# Patient Record
Sex: Female | Born: 1945
Health system: Southern US, Community
[De-identification: ages and names within clinical notes are randomized; demographics above are authoritative.]

## PROBLEM LIST (undated history)

## (undated) DIAGNOSIS — F32A Depression, unspecified: Secondary | ICD-10-CM

## (undated) DIAGNOSIS — I341 Nonrheumatic mitral (valve) prolapse: Secondary | ICD-10-CM

## (undated) DIAGNOSIS — E785 Hyperlipidemia, unspecified: Secondary | ICD-10-CM

## (undated) DIAGNOSIS — R011 Cardiac murmur, unspecified: Secondary | ICD-10-CM

## (undated) DIAGNOSIS — M722 Plantar fascial fibromatosis: Secondary | ICD-10-CM

## (undated) DIAGNOSIS — K219 Gastro-esophageal reflux disease without esophagitis: Secondary | ICD-10-CM

## (undated) DIAGNOSIS — M1990 Unspecified osteoarthritis, unspecified site: Secondary | ICD-10-CM

## (undated) DIAGNOSIS — I10 Essential (primary) hypertension: Secondary | ICD-10-CM

## (undated) DIAGNOSIS — I700 Atherosclerosis of aorta: Secondary | ICD-10-CM

## (undated) DIAGNOSIS — F329 Major depressive disorder, single episode, unspecified: Secondary | ICD-10-CM

## (undated) DIAGNOSIS — Z9289 Personal history of other medical treatment: Secondary | ICD-10-CM

## (undated) DIAGNOSIS — K224 Dyskinesia of esophagus: Secondary | ICD-10-CM

## (undated) DIAGNOSIS — M720 Palmar fascial fibromatosis [Dupuytren]: Secondary | ICD-10-CM

## (undated) DIAGNOSIS — G5600 Carpal tunnel syndrome, unspecified upper limb: Secondary | ICD-10-CM

## (undated) DIAGNOSIS — I493 Ventricular premature depolarization: Secondary | ICD-10-CM

## (undated) DIAGNOSIS — F419 Anxiety disorder, unspecified: Secondary | ICD-10-CM

## (undated) HISTORY — PX: ESOPHAGOGASTRODUODENOSCOPY: SHX1529

## (undated) HISTORY — DX: Major depressive disorder, single episode, unspecified: F32.9

## (undated) HISTORY — DX: Plantar fascial fibromatosis: M72.2

## (undated) HISTORY — DX: Palmar fascial fibromatosis (dupuytren): M72.0

## (undated) HISTORY — DX: Ventricular premature depolarization: I49.3

## (undated) HISTORY — DX: Dyskinesia of esophagus: K22.4

## (undated) HISTORY — DX: Personal history of other medical treatment: Z92.89

## (undated) HISTORY — DX: Gastro-esophageal reflux disease without esophagitis: K21.9

## (undated) HISTORY — PX: KNEE SURGERY: SHX244

## (undated) HISTORY — DX: Anxiety disorder, unspecified: F41.9

## (undated) HISTORY — DX: Hyperlipidemia, unspecified: E78.5

## (undated) HISTORY — DX: Depression, unspecified: F32.A

## (undated) HISTORY — DX: Unspecified osteoarthritis, unspecified site: M19.90

## (undated) HISTORY — PX: OTHER SURGICAL HISTORY: SHX169

## (undated) HISTORY — PX: TONSILLECTOMY AND ADENOIDECTOMY: SUR1326

## (undated) SURGERY — EGD (ESOPHAGOGASTRODUODENOSCOPY)
Anesthesia: Moderate Sedation

---

## 1973-06-13 HISTORY — PX: TUBAL LIGATION: SHX77

## 1979-06-14 HISTORY — PX: LUMBAR SPINE SURGERY: SHX701

## 1997-02-11 DIAGNOSIS — Z9289 Personal history of other medical treatment: Secondary | ICD-10-CM

## 1997-02-11 HISTORY — DX: Personal history of other medical treatment: Z92.89

## 1998-10-12 DIAGNOSIS — Z9289 Personal history of other medical treatment: Secondary | ICD-10-CM

## 1998-10-12 HISTORY — DX: Personal history of other medical treatment: Z92.89

## 1998-11-04 ENCOUNTER — Encounter: Payer: Self-pay | Admitting: Neurosurgery

## 1998-11-04 ENCOUNTER — Ambulatory Visit (HOSPITAL_COMMUNITY): Admission: RE | Admit: 1998-11-04 | Discharge: 1998-11-04 | Payer: Self-pay | Admitting: Neurosurgery

## 1999-03-02 HISTORY — PX: OTHER SURGICAL HISTORY: SHX169

## 1999-06-28 ENCOUNTER — Other Ambulatory Visit: Admission: RE | Admit: 1999-06-28 | Discharge: 1999-06-28 | Payer: Self-pay | Admitting: Obstetrics and Gynecology

## 1999-07-15 DIAGNOSIS — M810 Age-related osteoporosis without current pathological fracture: Secondary | ICD-10-CM | POA: Insufficient documentation

## 2001-01-11 DIAGNOSIS — Z8679 Personal history of other diseases of the circulatory system: Secondary | ICD-10-CM | POA: Insufficient documentation

## 2001-01-11 HISTORY — PX: CARDIAC CATHETERIZATION: SHX172

## 2001-02-04 ENCOUNTER — Encounter: Payer: Self-pay | Admitting: Emergency Medicine

## 2001-02-04 ENCOUNTER — Inpatient Hospital Stay (HOSPITAL_COMMUNITY): Admission: EM | Admit: 2001-02-04 | Discharge: 2001-02-05 | Payer: Self-pay | Admitting: Emergency Medicine

## 2002-12-12 ENCOUNTER — Encounter (INDEPENDENT_AMBULATORY_CARE_PROVIDER_SITE_OTHER): Payer: Self-pay | Admitting: Internal Medicine

## 2002-12-23 ENCOUNTER — Other Ambulatory Visit: Admission: RE | Admit: 2002-12-23 | Discharge: 2002-12-23 | Payer: Self-pay | Admitting: Internal Medicine

## 2002-12-23 ENCOUNTER — Encounter (INDEPENDENT_AMBULATORY_CARE_PROVIDER_SITE_OTHER): Payer: Self-pay | Admitting: Internal Medicine

## 2003-01-15 ENCOUNTER — Encounter: Payer: Self-pay | Admitting: Family Medicine

## 2003-01-15 ENCOUNTER — Ambulatory Visit (HOSPITAL_COMMUNITY): Admission: RE | Admit: 2003-01-15 | Discharge: 2003-01-15 | Payer: Self-pay | Admitting: Family Medicine

## 2003-10-31 ENCOUNTER — Ambulatory Visit (HOSPITAL_COMMUNITY): Admission: RE | Admit: 2003-10-31 | Discharge: 2003-10-31 | Payer: Self-pay | Admitting: Gastroenterology

## 2004-01-20 ENCOUNTER — Ambulatory Visit (HOSPITAL_COMMUNITY): Admission: RE | Admit: 2004-01-20 | Discharge: 2004-01-20 | Payer: Self-pay | Admitting: Anesthesiology

## 2004-02-22 ENCOUNTER — Ambulatory Visit (HOSPITAL_COMMUNITY): Admission: RE | Admit: 2004-02-22 | Discharge: 2004-02-22 | Payer: Self-pay | Admitting: Anesthesiology

## 2004-05-24 ENCOUNTER — Ambulatory Visit: Payer: Self-pay | Admitting: Internal Medicine

## 2004-06-13 HISTORY — PX: SPIROMETRY: SHX456

## 2004-06-14 ENCOUNTER — Ambulatory Visit: Payer: Self-pay | Admitting: Family Medicine

## 2004-06-28 ENCOUNTER — Ambulatory Visit: Payer: Self-pay | Admitting: Family Medicine

## 2005-01-13 ENCOUNTER — Inpatient Hospital Stay (HOSPITAL_COMMUNITY): Admission: EM | Admit: 2005-01-13 | Discharge: 2005-01-15 | Payer: Self-pay | Admitting: Emergency Medicine

## 2005-01-27 ENCOUNTER — Inpatient Hospital Stay (HOSPITAL_COMMUNITY): Admission: RE | Admit: 2005-01-27 | Discharge: 2005-01-29 | Payer: Self-pay | Admitting: Orthopedic Surgery

## 2005-03-24 ENCOUNTER — Ambulatory Visit: Payer: Self-pay | Admitting: Family Medicine

## 2005-06-29 ENCOUNTER — Encounter: Admission: RE | Admit: 2005-06-29 | Discharge: 2005-06-29 | Payer: Self-pay | Admitting: Orthopedic Surgery

## 2005-10-11 ENCOUNTER — Ambulatory Visit: Payer: Self-pay | Admitting: Family Medicine

## 2006-03-21 ENCOUNTER — Ambulatory Visit: Payer: Self-pay | Admitting: Family Medicine

## 2006-08-13 ENCOUNTER — Encounter: Admission: RE | Admit: 2006-08-13 | Discharge: 2006-08-13 | Payer: Self-pay | Admitting: Anesthesiology

## 2006-10-18 ENCOUNTER — Encounter (INDEPENDENT_AMBULATORY_CARE_PROVIDER_SITE_OTHER): Payer: Self-pay | Admitting: Internal Medicine

## 2006-10-18 DIAGNOSIS — E7849 Other hyperlipidemia: Secondary | ICD-10-CM | POA: Insufficient documentation

## 2006-10-18 DIAGNOSIS — M7120 Synovial cyst of popliteal space [Baker], unspecified knee: Secondary | ICD-10-CM | POA: Insufficient documentation

## 2006-10-18 DIAGNOSIS — K219 Gastro-esophageal reflux disease without esophagitis: Secondary | ICD-10-CM

## 2006-10-18 DIAGNOSIS — I059 Rheumatic mitral valve disease, unspecified: Secondary | ICD-10-CM | POA: Insufficient documentation

## 2006-10-18 DIAGNOSIS — E785 Hyperlipidemia, unspecified: Secondary | ICD-10-CM | POA: Insufficient documentation

## 2007-02-23 ENCOUNTER — Ambulatory Visit: Payer: Self-pay | Admitting: Family Medicine

## 2007-02-23 DIAGNOSIS — L259 Unspecified contact dermatitis, unspecified cause: Secondary | ICD-10-CM

## 2007-03-22 ENCOUNTER — Encounter (INDEPENDENT_AMBULATORY_CARE_PROVIDER_SITE_OTHER): Payer: Self-pay | Admitting: Internal Medicine

## 2007-03-22 ENCOUNTER — Ambulatory Visit: Payer: Self-pay | Admitting: Family Medicine

## 2007-03-22 DIAGNOSIS — J309 Allergic rhinitis, unspecified: Secondary | ICD-10-CM | POA: Insufficient documentation

## 2007-04-09 ENCOUNTER — Ambulatory Visit: Payer: Self-pay | Admitting: Internal Medicine

## 2007-04-09 ENCOUNTER — Telehealth (INDEPENDENT_AMBULATORY_CARE_PROVIDER_SITE_OTHER): Payer: Self-pay | Admitting: *Deleted

## 2007-06-07 ENCOUNTER — Encounter (INDEPENDENT_AMBULATORY_CARE_PROVIDER_SITE_OTHER): Payer: Self-pay | Admitting: Internal Medicine

## 2007-07-20 ENCOUNTER — Encounter (INDEPENDENT_AMBULATORY_CARE_PROVIDER_SITE_OTHER): Payer: Self-pay | Admitting: Internal Medicine

## 2007-10-12 ENCOUNTER — Telehealth: Payer: Self-pay | Admitting: Family Medicine

## 2007-11-09 ENCOUNTER — Ambulatory Visit: Payer: Self-pay | Admitting: Family Medicine

## 2007-11-09 ENCOUNTER — Ambulatory Visit: Payer: Self-pay | Admitting: Cardiology

## 2007-11-09 LAB — CONVERTED CEMR LAB
Bacteria, UA: 0
Bilirubin Urine: NEGATIVE
Nitrite: NEGATIVE
RBC / HPF: 0
Specific Gravity, Urine: <1.005
WBC Urine, dipstick: NEGATIVE

## 2007-11-12 ENCOUNTER — Telehealth (INDEPENDENT_AMBULATORY_CARE_PROVIDER_SITE_OTHER): Payer: Self-pay | Admitting: Internal Medicine

## 2007-11-12 LAB — CONVERTED CEMR LAB
BUN: 2 mg/dL — ABNORMAL LOW (ref 6–23)
CO2: 30 meq/L (ref 19–32)
Chloride: 102 meq/L (ref 96–112)
Glucose, Bld: 130 mg/dL — ABNORMAL HIGH (ref 70–99)
Potassium: 3.8 meq/L (ref 3.5–5.1)

## 2007-11-22 ENCOUNTER — Encounter (INDEPENDENT_AMBULATORY_CARE_PROVIDER_SITE_OTHER): Payer: Self-pay | Admitting: Internal Medicine

## 2007-12-23 ENCOUNTER — Encounter: Payer: Self-pay | Admitting: Internal Medicine

## 2007-12-27 ENCOUNTER — Telehealth (INDEPENDENT_AMBULATORY_CARE_PROVIDER_SITE_OTHER): Payer: Self-pay | Admitting: Internal Medicine

## 2007-12-27 ENCOUNTER — Encounter (INDEPENDENT_AMBULATORY_CARE_PROVIDER_SITE_OTHER): Payer: Self-pay | Admitting: Internal Medicine

## 2008-03-14 ENCOUNTER — Telehealth (INDEPENDENT_AMBULATORY_CARE_PROVIDER_SITE_OTHER): Payer: Self-pay | Admitting: Internal Medicine

## 2008-03-20 ENCOUNTER — Encounter (INDEPENDENT_AMBULATORY_CARE_PROVIDER_SITE_OTHER): Payer: Self-pay | Admitting: Internal Medicine

## 2008-04-01 ENCOUNTER — Ambulatory Visit: Payer: Self-pay | Admitting: Family Medicine

## 2008-04-01 ENCOUNTER — Encounter (INDEPENDENT_AMBULATORY_CARE_PROVIDER_SITE_OTHER): Payer: Self-pay | Admitting: Internal Medicine

## 2008-04-04 ENCOUNTER — Telehealth (INDEPENDENT_AMBULATORY_CARE_PROVIDER_SITE_OTHER): Payer: Self-pay | Admitting: Internal Medicine

## 2008-04-04 ENCOUNTER — Telehealth: Payer: Self-pay | Admitting: Family Medicine

## 2008-04-06 ENCOUNTER — Emergency Department (HOSPITAL_COMMUNITY): Admission: EM | Admit: 2008-04-06 | Discharge: 2008-04-06 | Payer: Self-pay | Admitting: Emergency Medicine

## 2008-04-16 ENCOUNTER — Ambulatory Visit: Payer: Self-pay | Admitting: Family Medicine

## 2008-04-17 ENCOUNTER — Telehealth: Payer: Self-pay | Admitting: Family Medicine

## 2008-05-14 ENCOUNTER — Ambulatory Visit: Payer: Self-pay | Admitting: Family Medicine

## 2008-05-15 ENCOUNTER — Encounter: Payer: Self-pay | Admitting: Family Medicine

## 2008-07-16 ENCOUNTER — Ambulatory Visit: Payer: Self-pay | Admitting: Family Medicine

## 2008-09-29 ENCOUNTER — Telehealth: Payer: Self-pay | Admitting: Family Medicine

## 2008-10-28 ENCOUNTER — Telehealth: Payer: Self-pay | Admitting: Family Medicine

## 2008-11-11 LAB — HM MAMMOGRAPHY: HM Mammogram: NORMAL

## 2008-11-11 LAB — CONVERTED CEMR LAB
Pap Smear: NORMAL
Pap Smear: NORMAL
Pap Smear: NORMAL
Pap Smear: NORMAL
Pap Smear: NORMAL

## 2008-11-25 ENCOUNTER — Telehealth: Payer: Self-pay | Admitting: Family Medicine

## 2008-12-10 ENCOUNTER — Encounter: Payer: Self-pay | Admitting: Family Medicine

## 2008-12-12 ENCOUNTER — Encounter: Payer: Self-pay | Admitting: Family Medicine

## 2008-12-12 ENCOUNTER — Encounter: Admission: RE | Admit: 2008-12-12 | Discharge: 2008-12-12 | Payer: Self-pay | Admitting: Obstetrics and Gynecology

## 2008-12-24 ENCOUNTER — Telehealth: Payer: Self-pay | Admitting: Family Medicine

## 2008-12-25 ENCOUNTER — Telehealth: Payer: Self-pay | Admitting: Family Medicine

## 2009-01-22 ENCOUNTER — Telehealth: Payer: Self-pay | Admitting: Family Medicine

## 2009-02-17 ENCOUNTER — Telehealth: Payer: Self-pay | Admitting: Family Medicine

## 2009-03-23 ENCOUNTER — Telehealth: Payer: Self-pay | Admitting: Family Medicine

## 2009-04-24 ENCOUNTER — Ambulatory Visit: Payer: Self-pay | Admitting: Family Medicine

## 2009-04-24 LAB — CONVERTED CEMR LAB
ALT: 12 units/L (ref 0–35)
Alkaline Phosphatase: 103 units/L (ref 39–117)
Bilirubin, Direct: 0 mg/dL (ref 0.0–0.3)
CO2: 34 meq/L — ABNORMAL HIGH (ref 19–32)
Calcium: 9.6 mg/dL (ref 8.4–10.5)
Chloride: 98 meq/L (ref 96–112)
LDL Cholesterol: 47 mg/dL (ref 0–99)
Sodium: 141 meq/L (ref 135–145)
Total Bilirubin: 0.6 mg/dL (ref 0.3–1.2)
Total CHOL/HDL Ratio: 2
Total Protein: 7.4 g/dL (ref 6.0–8.3)

## 2009-04-28 ENCOUNTER — Ambulatory Visit: Payer: Self-pay | Admitting: Internal Medicine

## 2009-04-28 ENCOUNTER — Encounter: Payer: Self-pay | Admitting: Family Medicine

## 2009-05-01 ENCOUNTER — Encounter (INDEPENDENT_AMBULATORY_CARE_PROVIDER_SITE_OTHER): Payer: Self-pay | Admitting: *Deleted

## 2009-05-01 ENCOUNTER — Ambulatory Visit: Payer: Self-pay | Admitting: Family Medicine

## 2009-05-01 LAB — CONVERTED CEMR LAB: Fecal Occult Bld: NEGATIVE

## 2009-05-21 ENCOUNTER — Telehealth: Payer: Self-pay | Admitting: Family Medicine

## 2009-05-29 ENCOUNTER — Encounter (INDEPENDENT_AMBULATORY_CARE_PROVIDER_SITE_OTHER): Payer: Self-pay | Admitting: *Deleted

## 2009-06-17 ENCOUNTER — Ambulatory Visit: Payer: Self-pay | Admitting: Family Medicine

## 2009-06-18 ENCOUNTER — Telehealth: Payer: Self-pay | Admitting: Family Medicine

## 2009-07-17 ENCOUNTER — Telehealth: Payer: Self-pay | Admitting: Family Medicine

## 2009-07-30 ENCOUNTER — Encounter: Payer: Self-pay | Admitting: Family Medicine

## 2009-08-13 ENCOUNTER — Telehealth: Payer: Self-pay | Admitting: Family Medicine

## 2009-09-08 ENCOUNTER — Telehealth: Payer: Self-pay | Admitting: Family Medicine

## 2009-10-06 ENCOUNTER — Telehealth: Payer: Self-pay | Admitting: Family Medicine

## 2009-11-04 ENCOUNTER — Telehealth: Payer: Self-pay | Admitting: Family Medicine

## 2009-12-03 ENCOUNTER — Telehealth: Payer: Self-pay | Admitting: Family Medicine

## 2010-01-04 ENCOUNTER — Telehealth (INDEPENDENT_AMBULATORY_CARE_PROVIDER_SITE_OTHER): Payer: Self-pay | Admitting: *Deleted

## 2010-01-09 ENCOUNTER — Emergency Department (HOSPITAL_COMMUNITY): Admission: EM | Admit: 2010-01-09 | Discharge: 2010-01-09 | Payer: Self-pay | Admitting: Emergency Medicine

## 2010-01-09 ENCOUNTER — Ambulatory Visit: Payer: Self-pay | Admitting: Family Medicine

## 2010-01-10 ENCOUNTER — Encounter: Payer: Self-pay | Admitting: Family Medicine

## 2010-01-25 ENCOUNTER — Ambulatory Visit: Payer: Self-pay | Admitting: Family Medicine

## 2010-02-02 ENCOUNTER — Telehealth: Payer: Self-pay | Admitting: Family Medicine

## 2010-03-03 ENCOUNTER — Telehealth: Payer: Self-pay | Admitting: Family Medicine

## 2010-03-23 ENCOUNTER — Ambulatory Visit: Payer: Self-pay | Admitting: Family Medicine

## 2010-03-23 ENCOUNTER — Telehealth (INDEPENDENT_AMBULATORY_CARE_PROVIDER_SITE_OTHER): Payer: Self-pay | Admitting: *Deleted

## 2010-03-29 LAB — CONVERTED CEMR LAB
Albumin: 3.9 g/dL (ref 3.5–5.2)
Alkaline Phosphatase: 82 units/L (ref 39–117)
CO2: 33 meq/L — ABNORMAL HIGH (ref 19–32)
Calcium: 9.1 mg/dL (ref 8.4–10.5)
Creatinine, Ser: 0.8 mg/dL (ref 0.4–1.2)
GFR calc non Af Amer: 73.11 mL/min (ref >60)
Glucose, Bld: 93 mg/dL (ref 70–99)
HDL: 70 mg/dL (ref >39.00)
Sodium: 134 meq/L — ABNORMAL LOW (ref 135–145)
Total Protein: 6.7 g/dL (ref 6.0–8.3)
Triglycerides: 74 mg/dL (ref 0.0–149.0)
VLDL: 14.8 mg/dL (ref 0.0–40.0)

## 2010-04-02 ENCOUNTER — Telehealth: Payer: Self-pay | Admitting: Family Medicine

## 2010-04-06 ENCOUNTER — Ambulatory Visit: Payer: Self-pay | Admitting: Family Medicine

## 2010-04-06 DIAGNOSIS — I10 Essential (primary) hypertension: Secondary | ICD-10-CM | POA: Insufficient documentation

## 2010-04-06 LAB — HM PAP SMEAR

## 2010-04-06 LAB — CONVERTED CEMR LAB

## 2010-04-08 ENCOUNTER — Encounter: Payer: Self-pay | Admitting: Family Medicine

## 2010-04-21 ENCOUNTER — Ambulatory Visit: Payer: Self-pay | Admitting: Family Medicine

## 2010-04-23 ENCOUNTER — Encounter: Payer: Self-pay | Admitting: Family Medicine

## 2010-04-23 LAB — CONVERTED CEMR LAB: Fecal Occult Bld: NEGATIVE

## 2010-04-23 LAB — FECAL OCCULT BLOOD, GUAIAC: Fecal Occult Blood: NEGATIVE

## 2010-05-03 ENCOUNTER — Telehealth: Payer: Self-pay | Admitting: Family Medicine

## 2010-06-01 ENCOUNTER — Telehealth (INDEPENDENT_AMBULATORY_CARE_PROVIDER_SITE_OTHER): Payer: Self-pay | Admitting: *Deleted

## 2010-07-01 ENCOUNTER — Telehealth: Payer: Self-pay | Admitting: Family Medicine

## 2010-07-04 ENCOUNTER — Encounter: Payer: Self-pay | Admitting: Anesthesiology

## 2010-07-04 ENCOUNTER — Encounter: Payer: Self-pay | Admitting: Orthopedic Surgery

## 2010-07-04 ENCOUNTER — Encounter: Payer: Self-pay | Admitting: Gastroenterology

## 2010-07-13 ENCOUNTER — Ambulatory Visit
Admission: RE | Admit: 2010-07-13 | Discharge: 2010-07-13 | Payer: Self-pay | Source: Home / Self Care | Attending: Family Medicine | Admitting: Family Medicine

## 2010-07-13 ENCOUNTER — Telehealth: Payer: Self-pay | Admitting: Family Medicine

## 2010-07-13 DIAGNOSIS — F339 Major depressive disorder, recurrent, unspecified: Secondary | ICD-10-CM | POA: Insufficient documentation

## 2010-07-13 NOTE — Progress Notes (Signed)
----   Converted from flag ---- ---- 03/23/2010 2:17 PM, Kerby Nora MD wrote: CMEt, lipids Dx 272.0  ---- 03/23/2010 11:09 AM, Liane Comber CMA (AAMA) wrote: Pt had labs this am, what would you like ordered? We have blood, we just need to order and send. Thanks Tasha ------------------------------

## 2010-07-13 NOTE — Progress Notes (Signed)
Summary: Rx Omeprazole  Phone Note Refill Request Call back at [phone] Message from:  [facility] on June 18, 2009 3:27 PM  Refills Requested: Medication #1:  OMEPRAZOLE 20 MG  CPDR take 2 capsules twice a day   Last Refilled: 10/28/2008 Received faxed refill request, #[phone] with 3 additional refills called to pharmacy since it could not be sent electronically.   Method Requested: Telephone to Pharmacy Initial call taken by: Linde Gillis CMA Duncan Dull),  June 18, 2009 3:29 PM

## 2010-07-13 NOTE — Progress Notes (Signed)
Summary: refill request for alprazolam  Phone Note Refill Request Message from:  Fax from Pharmacy  Refills Requested: Medication #1:  ALPRAZOLAM 0.25 MG TABS 1 tab by mouth TID   Last Refilled: 12/04/2009 [facility], [phone].  Initial call taken by: Lowella Petties CMA,  January 04, 2010 11:28 AM  Follow-up for Phone Call        Called into [facility] as directed. Janee Morn CMA  January 04, 2010 1:38 PM     Prescriptions: ALPRAZOLAM 0.25 MG TABS (ALPRAZOLAM) 1 tab by mouth TID  #90 x 0   Entered by:   Janee Morn CMA   Authorized by:   Hannah Beat MD   Signed by:   Janee Morn CMA on 01/04/2010   Method used:   Telephoned to ...       [facility] (retail)       [address] [place]       [address]       [place], [place]  [address]       Ph: [phone]       Fax: [phone]   RxID:   [id number] ALPRAZOLAM 0.25 MG TABS (ALPRAZOLAM) 1 tab by mouth TID  #90 x 0   Entered and Authorized by:   Hannah Beat MD   Signed by:   Hannah Beat MD on 01/04/2010   Method used:   Telephoned to ...       [facility] (retail)       [address] [place]       [address]       [place], [place]  [address]       Ph: [phone]       Fax: [phone]   RxID:   [id number]

## 2010-07-13 NOTE — Progress Notes (Signed)
Summary: Rx Alprazolam  Phone Note Refill Request Call back at [phone] Message from:  [facility] on July 17, 2009 9:45 AM  Refills Requested: Medication #1:  ALPRAZOLAM 0.25 MG TABS 1 tab by mouth TID   Last Refilled: 06/17/2009 Received faxed refill request, please advise   Method Requested: Telephone to Pharmacy Initial call taken by: Linde Gillis CMA Duncan Dull),  July 17, 2009 9:45 AM  Follow-up for Phone Call        Rx called to pharmacy Follow-up by: Linde Gillis CMA (AAMA),  July 17, 2009 1:00 PM    Prescriptions: ALPRAZOLAM 0.25 MG TABS (ALPRAZOLAM) 1 tab by mouth TID  #90 x 0   Entered and Authorized by:   Kerby Nora MD   Signed by:   Kerby Nora MD on 07/17/2009   Method used:   Telephoned to ...       [facility] (retail)       [address] [place]       [address]       [place], [place]  [address]       Ph: [phone]       Fax: [phone]   RxID:   [id number]

## 2010-07-13 NOTE — Letter (Signed)
Summary: Medical Evaluation for [facility]  Medical Evaluation for  DSS   Imported By: Maryln Gottron 04/14/2010 11:15:08  _____________________________________________________________________  External Attachment:    Type:   Image     Comment:   External Document

## 2010-07-13 NOTE — Progress Notes (Signed)
Summary: Rx Alprazolam  Phone Note Refill Request Call back at [phone] Message from:  [facility] pharmacy on October 06, 2009 3:16 PM  Refills Requested: Medication #1:  ALPRAZOLAM 0.25 MG TABS 1 tab by mouth TID   Last Refilled: 09/08/2009 Received faxed refill request please advise.   Method Requested: Telephone to Pharmacy Initial call taken by: Linde Gillis CMA Duncan Dull),  October 06, 2009 3:17 PM  Follow-up for Phone Call        Rx called to pharmacy Follow-up by: Benny Lennert CMA Duncan Dull),  October 07, 2009 8:02 AM    Prescriptions: ALPRAZOLAM 0.25 MG TABS (ALPRAZOLAM) 1 tab by mouth TID  #90 x 0   Entered and Authorized by:   Kerby Nora MD   Signed by:   Kerby Nora MD on 10/06/2009   Method used:   Telephoned to ...       [facility] (retail)       [address] [place]       [address]       [place], [place]  [address]       Ph: [phone]       Fax: [phone]   RxID:   [id number]

## 2010-07-13 NOTE — Letter (Signed)
Summary: [facility] Triage Call Report  [facility] Triage Call Report   Imported By: Beau Fanny 01/12/2010 10:57:56  _____________________________________________________________________  External Attachment:    Type:   Image     Comment:   External Document

## 2010-07-13 NOTE — Assessment & Plan Note (Signed)
Summary: F/U DLO   Vital Signs:  Patient profile:   65 year old female Height:      63 inches Weight:      120.6 pounds BMI:     21.44 Temp:     98.9 degrees F oral Pulse rate:   96 / minute Pulse rhythm:   regular BP sitting:   120 / 82  (left arm) Cuff size:   regular  Vitals Entered By: Benny Lennert CMA Duncan Dull) (January 25, 2010 3:09 PM)  History of Present Illness: Chief complaint follow up  Pleasant patient I remember well, several weeks ago, when she was having severe headache and neck pain, fever to 101.  the patient was sent to the ER that time, and ultimately, she went to and had a CT scan of her head, and had a lumbar puncture. Those studies were normal.  At this time, all of her symptoms have resolved, and she is no longer having a headache. She has some mild neck pain only.  REVIEW OF SYSTEMS  GEN: No systemic complaints, no fevers, chills, sweats, or other acute illnesses MSK: Detailed in the HPI GI: tolerating PO intake without difficulty Neuro: No numbness, parasthesias, or tingling associated. Otherwise the pertinent positives of the ROS are noted above.    GEN: WDWN, NAD, Non-toxic, A & O x 3 HEENT: Atraumatic, Normocephalic. Neck supple. No masses, No LAD. Ears and Nose: No external deformity. CV: RRR, No M/G/R. No JVD. No thrill. No extra heart sounds. PULM: CTA B, no wheezes, crackles, rhonchi. No retractions. No resp. distress. No accessory muscle use. EXTR: No c/c/e NEURO: Normal gait.  PSYCH: Normally interactive. Conversant. Not depressed or anxious appearing.  Calm demeanor.    Allergies: 1)  ! Nsaids 2)  ! Asa 3)  ! Codeine 4)  ! * Ambien 5)  ! * Tramadol 6)  ! * Allegra 7)  ! * Latex  Past History:  Past medical, surgical, family and social histories (including risk factors) reviewed, and no changes noted (except as noted below).  Past Medical History: Reviewed history from 10/18/2006 and no changes  required. GERD Hyperlipidemia Osteopenia (07/15/1999) Osteoporosis (07/15/1999)  Past Surgical History: Reviewed history from 12/23/2007 and no changes required.  LUMBAR SURGERY--1981  BENIGN TUMOR, FALLOPIAN TUBR REMOVED--1975  T&A--as a child ABD U/S-- NML, NO GALLSTONES9/19/200  2 D ECHO-- MILD MVP, MILD MR, MILD AORTIC SCLEROSIS--08/1999-- 04/13/2006  DEXA-- Darrall Dears, SPINE AND HIP--07/1999 SPIROMETRY-- NML--06/2004  COLONOSCOPY-- NML-- EGD-- NML--11/2003 CARDIAC CATH--01/2001  MRI CERVICAL SPIN-- DR DEATON--9/98  MRI L/S SPINE,-- DR DEATON--10/1998  EGD-- [facility] --07/1995--11/2003 NEG Dipyridamole strss test--08/08/07--nl Ef 62%  Family History: Reviewed history from 11/22/2007 and no changes required. Father: died age 55- throat cancer Mother: died age 55- breast cancer Siblings: 1 sister, 2 brothers  Social History: Reviewed history from 11/22/2007 and no changes required. Marital Status: Married Children: 3 Occupation: nurse's aid   Impression & Recommendations:  Problem # 1:  NECK PAIN, ACUTE (ICD-723.1) given the fever 101, really do not think it is purely bone and joint pathology to cause this acute pain. Suspect a viral cause, and gargles, the patient is better at this point.  Encouraged her to do some McKenzie protocol neck exercises and range of motion and stretching as well as isometric strengthening.  The following medications were removed from the medication list:    Darvocet-n 100 100-650 Mg Tabs (Propoxyphene n-apap) .[name]... 1 q 8 prn Her updated medication list for this problem includes:  Robaxin-750 Tabs (Methocarbamol tabs) .[name]... 1 tid  Problem # 2:  HEADACHE (ICD-784.0)  The following medications were removed from the medication list:    Darvocet-n 100 100-650 Mg Tabs (Propoxyphene n-apap) .[name]... 1 q 8 prn Her updated medication list for this problem includes:    Toprol Xl 50 Mg Tb24 (Metoprolol succinate) .[name]... 1-1/2 once daily  Complete  Medication List: 1)  Toprol Xl 50 Mg Tb24 (Metoprolol succinate) .[name].. 1-1/2 once daily 2)  Robaxin-750 Tabs (Methocarbamol tabs) .[name].. 1 tid 3)  Alprazolam 0.25 Mg Tabs (Alprazolam) .[name].. 1 tab by mouth tid 4)  Zyrtec Allergy 10 Mg Tabs (Cetirizine hcl) .... Take 1 tablet by mouth once a day 5)  Omeprazole 20 Mg Cpdr (Omeprazole) .... Take 2 capsules twice a day 6)  Paroxetine Hcl 40 Mg Tabs (Paroxetine hcl) .... Take 1 tablet by mouth once a day 7)  Alendronate Sodium 70 Mg Tabs (Alendronate sodium) .[name].. 1 tab by mouth week  Current Allergies (reviewed today): ! NSAIDS ! ASA ! CODEINE ! * AMBIEN ! * TRAMADOL ! * ALLEGRA ! * LATEX

## 2010-07-13 NOTE — Progress Notes (Signed)
Summary: refill request for alprazolam  Phone Note Refill Request Message from:  Fax from Pharmacy  Refills Requested: Medication #1:  ALPRAZOLAM 0.25 MG TABS 1 tab by mouth TID   Last Refilled: 10/07/2009 [facility]ed [facility], [phone].  Initial call taken by: Lowella Petties CMA,  Nov 04, 2009 12:28 PM  Follow-up for Phone Call        Rx called to pharmacy Follow-up by: Benny Lennert CMA Duncan Dull),  Nov 04, 2009 2:04 PM    Prescriptions: ALPRAZOLAM 0.25 MG TABS (ALPRAZOLAM) 1 tab by mouth TID  #90 x 0   Entered and Authorized by:   Kerby Nora MD   Signed by:   Kerby Nora MD on 11/04/2009   Method used:   Telephoned to ...       [facility] (retail)       [address] [place]       [address]       [place], [place]  [address]       Ph: [phone]       Fax: [phone]   RxID:   [id number]

## 2010-07-13 NOTE — Miscellaneous (Signed)
Summary: PPD   Clinical Lists Changes  Observations: Added new observation of TB PPDRESULT: negative (04/08/2010 14:06) Added new observation of PPD RESULT: < 5mm (04/08/2010 14:06) Added new observation of TB-PPD RDDTE: 04/08/2010 (04/08/2010 14:06)      PPD Results    Date of reading: 04/08/2010    Results: < 5mm    Interpretation: negative

## 2010-07-13 NOTE — Letter (Signed)
Summary: [facility]: Immunoassay Fecal Occult Blood (iFOB) Order Form  [place] [facility]  [address] [place], [place] [address]   Phone: [phone]  Fax: [phone]      [facility]: Immunoassay Fecal Occult Blood (iFOB) Order Form   April 06, 2010 MRN: [id number]   Tiffany [name] 1946-03-06   Physicican Name:____Bedsole_____________________  Diagnosis Code:_________v76.41_________________      Kerby Nora MD

## 2010-07-13 NOTE — Progress Notes (Signed)
Summary: alprazolam   Phone Note Refill Request Message from:  Fax from Pharmacy on May 03, 2010 1:42 PM  Refills Requested: Medication #1:  ALPRAZOLAM 0.25 MG TABS 1 tab by mouth TID   Last Refilled: 04/03/2010 Refill request from [facility] pharmacy. [phone].  Initial call taken by: Melody Comas,  May 03, 2010 1:47 PM  Follow-up for Phone Call        filled.  may call in. Follow-up by: Eustaquio Boyden  MD,  May 04, 2010 4:54 PM  Additional Follow-up for Phone Call Additional follow up Details #1::        [facility]ed to [facility]. Additional Follow-up by: Lowella Petties CMA, AAMA,  May 05, 2010 10:20 AM    Prescriptions: ALPRAZOLAM 0.25 MG TABS (ALPRAZOLAM) 1 tab by mouth TID  #90 x 0   Entered and Authorized by:   Eustaquio Boyden  MD   Signed by:   Eustaquio Boyden  MD on 05/04/2010   Method used:   Telephoned to ...       [facility] (retail)       [address] [place]       [address]       [place], [place]  [address]       Ph: [phone]       Fax: [phone]   RxID:   [id number]

## 2010-07-13 NOTE — Progress Notes (Signed)
Summary: refill request for alprazolam  Phone Note Refill Request Message from:  Fax from Pharmacy  Refills Requested: Medication #1:  ALPRAZOLAM 0.25 MG TABS 1 tab by mouth TID   Last Refilled: 11/04/2009 [facility]ed [facility], [phone].  Initial call taken by: Lowella Petties CMA,  December 03, 2009 8:46 AM  Follow-up for Phone Call        Thursday today -- ok to await Dr. Daphine Deutscher return, controlled. Follow-up by: Hannah Beat MD,  December 03, 2009 8:52 AM  Additional Follow-up for Phone Call Additional follow up Details #1::        Rx called to pharmacy Additional Follow-up by: Benny Lennert CMA Duncan Dull),  December 04, 2009 8:33 AM    Prescriptions: ALPRAZOLAM 0.25 MG TABS (ALPRAZOLAM) 1 tab by mouth TID  #90 x 0   Entered by:   Kerby Nora MD   Authorized by:   Hannah Beat MD   Signed by:   Kerby Nora MD on 12/03/2009   Method used:   Telephoned to ...       [facility] (retail)       [address] [place]       [address]       [place], [place]  [address]       Ph: [phone]       Fax: [phone]   RxID:   [id number]

## 2010-07-13 NOTE — Progress Notes (Signed)
Summary: refill request for alprazolam  Phone Note Refill Request Message from:  Fax from Pharmacy  Refills Requested: Medication #1:  ALPRAZOLAM 0.25 MG TABS 1 tab by mouth TID   Last Refilled: 01-05-1946 F[facility]ed [facility] from [facility], phone [phone].  Initial call taken by: Lowella Petties CMA,  September 08, 2009 9:08 AM    Prescriptions: ALPRAZOLAM 0.25 MG TABS (ALPRAZOLAM) 1 tab by mouth TID  #90 x 0   Entered and Authorized by:   Kerby Nora MD   Signed by:   Kerby Nora MD on 09/08/2009   Method used:   Telephoned to ...       [facility] (retail)       [address] [place]       [address]       [place], [place]  [address]       Ph: [phone]       Fax: [phone]   RxID:   [id number]

## 2010-07-13 NOTE — Progress Notes (Signed)
Summary: refill request for alprazolam  Phone Note Refill Request Message from:  Fax from Pharmacy  Refills Requested: Medication #1:  ALPRAZOLAM 0.25 MG TABS 1 tab by mouth TID   Last Refilled: 01/04/2010 [facility]ed [facility], [phone].  Initial call taken by: Lowella Petties CMA,  February 02, 2010 1:27 PM    Prescriptions: ALPRAZOLAM 0.25 MG TABS (ALPRAZOLAM) 1 tab by mouth TID  #90 x 0   Entered and Authorized by:   Hannah Beat MD   Signed by:   Hannah Beat MD on 02/02/2010   Method used:   Telephoned to ...       [facility] (retail)       [address] [place]       [address]       [place], [place]  [address]       Ph: [phone]       Fax: [phone]   RxID:   [id number]   Appended Document: refill request for alprazolam pharmacy advised.Consuello Masse CMA

## 2010-07-13 NOTE — Letter (Signed)
Summary: [facility]  [facility]   Imported By: Lanelle Bal 08/05/2009 12:58:46  _____________________________________________________________________  External Attachment:    Type:   Image     Comment:   External Document

## 2010-07-13 NOTE — Letter (Signed)
Summary: [facility]  [facility]   Imported By: Lanelle Bal 03/17/2010 12:44:08  _____________________________________________________________________  External Attachment:    Type:   Image     Comment:   External Document

## 2010-07-13 NOTE — Progress Notes (Signed)
Summary: alprazolam  Phone Note Refill Request Message from:  Fax from Pharmacy on April 02, 2010 2:52 PM  Refills Requested: Medication #1:  ALPRAZOLAM 0.25 MG TABS 1 tab by mouth TID   Last Refilled: 03/04/2010 [facility]. [phone]  Initial call taken by: Melody Comas,  April 02, 2010 2:53 PM  Follow-up for Phone Call        Medication phoned to pharmacy.  Follow-up by: Delilah Shan CMA Duncan Dull),  April 02, 2010 5:46 PM    Prescriptions: ALPRAZOLAM 0.25 MG TABS (ALPRAZOLAM) 1 tab by mouth TID  #90 x 0   Entered and Authorized by:   Kerby Nora MD   Signed by:   Kerby Nora MD on 04/02/2010   Method used:   Telephoned to ...       [facility] (retail)       [address] [place]       [address]       [place], [place]  [address]       Ph: [phone]       Fax: [phone]   RxID:   [id number]

## 2010-07-13 NOTE — Progress Notes (Signed)
Summary: alprazolam   Phone Note Refill Request Message from:  Fax from Pharmacy on March 03, 2010 11:22 AM  Refills Requested: Medication #1:  ALPRAZOLAM 0.25 MG TABS 1 tab by mouth TID   Last Refilled: 1946/06/04 [facility], [phone]  Initial call taken by: Melody Comas,  March 03, 2010 11:22 AM  Follow-up for Phone Call        Rx called to pharmacy Follow-up by: Benny Lennert CMA Duncan Dull),  March 04, 2010 9:22 AM    Prescriptions: ALPRAZOLAM 0.25 MG TABS (ALPRAZOLAM) 1 tab by mouth TID  #90 x 0   Entered and Authorized by:   Kerby Nora MD   Signed by:   Kerby Nora MD on 03/03/2010   Method used:   Telephoned to ...       [facility] (retail)       [address] [place]       [address]       [place], [place]  [address]       Ph: [phone]       Fax: [phone]   RxID:   [id number]

## 2010-07-13 NOTE — Progress Notes (Signed)
Summary: Rx Alprazolam  Phone Note Refill Request Call back at [phone] Message from:  [facility] on August 13, 2009 3:17 PM  Refills Requested: Medication #1:  ALPRAZOLAM 0.25 MG TABS 1 tab by mouth TID   Last Refilled: 07/17/2009 Received faxed refill request, please advise   Method Requested: Telephone to Pharmacy Initial call taken by: Linde Gillis CMA Duncan Dull),  August 13, 2009 3:17 PM  Follow-up for Phone Call        Rx called to pharmacy Follow-up by: Benny Lennert CMA Duncan Dull),  August 14, 2009 8:36 AM    Prescriptions: ALPRAZOLAM 0.25 MG TABS (ALPRAZOLAM) 1 tab by mouth TID  #90 x 0   Entered and Authorized by:   Kerby Nora MD   Signed by:   Kerby Nora MD on 08/14/2009   Method used:   Telephoned to ...       [facility] (retail)       [address] [place]       [address]       [place], [place]  [address]       Ph: [phone]       Fax: [phone]   RxID:   [id number]

## 2010-07-13 NOTE — Assessment & Plan Note (Signed)
Summary: CPX/JRR   Vital Signs:  Patient profile:   65 year old female Height:      63 inches Weight:      126.0 pounds BMI:     22.40 Temp:     98.6 degrees F oral Pulse rate:   80 / minute Pulse rhythm:   regular BP sitting:   110 / 80  (left arm) Cuff size:   regular  Vitals Entered By: Benny Lennert CMA Duncan Dull) (April 06, 2010 9:56 AM)  History of Present Illness: Chief complaint cpx  High cholesterol: At goal on no medicaiton  Depression, well controlled on paroxetine.  Using xanax daily three times a day..stable..has been on this long term.   Chronic back pain: Well controlled on three times a day robaxin and hydrocodone.  Lumbar disc disease and stenosis.  Seeing Pain MD (Dr. Vear Clock) every 3-4 months.  GERD: occ still with indigestion after supper every other day. Not well controlled on 2 x 20 mg omeprazole any more..has been on this for years.  HTN, well controlled on toprol  Problems Prior to Update: 1)  Neck Pain, Acute  (ICD-723.1) 2)  Fever Unspecified  (ICD-780.60) 3)  Headache  (ICD-784.0) 4)  Depression  (ICD-311) 5)  Pelvic Pain, Acute  (ICD-789.09) 6)  Allergic Rhinitis  (ICD-477.9) 7)  Examination, Medical Nec, Admn Purposes  (ICD-V70.3) 8)  Dermatitis, Contact, Nos  (ICD-692.9) 9)  Mitral Regurgitation, Mild  (ICD-424.0) 10)  Mitral Valve Prolapse  (ICD-424.0) 11)  Fracture, Tibial Plateau, Left  (ICD-823.00) 12)  Stenosis, Other Spec Site, Foraminal , Numbness Arms  (ICD-724.09) 13)  Baker's Cyst, Left Knee  (ICD-727.51) 14)  Lumbar Disc Disorder, 5 Ruptured, 2 Upper & 3 Lower  (ICD-722.93) 15)  Angina, Hx of  (ICD-V12.50) 16)  Osteoporosis  (ICD-733.00) 17)  Hyperlipidemia  (ICD-272.4) 18)  Gerd  (ICD-530.81)  Current Medications (verified): 1)  Toprol Xl 50 Mg Tb24 (Metoprolol Succinate) .[name].. 1-1/2 Once Daily 2)  Robaxin-750  Tabs (Methocarbamol Tabs) .[name].. 1 Tid 3)  Alprazolam 0.25 Mg Tabs (Alprazolam) .[name].. 1 Tab By Mouth Tid 4)   Zyrtec Allergy 10 Mg  Tabs (Cetirizine Hcl) .... Take 1 Tablet By Mouth Once A Day 5)  Omeprazole 20 Mg  Cpdr (Omeprazole) .... Take 2 Capsules Twice A Day 6)  Paroxetine Hcl 40 Mg Tabs (Paroxetine Hcl) .... Take 1 Tablet By Mouth Once A Day 7)  Alendronate Sodium 70 Mg Tabs (Alendronate Sodium) .[name].. 1 Tab By Mouth Week  Allergies: 1)  ! Nsaids 2)  ! Asa 3)  ! Codeine 4)  ! * Ambien 5)  ! * Tramadol 6)  ! * Allegra 7)  ! * Latex  Past History:  Past medical, surgical, family and social histories (including risk factors) reviewed, and no changes noted (except as noted below).  Past Medical History: Reviewed history from 10/18/2006 and no changes required. GERD Hyperlipidemia Osteopenia (07/15/1999) Osteoporosis (07/15/1999)  Past Surgical History: Reviewed history from 12/23/2007 and no changes required.  LUMBAR SURGERY--1981  BENIGN TUMOR, FALLOPIAN TUBR REMOVED--1975  T&A--as a child ABD U/S-- NML, NO GALLSTONES9/19/200  2 D ECHO-- MILD MVP, MILD MR, MILD AORTIC SCLEROSIS--08/1999-- 04/13/2006  DEXA-- Darrall Dears, SPINE AND HIP--07/1999 SPIROMETRY-- NML--06/2004  COLONOSCOPY-- NML-- EGD-- NML--11/2003 CARDIAC CATH--01/2001  MRI CERVICAL SPIN-- DR DEATON--9/98  MRI L/S SPINE,-- DR DEATON--10/1998  EGD-- [facility] --07/1995--11/2003 NEG Dipyridamole strss test--08/08/07--nl Ef 62%  Family History: Reviewed history from 11/22/2007 and no changes required. Father: died age 7- throat cancer Mother:  died age 29- breast cancer Siblings: 1 sister, 2 brothers  Social History: Reviewed history from 11/22/2007 and no changes required. Marital Status: Married Children: 3 Occupation: nurse's aid  Review of Systems General:  Denies fatigue and fever. CV:  Denies chest pain or discomfort. Resp:  Denies shortness of breath. GI:  Denies abdominal pain, bloody stools, constipation, and diarrhea. GU:  Denies dysuria. Psych:  Denies anxiety, depression, and suicidal  thoughts/plans.  Physical Exam  General:  modestly ill-appearing female who appears in discomfort. Thin appearing. Eyes:  pupils equal, pupils round, and pupils reactive to light.   Ears:  External ear exam shows no significant lesions or deformities.  Otoscopic examination reveals clear canals, tympanic membranes are intact bilaterally without bulging, retraction, inflammation or discharge. Hearing is grossly normal bilaterally. Nose:  nasal dischargemucosal pallor.   Mouth:  MMM, dentures Neck:  no carotid bruit or thyromegaly no cervical or supraclavicular lymphadenopathy  Lungs:  Normal respiratory effort, chest expands symmetrically. Lungs are clear to auscultation, no crackles or wheezes. Heart:  Normal rate and regular rhythm. S1 and S2 normal without gallop, murmur, click, rub or other extra sounds. Abdomen:  Bowel sounds positive,abdomen soft and non-tender without masses, organomegaly or hernias noted. Pulses:  R and L posterior tibial pulses are full and equal bilaterally  Extremities:  no edema Skin:  Intact without suspicious lesions or rashes Psych:  Cognition and judgment appear intact. Alert and cooperative with normal attention span and concentration. No apparent delusions, illusions, hallucinations   Impression & Recommendations:  Problem # 1:  DEPRESSION (ICD-311) Stable, well controlled. Counsled on trying to decrease xanax if able.  Her updated medication list for this problem includes:    Alprazolam 0.25 Mg Tabs (Alprazolam) .[name]... 1 tab by mouth tid    Paroxetine Hcl 40 Mg Tabs (Paroxetine hcl) .[name]... Take 1 tablet by mouth once a day  Problem # 2:  ESSENTIAL HYPERTENSION, BENIGN (ICD-401.1) Well controlled. Continue current medication.  Sees Cardiologist for MR in 2 months.  Her updated medication list for this problem includes:    Toprol Xl 50 Mg Tb24 (Metoprolol succinate) .[name]... 1-1/2 once daily  Problem # 3:  HYPERLIPIDEMIA (ICD-272.4)  Well controlled.    Labs Reviewed: SGOT: 19 (03/23/2010)   SGPT: 10 (03/23/2010)   HDL:70.00 (03/23/2010), 81.70 (04/24/2009)  LDL:60 (03/23/2010), 47 (04/24/2009)  Chol:145 (03/23/2010), 145 (04/24/2009)  Trig:74.0 (03/23/2010), 83.0 (04/24/2009)  Problem # 4:  GERD (ICD-530.81) Cahnge to nexium. call if not improving. Make dietary and lifestyle changes.  Her updated medication list for this problem includes:    Nexium 40 Mg Cpdr (Esomeprazole magnesium) .[name]... Take 1 tablet by mouth once a day  Complete Medication List: 1)  Toprol Xl 50 Mg Tb24 (Metoprolol succinate) .[name].. 1-1/2 once daily 2)  Robaxin-750 Tabs (Methocarbamol tabs) .[name].. 1 tid 3)  Alprazolam 0.25 Mg Tabs (Alprazolam) .[name].. 1 tab by mouth tid 4)  Zyrtec Allergy 10 Mg Tabs (Cetirizine hcl) .... Take 1 tablet by mouth once a day 5)  Nexium 40 Mg Cpdr (Esomeprazole magnesium) .... Take 1 tablet by mouth once a day 6)  Paroxetine Hcl 40 Mg Tabs (Paroxetine hcl) .... Take 1 tablet by mouth once a day 7)  Alendronate Sodium 70 Mg Tabs (Alendronate sodium) .[name].. 1 tab by mouth week  Other Orders: Influenza Vaccine MCR (47425) TB Skin Test (95638) Admin 1st Vaccine (75643)  Patient Instructions: 1)  Start nexium in place of omeprazole. 2)   Decrease tomatos, citris, caffeine, peppermint in  diet. 3)  Call about mammogram when ready.  4)  Return stool cards for colon cancer screening. 5)  Make sure to make appt with Gynecologist for pelvic exam.  6)  Ask insurance if shingles vaccine is covered. 7)  Please schedule a follow-up appointment in 1 year.  Prescriptions: NEXIUM 40 MG CPDR (ESOMEPRAZOLE MAGNESIUM) Take 1 tablet by mouth once a day  #30 x 5   Entered and Authorized by:   Kerby Nora MD   Signed by:   Kerby Nora MD on 04/06/2010   Method used:   Faxed to ...       [facility] (retail)       [address] [place]       [address]       [place], [place]  [address]       Ph: [phone]       Fax: [phone]   RxID:    [id number] PANTOPRAZOLE SODIUM 40 MG TBEC (PANTOPRAZOLE SODIUM) Take 1 tablet by mouth once a day  #30 x 3   Entered and Authorized by:   Kerby Nora MD   Signed by:   Kerby Nora MD on 04/06/2010   Method used:   Faxed to ...       [facility] (retail)       [address] [place]       [address]       [place], [place]  [address]       Ph: [phone]       Fax: [phone]   RxID:   [id number]    Orders Added: 1)  Influenza Vaccine MCR [00025] 2)  TB Skin Test [86580] 3)  Admin 1st Vaccine [90471] 4)  Est. Patient Level IV [88416]   Immunizations Administered:  Influenza Vaccine # 1:    Vaccine Type: Fluvax MCR    Site: left deltoid    Mfr: GlaxoSmithKline    Dose: 0.5 ml    Route: IM    Given by: Benny Lennert CMA (AAMA)    Exp. Date: 12/11/2010    Lot #: [id number]    VIS given: 01/05/10 version given April 06, 2010.  PPD Skin Test:    Vaccine Type: PPD    Site: left forearm    Mfr: Sanofi Pasteur    Dose: 0.1 ml    Route: ID    Given by: Benny Lennert CMA (AAMA)    Exp. Date: 04/15/2011    Lot #: [id number]  Flu Vaccine Consent Questions:    Do you have a history of severe allergic reactions to this vaccine? no    Any prior history of allergic reactions to egg and/or gelatin? no    Do you have a sensitivity to the preservative Thimersol? no    Do you have a past history of Guillan-Barre Syndrome? no    Do you currently have an acute febrile illness? no    Have you ever had a severe reaction to latex? no    Vaccine information given and explained to patient? yes    Are you currently pregnant? no   Immunizations Administered:  Influenza Vaccine # 1:    Vaccine Type: Fluvax MCR    Site: left deltoid    Mfr: GlaxoSmithKline    Dose: 0.5 ml    Route: IM    Given by: Benny Lennert CMA (AAMA)    Exp. Date: 12/11/2010    Lot #: [id number]    VIS given: 01/05/10 version given April 06, 2010.  PPD Skin Test:    Vaccine Type: PPD    Site:  left forearm    Mfr: Sanofi Pasteur    Dose: 0.1 ml    Route: ID    Given by: Benny Lennert CMA (AAMA)    Exp. Date: 04/15/2011    Lot #: [id number]  Current Allergies (reviewed today): ! NSAIDS ! ASA ! CODEINE ! * AMBIEN ! * TRAMADOL ! * ALLEGRA ! * LATEX   Last PAP:  normal (11/11/2008 10:34:25 AM) PAP Result Date:  04/06/2010 PAP Result:  Sees GYN

## 2010-07-13 NOTE — Assessment & Plan Note (Signed)
Summary: DISCUSS DEXA SCAN RESULTS/CLE   Vital Signs:  Patient profile:   65 year old female Height:      63 inches Weight:      122.8 pounds BMI:     21.83 Temp:     97.7 degrees F oral Pulse rate:   88 / minute Pulse rhythm:   regular BP sitting:   120 / 68  (left arm) Cuff size:   regular  Vitals Entered By: Benny Lennert CMA Duncan Dull) (June 17, 2009 11:53 AM)  History of Present Illness: Chief complaint discuss bone density results    Osteoporosis, severe new diagnosis. Not taking calcium and vit D...walks a lot.    Also here to address acute URI symptoms. Told in past she may have COPD from working in [employer]. Non smoker, second hand smoke in past.   Acute Visit History:      The patient complains of cough and nasal discharge.  These symptoms began 4 days ago.  She denies earache, fever, sinus problems, and sore throat.  Other comments include: No recetn antibiotics. Using OTC mucinex. .        The patient notes wheezing and shortness of breath.  The cough interferes with her sleep.  The character of the cough is described as productive, yellow.        Problems Prior to Update: 1)  Depression  (ICD-311) 2)  Pelvic Pain, Acute  (ICD-789.09) 3)  Allergic Rhinitis  (ICD-477.9) 4)  Examination, Medical Nec, Admn Purposes  (ICD-V70.3) 5)  Dermatitis, Contact, Nos  (ICD-692.9) 6)  Mitral Regurgitation, Mild  (ICD-424.0) 7)  Mitral Valve Prolapse  (ICD-424.0) 8)  Fracture, Tibial Plateau, Left  (ICD-823.00) 9)  Stenosis, Other Spec Site, Foraminal , Numbness Arms  (ICD-724.09) 10)  Baker's Cyst, Left Knee  (ICD-727.51) 11)  Lumbar Disc Disorder, 5 Ruptured, 2 Upper & 3 Lower  (ICD-722.93) 12)  Angina, Hx of  (ICD-V12.50) 13)  Osteoporosis  (ICD-733.00) 14)  Hyperlipidemia  (ICD-272.4) 15)  Gerd  (ICD-530.81)  Current Medications (verified): 1)  Toprol Xl 50 Mg Tb24 (Metoprolol Succinate) .[name].. 1-1/2 Once Daily 2)  Robaxin-750  Tabs (Methocarbamol Tabs) .[name].. 1 Tid 3)   Darvocet-N 100 100-650 Mg Tabs (Propoxyphene N-Apap) .[name].. 1 Q 8 Prn 4)  Alprazolam 0.25 Mg Tabs (Alprazolam) .[name].. 1 Tab By Mouth Tid 5)  Zyrtec Allergy 10 Mg  Tabs (Cetirizine Hcl) .... Take 1 Tablet By Mouth Once A Day 6)  Omeprazole 20 Mg  Cpdr (Omeprazole) .... Take 2 Capsules Twice A Day 7)  Paroxetine Hcl 40 Mg Tabs (Paroxetine Hcl) .... Take 1 Tablet By Mouth Once A Day 8)  Alendronate Sodium 70 Mg Tabs (Alendronate Sodium) .[name].. 1 Tab By Mouth Week 9)  Hydrocodone-Homatropine 5-1.5 Mg/98ml Syrp (Hydrocodone-Homatropine) .[name].. 1 Tap By Mouth At Bedtime As Needed Cough 10)  Azithromycin 250 Mg Tabs (Azithromycin) .... 2 Tab By Mouth X 1 Day, 1 Tab By Mouth Daily  Allergies: 1)  ! Nsaids 2)  ! Asa 3)  ! Codeine 4)  ! * Ambien 5)  ! * Tramadol 6)  ! * Allegra 7)  ! * Latex  Past History:  Past medical, surgical, family and social histories (including risk factors) reviewed, and no changes noted (except as noted below).  Past Medical History: Reviewed history from 10/18/2006 and no changes required. GERD Hyperlipidemia Osteopenia (07/15/1999) Osteoporosis (07/15/1999)  Past Surgical History: Reviewed history from 12/23/2007 and no changes required.  LUMBAR SURGERY--1981  BENIGN TUMOR, FALLOPIAN TUBR  REMOVED--1975  T&A--as a child ABD U/S-- NML, NO GALLSTONES9/19/200  2 D ECHO-- MILD MVP, MILD MR, MILD AORTIC SCLEROSIS--08/1999-- 04/13/2006  DEXA-- Darrall Dears, SPINE AND HIP--07/1999 SPIROMETRY-- NML--06/2004  COLONOSCOPY-- NML-- EGD-- NML--11/2003 CARDIAC CATH--01/2001  MRI CERVICAL SPIN-- DR DEATON--9/98  MRI L/S SPINE,-- DR DEATON--10/1998  EGD-- [facility] --07/1995--11/2003 NEG Dipyridamole strss test--08/08/07--nl Ef 62%  Family History: Reviewed history from 11/22/2007 and no changes required. Father: died age 6- throat cancer Mother: died age 36- breast cancer Siblings: 1 sister, 2 brothers  Social History: Reviewed history from 11/22/2007 and no changes  required. Marital Status: Married Children: 3 Occupation: nurse's aid  Review of Systems General:  Denies fatigue and fever. CV:  Denies chest pain or discomfort. Resp:  Denies coughing up blood. GI:  Denies abdominal pain. GU:  Denies dysuria.  Physical Exam  General:  thin appearing female in AND Head:  no maxillary sinus ttp Eyes:  vision grossly intact, pupils equal, pupils round, and pupils reactive to light.   Ears:  clear fluid B TMS Nose:  nasal discharge, No mucosal pallor.   Mouth:  MMM, pharynx pink and moist.   Neck:  no carotid bruit or thyromegaly no cervical or supraclavicular lymphadenopathy  Lungs:  Normal respiratory effort, chest expands symmetrically. Lungs are clear to auscultation, no crackles or wheezes. Heart:  Normal rate and regular rhythm. S1 and S2 normal without gallop, murmur, click, rub or other extra sounds. Msk:  No deformity or scoliosis noted of thoracic or lumbar spine.     Impression & Recommendations:  Problem # 1:  BRONCHITIS- ACUTE (ICD-466.0) ? COPD history, no acute exac or current wheezing, but sputum change.  Her updated medication list for this problem includes:    Hydrocodone-homatropine 5-1.5 Mg/63ml Syrp (Hydrocodone-homatropine) .[name]... 1 tap by mouth at bedtime as needed cough    Azithromycin 250 Mg Tabs (Azithromycin) .[name]... 2 tab by mouth x 1 day, 1 tab by mouth daily  Take antibiotics and other medications as directed. Encouraged to push clear liquids, get enough rest, and take acetaminophen as needed. Use mucolytic as needed, cough suppressant at night. To be seen in 5-7 days if no improvement, sooner if worse.  Problem # 2:  OSTEOPOROSIS (ICD-733.00) Encouraged two times a day Ca and vit D, weight bearing exercise. Reviewed risks and benefits of bisphosphonates in detail...benifits out weight risks in this pt.  Pt agreeable to initiation of weekly alendronate. Recheck DXA in 2 years.  Her updated medication list for this problem  includes:    Alendronate Sodium 70 Mg Tabs (Alendronate sodium) .[name]... 1 tab by mouth week  Complete Medication List: 1)  Toprol Xl 50 Mg Tb24 (Metoprolol succinate) .[name].. 1-1/2 once daily 2)  Robaxin-750 Tabs (Methocarbamol tabs) .[name].. 1 tid 3)  Darvocet-n 100 100-650 Mg Tabs (Propoxyphene n-apap) .[name].. 1 q 8 prn 4)  Alprazolam 0.25 Mg Tabs (Alprazolam) .[name].. 1 tab by mouth tid 5)  Zyrtec Allergy 10 Mg Tabs (Cetirizine hcl) .... Take 1 tablet by mouth once a day 6)  Omeprazole 20 Mg Cpdr (Omeprazole) .... Take 2 capsules twice a day 7)  Paroxetine Hcl 40 Mg Tabs (Paroxetine hcl) .... Take 1 tablet by mouth once a day 8)  Alendronate Sodium 70 Mg Tabs (Alendronate sodium) .[name].. 1 tab by mouth week 9)  Hydrocodone-homatropine 5-1.5 Mg/63ml Syrp (Hydrocodone-homatropine) .[name].. 1 tap by mouth at bedtime as needed cough 10)  Azithromycin 250 Mg Tabs (Azithromycin) .... 2 tab by mouth x 1 day, 1 tab by  mouth daily  Patient Instructions: 1)  Take calcium +vitamin D twice 600 mg/400 International Units. 2)  Mucinex DM during day, cough suppressant at night. 3)  Take your antibiotic as prescribed until ALL of it is gone, but stop if you develop a rash or swelling and contact our office as soon as possible.  Prescriptions: ALPRAZOLAM 0.25 MG TABS (ALPRAZOLAM) 1 tab by mouth TID  #90 x 0   Entered and Authorized by:   Kerby Nora MD   Signed by:   Kerby Nora MD on 06/17/2009   Method used:   Print then Give to Patient   RxID:   [id number] AZITHROMYCIN 250 MG TABS (AZITHROMYCIN) 2 tab by mouth x 1 day, 1 tab by mouth daily  #6 x 0   Entered and Authorized by:   Kerby Nora MD   Signed by:   Kerby Nora MD on 06/17/2009   Method used:   Faxed to ...       [facility] (retail)       [address] [place]       [address]       [place], [place]  [address]       Ph: [phone]       Fax: [phone]   RxID:   [id number] HYDROCODONE-HOMATROPINE 5-1.5 MG/5ML SYRP (HYDROCODONE-HOMATROPINE) 1 tap  by mouth at bedtime as needed cough  #8 oz x 0   Entered and Authorized by:   Kerby Nora MD   Signed by:   Kerby Nora MD on 06/17/2009   Method used:   Print then Give to Patient   RxID:   [id number] ALENDRONATE SODIUM 70 MG TABS (ALENDRONATE SODIUM) 1 tab by mouth week  #4 x 11   Entered and Authorized by:   Kerby Nora MD   Signed by:   Kerby Nora MD on 06/17/2009   Method used:   Faxed to ...       [facility] (retail)       [address] [place]       [address]       [place], [place]  [address]       Ph: [phone]       Fax: [phone]   RxID:   [id number]   Current Allergies (reviewed today): ! NSAIDS ! ASA ! CODEINE ! * AMBIEN ! * TRAMADOL ! * ALLEGRA ! * LATEX

## 2010-07-13 NOTE — Letter (Signed)
Summary: Results Follow up Letter  [place] [facility]  [address] [place], [place] [address]   Phone: [phone]  Fax: [phone]    04/23/2010 MRN: [id number]  Tiffany [name] [address] [place], [place]  [address]  Dear Ms. Carrico,  The following are the results of your recent test(s):  Test         Result    Pap Smear:        Normal _____  Not Normal _____ Comments: ______________________________________________________ Cholesterol: LDL(Bad cholesterol):         Your goal is less than:         HDL (Good cholesterol):       Your goal is more than: Comments:  ______________________________________________________ Mammogram:        Normal _____  Not Normal _____ Comments:  ___________________________________________________________________ Hemoccult:        Normal __X___  Not normal _______ Comments:  Repeat in one year.  _____________________________________________________________________ Other Tests:    We routinely do not discuss normal results over the telephone.  If you desire a copy of the results, or you have any questions about this information we can discuss them at your next office visit.   Sincerely,      Kerby Nora, MD

## 2010-07-13 NOTE — Assessment & Plan Note (Signed)
Summary: MIGRAINE HA/CLE   Vital Signs:  Patient profile:   65 year old female Weight:      117 pounds Temp:     100.6 degrees F oral Pulse rate:   96 / minute Pulse rhythm:   regular BP sitting:   116 / 70  (right arm) Cuff size:   regular  Vitals Entered By: Lowella Petties CMA (January 09, 2010 9:02 AM) CC: Headache since yesterday   History of Present Illness: 65 year old female:  Headache since yesterday, fever to 100.6 the patient had an acute onset headache, worst headache of her life, posterior and Global that began yesterday afternoon. She woke up in the morning with a fever to 101. She is here to the office today. She complains of severe neck pain, but was not here previously prior to several days before. She's not had any trauma. She does not have a history of chronic spinal disorder or status post surgical intervention of the cervical spine   the patient continues to be febrile. She is tachycardic to 100.  Woke up yesterday and had a really bad headache and had a low grade fever.  Feels like her whole body issore and even in theback of  her head and neck. At this point, the patient can minimally move her neck  Had some severe headaches. Muscle aches and mostly UE -- but centered at the neck. yesterday.  Fever started around nine last night. Has been drinkin g a lot of fluid and aching all over.  no urinary of gi symptoms.  this woke her up from the middle of the night.    Allergies: 1)  ! Nsaids 2)  ! Asa 3)  ! Codeine 4)  ! * Ambien 5)  ! * Tramadol 6)  ! * Allegra 7)  ! * Latex  Past History:  Past medical, surgical, family and social histories (including risk factors) reviewed, and no changes noted (except as noted below).  Past Medical History: Reviewed history from 10/18/2006 and no changes required. GERD Hyperlipidemia Osteopenia (07/15/1999) Osteoporosis (07/15/1999)  Past Surgical History: Reviewed history from 12/23/2007 and no changes  required.  LUMBAR SURGERY--1981  BENIGN TUMOR, FALLOPIAN TUBR REMOVED--1975  T&A--as a child ABD U/S-- NML, NO GALLSTONES9/19/200  2 D ECHO-- MILD MVP, MILD MR, MILD AORTIC SCLEROSIS--08/1999-- 04/13/2006  DEXA-- D[facility]all Dears, SPINE AND HIP--07/1999 SPIROMETRY-- NML--06/2004  COLONOSCOPY-- NML-- EGD-- NML--11/2003 CARDIAC CATH--01/2001  MRI CERVICAL SPIN-- DR DEATON--9/98  MRI L/S SPINE,-- DR DEATON--10/1998  EGD-- [facility] --07/1995--11/2003 NEG Dipyridamole strss test--08/08/07--nl Ef 62%  Family History: Reviewed history from 11/22/2007 and no changes required. Father: died age 5- throat cancer Mother: died age 49- breast cancer Siblings: 1 sister, 2 brothers  Social History: Reviewed history from 11/22/2007 and no changes required. Marital Status: Married Children: 3 Occupation: nurse's aid  Review of Systems      See HPI       Otherwise, the pertinent positives and negatives are listed above and in the HPI, otherwise a full review of systems has been reviewed and is negative unless noted positive.  General:  Complains of chills, fatigue, fever, and sleep disorder. Eyes:  Complains of light sensitivity; denies blurring. ENT:  Denies ear discharge, sinus pressure, and sore throat. CV:  Denies chest pain or discomfort and shortness of breath with exertion. Resp:  Denies cough. GI:  Denies diarrhea, nausea, and vomiting. GU:  Denies dysuria. MS:  See HPI; Complains of joint pain and stiffness. Derm:  Denies rash.  Neuro:  Complains of headaches; denies inability to speak, poor balance, seizures, sensation of room spinning, and tingling.  Physical Exam  General:  modestly ill-appearing female who appears in discomfort. Thin appearing. Head:  Normocephalic and atraumatic without obvious abnormalities. No apparent alopecia or balding. Eyes:  pupils equal, pupils round, and pupils reactive to light.   Ears:  External ear exam shows no significant lesions or deformities.  Otoscopic  examination reveals clear canals, tympanic membranes are intact bilaterally without bulging, retraction, inflammation or discharge. Hearing is grossly normal bilaterally. Nose:  External nasal examination shows no deformity or inflammation. Nasal mucosa are pink and moist without lesions or exudates. Mouth:  Oral mucosa and oropharynx without lesions or exudates.  Teeth in good repair. Neck:  No deformities, masses, or tenderness noted. Chest Wall:  No deformities, masses, or tenderness noted. Lungs:  Normal respiratory effort, chest expands symmetrically. Lungs are clear to auscultation, no crackles or wheezes. Heart:  Normal rate and regular rhythm. S1 and S2 normal without gallop, murmur, click, rub or other extra sounds. Abdomen:  Bowel sounds positive,abdomen soft and non-tender without masses, organomegaly or hernias noted. Msk:   Patient cries out when I can't palpate her cervical spine, and is minimally able to move this less than 5 in any direction, cries out with pain in my attempt to do this. Extremities:  No clubbing, cyanosis, edema, or deformity noted with normal full range of motion of all joints.   Neurologic:  alert & oriented X3 and gait normal.   Skin:  Intact without suspicious lesions or rashes Cervical Nodes:  No lymphadenopathy noted Psych:   The patient becomes upset, when I ask her to go the emergency room. She is oriented, alert, and normally interactive   Impression & Recommendations:  Problem # 1:  FEVER UNSPECIFIED (ICD-780.60) Assessment New >45 minutes spent in total face to face time with the patient with >50% of time spent in counselling and coordination of care: mostly spent in coordination of care, and attempting to get the patient emergent evaluation. Upon evaluating the patient, with a fever of 101, fever of unknown source, and absolute inability to move her neck, with a very acute onset, I am concerned for potential acute illness, viral versus bacterial  meningitis, encephalitis, cannot rule out cervical spine osteomyelitis, abscess, or other acute process. I explained to the patient that her fever and neck pathology were highly concerning, and I recommended that she have her do an evaluation in the emergency room. I called EMS. The patient refused our transport, and LEFT our office against medical advice.  She ultimately signout with EMS against medical device.  the patient has significant concern, to go discuss and let her family know that she needed further evaluation, and specifically her child with mental retardation.  I followed the case, discussed the case with the emergency room physician, and ultimately noted that she did return to the [facility] long emergency room.  RMSF, Ehrlichiosis, other acute illness should be in differential.  Problem # 2:  NECK PAIN, ACUTE (ICD-723.1) Assessment: New  Her updated medication list for this problem includes:    Robaxin-750 Tabs (Methocarbamol tabs) .[name]... 1 tid    Darvocet-n 100 100-650 Mg Tabs (Propoxyphene n-apap) .[name]... 1 q 8 prn  Problem # 3:  HEADACHE (ICD-784.0) Assessment: New  Her updated medication list for this problem includes:    Toprol Xl 50 Mg Tb24 (Metoprolol succinate) .[name]... 1-1/2 once daily    Darvocet-n 100 100-650 Mg Tabs (  Propoxyphene n-apap) .[name]... 1 q 8 prn  Complete Medication List: 1)  Toprol Xl 50 Mg Tb24 (Metoprolol succinate) .[name].. 1-1/2 once daily 2)  Robaxin-750 Tabs (Methocarbamol tabs) .[name].. 1 tid 3)  Darvocet-n 100 100-650 Mg Tabs (Propoxyphene n-apap) .[name].. 1 q 8 prn 4)  Alprazolam 0.25 Mg Tabs (Alprazolam) .[name].. 1 tab by mouth tid 5)  Zyrtec Allergy 10 Mg Tabs (Cetirizine hcl) .... Take 1 tablet by mouth once a day 6)  Omeprazole 20 Mg Cpdr (Omeprazole) .... Take 2 capsules twice a day 7)  Paroxetine Hcl 40 Mg Tabs (Paroxetine hcl) .... Take 1 tablet by mouth once a day 8)  Alendronate Sodium 70 Mg Tabs (Alendronate sodium) .[name].. 1 tab by mouth week  Patient  Instructions: 1)  Go to [facility] ER. 2)  FEVER, SEVERE NECK PAIN, CLINICAL CONCERN FOR POTENTIAL MENINGIITIS -- I AM CALL ER STAFF AND HOSPITALISTS AND ER NOW 3)  NO SOURCE FOR FEVER, SEVERE NECK AND HEAD PAIN  Prior Medications (reviewed today): TOPROL XL 50 MG TB24 (METOPROLOL SUCCINATE) 1-1/2 once daily ROBAXIN-750  TABS (METHOCARBAMOL TABS) 1 TID DARVOCET-N 100 100-650 MG TABS (PROPOXYPHENE N-APAP) 1 Q 8 PRN ALPRAZOLAM 0.25 MG TABS (ALPRAZOLAM) 1 tab by mouth TID ZYRTEC ALLERGY 10 MG  TABS (CETIRIZINE HCL) Take 1 tablet by mouth once a day OMEPRAZOLE 20 MG  CPDR (OMEPRAZOLE) take 2 capsules twice a day PAROXETINE HCL 40 MG TABS (PAROXETINE HCL) Take 1 tablet by mouth once a day ALENDRONATE SODIUM 70 MG TABS (ALENDRONATE SODIUM) 1 tab by mouth week Current Allergies: ! NSAIDS ! ASA ! CODEINE ! * AMBIEN ! * TRAMADOL ! * ALLEGRA ! * LATEX  Appended Document: MIGRAINE HA/CLE Please call pt to fins out how she is doing and look in ER records to determine result of eval and if follow up needed.. Tell me  or print for my review. THx.  Appended Document: MIGRAINE HA/CLE there isnt any h&P ub computer and no discharge summary.Consuello Masse CMA

## 2010-07-14 ENCOUNTER — Telehealth: Payer: Self-pay | Admitting: Family Medicine

## 2010-07-15 NOTE — Progress Notes (Signed)
Summary: refill request for xanax  Phone Note Refill Request Message from:  Patient  Refills Requested: Medication #1:  ALPRAZOLAM 0.25 MG TABS 1 tab by mouth TID Pt's son passed on "sunday and pt needs refill.  Uses [facility].  Initial call taken by: Laurie Branson CMA, AAMA,  June 01, 2010 9:39 AM  Follow-up for Phone Call        rx called to pharmacy.Heather M Woodard CMA   Follow-up by: Heather Woodard CMA (AAMA),  June 01, 2010 10:17 AM    Prescriptions: ALPRAZOLAM 0.25 MG TABS (ALPRAZOLAM) 1 tab by mouth TID  #90 x 0   Entered and Authorized by:   Spencer Copland MD   Signed by:   Spencer Copland MD on 06/01/2010   Method used:   Telephoned to ...       [facility] (retail)       [address]       [address]       [place], [place]  [address]       Ph: [phone]       Fax: [phone]   RxID:   [id number]

## 2010-07-15 NOTE — Progress Notes (Signed)
Summary: alprazolam   Phone Note Refill Request Message from:  Fax from Pharmacy on July 01, 2010 1:11 PM  Refills Requested: Medication #1:  ALPRAZOLAM 0.25 MG TABS 1 tab by mouth TID   Last Refilled: 06/01/2010 REfill request from [facility]. [phone]  Initial call taken by: Melody Comas,  July 01, 2010 1:11 PM  Follow-up for Phone Call        Rx called to pharmacy Follow-up by: Benny Lennert CMA Duncan Dull),  July 02, 2010 10:40 AM    Prescriptions: ALPRAZOLAM 0.25 MG TABS (ALPRAZOLAM) 1 tab by mouth TID  #90 x 0   Entered and Authorized by:   Kerby Nora MD   Signed by:   Kerby Nora MD on 07/02/2010   Method used:   Telephoned to ...       [facility] (retail)       [address] [place]       [address]       [place], [place]  [address]       Ph: [phone]       Fax: [phone]   RxID:   [id number]

## 2010-07-21 NOTE — Progress Notes (Signed)
Summary: wants higer dose of xanax  Phone Note Call from Patient Call back at Home Phone [phone]   Caller: Patient Call For: Kerby Nora MD Summary of Call: Pt is asking if she can have a higher dose of xanax, she is taking one 3 times a day and just wants to cry all the time.  [facility]ses [facility].  Please let pt know. Initial call taken by: Lowella Petties CMA, AAMA,  July 13, 2010 9:41 AM  Follow-up for Phone Call        Needs appt for med change... work her in soon with me or one of my partners.  Follow-up by: Kerby Nora MD,  July 13, 2010 9:46 AM  Additional Follow-up for Phone Call Additional follow up Details #1::        Patient to see dr Para March today at 3 Additional Follow-up by: Benny Lennert CMA Duncan Dull),  July 13, 2010 9:56 AM

## 2010-07-21 NOTE — Assessment & Plan Note (Signed)
Summary: DEPRESSION/HMW   Vital Signs:  Patient profile:   65 year old female Height:      63 inches Weight:      127.25 pounds BMI:     22.62 Temp:     98.3 degrees F oral Pulse rate:   80 / minute Pulse rhythm:   regular BP sitting:   144 / 88  (left arm) Cuff size:   regular  Vitals Entered By: Delilah Shan CMA  Dull) (July 13, 2010 3:09 PM) CC: Depression   History of Present Illness: Son died 06-18-2023 (prev child had died in distant past) and mood has been much worse since then.  See prev phone note.  Tearful and irritable but no SI/HI.  Xanax helps some.  Continues on paxil.  She is sad about the recent events.  She has cared for he son for decades and now this is not part of her day.  Husband is supportive.    Allergies: 1)  ! Nsaids 2)  ! Asa 3)  ! Codeine 4)  ! * Ambien 5)  ! * Tramadol 6)  ! * Allegra 7)  ! * Latex  Past History:  Social History: Last updated: 07/13/2010 Marital Status: Married Children: 3 Occupation: nurse's aid Both children dead, second child died 06-18-23  Past Medical History: GERD Hyperlipidemia Osteopenia (07/15/1999) Osteoporosis (07/15/1999) Anxiety/depression   Social History: Reviewed history from 11/22/2007 and no changes required. Marital Status: Married Children: 3 Occupation: nurse's aid Both children dead, second child died 06/18/23  Review of Systems       See HPI.  Otherwise negative.    Physical Exam  General:  tearful but able to carry on conversation tremor noted intermittently during conversation.  she can stop the tremor remainder deferred.    Impression & Recommendations:  Problem # 1:  ADJUSTMENT DISORDER WITH ANXIETY (ICD-309.24) Advised to increase the xanax to qid and cont the paxil.  I talked with her about counseling.  She'll call back with update in 2 days, sooner if needed. Contracts for safety.  No SI/HI.  She has church and husband for support.  She is okay for outpatient follow up.  She agrees  with plan.   Complete Medication List: 1)  Toprol Xl 50 Mg Tb24 (Metoprolol succinate) .[name].. 1-1/2 once daily 2)  Robaxin-750 Tabs (Methocarbamol tabs) .[name].. 1 tid 3)  Alprazolam 0.25 Mg Tabs (Alprazolam) .[name].. 1 tab by mouth 4 times a day 4)  Zyrtec Allergy 10 Mg Tabs (Cetirizine hcl) .... Take 1 tablet by mouth once a day 5)  Nexium 40 Mg Cpdr (Esomeprazole magnesium) .... Take 1 tablet by mouth once a day 6)  Paroxetine Hcl 40 Mg Tabs (Paroxetine hcl) .... Take 1 tablet by mouth once a day 7)  Alendronate Sodium 70 Mg Tabs (Alendronate sodium) .[name].. 1 tab by mouth week  Patient Instructions: 1)  Take the xanax 4 times a day and see if that helps.  Keep taking the paxil.  Call me Thursday with an update.     Orders Added: 1)  Est. Patient Level IV [[address]]    Current Allergies (reviewed today): ! NSAIDS ! ASA ! CODEINE ! * AMBIEN ! * TRAMADOL ! * ALLEGRA ! * LATEX

## 2010-07-21 NOTE — Progress Notes (Signed)
  Phone Note Outgoing Call   Summary of Call: I called the patient.  She is doing better today taking xanax qid.  No adverse effect from meds.  I talked with her about her son's illness.  She wanted to know about autopsy results and I think this just hasn't had time to come back yet.  I talked to her about the aspiration PNA- he failed a swallow study in the hospital.  The OSA dx on the death certificate may have been related to the CPAP use in [facility].  She said she felt better knowing those details.  She'll call me with an update on her anxiety next week, sooner if needed.  Initial call taken by: Crawford Givens MD,  July 14, 2010 5:51 PM

## 2010-07-26 ENCOUNTER — Telehealth: Payer: Self-pay | Admitting: Family Medicine

## 2010-07-30 ENCOUNTER — Encounter: Payer: Self-pay | Admitting: Family Medicine

## 2010-08-04 NOTE — Progress Notes (Signed)
Summary: called to report  Phone Note Call from Patient   Caller: Patient Summary of Call: Pt called to report that she is doing a little better on the xanax.  She still has her moments but she is trying.  Trying to eat well, not shaking.  She is asking for a new script with updated directions to be called to [facility].  Initial call taken by: Lowella Petties CMA, AAMA,  July 26, 2010 3:20 PM  Follow-up for Phone Call        please thank patient for the update.  Please call in the new rx and have patient scheduled to see me in 3-4 weeks.  thanks.  Follow-up by: Crawford Givens MD,  July 26, 2010 9:47 PM  Additional Follow-up for Phone Call Additional follow up Details #1::        Patient Advised. Medication phoned to pharmacy. Appointment scheduled  08/24/2010 Additional Follow-up by: Delilah Shan CMA (AAMA),  July 27, 2010 8:32 AM    Prescriptions: ALPRAZOLAM 0.25 MG TABS (ALPRAZOLAM) 1 tab by mouth 4 times a day  #120 x 1   Entered and Authorized by:   Crawford Givens MD   Signed by:   Crawford Givens MD on 07/26/2010   Method used:   Telephoned to ...       [facility] (retail)       [address] [place]       [address]       [place], [place]  [address]       Ph: [phone]       Fax: [phone]   RxID:   [id number]

## 2010-08-24 ENCOUNTER — Encounter: Payer: Self-pay | Admitting: Family Medicine

## 2010-08-24 ENCOUNTER — Ambulatory Visit (INDEPENDENT_AMBULATORY_CARE_PROVIDER_SITE_OTHER): Payer: Medicare Other | Admitting: Family Medicine

## 2010-08-24 DIAGNOSIS — F4322 Adjustment disorder with anxiety: Secondary | ICD-10-CM

## 2010-08-24 NOTE — Letter (Signed)
Summary: [facility]   Imported By: Maryln Gottron 08/19/2010 15:13:39  _____________________________________________________________________  External Attachment:    Type:   Image     Comment:   External Document

## 2010-08-27 ENCOUNTER — Telehealth: Payer: Self-pay | Admitting: Family Medicine

## 2010-08-28 LAB — CSF CELL COUNT WITH DIFFERENTIAL
RBC Count, CSF: 286 /mm3 — ABNORMAL HIGH
Tube #: 1
WBC, CSF: 2 /mm3 (ref 0–5)
WBC, CSF: 2 /mm3 (ref 0–5)

## 2010-08-28 LAB — URINALYSIS, ROUTINE W REFLEX MICROSCOPIC
Bilirubin Urine: NEGATIVE
Glucose, UA: NEGATIVE mg/dL
Nitrite: NEGATIVE
Specific Gravity, Urine: 1.005 (ref 1.005–1.030)
pH: 6 (ref 5.0–8.0)

## 2010-08-28 LAB — GLUCOSE, CSF: Glucose, CSF: 66 mg/dL (ref 43–76)

## 2010-08-28 LAB — DIFFERENTIAL
Basophils Relative: 0 % (ref 0–1)
Eosinophils Absolute: 0 10*3/uL (ref 0.0–0.7)
Eosinophils Relative: 0 % (ref 0–5)
Monocytes Absolute: 0.7 10*3/uL (ref 0.1–1.0)
Monocytes Relative: 8 % (ref 3–12)

## 2010-08-28 LAB — URINE CULTURE: Colony Count: 4000

## 2010-08-28 LAB — BASIC METABOLIC PANEL
BUN: 2 mg/dL — ABNORMAL LOW (ref 6–23)
CO2: 30 mEq/L (ref 19–32)
Calcium: 9.4 mg/dL (ref 8.4–10.5)
Glucose, Bld: 108 mg/dL — ABNORMAL HIGH (ref 70–99)
Potassium: 3.6 mEq/L (ref 3.5–5.1)
Sodium: 139 mEq/L (ref 135–145)

## 2010-08-28 LAB — URINE MICROSCOPIC-ADD ON

## 2010-08-28 LAB — GRAM STAIN

## 2010-08-28 LAB — CBC
HCT: 39.3 % (ref 36.0–46.0)
Hemoglobin: 13.6 g/dL (ref 12.0–15.0)
MCH: 32.9 pg (ref 26.0–34.0)
MCHC: 34.5 g/dL (ref 30.0–36.0)
RDW: 13.2 % (ref 11.5–15.5)

## 2010-08-28 LAB — CSF CULTURE W GRAM STAIN

## 2010-08-28 LAB — PROTEIN, CSF: Total  Protein, CSF: 100 mg/dL — ABNORMAL HIGH (ref 15–45)

## 2010-08-31 NOTE — Progress Notes (Signed)
Summary: ? regarding Omeprazole  Phone Note From Pharmacy Call back at [phone]   Caller: [facility] Call For: Dr. Para March  Summary of Call: Received fax from pharmacy stating that insurance will only cover Omeprazole 20mg  two times a day, # 60.  They want to know if they can change to Omeprazole 40mg  two times a day.  Please advise. Initial call taken by: Linde Gillis CMA Talon Witting Dull),  August 27, 2010 10:37 AM  Follow-up for Phone Call        please change this to omeprazole 40mg  by mouth two times a day, #60, 5rf and update the med list.  thanks. Crawford Givens MD  August 27, 2010 1:08 PM     New/Updated Medications: OMEPRAZOLE 40 MG CPDR (OMEPRAZOLE) take one tablet by mouth two times a day Prescriptions: OMEPRAZOLE 40 MG CPDR (OMEPRAZOLE) take one tablet by mouth two times a day  #60 x 5   Entered by:   Linde Gillis CMA (AAMA)   Authorized by:   Crawford Givens MD   Signed by:   Linde Gillis CMA (AAMA) on 08/27/2010   Method used:   Faxed to ...       [facility] (retail)       [address] [place]       [address]       [place], [place]  [address]       Ph: [phone]       Fax: [phone]   RxID:   [id number]   Current Allergies (reviewed today): ! NSAIDS ! ASA ! CODEINE ! * AMBIEN ! * TRAMADOL ! * ALLEGRA ! * LATEX

## 2010-08-31 NOTE — Assessment & Plan Note (Signed)
Summary: Follow up per [facility]   Vital Signs:  Patient profile:   65 year old female Height:      63 inches Weight:      121.25 pounds BMI:     21.56 Temp:     98.6 degrees F oral Pulse rate:   88 / minute Pulse rhythm:   regular BP sitting:   104 / 80  (left arm) Cuff size:   regular  Vitals Entered By: Delilah Shan CMA Chanetta Moosman Dull) (August 24, 2010 11:34 AM) CC: Follow up per [facility]   History of Present Illness: Has follow up with cards for chest pain.    Walking more and getting out of the house.  Anxiety is some better.  Asking about taking a 5th dose of xanax.  She has reviewed the autopsy report.  "I'm getting better, but it's a change."  She is making plans to spend holidays, esp during the first year, away from home.  Husband supportive.   Allergies: 1)  ! Nsaids 2)  ! Asa 3)  ! Codeine 4)  ! * Ambien 5)  ! * Tramadol 6)  ! * Allegra 7)  ! * Latex  Social History: Reviewed history from 07/13/2010 and no changes required. 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  Otherwise negative.    Physical Exam  General:  not tearful, able to carry on conversation tremor not noted during conversation regular rate and rhythm clear to auscultation bilaterally speech fluent and judgement intact  she is able to smile today   Impression & Recommendations:  Problem # 1:  ADJUSTMENT DISORDER WITH ANXIETY (ICD-309.24) Improved.  I told her that I can't imagine a mother loving or caring for a son more than she did.  She appreciated this.  I think she is improving, but this has been a huge strain on her and will continue to be. She'll try taking a 5th dose of xanax in the night as needed and will call back with an update.  she is okay for outpatient follow up.    Complete Medication List: 1)  Toprol Xl 50 Mg Tb24 (Metoprolol succinate) .[name].. 1-1/2 once daily 2)  Robaxin-750 Tabs (Methocarbamol tabs)  .[name].. 1 tid 3)  Alprazolam 0.25 Mg Tabs (Alprazolam) .[name].. 1 tab by mouth 4-5 times a day 4)  Zyrtec Allergy 10 Mg Tabs (Cetirizine hcl) .... Take 1 tablet by mouth once a day 5)  Paroxetine Hcl 40 Mg Tabs (Paroxetine hcl) .... Take 1 tablet by mouth once a day 6)  Alendronate Sodium 70 Mg Tabs (Alendronate sodium) .[name].. 1 tab by mouth week 7)  Omeprazole 20 Mg Cpdr (Omeprazole) .... Take 2 capsules by mouth two times a day  Patient Instructions: 1)  Try taking the xanax in the middle of the night if you need it.  Call me in early April and let me know how you are doing.  We can handle your refill at that point.  Take care.    Prescriptions: OMEPRAZOLE 20 MG CPDR (OMEPRAZOLE) Take 2 capsules by mouth two times a day  #360 x 3   Entered by:   Delilah Shan CMA (AAMA)   Authorized by:   Crawford Givens MD   Signed by:   Delilah Shan CMA (AAMA) on 08/24/2010   Method used:   Faxed to ...       [facility] (retail)  [address]       [address]       [place], [place]  [address]       Ph: [phone]       Fax: [phone]   RxID:   [id number] ZYRTEC ALLERGY 10 MG  TABS (CETIRIZINE HCL) Take 1 tablet by mouth once a day  #90 x 3   Entered by:   Delilah Shan CMA (AAMA)   Authorized by:   Crawford Givens MD   Signed by:   Delilah Shan CMA (AAMA) on 08/24/2010   Method used:   Faxed to ...       [facility] (retail)       [address] [place]       [address]       [place], [place]  [address]       Ph: [phone]       Fax: [phone]   RxID:   [id number]    Orders Added: 1)  Est. Patient Level III [[address]]    Current Allergies (reviewed today): ! NSAIDS ! ASA ! CODEINE ! * AMBIEN ! * TRAMADOL ! * ALLEGRA ! * LATEX

## 2010-09-03 ENCOUNTER — Emergency Department (HOSPITAL_COMMUNITY): Payer: Medicare Other

## 2010-09-03 ENCOUNTER — Inpatient Hospital Stay (HOSPITAL_COMMUNITY)
Admission: EM | Admit: 2010-09-03 | Discharge: 2010-09-07 | DRG: 917 | Disposition: A | Discharge status: Other Acute Inpatient Hospital | Payer: Medicare Other | Attending: Pulmonary Disease | Admitting: Pulmonary Disease

## 2010-09-03 DIAGNOSIS — J9600 Acute respiratory failure, unspecified whether with hypoxia or hypercapnia: Secondary | ICD-10-CM

## 2010-09-03 DIAGNOSIS — E871 Hypo-osmolality and hyponatremia: Secondary | ICD-10-CM | POA: Diagnosis present

## 2010-09-03 DIAGNOSIS — F329 Major depressive disorder, single episode, unspecified: Secondary | ICD-10-CM | POA: Diagnosis present

## 2010-09-03 DIAGNOSIS — T394X2A Poisoning by antirheumatics, not elsewhere classified, intentional self-harm, initial encounter: Secondary | ICD-10-CM | POA: Diagnosis present

## 2010-09-03 DIAGNOSIS — E876 Hypokalemia: Secondary | ICD-10-CM | POA: Diagnosis present

## 2010-09-03 DIAGNOSIS — F1910 Other psychoactive substance abuse, uncomplicated: Secondary | ICD-10-CM

## 2010-09-03 DIAGNOSIS — B965 Pseudomonas (aeruginosa) (mallei) (pseudomallei) as the cause of diseases classified elsewhere: Secondary | ICD-10-CM | POA: Diagnosis present

## 2010-09-03 DIAGNOSIS — F3289 Other specified depressive episodes: Secondary | ICD-10-CM | POA: Diagnosis present

## 2010-09-03 DIAGNOSIS — N390 Urinary tract infection, site not specified: Secondary | ICD-10-CM | POA: Diagnosis present

## 2010-09-03 DIAGNOSIS — E46 Unspecified protein-calorie malnutrition: Secondary | ICD-10-CM | POA: Diagnosis present

## 2010-09-03 DIAGNOSIS — T391X1A Poisoning by 4-Aminophenol derivatives, accidental (unintentional), initial encounter: Secondary | ICD-10-CM

## 2010-09-03 DIAGNOSIS — I498 Other specified cardiac arrhythmias: Secondary | ICD-10-CM | POA: Diagnosis present

## 2010-09-03 DIAGNOSIS — M549 Dorsalgia, unspecified: Secondary | ICD-10-CM | POA: Diagnosis present

## 2010-09-03 LAB — BASIC METABOLIC PANEL
Calcium: 8.1 mg/dL — ABNORMAL LOW (ref 8.4–10.5)
Creatinine, Ser: 0.77 mg/dL (ref 0.4–1.2)
GFR calc Af Amer: >60 mL/min (ref >60)
GFR calc non Af Amer: >60 mL/min (ref >60)

## 2010-09-03 LAB — HEPATIC FUNCTION PANEL
ALT: 8 U/L (ref 0–35)
AST: 17 U/L (ref 0–37)
Albumin: 3.3 g/dL — ABNORMAL LOW (ref 3.5–5.2)
Alkaline Phosphatase: 81 U/L (ref 39–117)
Total Protein: 5.7 g/dL — ABNORMAL LOW (ref 6.0–8.3)

## 2010-09-03 LAB — COMPREHENSIVE METABOLIC PANEL
ALT: 9 U/L (ref 0–35)
Calcium: 7.3 mg/dL — ABNORMAL LOW (ref 8.4–10.5)
GFR calc Af Amer: >60 mL/min (ref >60)
Glucose, Bld: 96 mg/dL (ref 70–99)
Sodium: 130 mEq/L — ABNORMAL LOW (ref 135–145)
Total Protein: 4.8 g/dL — ABNORMAL LOW (ref 6.0–8.3)

## 2010-09-03 LAB — ETHANOL: Alcohol, Ethyl (B): <5 mg/dL (ref 0–10)

## 2010-09-03 LAB — POCT I-STAT 3, ART BLOOD GAS (G3+)
O2 Saturation: 99 %
pCO2 arterial: 40.2 mmHg (ref 35.0–45.0)
pH, Arterial: 7.45 — ABNORMAL HIGH (ref 7.350–7.400)
pO2, Arterial: 113 mmHg — ABNORMAL HIGH (ref 80.0–100.0)

## 2010-09-03 LAB — CBC
Hemoglobin: 11.8 g/dL — ABNORMAL LOW (ref 12.0–15.0)
MCHC: 33.7 g/dL (ref 30.0–36.0)
RDW: 12.8 % (ref 11.5–15.5)
WBC: 5.9 10*3/uL (ref 4.0–10.5)

## 2010-09-03 LAB — DIFFERENTIAL
Basophils Absolute: 0 10*3/uL (ref 0.0–0.1)
Basophils Relative: 1 % (ref 0–1)
Monocytes Absolute: 0.6 10*3/uL (ref 0.1–1.0)
Neutro Abs: 3 10*3/uL (ref 1.7–7.7)

## 2010-09-03 LAB — URINALYSIS, ROUTINE W REFLEX MICROSCOPIC
Glucose, UA: NEGATIVE mg/dL
Protein, ur: NEGATIVE mg/dL
Specific Gravity, Urine: 1.005 (ref 1.005–1.030)
Urobilinogen, UA: 0.2 mg/dL (ref 0.0–1.0)

## 2010-09-03 LAB — MAGNESIUM: Magnesium: 1.6 mg/dL (ref 1.5–2.5)

## 2010-09-03 LAB — RAPID URINE DRUG SCREEN, HOSP PERFORMED
Barbiturates: NOT DETECTED
Opiates: NOT DETECTED

## 2010-09-03 LAB — APTT: aPTT: 21 seconds — ABNORMAL LOW (ref 24–37)

## 2010-09-03 LAB — PROTIME-INR: INR: 1 (ref 0.00–1.49)

## 2010-09-04 ENCOUNTER — Inpatient Hospital Stay (HOSPITAL_COMMUNITY): Payer: Medicare Other

## 2010-09-04 DIAGNOSIS — F329 Major depressive disorder, single episode, unspecified: Secondary | ICD-10-CM

## 2010-09-04 LAB — CBC
Hemoglobin: 11.4 g/dL — ABNORMAL LOW (ref 12.0–15.0)
Platelets: 233 10*3/uL (ref 150–400)
RBC: 3.73 MIL/uL — ABNORMAL LOW (ref 3.87–5.11)
WBC: 5.3 10*3/uL (ref 4.0–10.5)

## 2010-09-04 LAB — COMPREHENSIVE METABOLIC PANEL
BUN: 5 mg/dL — ABNORMAL LOW (ref 6–23)
Calcium: 8.1 mg/dL — ABNORMAL LOW (ref 8.4–10.5)
Creatinine, Ser: 0.69 mg/dL (ref 0.4–1.2)
GFR calc non Af Amer: >60 mL/min (ref >60)
Glucose, Bld: 82 mg/dL (ref 70–99)
Sodium: 135 mEq/L (ref 135–145)
Total Protein: 5 g/dL — ABNORMAL LOW (ref 6.0–8.3)

## 2010-09-04 LAB — GLUCOSE, CAPILLARY
Glucose-Capillary: 27 mg/dL — CL (ref 70–99)
Glucose-Capillary: 50 mg/dL — ABNORMAL LOW (ref 70–99)
Glucose-Capillary: 96 mg/dL (ref 70–99)
Glucose-Capillary: 19 mg/dL — CL (ref 70–99)
Glucose-Capillary: 98 mg/dL (ref 70–99)
Glucose-Capillary: 13 mg/dL — CL (ref 70–99)
Glucose-Capillary: 131 mg/dL — ABNORMAL HIGH (ref 70–99)
Glucose-Capillary: 71 mg/dL (ref 70–99)
Glucose-Capillary: 112 mg/dL — ABNORMAL HIGH (ref 70–99)
Glucose-Capillary: 22 mg/dL — CL (ref 70–99)
Glucose-Capillary: 32 mg/dL — CL (ref 70–99)
Glucose-Capillary: 41 mg/dL — CL (ref 70–99)
Glucose-Capillary: 58 mg/dL — ABNORMAL LOW (ref 70–99)

## 2010-09-04 LAB — HEPATIC FUNCTION PANEL
AST: 22 U/L (ref 0–37)
Albumin: 3 g/dL — ABNORMAL LOW (ref 3.5–5.2)
Bilirubin, Direct: <0.1 mg/dL (ref 0.0–0.3)
Total Bilirubin: 0.4 mg/dL (ref 0.3–1.2)

## 2010-09-04 LAB — ACETAMINOPHEN LEVEL: Acetaminophen (Tylenol), Serum: <10 ug/mL — ABNORMAL LOW (ref 10–30)

## 2010-09-04 LAB — POCT I-STAT 3, ART BLOOD GAS (G3+)
Patient temperature: 94.8
TCO2: 26 mmol/L (ref 0–100)
pH, Arterial: 7.448 — ABNORMAL HIGH (ref 7.350–7.400)

## 2010-09-04 LAB — PROTIME-INR
INR: 0.93 (ref 0.00–1.49)
INR: 0.99 (ref 0.00–1.49)
Prothrombin Time: 12.7 seconds (ref 11.6–15.2)
Prothrombin Time: 13.3 seconds (ref 11.6–15.2)

## 2010-09-05 ENCOUNTER — Inpatient Hospital Stay (HOSPITAL_COMMUNITY): Payer: Medicare Other

## 2010-09-05 DIAGNOSIS — R4182 Altered mental status, unspecified: Secondary | ICD-10-CM

## 2010-09-05 DIAGNOSIS — T394X2A Poisoning by antirheumatics, not elsewhere classified, intentional self-harm, initial encounter: Secondary | ICD-10-CM

## 2010-09-05 DIAGNOSIS — T398X2A Poisoning by other nonopioid analgesics and antipyretics, not elsewhere classified, intentional self-harm, initial encounter: Secondary | ICD-10-CM

## 2010-09-05 DIAGNOSIS — F329 Major depressive disorder, single episode, unspecified: Secondary | ICD-10-CM

## 2010-09-05 LAB — PROTIME-INR: INR: 1.07 (ref 0.00–1.49)

## 2010-09-05 LAB — CBC
HCT: 32.7 % — ABNORMAL LOW (ref 36.0–46.0)
MCH: 30.5 pg (ref 26.0–34.0)
MCHC: 33.6 g/dL (ref 30.0–36.0)
RDW: 13.1 % (ref 11.5–15.5)

## 2010-09-05 LAB — PHOSPHORUS: Phosphorus: 4.1 mg/dL (ref 2.3–4.6)

## 2010-09-05 LAB — COMPREHENSIVE METABOLIC PANEL
ALT: 12 U/L (ref 0–35)
Alkaline Phosphatase: 75 U/L (ref 39–117)
BUN: <1 mg/dL — ABNORMAL LOW (ref 6–23)
CO2: 26 mEq/L (ref 19–32)
Chloride: 109 mEq/L (ref 96–112)
GFR calc non Af Amer: >60 mL/min (ref >60)
Glucose, Bld: 95 mg/dL (ref 70–99)
Potassium: 3.4 mEq/L — ABNORMAL LOW (ref 3.5–5.1)
Total Bilirubin: 0.4 mg/dL (ref 0.3–1.2)

## 2010-09-05 LAB — GLUCOSE, CAPILLARY: Glucose-Capillary: 71 mg/dL (ref 70–99)

## 2010-09-05 LAB — MAGNESIUM: Magnesium: 1.8 mg/dL (ref 1.5–2.5)

## 2010-09-05 NOTE — Consult Note (Signed)
NAMEADRAINE, BIFFLE            ACCOUNT NO.:  [id number]  MEDICAL RECORD NO.:  [id number]           PATIENT TYPE:  I  LOCATION:  2114                         FACILITY:  [facility]  PHYSICIAN:  Anselm Jungling, MD  DATE OF BIRTH:  [birth year]  DATE OF CONSULTATION:  09/04/2010 DATE OF DISCHARGE:                                CONSULTATION   IDENTIFYING DATA AND REASON FOR REFERRAL:  This is my first contact with Mrs. Sortino, a 65 year old married Caucasian female, currently under the care of Dr. Delford Field at [facility].  She is being treated for overdose in the intensive care unit.  Psychiatric consultation is requested to assess mental status and make recommendations.  HISTORY OF PRESENTING PROBLEMS:  Historical information is limited at this time.  There is not any previous medical record in E-chart.  The patient is unable to give any history today, and the patient's husband has indicated a reluctance to provide much historical information.  I was able to speak with Dr. Delford Field, and review the physical chart.  The patient's son died in June 06, 2010.  Since then, according to her husband, she has been extremely depressed with "constant crying."  On September 03, 2010, she was found by her husband unconscious, apparently after an overdose of various medications.  She had written a suicide note in which she specified that she wanted no CPR or intubation or other measures to revive her.  Of course, she has been treated, and required intubation initially during her stay here.  She has been extubated, but has been highly agitated, and earlier today was placed on a Precedex protocol.  For that reason, the patient was unable to wake up and speak with me when I came for consultation this afternoon.  No other history is available, although the patient may have a psychiatric history given the fact that her home medications included the antidepressant Paxil, Xanax, and  Klonopin.  Other home medications included alendronate, Prilosec, Zyrtec, oxycodone/Tylenol, and Robaxin.  It is not clear who her primary care physician is.  Urine drug screen was positive for benzodiazepines, consistent with her home medications.  MENTAL STATUS AND OBSERVATIONS:  The patient is a well-nourished, normally-developed adult female in her ICU bed, she appears to be sleeping comfortably.  I spoke her name several times, but was not able to arouse her.  The sitter that is with her in her room informed me that she had been quite agitated earlier in the day, but had just finally settled and gone to sleep after prospects being on at the bedside.  IMPRESSIONS:  Axis I:  Depressive disorder, NOS, bereavement. Axis II:  Deferred. Axis III:  History of mood disorder, NOS, see medical notes, status post overdose. Axis IV:  Stressors severe. Axis V:  GAF 20.  RECOMMENDATIONS:  I spoke with Dr. Delford Field.  I agree with current management.  I will continue to follow the patient during her stay and hopefully be able to speak with her when I return tomorrow.  At that time, aside from assessing mental status, it may be possible to gather more historical information regarding whatever history of  depression or whatever mood disorder she has had.  Her husband is apparently indicated reluctance to give history based on his desire to "respect her wishes."  This suggests the possibility that there may be some ethical considerations and challenges that will rise once the patient is able to converse about her situation.  It appears likely that she will be a candidate for psychiatric treatment in a structured secure setting following her medical clearance.  Thank you for referring this most interesting patient to me.  I will continue to follow.     Anselm Jungling, MD     SPB/MEDQ  D:  09/04/2010  T:  09/04/2010  Job:  [id number]  Electronically Signed by Geralyn Flash MD on  09/05/2010 11:39:14 AM

## 2010-09-06 ENCOUNTER — Telehealth: Payer: Self-pay | Admitting: *Deleted

## 2010-09-06 DIAGNOSIS — T394X2A Poisoning by antirheumatics, not elsewhere classified, intentional self-harm, initial encounter: Secondary | ICD-10-CM

## 2010-09-06 DIAGNOSIS — T391X1A Poisoning by 4-Aminophenol derivatives, accidental (unintentional), initial encounter: Secondary | ICD-10-CM

## 2010-09-06 DIAGNOSIS — T398X2A Poisoning by other nonopioid analgesics and antipyretics, not elsewhere classified, intentional self-harm, initial encounter: Secondary | ICD-10-CM

## 2010-09-06 DIAGNOSIS — F329 Major depressive disorder, single episode, unspecified: Secondary | ICD-10-CM

## 2010-09-06 LAB — GLUCOSE, CAPILLARY
Glucose-Capillary: 14 mg/dL — CL (ref 70–99)
Glucose-Capillary: 116 mg/dL — ABNORMAL HIGH (ref 70–99)

## 2010-09-06 LAB — URINE CULTURE
Colony Count: >=100000
Culture  Setup Time: 201203231635

## 2010-09-06 LAB — COMPREHENSIVE METABOLIC PANEL
ALT: 15 U/L (ref 0–35)
AST: 21 U/L (ref 0–37)
Alkaline Phosphatase: 84 U/L (ref 39–117)
CO2: 28 mEq/L (ref 19–32)
GFR calc Af Amer: >60 mL/min (ref >60)
GFR calc non Af Amer: >60 mL/min (ref >60)
Glucose, Bld: 99 mg/dL (ref 70–99)
Potassium: 3.1 mEq/L — ABNORMAL LOW (ref 3.5–5.1)
Sodium: 141 mEq/L (ref 135–145)

## 2010-09-06 NOTE — Telephone Encounter (Signed)
I called home number and LMOVM.  Please try to call the husband/patient tomorrow and get more information.

## 2010-09-06 NOTE — Telephone Encounter (Signed)
Pt's husband called to report that pt is in the hospital, she tried to overdose. He says that you are to call a doctor there ASAP, that pt is being transferred elsewhere.  He left no other details, I called his home number but didn't get an answer.

## 2010-09-06 NOTE — Telephone Encounter (Signed)
Please see what records you can get from the hospital.  Thanks.

## 2010-09-06 NOTE — Telephone Encounter (Signed)
I have checked e-chart and there are not any notes available on this patient. Not completely sure what hospital patient went to.

## 2010-09-07 ENCOUNTER — Inpatient Hospital Stay (HOSPITAL_COMMUNITY)
Admission: RE | Admit: 2010-09-07 | Discharge: 2010-09-10 | DRG: 885 | Disposition: A | Discharge status: Home / Self Care | Payer: 59 | Source: Ambulatory Visit | Attending: Psychiatry | Admitting: Psychiatry

## 2010-09-07 DIAGNOSIS — M810 Age-related osteoporosis without current pathological fracture: Secondary | ICD-10-CM

## 2010-09-07 DIAGNOSIS — K219 Gastro-esophageal reflux disease without esophagitis: Secondary | ICD-10-CM

## 2010-09-07 DIAGNOSIS — T394X2A Poisoning by antirheumatics, not elsewhere classified, intentional self-harm, initial encounter: Secondary | ICD-10-CM

## 2010-09-07 DIAGNOSIS — J301 Allergic rhinitis due to pollen: Secondary | ICD-10-CM

## 2010-09-07 DIAGNOSIS — F339 Major depressive disorder, recurrent, unspecified: Principal | ICD-10-CM

## 2010-09-07 DIAGNOSIS — F1910 Other psychoactive substance abuse, uncomplicated: Secondary | ICD-10-CM

## 2010-09-07 DIAGNOSIS — M549 Dorsalgia, unspecified: Secondary | ICD-10-CM

## 2010-09-07 DIAGNOSIS — T40601A Poisoning by unspecified narcotics, accidental (unintentional), initial encounter: Secondary | ICD-10-CM

## 2010-09-07 DIAGNOSIS — J9600 Acute respiratory failure, unspecified whether with hypoxia or hypercapnia: Secondary | ICD-10-CM

## 2010-09-08 DIAGNOSIS — F339 Major depressive disorder, recurrent, unspecified: Secondary | ICD-10-CM

## 2010-09-08 DIAGNOSIS — F329 Major depressive disorder, single episode, unspecified: Secondary | ICD-10-CM

## 2010-09-08 NOTE — Telephone Encounter (Signed)
Noted, I'll review the records.

## 2010-09-08 NOTE — Telephone Encounter (Signed)
Husband says she is "over behind [place]".  I asked if it were [place] and he said "no", I then asked if it were [facility] and he said "yes". He said she was in the hospital for a few days, then they sent her to this place for her go get some help.  I will check  for any records that I can get but we may not have access to [facility].

## 2010-09-09 ENCOUNTER — Encounter: Payer: Self-pay | Admitting: Family Medicine

## 2010-09-09 ENCOUNTER — Telehealth: Payer: Self-pay | Admitting: Family Medicine

## 2010-09-09 DIAGNOSIS — R45851 Suicidal ideations: Secondary | ICD-10-CM | POA: Insufficient documentation
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
## 2010-09-09 NOTE — H&P (Signed)
Tiffany, [name] NO.:  [id number]  MEDICAL RECORD NO.:  [id number]           PATIENT TYPE:  I  LOCATION:  0303                          [facility]  [facility]  PHYSICIAN:  Anselm Jungling, MD  DATE OF BIRTH:  Aug 17, 1945  DATE OF ADMISSION:  09/07/2010 DATE OF DISCHARGE:                      PSYCHIATRIC ADMISSION ASSESSMENT   IDENTIFYING DATA:  This is a 65 year old female who was voluntarily admitted on 09/07/2010.  HISTORY OF PRESENT ILLNESS:  The patient is a transfer from the Medical Unit after the patient overdosed on Percocet taking approximately 20 pills.  She was feeling very empty inside, did not care if she lived or died.  She states that she "wanted to block everything out," and if it did not work out, that would have been fine.  She reports recent loss of one of her children, a 46 year old child that had spina bifida, in December 2011, and she was still coping with his death.  She is glad that she survived, and she is very interested in getting counseling for her and her husband.  They are very supportive of each other.  PAST PSYCHIATRIC HISTORY:  First admission to the [facility].  She has been prescribed antidepressants from her family doctor.  SOCIAL HISTORY:  The patient is married, married for 47 years.  She had 3 children total.  She lost 2 sons.  She has a surviving daughter age 76.  FAMILY HISTORY:  None.  ALCOHOL/DRUG HISTORY:  She denies any alcohol or substance use.  Primary care provider is Dr. Crawford Givens.  MEDICAL PROBLEMS:  A history of osteoporosis and ruptured disk.  MEDICATIONS:  Transfer medications include: 1. Fosamax 70 mg 1 tablet weekly. 2. Xanax 0.25 mg q.i.d. 3. Klonopin 0.5 mg q.8 h. p.r.n. anxiety. 4. Soma 750 mg q.8 h. for muscle spasms. 5. Omeprazole 40 mg b.i.d. 6. Paxil 40 mg daily.  DRUG ALLERGIES:  LATEX, ASPIRIN, CODEINE AND MORPHINE SULFATE.  PHYSICAL EXAMINATION:  GENERAL:  This is a  well-nourished, well- developed female.  She denies any physical symptoms. MENTAL STATUS:  She is fully oriented and appears cognitively intact as she provides a good history as to her circumstances and symptoms that surrounded this admission.  She has good insight.  Again, her thought processes are coherent and goal directed.  DIAGNOSES:  AXIS I:  Bereavement.  Rule out major depressive episode. AXIS II:  Deferred. AXIS III:  History of osteoporosis, ruptured disk and status post Tylenol overdose. AXIS IV:  Psychosocial problems related to a son's death. AXIS V:  Current GAF is 35-40.  PLAN:  Our plan is to continue the patient's transfer medications.  We will contact her husband for support and safety concerns.  The patient is interested in going to counseling at hospice.  The case manager will obtain her follow-up.  Her tentative length of stay at this time is 2-4 days.     Landry Corporal, N.P.   ______________________________ Anselm Jungling, MD    JO/MEDQ  D:  09/08/2010  T:  09/08/2010  Job:  [id number]  Electronically Signed by Limmie PatriciaP. on 09/09/2010 09:27:25 AM Electronically Signed  by Geralyn Flash MD on 09/09/2010 03:30:17 PM
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
## 2010-09-28 ENCOUNTER — Ambulatory Visit: Payer: Medicare Other | Admitting: Psychology

## 2010-10-22 ENCOUNTER — Other Ambulatory Visit: Payer: Self-pay | Admitting: *Deleted

## 2010-10-22 MED ORDER — ALPRAZOLAM 0.25 MG PO TABS: 0.2500 mg | ORAL_TABLET | Freq: Four times a day (QID) | ORAL | Status: DC

## 2010-10-22 NOTE — Telephone Encounter (Signed)
Please call in and have pt schedule a follow up appointment with either me or Dr. Ermalene Searing.  Thanks.

## 2010-10-22 NOTE — Telephone Encounter (Signed)
Duplicate

## 2010-10-22 NOTE — Telephone Encounter (Signed)
Medication phoned to pharmacy, called previously with incorrect quantity, corrected to #50.  Patient advised to schedule appointment.

## 2010-10-22 NOTE — Telephone Encounter (Signed)
Medication phoned to pharmacy.  

## 2010-10-29 NOTE — Op Note (Signed)
NAMEABY, GESSEL            ACCOUNT NO.:  [id number]   MEDICAL RECORD NO.:  [id number]          PATIENT TYPE:  INP   LOCATION:  2550                         FACILITY:  [facility]   PHYSICIAN:  Doralee Albino. Carola Frost, M.D. DATE OF BIRTH:  December 18, 1945   DATE OF PROCEDURE:  01/27/2005  DATE OF DISCHARGE:  01/27/2005                                 OPERATIVE REPORT   PREOPERATIVE DIAGNOSES:  1.  Left bicondylar tibial plateau fracture.  2.  Retained spanning external fixator with resolving proximal femur pin      site infection.   POSTOPERATIVE DIAGNOSES:  1.  Left bicondylar tibial plateau fracture.  2.  Retained spanning external fixator with resolving proximal femur pin      site infection.   PROCEDURES:  1.  Open reduction and internal fixation left bicondylar tibial plateau      fracture.  2.  Removal of external fixator under general anesthesia.  3.  I&D of skin, subcutaneous tissue, muscle, and bone with curettage of the      soft tissues and pin sites.   SURGEON:  On Casimiro Needle and Virl Son   ANESTHESIA:  General.   COMPLICATIONS:  None.   TOURNIQUET:  None.   ESTIMATED BLOOD LOSS:  100 cc.   DRAINS:  One medium Hemovac in the anterior compartment.   DISPOSITION:  To PACU.   CONDITION:  Stable.   SUMMARY OF INDICATIONS FOR PROCEDURE:  Ms. Canova is a 65 year old white  female who sustained a crush injury tibial plateau fracture initially  treated with fasciotomies and a spanning external fixation of her fracture.  She underwent delayed closure of her fasciotomies and now presents for  definitive fixation of her fracture.  She was started on antibiotics three  days prior to surgery for a pin site infection involving the proximal femur  and was seen yesterday the day prior to surgery at which time the pin sites  appeared to be resolving the infectious process. There was no gross  purulence, but there was still surrounding erythema and drainage of the  proximal  femoral pin at that time.  She underwent a full discussion of the  risks and benefits of surgery including the possibility of infection (new or  persistent), nerve injury, vessel injury, delayed union, nonunion,  malalignment, arthritis, ligamentous instability and possible need for  further surgery in the event of one of the above.  She also specifically  understood the risk of thromboembolic complications.  She had neither taken  nor informed us that she would not take the Lovenox which had been  prescribed for her preoperatively as prophylaxis.  She stated that several  family members had sustained strokes related to blood thinning medications.  She understood the risks of not taking the medication and made informed  decision not to do so.  I did discuss with her and her family the  possibility of mechanical prophylaxis, and that if we were informed of her  decision we could use other means to assist her.  Again after full  discussion of all the above as well as perioperative complications such as  heart attack and stroke, the patient wished to proceed with surgery.   DESCRIPTION OF PROCEDURE:  Ms. Icenhour was administered 67 Ancef and also  vancomycin preoperatively and then taken to the operating room where her leg  was prepped.  After induction of general anesthesia, the external fixator  was removed including the pins.  Large curettes were then used aggressively  to remove all the soft tissues lining the pin tract at the skin,  subcutaneous, and deep muscle layers, which was fibrinous and then smaller  curettes were used to curette the bone tracts of the fixator pins.  Each pin  site was then thoroughly irrigated with bulb syringe irrigation.  Sterile  dressing was then applied to the thigh wound and these pin sites were  prepped entirely out of the field.  A 10/15 drape was used to seal off this  area and then another full Betadine scrub and paint performed of the lower  extremity.   The tibial pin sites were then covered with a Ray-Tec sponge and  Ioban and kept isolated from the field.   A standard 8 cm lateral parapatellar incision was made, carrying down  through the soft tissues to the retinacular layer which was divided again  lateral to the patellar tendon.  We carried the incision down through the  fascia of the anterior compartment adjacent to the tibia.  This revealed the  displaced distal extent of the depressed lateral plateau fracture. We also  then identified by retracting the tendon the anterior split which extended  back into the joint.  The lamina spreader was placed into this fracture  line.  The Glorious Peach was used to look into the joint over the articular surface  submeniscally after incising the coronary ligament and tagging it for future  repair.  The lateral meniscus was examined as well as the cartilage of the  femur, and both were intact.  There were no significant chondral defects on  the femoral cartilage, and the lateral meniscus remained without significant  degenerative change or acute tear.  It remained attached peripherally.  Consequently, the articular surface was then closed down after removing the  adherent clot with curettage and lavage.  The 0.062 K-wires were used to  achieve provisional fixation and the proper plate selected.  A King tong  clamp was then placed using a fluoroscopic assistance to compress the plate  like a washer against the plateau.  Longitudinal traction was held and  maintained to check alignment.  A lateral view was then obtained after  showing appropriate position of the plate on the tibia. An additional pin  through the plate was secured into the articular segment to hold this  rotation in the lateral plane.  The King tong clamp was then further  tightened and a standard 3/5  cortical screw placed across the subchondral bone over drilling the near cortex.  This further increased compression at  the articular  surface. Additional lock screws were then placed.  A single  standard cortical screw was placed into the proximal shaft segment to  maximally oppose the plate to the bone as well.  Because of her relative  osteopenia and the bicondylar nature of her fracture we then proceeded to  place the remaining lock screws.  Final AP and lateral images showed  appropriate anatomic reduction, condylar width, alignment and appropriate  placement of hardware.  The wounds were then irrigated and the deep layer  closed after placement of a medium Hemovac drain.  Following  closure of the  deep wound, the knee was then examined and noted to be stable to varus and  valgus stress in full extension.  There also was no significant anterior and  posterior instability.  The subcu was closed with 2-0 Vicryl, the skin with  staples and sterile gently compressive dressing was applied and a knee  immobilizer. She will be transitioned into a hinged brace on postoperative  day #1, and we will allow her begin range of motion at that time.   PROGNOSIS:  Ms. Mitchner prognosis is good.  She remains at increased risk  for infection given the prior external fixation but her pin site looked  stable and she has been on antibiotics for over 72 hours in addition to the  perioperative antibiotics.  We will continue her on vancomycin until  discharge at which time we will convert her to [facility] so that she has a  full 10-day course of antibiotics for treatment of that area.  She will be  nonweightbearing for the next two months.  We may initiate some limited  weightbearing at the 6-week mark with full weightbearing  resumed by 3 months.  She is certainly at increased risk for thromboembolic  complications given her decision not to receive pharmaco prophylaxis.  We  will get her both foot pumps or SCDs and compression hose, as well as  encourage early motion to obtain maximal mechanical benefit.      Doralee Albino. Carola Frost, M.D.   Electronically Signed     MHH/MEDQ  D:  01/27/2005  T:  01/27/2005  Job:  [id number]

## 2010-10-29 NOTE — Discharge Summary (Signed)
[place]  Patient:    Tiffany [name], Tiffany [name] Visit Number: [id number] MRN: [id number]          Service Type: MED Location: [facility] 2899 [id number] Attending Physician:  Pamella Pert Dictated by:   Tara Jernejcic, P.A. Adm. Date:  02/04/2001 Disc. Date: 02/05/2001   CC:         Tiffany [name], M.D., [facility]  Richard A. Alanda Amass, M.D.   Discharge Summary  DATE OF BIRTH:  Apr 26, 1946  [facility] Record No. [id number]  ADMISSION DIAGNOSES: 1. Chest pain, rule out myocardial infarction. 2. Mitral valve prolapse. 3. Hiatal hernia. 4. Chronic back problems. 5. Hypertension. 6. Anaphylactoid reaction to aspirin and latex.  DISCHARGE DIAGNOSES: 1. Chest pain, resolved, myocardial infarction ruled out with negative    enzymes. 2. Status post cardiac catheterization revealing entirely normal coronary    arteries and normal left ventricular function.  Noncardiac etiology of    chest pain.  Suspect gastrointestinal. 3. Mitral valve prolapse. 4. Hiatal hernia. 5. Chronic back problems. 6. Hypertension. 7. Anaphylactoid reaction to aspirin and latex.  HISTORY OF PRESENT ILLNESS:  Tiffany [name] is a 65 year old married white female patient employed at [employer].  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  She described radiation of her discomfort to the left shoulder and arm with shortness of breath and diaphoresis, but unknown nausea or vomiting. Patient received three nitroglycerin and morphine x 3 mg.  A 12-lead EKG done at [facility] suggested some ischemic changes but the actual EKG is unavailable at the time of this dictation.  EMS was called and the patient was transferred to [facility] Emergency Room.  She was given a sublingual nitroglycerin and became pain-free after that.  Blood pressure systolic was 118, pulse 80, respirations  20.  EKG showed normal sinus rhythm upon transfer to [facility].  The patient has a history of mitral valve prolapse and hypertension.  In the emergency room EKG revealed normal sinus rhythm with no acute ST or T-wave abnormalities.  The patient was admitted for chest pain, rule out MI.  Will be treated with heparin and Plavix, but no aspirin because of allergy.  Will also use nitropaste.  Will plan cardiac catheterization.  PROCEDURE:  Cardiac catheterization February 05, 2001 by Dr. Jeanella Cara.  COMPLICATIONS:  None.  CONSULTATIONS:  None.  HOSPITAL COURSE:  Tiffany [name] was admitted to [facility] on February 04, 2001 with concern for unstable angina.  She was placed on heparin, nitropaste, and continued on her home medicines.  She was also given Plavix. She has a potential allergy to dye as well as latex and aspirin and she was pre medicated in anticipation for cardiac catheterization with prednisone.  The patient remained stable during her first hospital night stay.  Cardiac enzymes were negative x 3.  Hemoglobin slightly low at 10.9, WBC 5.8, platelets 292,000.  Potassium 3.8, BUN 4, creatinine 0.8.  Chest x-ray showed borderline cardiomegaly, but no acute abnormality.  On February 05, 2001 the patient was taken to the cardiac catheterization laboratory by Dr. Jeanella Cara.  This revealed entirely normal coronary arteries and normal LV function with mitral valve prolapse.  The patient tolerated procedure well.  There were no problems with sheath pull post procedure.  Dr. Jacinto Halim felt that the patient clearly had noncardiac chest pain and may likely be of GI etiology  given history of reflux on chronic Nexium therapy and a slightly low hemoglobin on admission.  Results of Hemoccult stools are not available.  Anemia will need to be worked up as an outpatient by the patients primary care Tiffany [name], Tiffany [name].  The patient will be discharged to home on February 05, 2001 post  catheterization as long as her groin is stable.  Dr. Jacinto Halim used Perclose at the end of the procedure.  She will be given instructions about precautions for Preclose usage and will be asked to not swim or soak in a tub for a week.  She is to keep the bandage on her groin for two days.  She may return to work on February 12, 2001.  FOLLOW-UP:  A followup appointment has been scheduled with Dr. Jacinto Halim for September 11 at 10:50.  She will need to see Tiffany [name] in followup to evaluate atypical noncardiac chest pain and hemoglobin of 10.9.  DISCHARGE MEDICATIONS: 1. Inderal LA 80 mg q.d. 2. Cardizem CD 120 q.d. 3. Nexium 40 mg q.d.  ACTIVITY:  No strenuous activity, lifting more than 5 pounds, or driving for the next two days, then activity as tolerated.  May return to work without restriction February 12, 2001.  DIET:  Low fat, low cholesterol, low salt. Dictated by:   Marya Fossa, P.A. Attending Physician:  Pamella Pert DD:  02/05/01 TD:  02/06/01 Job: [id number]

## 2010-10-29 NOTE — Op Note (Signed)
[name], [name]            ACCOUNT NO.:  [id number]   MEDICAL RECORD NO.:  [id number]          PATIENT TYPE:  INP   LOCATION:  5038                         FACILITY:  [facility]   PHYSICIAN:  Doralee Albino. Carola Frost, M.D. DATE OF BIRTH:  11-12-1945   DATE OF PROCEDURE:  01/14/2005  DATE OF DISCHARGE:                                 OPERATIVE REPORT   PREOPERATIVE DIAGNOSIS:  Open left lateral leg fasciotomy.   POSTOPERATIVE DIAGNOSIS:  Open left lateral leg fasciotomy.   PROCEDURE:  Delayed primary closure of left leg wound, 10 cm.   SURGEON:  Doralee Albino. Carola Frost, M.D.   ATTENDING:  Cecil Cranker, P.A.   ANESTHESIA:  General.   COMPLICATIONS:  None.   BLOOD LOSS:  Scant.   DISPOSITION:  PACU.   CONDITION:  Stable.   BRIEF SUMMARY OF INDICATION FOR PROCEDURE:  Tiffany [name] is a 65-year-  old white female who sustained a left tibial plateau fracture during a crush  injury with associated evidence of compartment syndrome involving the  anterior and lateral compartments.  She underwent spanning external fixation  and four-compartment fasciotomy approximately 24 hours ago.  At that time  the muscle was viable and following release, pressure rapidly improved.  The  posterior medial at this closed acutely and on examination this morning, her  lateral wound noted to be quite soft and capable of closure.  After  discussion of the risks and benefits of surgery including possibility of  need to reopen the closure site or failure to close it, the patient wished  to proceed.   BRIEF DESCRIPTION OF PROCEDURE:  She was administered preoperative  antibiotics, taken to THE operating room, where her left lower extremity was  prepped and draped in the usual sterile fashion.  The wound was cleaned and  the muscle remained pink, contractile and healthy.  One liter of normal  saline lavage was used with low-pressure bulb irrigation and then a layered  closure performed with a 2-0  Vicryl and staples for the skin.  The skin was  not closed under any significant tension.  A gently compressive sterile  dressing was then applied, pin sites redressed, and the equinus contracture  prevention boot applied as well.  The patient was taken to the PACU in  stable condition.   PROGNOSIS:  [name] soft tissue swelling is resolving well and she is  getting progressively more comfortable in her fixture as well.  We  anticipate discharge tomorrow after overnight observation.  We will replete  her potassium, which is on the low borderline area, and also transition her  to oral narcotics in anticipation of discharge.  She will remain nonweightbearing on the left lower extremity and will return  to clinic in approximately eight to 10 days for further evaluation with  planned fixation of her tibia fracture within one to three days of that  follow-up appointment.  She will be on Lovenox for DVT prophylaxis pending  surgery.       MHH/MEDQ  D:  01/14/2005  T:  01/15/2005  Job:  [id number]

## 2010-10-29 NOTE — Cardiovascular Report (Signed)
[place]. [facility]  Patient:    Tiffany [name], Tiffany [name] Visit Number: [id number] MRN: [id number]          Service Type: MED Location: [facility] 2899 16 Attending Physician:  Pamella Pert Proc. Date: 02/05/01 Admit Date:  02/04/2001 Discharge Date: 02/05/2001   CC:         [facility]  Eric Form, M.D.   Cardiac Catheterization  NAME OF PROCEDURES: 1. Selective left ventriculography. 2. Selective left and right coronary arteriography. 3. Right femoral arteriography and closure of the right femoral artery    access site use Perclose.  CARDIOLOGIST:  Delrae Rend, M.D.  INDICATIONS:  Ms. Godown is a 65 year old female who was admitted to the hospital through the emergency room after she was complaining of chest pain, which was suggestive of unstable angina,  She had dynamic EKG changes including T wave inversion noted in V1 through V6 during chest pain, which reverted back to flattening of T waves when the chest pain resolved.  Given her significant symptoms and her dynamic EKG changes she was brought to the cardiac catheterization lab to evaluate her coronary anatomy.  HEMODYNAMIC DATA:  The left ventricular pressures were 106/12 with an end-diastolic pressure of 13 mmHg.  The central aortic pressure was 101/62 with a mean of 78 mmHg.  There was no pressure gradient across the aortic valve.  ANGIOGRAPHIC DATA: Left Ventricle:  The left ventricular systolic function was well preserved. Ejection fraction was estimated at 60%.  There was mitral valve prolapse seen with mild mitral regurgitation.  CORONARY ARTERIOGRAPHY: Right Coronary Artery:  The right coronary artery is a super dominant artery. It gives origin to a very large PLV and Pettit branches.  It gives an aberrant SA nodal branch.  It is diffusely impaired.  Left Main Coronary Artery:  The left main coronary artery is a large vessel. It gives origin to a very small circumflex  artery and a very small intermediate coronary artery.  It also gives origin to a large left anterior descending artery.  Circumflex Artery:  The circumflex artery is a nondominant vessel and is very small.  This is diffusely impaired with _______ in the AV groove.  Intermediate Coronary:  The intermediate coronary artery is a very small vessel.  It supplies a small amount of the myocardium.  It is diffusely impaired.  Left Anterior Descending:  The left anterior descending artery is a large vessel.  It is diffusely impaired at the apex.  It gives origin to a large diagonal-1 vessel, which is also diffusely impaired.  RIGHT FEMORAL ARTERIOGRAPHY:  Revealed good arterial axis site.  IMPRESSIONS: 1. Normal coronary arteries. 2. Normal left ventricular systolic function. 3. Mitral valve prolapse with mild mitral valve regurgitation.  RECOMMENDATIONS:  Based on the cardiac catheterization patient is to be evaluated for noncardiac cause of chest pain.  She does have symptoms suggestive of reflux and these EKG changes may represent esophageal spasm.  TECHNIQUE OF THE PROCEDURE:   Under the usual sterile precautions using 6 French right femoral artery access a 6 [place] multipurpose B2 catheter was advanced into the ascending aorta over a 0.035 mm J wire.  The catheter was gently advanced into the left ventricle and left ventricular pressures were monitored.  Hand contrast injection of the left ventricle was performed in both LAO and RAO projections.  Then the catheter was flushed with saline, pulled back into the ascending aorta and pressure gradient across the aortic valve was monitored.  Then  the right coronary artery and then selectively the left main coronary artery were engaged, and arteriography was performed. After obtaining adequate angiographic views the catheter was pulled out the body in the usual fashion.  Right femoral arteriography was performed through the access sheath.   The access site was then closed using 6 [place] Perclose. Adequate hemostasis was obtained.  Patient was transferred to the floor in stable condition. Attending Physician:  Pamella Pert DD:  02/20/01 TD:  02/20/01 Job: [id number]
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
## 2010-10-29 NOTE — Consult Note (Signed)
[place]  Patient:    Tiffany [name], Tiffany [name] Visit Number: [id number] MRN: [id number]          Service Type: MED Location: [phone] Attending Physician:  Pamella Pert Dictated by:   Delrae Rend, M.D. Proc. Date: 02/04/01 Adm. Date:  02/04/2001   CC:         [facility]  Marlise Eves, M.D.   Consultation Report  PRIMARY ATTENDING PHYSICIAN:  Dr. Marlise Eves.  ATTENDING CARDIOLOGIST:  Dr. Alanda Amass.  CHIEF COMPLAINT:  Chest pain.  IMPRESSION: 1. Chest pain, very suggestive of unstable angina.  The patient had a severe    episode of chest pain around 4 to 4:30 in the morning.  This was associated    with lightheadedness.  The episode was relieved with sublingual    nitroglycerin. 2. Dynamic EKG changes.  EKG during the time of chest pain revealed    T-inversions noted from V1 to V6 which has reverted back to flattening of    T-waves on presentation to the emergency room, where she was chest    pain-free. 3. Hypertension, presently well controlled. 4. Latex and aspirin allergy.  RECOMMENDATIONS: 1. Given her significant symptoms of chest pain and dynamic EKG changes, will    proceed with cardiac catheterization on an elective basis or on an urgent    basis if she develops recurrent chest pain. 2. Will start the patient on IV heparin, continue the beta blockade. 3. Will check a lipid panel and also obtain CPK. 4. Will start the patient on Plavix given her aspirin allergy.  Further recommendations to follow following cardiac catheterization.  The patient has been explained the risks and benefits of cardiac catheterization. The patient works as a Psychologist, sport and exercise in [employer], and she, in fact, vehemently states that she needs cardiac catheterization to exactly know what is going on.  She is willing to ______ the risks and benefits of the procedure.  HISTORY OF PRESENT ILLNESS:  Tiffany [name] is a 65 year old  white female with past medical history of mitral valve prolapse, hiatal hernia, and hypertension, who had been doing well until this morning while she was at the workplace.  Suddenly developed severe chest pressure and chest heaviness.  She states that it was 9-10/10 in intensity.  She told her colleagues that she was having a heart attack, and she sat down.  At that time, she was given sublingual nitroglycerin x 4 and morphine sulfate x 2 intravenously.  This did relieve her chest pain.  She was transferred to the [facility] Emergency Room. She had no nausea, vomiting associated with chest pain.  However, she felt dizzy at that time.  No palpitations.  PAST MEDICAL HISTORY: 1. Mitral valve prolapse. 2. Hypertension.  PAST SURGICAL HISTORY:  Back surgery in 1981.  SOCIAL HISTORY:  She is married, has three children.  She does not smoke.  She does not use any alcohol.  She does not use any illicit drugs.  FAMILY HISTORY:  Father died at the age of 74 with cancer of the throat. Mother died at the age of 31 with breast cancer.  She has one older sister and two younger brothers who have had no premature coronary artery disease.  REVIEW OF SYSTEMS:  She denies any bowel or bladder disturbances at this time. She states that this pain was totally different from her reflux.  She has never had this kind of pain before.  She denies any  shortness of breath. There is no history suggestive of paroxysmal nocturnal dyspnea or orthopnea. There is no history of neurological weaknesses.  Other systems were negative.  HOME MEDICATIONS: 1. Inderal LA, dose not known. 2. Cardizem 120 1 p.o. q.d.  ALLERGIES:  LATEX and ASPIRIN.  She has anaphylactic reaction to both aspirin and latex.  PHYSICAL EXAMINATION:  GENERAL:  Moderately built and nourished.  She is in no acute despair.  VITAL SIGNS:  Temperature 97.2, pulse 82, respirations 14, blood pressure 124/68.  CARDIAC:  S1, S2 normal.  There is  a mid systolic click heard.  There is no murmur appreciated, even on dynamic auscultation.  CHEST:  Bilateral equal entry.  No crackles.  ABDOMEN:  Benign.  Bowel sounds heard in all four quadrants.  EXTREMITIES:  No evidence of edema.  ______ to be 3+ and equal.  There is no carotid bruit, no abdominal bruit, no femoral bruit.  There is no evidence of abdominal aortic aneurysm by physical exam.  LABORATORY DATA:  EKG done on February 04, 2001, at 4:38 a.m. reveals sinus rhythm, positive for old inferior wall myocardial infarction.  There is ST segment depression about 1 mm and T-inversion noted in V1 to V5.  This T-inversion and the ST segments have returned to baseline, and there are nonspecific EKG changes noted on EKG performed on February 04, 2001, at 0543 hours.  Her CPK and troponin x 1 have been negative for myocardial injury, and repeated in three hours negative for myocardial injury.  Electrolytes are within normal limits except for mild hypokalemia. Dictated by:   Delrae Rend, M.D. Attending Physician:  Pamella Pert DD:  02/04/01 TD:  02/05/01 Job: [id number]
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
## 2010-10-29 NOTE — Op Note (Signed)
Tiffany [name]            ACCOUNT NO.:  [id number]   MEDICAL RECORD NO.:  [id number]          PATIENT TYPE:  INP   LOCATION:  1823                         FACILITY:  [facility]   PHYSICIAN:  Doralee Albino. Carola Frost, M.D. DATE OF BIRTH:  [birth year]   DATE OF PROCEDURE:  01/13/2005  DATE OF DISCHARGE:                                 OPERATIVE REPORT   PREOPERATIVE DIAGNOSES:  1.  Left, tibial plateau fracture.  2.  Left leg compartment syndrome.   POSTOPERATIVE DIAGNOSES:  1.  Left, tibial plateau fracture.  2.  Left leg compartment syndrome.   PROCEDURES:  1.  Spanning external fixation of left bicondylar plateau fracture.  2.  Four compartment fasciotomy, left leg.   SURGEON:  Doralee Albino. Carola Frost, M.D.   ASSISTANT:  Cecil Cranker, PA.   ANESTHESIA:  General.   COMPLICATIONS:  None.   DRAINS:  One Hemovac.   ESTIMATED BLOOD LOSS:  Less than 20 mL.   DISPOSITION:  To PACU.   CONDITION:  Stable.   BRIEF SUMMARY OF INDICATION FOR PROCEDURE:  Tiffany [name] is a 65 year old  white female who sustained a crush injury to her left leg and tibia several  hours ago. She has had increasingly severe pain and the sensation that her  leg was going to explode particularly laterally and anteriorly over the last  hour and a half. She denied any numbness or tingling.  She did not have  significant pain with passive toe flexion or extension, but did have  significant pain with ankle motion, particularly plantar flexion. After a  full discussion of the risks and benefits of surgery including the  possibility of nerve injury, vessel injury, infection, need for further  surgery and thromboembolic complications, the patient wished to proceed. Of  note, she also has an anaphylactic reaction history to latex as well as some  other medications.   BRIEF SUMMARY OF PROCEDURE:  Tiffany [name] was administered preoperative  Ancef.  Her left lower extremity was prepped and draped in usual sterile  fashion. No tourniquet was used. Two pins were placed in the femur using a  common clamp and then similarly this was performed in the tibia after making  sure that fluoroscopically we had adequate room for plate fixation of her  tibia without the need overlap with any of the pin sites. A reduction  maneuver was performed and two carbon bars placed.  AP and lateral x-rays  showed fracture out to length and with appropriate alignment of the knee.  The compartments were then palpated and they did seem softer than on  preoperative evaluation. Consequently, I did discuss proceeding with the  Stryker device to decide regarding posterior compartment release, but given  that this device contained latex and her clinical picture preoperatively, we  chose to proceed with four compartment fasciotomy.  The anterior and lateral  muscle compartments were clearly under tension and did protrude immediately  upon complete fascial release using a 10 cm incision centered between the  head and base of the fibula. The intermuscular septum was palpated and again  both anterior and lateral compartments released.  The muscle was viable,  pink and healthy after release. Attention was then turned to the medial side  where a more distally placed incision was made, again 10 cm.  The lateral  fascia was released proximally and distally and then retracted out of the  way while the deep fascia was then incised.  The posterior compartment was  more soft but was also more hemorrhagic.  Again, the muscle was viable and  there was no protuberant muscle on this side as there had been on the  anterolateral side.  Given the fact that there is absolutely no low pressure  and the soft tissue could easily be opposed and she had no injury, we  proceeded with subcutaneous and epidermal skin closure of the medial side.  Wound vac was then placed on the lateral wound with plans to return to the  OR in 24 to 48 hours for repeat  irrigation and probable closure of the  wound. Next, the patient is then taken to PACU in stable condition.   PROGNOSIS:  Tiffany [name] sustained a crush injury and bicondylar plateau  fracture, resulting in depression and split of the lateral plateau as well  as some medial plateau involvement.  She will undergo CAT scan and have a  course of Lovenox before definitive treatment which is anticipated in about  10-14 days after soft tissues have adequately recovered from the injury. She  remains at risk for infection and thromboembolic complications.  Given the  improvement in her soft tissue tension, we anticipate return to the the  operating room for possible closure in the next forty-eight hours.       MHH/MEDQ  D:  01/13/2005  T:  01/14/2005  Job:  [id number]

## 2010-11-02 ENCOUNTER — Other Ambulatory Visit: Payer: Self-pay | Admitting: *Deleted

## 2010-11-02 ENCOUNTER — Ambulatory Visit: Payer: Self-pay | Admitting: Family Medicine

## 2010-11-02 MED ORDER — ALPRAZOLAM 0.25 MG PO TABS: 0.2500 mg | ORAL_TABLET | Freq: Four times a day (QID) | ORAL | Status: DC

## 2010-11-02 NOTE — Telephone Encounter (Signed)
Refills denied.. She should be seeing psychiatrist. Was in behavioral health in 08/2010. They need to fill these meds. If pt not seeing [facility] let me know.

## 2010-11-02 NOTE — Telephone Encounter (Signed)
Patient is not seeing psych. She is going to counselling.

## 2010-11-02 NOTE — Telephone Encounter (Signed)
  Called pt to discuss mood and xanax use. Husband controls medicine. She feels that  mood overall is doing gradually better. No SI. Going to hospice for counsling. Getting out a doing more.  Using xanax four times a day, not able to wean down yet.  She wants to keep follow up with Dr. Para March as opposed to me in June as she has some questions about her deceased sons health care. I do not feel her appt needs to be moved sooner as mood is improving. Following that any follow ups scheduled will be with me to get back on track and she will have CPX in 03/2011 with me.  Will refill xanax to last until appt in June.

## 2010-11-02 NOTE — Telephone Encounter (Signed)
Noted  

## 2010-11-03 NOTE — Telephone Encounter (Signed)
rx called to pharmacy 

## 2010-11-11 ENCOUNTER — Ambulatory Visit: Payer: Self-pay | Admitting: Family Medicine

## 2010-11-11 ENCOUNTER — Emergency Department (HOSPITAL_COMMUNITY)
Admission: EM | Admit: 2010-11-11 | Discharge: 2010-11-11 | Disposition: A | Discharge status: Home / Self Care | Payer: Medicare Other | Attending: Emergency Medicine | Admitting: Emergency Medicine

## 2010-11-11 DIAGNOSIS — M542 Cervicalgia: Secondary | ICD-10-CM | POA: Insufficient documentation

## 2010-11-11 DIAGNOSIS — G8929 Other chronic pain: Secondary | ICD-10-CM | POA: Insufficient documentation

## 2010-11-11 DIAGNOSIS — M549 Dorsalgia, unspecified: Secondary | ICD-10-CM | POA: Insufficient documentation

## 2010-11-11 DIAGNOSIS — I10 Essential (primary) hypertension: Secondary | ICD-10-CM | POA: Insufficient documentation

## 2010-11-13 ENCOUNTER — Encounter: Payer: Self-pay | Admitting: Family Medicine

## 2010-11-22 ENCOUNTER — Encounter: Payer: Self-pay | Admitting: Family Medicine

## 2010-11-22 ENCOUNTER — Ambulatory Visit (INDEPENDENT_AMBULATORY_CARE_PROVIDER_SITE_OTHER): Payer: Medicare Other | Admitting: Family Medicine

## 2010-11-22 DIAGNOSIS — F4322 Adjustment disorder with anxiety: Secondary | ICD-10-CM

## 2010-11-22 MED ORDER — ALPRAZOLAM 0.25 MG PO TABS: 0.2500 mg | ORAL_TABLET | Freq: Four times a day (QID) | ORAL | Status: DC

## 2010-11-22 NOTE — Patient Instructions (Signed)
I want you to call me at the end of the month with an update, sooner if needed.  Don't change your meds except for starting back on the paxil, 1/2 tab a day.  Let me know if you have other concerns in the meantime.  Keep up your work with counseling.  I want to see you back in about 8 weeks. Take care.

## 2010-11-22 NOTE — Assessment & Plan Note (Addendum)
>  25 min spent with face to face with patient >50% counseling.  She contracts for safety and still appears to be making progress.  She'll start back on the SSRI, call back with update, continue BZD and counseling.  No SI/HI.  She is making plans for father's day and upcoming holidays.  Okay for outpatient fu.
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
## 2010-12-24 ENCOUNTER — Telehealth: Payer: Self-pay | Admitting: *Deleted

## 2010-12-24 NOTE — Telephone Encounter (Signed)
Left message for pt to call back  °

## 2010-12-24 NOTE — Telephone Encounter (Signed)
Please thank pt for the update.  I'll see her as planned, sooner if needed.  Thanks.

## 2010-12-24 NOTE — Telephone Encounter (Signed)
Pt called to report that she is doing better with 4 xanax a day, she still has bad times, as expected.

## 2010-12-27 NOTE — Telephone Encounter (Signed)
Advised patient

## 2011-01-17 ENCOUNTER — Encounter: Payer: Self-pay | Admitting: Family Medicine

## 2011-01-17 ENCOUNTER — Ambulatory Visit (INDEPENDENT_AMBULATORY_CARE_PROVIDER_SITE_OTHER): Payer: Medicare Other | Admitting: Family Medicine

## 2011-01-17 DIAGNOSIS — F329 Major depressive disorder, single episode, unspecified: Secondary | ICD-10-CM

## 2011-01-17 MED ORDER — ALPRAZOLAM 0.25 MG PO TABS: 0.2500 mg | ORAL_TABLET | Freq: Four times a day (QID) | ORAL | Status: DC

## 2011-01-17 NOTE — Patient Instructions (Signed)
Don't change your meds.  I want you to come back in 2 months for a 30 min visit.  Take care.  Call me with concerns in the meantime.

## 2011-01-17 NOTE — Progress Notes (Signed)
Depressed Mood:  Sleep decreased/ Insomnia/Early awakening: yes- insomnia Interest decreased in activities: some days Guilt or worthlessness:occ, but improved.  Energy decreased: some Concentration difficulties:no Appetite disturbance:  "Okay" appetite.   Psychomotor retardation/agitation:no Suicidal thoughts: no.  "I'm not going that route."  She's walking, spending time with friends, back at church.  She's quilting. She's completed the hospice counseling.  Some days are better than others.  'Holidays are rough."  She is making plans to be out of town on holidays.    She increased the paxil and it may have helped a little.  Taking xanax qid with relief. No ADE.    PMH and SH reviewed  Meds, vitals, and allergies reviewed.   ROS: See HPI.  Otherwise negative.    NAD Speech fluent Judgement intact Affect wnl NCAT RRR CTAB No tremor

## 2011-01-18 NOTE — Assessment & Plan Note (Addendum)
Overall improved w/o SI/HI and continued on meds.  She is s/p counseling and is able to laugh some today.  She is improved, but still has work to be done.  She is aware of this and plans to try to work through this (admittedly tough) situation.  Okay for outpatient f/u.  She has support at home with husband. >25 min spent with face to face with patient, >50% counseling.

## 2011-02-11 HISTORY — PX: TRANSTHORACIC ECHOCARDIOGRAM: SHX275

## 2011-02-21 ENCOUNTER — Encounter: Payer: Self-pay | Admitting: Family Medicine

## 2011-03-24 ENCOUNTER — Ambulatory Visit (INDEPENDENT_AMBULATORY_CARE_PROVIDER_SITE_OTHER): Payer: Medicare Other | Admitting: Family Medicine

## 2011-03-24 ENCOUNTER — Encounter: Payer: Self-pay | Admitting: Family Medicine

## 2011-03-24 VITALS — BP 110/70 | HR 80 | Temp 98.5°F | Wt 124.1 lb

## 2011-03-24 DIAGNOSIS — Z1231 Encounter for screening mammogram for malignant neoplasm of breast: Secondary | ICD-10-CM

## 2011-03-24 DIAGNOSIS — F4322 Adjustment disorder with anxiety: Secondary | ICD-10-CM

## 2011-03-24 DIAGNOSIS — M810 Age-related osteoporosis without current pathological fracture: Secondary | ICD-10-CM

## 2011-03-24 DIAGNOSIS — K449 Diaphragmatic hernia without obstruction or gangrene: Secondary | ICD-10-CM

## 2011-03-24 DIAGNOSIS — Z1239 Encounter for other screening for malignant neoplasm of breast: Secondary | ICD-10-CM

## 2011-03-24 DIAGNOSIS — M1990 Unspecified osteoarthritis, unspecified site: Secondary | ICD-10-CM

## 2011-03-24 MED ORDER — ALPRAZOLAM 0.25 MG PO TABS: 0.2500 mg | ORAL_TABLET | Freq: Four times a day (QID) | ORAL | Status: DC
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
## 2011-03-24 NOTE — Patient Instructions (Addendum)
Don't change your meds and call me with an update next week.  Take care and let me know if you have concerns. See Shirlee Limerick about your referral before your leave today.

## 2011-03-27 ENCOUNTER — Encounter: Payer: Self-pay | Admitting: Family Medicine

## 2011-03-27 DIAGNOSIS — Z1239 Encounter for other screening for malignant neoplasm of breast: Secondary | ICD-10-CM | POA: Insufficient documentation

## 2011-03-27 DIAGNOSIS — M1990 Unspecified osteoarthritis, unspecified site: Secondary | ICD-10-CM | POA: Insufficient documentation

## 2011-03-27 NOTE — Assessment & Plan Note (Signed)
Refer for dxa.  

## 2011-03-27 NOTE — Assessment & Plan Note (Signed)
Parking sticker done.

## 2011-03-27 NOTE — Assessment & Plan Note (Signed)
Refer for mammogram.  

## 2011-03-27 NOTE — Assessment & Plan Note (Signed)
Continue meds.  She appears stable.  She has had sig changes at home, but appears to be coping well. She'll call back with update.  >25 min spent with face to face with patient, >50% counseling and/or coordinating care

## 2011-04-13 ENCOUNTER — Ambulatory Visit
Admission: RE | Admit: 2011-04-13 | Discharge: 2011-04-13 | Disposition: A | Discharge status: Home / Self Care | Payer: Medicare Other | Source: Ambulatory Visit | Attending: Family Medicine | Admitting: Family Medicine

## 2011-04-13 DIAGNOSIS — Z1231 Encounter for screening mammogram for malignant neoplasm of breast: Secondary | ICD-10-CM

## 2011-05-02 ENCOUNTER — Inpatient Hospital Stay: Admission: RE | Admit: 2011-05-02 | Payer: Medicare Other | Source: Ambulatory Visit

## 2011-06-23 ENCOUNTER — Other Ambulatory Visit: Payer: Self-pay | Admitting: *Deleted

## 2011-06-23 ENCOUNTER — Ambulatory Visit (INDEPENDENT_AMBULATORY_CARE_PROVIDER_SITE_OTHER): Payer: Medicare Other | Admitting: Family Medicine

## 2011-06-23 ENCOUNTER — Encounter: Payer: Self-pay | Admitting: Family Medicine

## 2011-06-23 DIAGNOSIS — M810 Age-related osteoporosis without current pathological fracture: Secondary | ICD-10-CM

## 2011-06-23 DIAGNOSIS — F329 Major depressive disorder, single episode, unspecified: Secondary | ICD-10-CM

## 2011-06-23 MED ORDER — ALPRAZOLAM 0.25 MG PO TABS: 0.2500 mg | ORAL_TABLET | Freq: Four times a day (QID) | ORAL | Status: DC

## 2011-06-23 NOTE — Progress Notes (Signed)
Still caring for niece's child (61 months old now, sleeping through the night), this is a temporary foster care situation.   Her niece is doing better, but pt may be looking after the child for up to another year. SW/CM is coming out to the house.  She likes caring for child.  Mood had been okay except when her back flares up.  "I'm nothing like last year.  I don't want to go back there."  Christmas was hard, because her son [name] had died, but she was able to get through.  No SI/HI.  Meds have helped.  No ADE.  She brought pictures of the infant.   Meds, vitals, and allergies reviewed.   ROS: See HPI.  Otherwise, noncontributory.  nad ncat Mmm rrr ctab Affect wnl, speech wnl, no tremor

## 2011-06-23 NOTE — Patient Instructions (Addendum)
Call about the done density and see about getting is scheduling.   Good luck with Nevaeh, she's a cute baby.   Don't change your meds.  Come back and see me in 3 months.  30 min visit.

## 2011-06-24 ENCOUNTER — Ambulatory Visit: Payer: Medicare Other | Admitting: Family Medicine

## 2011-06-26 ENCOUNTER — Encounter: Payer: Self-pay | Admitting: Family Medicine

## 2011-06-26 NOTE — Assessment & Plan Note (Addendum)
Overall improved, she brightens up talking about the child. No ADE on meds.  Will continue as is. She agrees for f/u.  No change in meds. >25 min spent with face to face with patient, >50% counseling and/or coordinating care.

## 2011-06-26 NOTE — Assessment & Plan Note (Signed)
dxa pending 

## 2011-07-06 ENCOUNTER — Other Ambulatory Visit: Payer: Self-pay | Admitting: *Deleted

## 2011-07-06 ENCOUNTER — Ambulatory Visit (INDEPENDENT_AMBULATORY_CARE_PROVIDER_SITE_OTHER)
Admission: RE | Admit: 2011-07-06 | Discharge: 2011-07-06 | Disposition: A | Discharge status: Home / Self Care | Payer: Medicare Other | Source: Ambulatory Visit

## 2011-07-06 DIAGNOSIS — M810 Age-related osteoporosis without current pathological fracture: Secondary | ICD-10-CM

## 2011-07-06 MED ORDER — PAROXETINE HCL 40 MG PO TABS: 20.0000 mg | ORAL_TABLET | Freq: Every day | ORAL | Status: DC

## 2011-07-06 NOTE — Telephone Encounter (Signed)
OK to refill

## 2011-07-14 ENCOUNTER — Encounter: Payer: Self-pay | Admitting: Family Medicine

## 2011-08-03 ENCOUNTER — Ambulatory Visit (INDEPENDENT_AMBULATORY_CARE_PROVIDER_SITE_OTHER): Payer: Medicare Other | Admitting: Family Medicine

## 2011-08-03 ENCOUNTER — Encounter: Payer: Self-pay | Admitting: Family Medicine

## 2011-08-03 DIAGNOSIS — M810 Age-related osteoporosis without current pathological fracture: Secondary | ICD-10-CM

## 2011-08-03 NOTE — Assessment & Plan Note (Signed)
Tolerating meds. Score hasn't worsened.  Would continue as is, plan for 5 years of fosamax.  D/w pt about possible ADEs.  Will continue as is for now.  Check vit D today.  She agrees. >15 min spent with face to face with patient, >50% counseling and/or coordinating care

## 2011-08-03 NOTE — Patient Instructions (Signed)
You can get your results through our phone system.  Follow the instructions on the blue card. Keep taking the fosamax.  Take care.

## 2011-08-03 NOTE — Progress Notes (Signed)
Osteoporosis.  F/u.  DXA with change, -3.6 to -3.  On fosamax for 2 years, tolerated well.  Plan discussed. No ADE.  D/w pt about possible atypical long bone fx.  She already has dentures and no plan for dental work. Last 2 dexa's d/w pt.   Meds, vitals, and allergies reviewed.   ROS: See HPI.  Otherwise, noncontributory.  nad Affect wnl

## 2011-09-12 ENCOUNTER — Other Ambulatory Visit: Payer: Self-pay | Admitting: *Deleted

## 2011-09-12 MED ORDER — ALPRAZOLAM 0.25 MG PO TABS: 0.2500 mg | ORAL_TABLET | Freq: Four times a day (QID) | ORAL | Status: DC

## 2011-09-12 NOTE — Telephone Encounter (Signed)
Faxed refill request   

## 2011-09-13 NOTE — Telephone Encounter (Signed)
rx called to [facility]

## 2011-09-22 ENCOUNTER — Ambulatory Visit: Payer: Medicare Other | Admitting: Family Medicine

## 2011-10-13 ENCOUNTER — Other Ambulatory Visit: Payer: Self-pay | Admitting: *Deleted

## 2011-10-13 MED ORDER — ALPRAZOLAM 0.25 MG PO TABS: 0.2500 mg | ORAL_TABLET | Freq: Four times a day (QID) | ORAL | Status: DC

## 2011-10-13 NOTE — Telephone Encounter (Signed)
Last refill 09/13/2011. 

## 2011-10-14 NOTE — Telephone Encounter (Signed)
rx called to pharmacy 

## 2011-11-10 ENCOUNTER — Other Ambulatory Visit: Payer: Self-pay | Admitting: *Deleted

## 2011-11-10 NOTE — Telephone Encounter (Signed)
Faxed refill request from [facility] pharmacy, last filled 120 on 10/14/11.

## 2011-11-11 MED ORDER — ALPRAZOLAM 0.25 MG PO TABS: 0.2500 mg | ORAL_TABLET | Freq: Four times a day (QID) | ORAL | Status: DC

## 2011-11-11 NOTE — Telephone Encounter (Signed)
Medicine called to [facility].

## 2011-12-13 ENCOUNTER — Other Ambulatory Visit: Payer: Self-pay | Admitting: *Deleted

## 2011-12-13 MED ORDER — ALPRAZOLAM 0.25 MG PO TABS: 0.2500 mg | ORAL_TABLET | Freq: Four times a day (QID) | ORAL | Status: DC

## 2011-12-13 NOTE — Telephone Encounter (Signed)
OK to refill

## 2011-12-14 NOTE — Telephone Encounter (Signed)
rx called to pharmacy 

## 2011-12-28 ENCOUNTER — Other Ambulatory Visit: Payer: Self-pay | Admitting: *Deleted

## 2011-12-28 MED ORDER — ALENDRONATE SODIUM 70 MG PO TABS: 70.0000 mg | ORAL_TABLET | ORAL | Status: DC

## 2012-01-09 ENCOUNTER — Other Ambulatory Visit: Payer: Self-pay | Admitting: *Deleted

## 2012-01-10 NOTE — Telephone Encounter (Signed)
Okay to refill but due for yearly medicare wellnes.. Have her make appt to be seen with fasting labs prior.

## 2012-01-11 MED ORDER — ALPRAZOLAM 0.25 MG PO TABS: 0.2500 mg | ORAL_TABLET | Freq: Four times a day (QID) | ORAL | Status: DC

## 2012-01-11 NOTE — Telephone Encounter (Signed)
rx called to pharmacy and patient transferred up front to schedule appt

## 2012-01-12 ENCOUNTER — Ambulatory Visit (INDEPENDENT_AMBULATORY_CARE_PROVIDER_SITE_OTHER): Payer: Medicare Other | Admitting: Family Medicine

## 2012-01-12 ENCOUNTER — Encounter: Payer: Self-pay | Admitting: Family Medicine

## 2012-01-12 ENCOUNTER — Telehealth: Payer: Self-pay

## 2012-01-12 VITALS — BP 100/64 | HR 64 | Temp 98.8°F | Ht 63.0 in | Wt 128.2 lb

## 2012-01-12 DIAGNOSIS — Z1212 Encounter for screening for malignant neoplasm of rectum: Secondary | ICD-10-CM

## 2012-01-12 DIAGNOSIS — F329 Major depressive disorder, single episode, unspecified: Secondary | ICD-10-CM

## 2012-01-12 DIAGNOSIS — Z0000 Encounter for general adult medical examination without abnormal findings: Secondary | ICD-10-CM

## 2012-01-12 DIAGNOSIS — F4322 Adjustment disorder with anxiety: Secondary | ICD-10-CM

## 2012-01-12 DIAGNOSIS — E785 Hyperlipidemia, unspecified: Secondary | ICD-10-CM

## 2012-01-12 DIAGNOSIS — G8929 Other chronic pain: Secondary | ICD-10-CM

## 2012-01-12 DIAGNOSIS — I10 Essential (primary) hypertension: Secondary | ICD-10-CM

## 2012-01-12 LAB — COMPREHENSIVE METABOLIC PANEL
Albumin: 4.4 g/dL (ref 3.5–5.2)
BUN: 6 mg/dL (ref 6–23)
Calcium: 9.6 mg/dL (ref 8.4–10.5)
Chloride: 101 mEq/L (ref 96–112)
Creatinine, Ser: 0.9 mg/dL (ref 0.4–1.2)
GFR: 69.79 mL/min (ref >60.00)
Glucose, Bld: 95 mg/dL (ref 70–99)
Potassium: 4.7 mEq/L (ref 3.5–5.1)

## 2012-01-12 LAB — LIPID PANEL
Cholesterol: 236 mg/dL — ABNORMAL HIGH (ref 0–200)
Triglycerides: 112 mg/dL (ref 0.0–149.0)

## 2012-01-12 MED ORDER — VENLAFAXINE HCL ER 37.5 MG PO CP24: ORAL_CAPSULE | ORAL | Status: DC

## 2012-01-12 MED ORDER — DULOXETINE HCL 30 MG PO CPEP: 30.0000 mg | ORAL_CAPSULE | Freq: Every day | ORAL | Status: DC

## 2012-01-12 MED ORDER — VENLAFAXINE HCL ER 75 MG PO CP24: 75.0000 mg | ORAL_CAPSULE | Freq: Every day | ORAL | Status: DC

## 2012-01-12 NOTE — Telephone Encounter (Signed)
Pt said Cymbalta copay is $45.00' pt cannot afford; request less expensive med sent to [facility] pharmacy. Pt said she was not taking Paroxetine regularly; do you think if she took med as prescribed it would help because it is less expensive?

## 2012-01-12 NOTE — Telephone Encounter (Signed)
Pt cannot take paroxetine regularly given SE. Will hold off on other SSRIs given this iisue  Cymblata too expensive.. Will try venlafaxine 37.5 mg daily, increase to 75mg  after 1 week. If this is too expensive we can try amitryptiline at bedtime.

## 2012-01-12 NOTE — Patient Instructions (Addendum)
Stop paxil. Start cymbalta 30 mg daily. If no Side effects but no benefit yet.. Can increase to 60 mg daily after one week. Stop clonazepam. Change alprazolam once out to 0.5 mg three times daily.  Follow up in 1 month mood. Stop at lab on way out. Call insurance to see what osteoporosis treatments ie. IV boniva, forteo etc that they cover

## 2012-01-12 NOTE — Telephone Encounter (Signed)
Patient notified. She will price venlafaxine and if too expensive, she will call back for amitriptyline.
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
## 2012-01-16 ENCOUNTER — Encounter: Payer: Self-pay | Admitting: *Deleted

## 2012-01-22 DIAGNOSIS — M549 Dorsalgia, unspecified: Secondary | ICD-10-CM | POA: Insufficient documentation

## 2012-01-22 NOTE — Assessment & Plan Note (Signed)
See note on depression. Using a lot of pain meds with only moderate to poor control of pain... Will try to get better control of pain with cymbalta to decrease use of pain meds

## 2012-01-22 NOTE — Assessment & Plan Note (Signed)
Due for re-eval. 

## 2012-01-22 NOTE — Assessment & Plan Note (Signed)
Well controlled. Continue current medication.  

## 2012-01-22 NOTE — Assessment & Plan Note (Signed)
Poor control on multiple similar agents to control leg pain symptoms as well. Using too much anxiolytic and pain medication. Stop paxil. Start cymbalta 30 mg daily. If no Side effects but no benefit yet.. Can increase to 60 mg daily after one week. Stop clonazepam. Change alprazolam once out to 0.5 mg three times daily.  Follow up in 1 month mood.

## 2012-02-01 ENCOUNTER — Telehealth: Payer: Self-pay | Admitting: Family Medicine

## 2012-02-01 NOTE — Telephone Encounter (Signed)
Caller: Zeinab/Patient; Patient Name: Tiffany [name]; PCP: [name].; Best Callback Phone Number: [phone].  Call regarding Xanax refill.  Patient was advised to increase to 0.5mg  three times a day on 8-1 at office visit, noted in MD's chart.  Per Patient Pharmacy has sent several request since 8-19. Advised Patient to follow up with office when reopens on 8-22 for Xanax refill.

## 2012-02-02 ENCOUNTER — Other Ambulatory Visit: Payer: Self-pay

## 2012-02-02 NOTE — Telephone Encounter (Signed)
Pt request new prescription for Alprazolam 0.5 mg taking one tab three times a day. Pt has finished alprazolam 0.25 mg; pt out of med and request new rx sent [place] [facility] pharmacy.

## 2012-02-03 MED ORDER — ALPRAZOLAM 0.5 MG PO TABS: 0.5000 mg | ORAL_TABLET | Freq: Three times a day (TID) | ORAL | Status: DC

## 2012-02-03 NOTE — Telephone Encounter (Signed)
Pt left v/m request call back [phone].

## 2012-02-03 NOTE — Telephone Encounter (Signed)
  LAst OV :DEPRESSION - Kerby Nora, MD 01/22/2012 11:19 PM Signed  Poor control on multiple similar agents to control leg pain symptoms as well. Using too much anxiolytic and pain medication.  Stop paxil. Start cymbalta 30 mg daily. If no Side effects but no benefit yet.. Can increase to 60 mg daily after one week.  Stop clonazepam.  Change alprazolam once out to 0.5 mg three times daily.  Follow up in 1 month mood.   Make sure pt has 1 month follow up mood in early to mid September (1 month after starting venlafaxine) Could not afford cymbalta. Should have started venlafaxine. MAke sure she has. Will refill alprazolam as discussed at higher dose.

## 2012-02-03 NOTE — Telephone Encounter (Signed)
Alprazolam called to pharmacy, left message asking pt to call back.

## 2012-02-03 NOTE — Telephone Encounter (Signed)
Pt left v/m requesting status of alprazolam rx. Call [phone].

## 2012-02-06 NOTE — Telephone Encounter (Signed)
Spoke with patient and advised results   

## 2012-02-14 ENCOUNTER — Encounter: Payer: Self-pay | Admitting: Family Medicine

## 2012-02-14 ENCOUNTER — Ambulatory Visit (INDEPENDENT_AMBULATORY_CARE_PROVIDER_SITE_OTHER): Payer: Medicare Other | Admitting: Family Medicine

## 2012-02-14 VITALS — BP 128/72 | HR 84 | Temp 98.7°F | Wt 132.0 lb

## 2012-02-14 DIAGNOSIS — R5383 Other fatigue: Secondary | ICD-10-CM

## 2012-02-14 DIAGNOSIS — Z79899 Other long term (current) drug therapy: Secondary | ICD-10-CM

## 2012-02-14 DIAGNOSIS — F341 Dysthymic disorder: Secondary | ICD-10-CM

## 2012-02-14 DIAGNOSIS — F418 Other specified anxiety disorders: Secondary | ICD-10-CM

## 2012-02-14 DIAGNOSIS — G8929 Other chronic pain: Secondary | ICD-10-CM

## 2012-02-14 MED ORDER — CLONAZEPAM 0.5 MG PO TABS: ORAL_TABLET | ORAL | Status: DC

## 2012-02-14 NOTE — Assessment & Plan Note (Signed)
Per Dr. Vear Clock. She cannot cahnge to alprazolam alone as clonazepam controlling leg symtpoms.  Spoke in detail with Dr. Vear Clock about the redundency of her medication and he agrees that she needs to chose either clonazepam or alprazolam.  In discusion with pt and Dr. Vear Clock.Marland Kitchengiven her primary concern is to not sleep at night... We will have her wean of alprazolam and instead increase clonazepam to 1 mg at bedtime.

## 2012-02-14 NOTE — Patient Instructions (Addendum)
Go to lab on your way out for b12. Clonazepam 0.05 mg during the day and 1 mg (2 x 0.5 mg tablets) at bedtime. Use alprazolam 0.25 mg every as needed over next few week, with the goal of tapering off completely over 2-3 weeks. Try taking paxil every day for the next month. Keep appt with Dr. Vear Clock in 3 months. Follow up appt in 1 month mood.

## 2012-02-14 NOTE — Assessment & Plan Note (Signed)
Inadequately control as evidences by her use of alprazolam 4 times daily. Only taking paxil every other day . Cannot afford cymbalta and venlafaxine caused SE. She will try to given paxil a full change for SE to resolve with taking 1 tab po daily x 1 month.  She states that one of her goals is to simplify the medications and come off some of her meds. We will try again by instead weaning alprazolam instead of clonazepam.  Total visit time 30 minutes, > 50% spent counseling and cordinating patients care.
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
## 2012-03-06 ENCOUNTER — Ambulatory Visit (INDEPENDENT_AMBULATORY_CARE_PROVIDER_SITE_OTHER): Payer: Medicare Other | Admitting: Family Medicine

## 2012-03-06 ENCOUNTER — Encounter: Payer: Self-pay | Admitting: Family Medicine

## 2012-03-06 VITALS — BP 130/84 | HR 70 | Temp 98.7°F | Ht 64.0 in | Wt 134.8 lb

## 2012-03-06 DIAGNOSIS — R079 Chest pain, unspecified: Secondary | ICD-10-CM

## 2012-03-06 DIAGNOSIS — G8929 Other chronic pain: Secondary | ICD-10-CM

## 2012-03-06 DIAGNOSIS — F341 Dysthymic disorder: Secondary | ICD-10-CM

## 2012-03-06 DIAGNOSIS — F418 Other specified anxiety disorders: Secondary | ICD-10-CM

## 2012-03-06 MED ORDER — ALPRAZOLAM 0.25 MG PO TABS: 0.2500 mg | ORAL_TABLET | Freq: Four times a day (QID) | ORAL | Status: DC

## 2012-03-06 NOTE — Progress Notes (Signed)
Subjective:    Patient ID: Tiffany [name], female    DOB: 02/04/1946, 66 y.o.   MRN: [id number]  HPI 66 year old female presents for 1 month follow up mood. She was on multiple sedating and addictive meds at last OV which spurred a discussion on medication regimen and overall safety.   In effort to get better leg pain control and anxiety control and to simplify regimen. I prescribed cymbalta and changed alprazolam to 0.5 mg three times daily.  Stop clonazepam and paxil,   She was unable to fill cymbalta given price.  She filled venlafaxine 37.5mg  then increased to 75 mg.. She noted swelling in face, feet, hands, mouth.  She could not tell difference on higher dose alprazolam for her mood... Not taking the clonazepam made her leg hurt more.   On 8/24: We spoke with Dr. Vear Clock pain MD.. Changed her to clonazepam higher dose, paxil higher dose . She reports that she was unable totolerate paxil and has stopped it.    She followed up with Dr. Alanda Amass Cardiologist for recent chest tightness, and peripheral swelling that she associates with the cahnging of her neds that we have done it last months. Has been off paxil in several weeks.  She has a scheduled stress test to look at her heart. Saw Dr, Alanda Amass last week.  TODAY she delivers the ultimatum... "put me back on my alprazolam or I will find another doctor" She does state that she can do without the clonazepam for her legs.She states she cannot survive without the alprazolam.  She states she had not filled the clonazepam higher dose pills, but then says alter she is not sure if she did.  She denies SI.      Review of Systems  Constitutional: Positive for fatigue. Negative for fever.  HENT: Negative for ear pain.   Respiratory: Positive for chest tightness. Negative for shortness of breath.   Cardiovascular: Positive for leg swelling. Negative for chest pain and palpitations.       Hand swelling  Gastrointestinal: Negative  for abdominal pain.  Musculoskeletal: Positive for joint swelling and arthralgias.       Objective:   Physical Exam  Constitutional: Vital signs are normal. She appears well-developed and well-nourished. She is cooperative.  Non-toxic appearance. She does not appear ill. No distress.  HENT:  Head: Normocephalic.  Right Ear: Hearing, tympanic membrane, external ear and ear canal normal. Tympanic membrane is not erythematous, not retracted and not bulging.  Left Ear: Hearing, tympanic membrane, external ear and ear canal normal. Tympanic membrane is not erythematous, not retracted and not bulging.  Nose: No mucosal edema or rhinorrhea. Right sinus exhibits no maxillary sinus tenderness and no frontal sinus tenderness. Left sinus exhibits no maxillary sinus tenderness and no frontal sinus tenderness.  Mouth/Throat: Uvula is midline, oropharynx is clear and moist and mucous membranes are normal.  Eyes: Conjunctivae normal, EOM and lids are normal. Pupils are equal, round, and reactive to light. No foreign bodies found.  Neck: Trachea normal and normal range of motion. Neck supple. Carotid bruit is not present. No mass and no thyromegaly present.  Cardiovascular: Normal rate, regular rhythm, S1 normal, S2 normal, normal heart sounds, intact distal pulses and normal pulses.  Exam reveals no gallop and no friction rub.   No murmur heard. Pulmonary/Chest: Effort normal and breath sounds normal. Not tachypneic. No respiratory distress. She has no decreased breath sounds. She has no wheezes. She has no rhonchi. She has no rales.  Abdominal: Soft. Normal appearance and bowel sounds are normal. There is no tenderness.  Musculoskeletal:       OA deformity in B hands DIP joints, mild swelling in B hands  Neurological: She is alert.  Skin: Skin is warm, dry and intact. No rash noted.  Psychiatric: Her speech is normal and behavior is normal. Judgment and thought content normal. Her mood appears not anxious.  Cognition and memory are normal. She does not exhibit a depressed mood.          Assessment & Plan:

## 2012-03-06 NOTE — Patient Instructions (Addendum)
Can try lexapro for mood  Restart alprazolam.  Stay off klonopin. Take leftover klonopin to pharmacy to destroy before they will given alprazolam prescription.  We will discuss with Dr. Vear Clock... That we will try alprazolam instead of klonopin.

## 2012-03-12 DIAGNOSIS — R079 Chest pain, unspecified: Secondary | ICD-10-CM | POA: Insufficient documentation

## 2012-03-12 NOTE — Assessment & Plan Note (Addendum)
Again discussed issue in detail with pt: she has been on clonazpam and alprazolam BOTH for a while without [place] noting it. (Clonazepam prescribed by Dr. Vear Clock) She did not realize this was an issue and I am having difficulty explaining to  her  that they are the similar meds and a higher dose of one should be like using the other.  Both meds should not be used to avoid confusion and overdose. She states today that she will stop the clonazepam in favor of the alprazolam. I have agreed to this if she will turn in the clonazepam prescription or med itself to the pharmacist. She seems to beunable to stop the alprazolam because of a fear that she cannot do without it. She refuses to continue any SSRI or other antidepressant due to associated SE. Dr. Alanda Amass did given her lexapro which she may consider when feeling better. I have repeatedly offered a referral to a psychiatrist but she continues to refuse.

## 2012-03-12 NOTE — Assessment & Plan Note (Addendum)
Per Dr. Vear Clock. I will forward this note to Dr. Vear Clock as the pt has asked to change our plan of using clonazepam instead of alprazolam. She states she has been off of both in past few weeks.  Per pt request, we will have her use alprazolam ONLY for anxiety. If Dr. Vear Clock and pt decide to reinstate the clonazepam as well.[name] She will need to go to a psychiatrist to manage her anxiety. The patient has been notified of this.

## 2012-03-12 NOTE — Assessment & Plan Note (Signed)
Eval pending with stress test by Dr. Alanda Amass.

## 2012-03-20 ENCOUNTER — Ambulatory Visit: Payer: Medicare Other | Admitting: Family Medicine

## 2012-03-26 HISTORY — PX: OTHER SURGICAL HISTORY: SHX169

## 2012-03-29 ENCOUNTER — Encounter: Payer: Self-pay | Admitting: Family Medicine

## 2012-04-10 ENCOUNTER — Telehealth: Payer: Self-pay | Admitting: Family Medicine

## 2012-04-10 ENCOUNTER — Ambulatory Visit (INDEPENDENT_AMBULATORY_CARE_PROVIDER_SITE_OTHER): Payer: Medicare Other | Admitting: Family Medicine

## 2012-04-10 ENCOUNTER — Encounter: Payer: Self-pay | Admitting: Family Medicine

## 2012-04-10 VITALS — BP 118/78 | HR 88 | Temp 97.8°F | Wt 132.2 lb

## 2012-04-10 DIAGNOSIS — R300 Dysuria: Secondary | ICD-10-CM

## 2012-04-10 DIAGNOSIS — N390 Urinary tract infection, site not specified: Secondary | ICD-10-CM

## 2012-04-10 LAB — POCT URINALYSIS DIPSTICK
Bilirubin, UA: NEGATIVE
Glucose, UA: NEGATIVE
Ketones, UA: NEGATIVE
Spec Grav, UA: 1.02

## 2012-04-10 MED ORDER — PROMETHAZINE HCL 25 MG PO TABS: 25.0000 mg | ORAL_TABLET | Freq: Three times a day (TID) | ORAL | Status: DC | PRN

## 2012-04-10 MED ORDER — CIPROFLOXACIN HCL 500 MG PO TABS: 500.0000 mg | ORAL_TABLET | Freq: Two times a day (BID) | ORAL | Status: DC

## 2012-04-10 NOTE — Telephone Encounter (Signed)
[facility] Triage Call Report Triage Record Num: [id number] Operator: Bennie Hind Patient Name: Tiffany [name] Call Date & Time: 04/09/2012 6:21:42PM Patient Phone: [phone] PCP: Kerby Nora Patient Gender: Female PCP Fax : [phone] Patient DOB: July 31, 1945 [facility] Name: [facility] Reason for Call: Caller: [name]/Patient; PCP: Kerby Nora (Family Practice); CB#: [phone]; Call regarding Urinary Pain/Bleeding; 04-08-12 onset of urinary pain, and low back pain. 04-09-12 she states temp is 100, she said her low back pain goes around to front and feels like everything is going to fall out so much pressure. All emergent sxs per Urinary Symptoms ruled out except for urinary tract symptoms and any flank or back pain. Advised her to go to [facility] to be see tonight. She said it hurts to sit. I advised her whereever she goes ED, UCC or office she would have to sit to wait I stressed the seriousness of what could happen if she doesn't get seen that this could progress to point she could be in the hospital getting IV meds. Protocol(s) Used: Urinary Symptoms - Female Recommended Outcome per Protocol: See Provider within 4 hours Reason for Outcome: Urinary tract symptoms AND any flank or low back pain Care Advice: ~ 04/09/2012 6:32:23PM Page 1 of 1 [email or web address]

## 2012-04-10 NOTE — Patient Instructions (Signed)
Start antibiotics. Push fluids. Phenergan for nausea.  Call if not keeping down liquids and antibiotics.  Call if not improving some in next 48 hours.

## 2012-04-10 NOTE — Addendum Note (Signed)
Addended by: Alvina Chou on: 04/10/2012 11:46 AM   Modules accepted: Orders

## 2012-04-10 NOTE — Assessment & Plan Note (Signed)
Given low grade fever and back pain (although low down) will treat as pyelonephritis. Send urine for culture.  Phenergan for nausea. 7 days of cipro. She si to call if unable to keep down antibiotics for hospital admission.

## 2012-04-10 NOTE — Progress Notes (Signed)
  Subjective:    Patient ID: Tiffany [name], female    DOB: 1946-01-06, 66 y.o.   MRN: [id number]  Urinary Tract Infection  This is a new problem. The current episode started in the past 7 days. The problem occurs every urination. The problem has been gradually worsening. The quality of the pain is described as burning and aching. The pain is moderate. The maximum temperature recorded prior to her arrival was 100 - 100.9 F. The fever has been present for less than 1 day. Associated symptoms include flank pain, frequency, nausea, urgency and vomiting. Pertinent negatives include no hematuria. Associated symptoms comments: Low back pain. More on left than right. Low abdominal pressure. No fluids today.. She has tried nothing (Home kit.) for the symptoms. The treatment provided no relief. There is no history of catheterization, kidney stones, recurrent UTIs, urinary stasis or a urological procedure.      Review of Systems  Gastrointestinal: Positive for nausea and vomiting.  Genitourinary: Positive for urgency, frequency and flank pain. Negative for hematuria.       Objective:   Physical Exam  Constitutional: Vital signs are normal. She appears well-developed and well-nourished. She is cooperative.  Non-toxic appearance. She does not appear ill. No distress.  HENT:  Head: Normocephalic.  Right Ear: Hearing, tympanic membrane, external ear and ear canal normal. Tympanic membrane is not erythematous, not retracted and not bulging.  Left Ear: Hearing, tympanic membrane, external ear and ear canal normal. Tympanic membrane is not erythematous, not retracted and not bulging.  Nose: No mucosal edema or rhinorrhea. Right sinus exhibits no maxillary sinus tenderness and no frontal sinus tenderness. Left sinus exhibits no maxillary sinus tenderness and no frontal sinus tenderness.  Mouth/Throat: Uvula is midline, oropharynx is clear and moist and mucous membranes are normal.  Eyes: Conjunctivae  normal, EOM and lids are normal. Pupils are equal, round, and reactive to light. No foreign bodies found.  Neck: Trachea normal and normal range of motion. Neck supple. Carotid bruit is not present. No mass and no thyromegaly present.  Cardiovascular: Normal rate, regular rhythm, S1 normal, S2 normal, normal heart sounds, intact distal pulses and normal pulses.  Exam reveals no gallop and no friction rub.   No murmur heard. Pulmonary/Chest: Effort normal and breath sounds normal. Not tachypneic. No respiratory distress. She has no decreased breath sounds. She has no wheezes. She has no rhonchi. She has no rales.  Abdominal: Soft. Normal appearance and bowel sounds are normal. There is tenderness in the suprapubic area. There is no CVA tenderness.       Left lower back pain  Neurological: She is alert.  Skin: Skin is warm, dry and intact. No rash noted.  Psychiatric: Her speech is normal and behavior is normal. Judgment and thought content normal. Her mood appears not anxious. Cognition and memory are normal. She does not exhibit a depressed mood.          Assessment & Plan:

## 2012-04-12 LAB — URINE CULTURE: Colony Count: 1000

## 2012-04-13 ENCOUNTER — Ambulatory Visit (INDEPENDENT_AMBULATORY_CARE_PROVIDER_SITE_OTHER)
Admission: RE | Admit: 2012-04-13 | Discharge: 2012-04-13 | Disposition: A | Discharge status: Home / Self Care | Payer: Medicare Other | Source: Ambulatory Visit | Attending: Internal Medicine | Admitting: Internal Medicine

## 2012-04-13 ENCOUNTER — Telehealth: Payer: Self-pay | Admitting: Family Medicine

## 2012-04-13 DIAGNOSIS — R319 Hematuria, unspecified: Secondary | ICD-10-CM

## 2012-04-13 DIAGNOSIS — R109 Unspecified abdominal pain: Secondary | ICD-10-CM

## 2012-04-13 DIAGNOSIS — M549 Dorsalgia, unspecified: Secondary | ICD-10-CM

## 2012-04-13 NOTE — Telephone Encounter (Signed)
Per Shirlee Limerick pt on way to [employer]. Call report requested after 5 to Dr. Daphine Deutscher cell.

## 2012-04-13 NOTE — Telephone Encounter (Signed)
She did have blood in urine even though no infeciton.. ? Kidney stones causing pain. i would recommend CT of ab and pelvis non contrast. iw ill send in a referral for her to have this ASAP.

## 2012-04-13 NOTE — Telephone Encounter (Signed)
Message copied by [name] on Fri Apr 13, 2012  3:08 PM ------      Message from: Frazer, Joyce Copa      Created: Fri Apr 13, 2012  2:12 PM       Pt still having symptoms, states nausea, pain across pubic area, hurts to urinate about 10 on pain scale. Pt states that urine is darker and cloudy.

## 2012-04-16 ENCOUNTER — Telehealth: Payer: Self-pay

## 2012-04-16 NOTE — Telephone Encounter (Signed)
Pt left v/m calling to give update then requested call back. Left v/m pt to call back.

## 2012-04-18 NOTE — Telephone Encounter (Signed)
Pt saw urologist on 04/16/12 and is presently taking antibiotic and advised cystis, pyloria and gross hematuria. Pt has f/u appt scheduled with urologist.

## 2012-04-19 NOTE — Telephone Encounter (Signed)
Noted. Reviewed urologist note that was faxed over.

## 2012-05-24 NOTE — Telephone Encounter (Signed)
Patient seen in office

## 2012-06-19 ENCOUNTER — Telehealth: Payer: Self-pay | Admitting: Family Medicine

## 2012-06-19 ENCOUNTER — Ambulatory Visit (INDEPENDENT_AMBULATORY_CARE_PROVIDER_SITE_OTHER): Payer: Medicare Other | Admitting: Family Medicine

## 2012-06-19 ENCOUNTER — Encounter: Payer: Self-pay | Admitting: Family Medicine

## 2012-06-19 VITALS — BP 160/92 | HR 85 | Temp 100.6°F | Ht 64.0 in | Wt 138.0 lb

## 2012-06-19 DIAGNOSIS — J111 Influenza due to unidentified influenza virus with other respiratory manifestations: Secondary | ICD-10-CM

## 2012-06-19 MED ORDER — GUAIFENESIN-CODEINE 100-10 MG/5ML PO SYRP: 5.0000 mL | ORAL_SOLUTION | Freq: Three times a day (TID) | ORAL | Status: DC | PRN

## 2012-06-19 MED ORDER — OSELTAMIVIR PHOSPHATE 75 MG PO CAPS: 75.0000 mg | ORAL_CAPSULE | Freq: Two times a day (BID) | ORAL | Status: DC

## 2012-06-19 MED ORDER — OSELTAMIVIR PHOSPHATE 75 MG PO CAPS: 75.0000 mg | ORAL_CAPSULE | Freq: Every day | ORAL | Status: DC

## 2012-06-19 NOTE — Telephone Encounter (Signed)
Patient has appt with u today

## 2012-06-19 NOTE — Telephone Encounter (Signed)
[facility] Triage Call Report Triage Record Num: [id number] Operator: [employer] Patient Name: Tiffany [name] Call Date & Time: 06/18/2012 8:00:59PM Patient Phone: [phone] PCP: Kerby Nora Patient Gender: Female PCP Fax : [phone] Patient DOB: [birth year] [facility] Reason for Call: Caller: Adilenne/Patient; PCP: Kerby Nora (Family Practice); CB#: [phone]; Call regarding hoarseness, productive cough with onset 06/18/12; taking OTC mucous relief, Chloroseptic, Robitussin; Afebrile; headache and nasal congestion; decreased appetite, vomiting; Pt states that she has some blood streaked sputum; Disposition of See Provider within 4 hours due to "Blood streaked sputum" per Cough Protocol; Pt advised to go to ED and she says she doesn't know what she will do. Protocol(s) Used: Cough - Adult Recommended Outcome per Protocol: See Provider within 4 hours Reason for Outcome: Blood streaked sputum Care Advice: ~ Another adult should drive. ~ Use a cool mist humidifier to moisten air. Be sure to clean according to manufacturer's instructions. Call EMS 911 if sudden worsening of breathing problem, dusky or blue/gray color of lips, fingernail beds or skin; chest pain, weakness, or confusion occurs. ~ ~ IMMEDIATE ACTION ~ List, or take, all current prescription(s), nonprescription or alternative medication(s) to provider for evaluation. ~ Go to the ED immediately if notice new swelling, tender cord-like vein in lower leg(s). Most adults need to drink 6-10 eight-ounce glasses (1.2-2.0 liters) of fluids per day unless previously told to limit fluid intake for other medical reasons. Limit fluids that contain caffeine, sugar or alcohol. Urine will be a very light yellow color when you drink enough fluids. ~ 06/18/2012 8:25:06PM Page 1 of 1 [email or web address]_[email or web address]_[email or web address]

## 2012-06-19 NOTE — Patient Instructions (Addendum)
Push fluids, Rest. Tylenol for fever. Robitussin or Mucinex for cough during the day. Cough suppressant at night. Start tamiflu. Advised to not return to work until 24 hour after fever resolved on no antipyretics.

## 2012-06-19 NOTE — Telephone Encounter (Signed)
Call pt to see how she is and if she needs to set up an appointment.

## 2012-06-19 NOTE — Assessment & Plan Note (Signed)
Discussed symptomatic care.  Hydration, rest. Call if SOB, cough worsening or prolongued fever. Reviewed flu timeline. She has no health problems that put her at risk for complications but is greater than 69 years old. Tamiflu sent in. Pt agreed. Discussed family prophylaxis, sent in tamiflu prophylaxis for husband. She was advised to not return to work until 24 hour after fever resolved on no antipyretics.

## 2012-06-19 NOTE — Progress Notes (Signed)
  Subjective:    Patient ID: Tiffany [name], female    DOB: 07/18/1945, 67 y.o.   MRN: [id number]  Cough This is a new problem. The current episode started in the past 7 days (started sudden onset 2 days ago). The cough is productive of sputum and productive of blood-tinged sputum. Associated symptoms include chills, ear congestion, ear pain, a fever, headaches, myalgias, nasal congestion, a sore throat and sweats. Pertinent negatives include no rash, shortness of breath or wheezing. Associated symptoms comments: hoarseness. Exacerbated by: no exposure known to flu. She has tried OTC cough suppressant (mucinex, robitussin, tylenol) for the symptoms. The treatment provided mild relief. Her past medical history is significant for environmental allergies. did not get flu shot  Fever  Associated symptoms include coughing, ear pain, headaches and a sore throat. Pertinent negatives include no rash or wheezing.  Headache  Associated symptoms include coughing, ear pain, a fever and a sore throat. (Did not get flu shot)      Review of Systems  Constitutional: Positive for fever and chills.  HENT: Positive for ear pain and sore throat.   Respiratory: Positive for cough. Negative for shortness of breath and wheezing.   Musculoskeletal: Positive for myalgias.  Skin: Negative for rash.  Neurological: Positive for headaches.  Hematological: Positive for environmental allergies.       Objective:   Physical Exam  Constitutional: Vital signs are normal. She appears well-developed and well-nourished. She is cooperative.  Non-toxic appearance. She appears ill. No distress.  HENT:  Head: Normocephalic.  Right Ear: Hearing, tympanic membrane, external ear and ear canal normal. Tympanic membrane is not erythematous, not retracted and not bulging.  Left Ear: Hearing, tympanic membrane, external ear and ear canal normal. Tympanic membrane is not erythematous, not retracted and not bulging.  Nose: Mucosal  edema and rhinorrhea present. Right sinus exhibits no maxillary sinus tenderness and no frontal sinus tenderness. Left sinus exhibits no maxillary sinus tenderness and no frontal sinus tenderness.  Mouth/Throat: Uvula is midline and mucous membranes are normal. Posterior oropharyngeal erythema present. No oropharyngeal exudate, posterior oropharyngeal edema or tonsillar abscesses.  Eyes: Conjunctivae normal, EOM and lids are normal. Pupils are equal, round, and reactive to light. No foreign bodies found.  Neck: Trachea normal and normal range of motion. Neck supple. Carotid bruit is not present. No mass and no thyromegaly present.  Cardiovascular: Normal rate, regular rhythm, S1 normal, S2 normal, normal heart sounds, intact distal pulses and normal pulses.  Exam reveals no gallop and no friction rub.   No murmur heard. Pulmonary/Chest: Effort normal and breath sounds normal. Not tachypneic. No respiratory distress. She has no decreased breath sounds. She has no wheezes. She has no rhonchi. She has no rales.       Constant hacking cough  Neurological: She is alert.  Skin: Skin is warm, dry and intact. No rash noted.  Psychiatric: Her speech is normal and behavior is normal. Judgment normal. Her mood appears not anxious. Cognition and memory are normal. She does not exhibit a depressed mood.          Assessment & Plan:

## 2012-06-22 ENCOUNTER — Other Ambulatory Visit: Payer: Self-pay

## 2012-06-22 MED ORDER — PAROXETINE HCL 40 MG PO TABS: 20.0000 mg | ORAL_TABLET | Freq: Every day | ORAL | Status: DC

## 2012-06-22 MED ORDER — CETIRIZINE HCL 10 MG PO TABS: 10.0000 mg | ORAL_TABLET | Freq: Every day | ORAL | Status: DC

## 2012-06-22 NOTE — Telephone Encounter (Signed)
Pharmacy also requesting paroxetine hcl 40 mg take 1/2 tab po qd. Not on medication list.

## 2012-06-22 NOTE — Telephone Encounter (Signed)
[facility] faxed refill cetirizine. Last fill date not given. Pt seen 06/19/12 with influenza.Please advise.

## 2012-06-25 ENCOUNTER — Encounter: Payer: Self-pay | Admitting: Family Medicine

## 2012-06-25 ENCOUNTER — Ambulatory Visit (INDEPENDENT_AMBULATORY_CARE_PROVIDER_SITE_OTHER): Payer: Medicare Other | Admitting: Family Medicine

## 2012-06-25 VITALS — BP 110/60 | HR 96 | Temp 98.3°F | Ht 64.0 in | Wt 136.8 lb

## 2012-06-25 DIAGNOSIS — R059 Cough, unspecified: Secondary | ICD-10-CM

## 2012-06-25 DIAGNOSIS — J111 Influenza due to unidentified influenza virus with other respiratory manifestations: Secondary | ICD-10-CM

## 2012-06-25 DIAGNOSIS — R05 Cough: Secondary | ICD-10-CM

## 2012-06-25 MED ORDER — HYDROCOD POLST-CHLORPHEN POLST 10-8 MG/5ML PO LQCR: ORAL | Status: DC

## 2012-06-25 MED ORDER — BENZONATATE 200 MG PO CAPS: 200.0000 mg | ORAL_CAPSULE | Freq: Three times a day (TID) | ORAL | Status: DC | PRN

## 2012-06-25 NOTE — Progress Notes (Signed)
Nature conservation officer at [facility] [address] [place] [address] Phone: [phone] Fax: [phone]  Date:  06/25/2012   Name:  Tiffany [name]   DOB:  Mar 24, 1946   MRN:  [id number] Gender: female Age: 67 y.o.  PCP:  Kerby Nora, MD  Evaluating MD: Hannah Beat, MD   Chief Complaint: Cough   History of Present Illness:  Tiffany [name] is a 67 y.o. pleasant patient who presents with the following:  Pt. Dx with Influenza 06/19/2012 by Dr. Leonard Schwartz, and now she is still running intermittent high fever, feels terrible, and has a horrible cough that is keeping her awake and bothering her all day.  Mucinex Dm and robitussin AC have not helped at all. Still running some fever, was up to 102 Started sweating some about six oclock Got up and took a bath.   Feels horrible. Drinking, but not eating.  Coughing bad.   Patient Active Problem List  Diagnosis  . HYPERLIPIDEMIA  . ESSENTIAL HYPERTENSION, BENIGN  . Mitral Valve Disorders  . ALLERGIC RHINITIS  . GERD  . DERMATITIS, CONTACT, NOS  . BAKER'S CYST, LEFT KNEE  . OSTEOPOROSIS  . ANGINA, HX OF  . Depression with anxiety  . Suicide threat or attempt  . Hiatal hernia  . Breast cancer screening  . OA (osteoarthritis)  . Chronic pain  . Influenza    Past Medical History  Diagnosis Date  . GERD (gastroesophageal reflux disease)   . Hyperlipemia   . Osteoporosis 07/15/1999  . Anxiety and depression   . History of MRI of cervical spine 09/98    Dr. Elesa Hacker  . History of MRI of lumbar spine 10/1998    Dr. Elesa Hacker  . Arthritis     knee  . Dupuytren's contracture of hand     Past Surgical History  Procedure Date  . Lumbar spine surgery 1981  . Benign tumor 1975    fallopian tubr removed  . Tonsillectomy and adenoidectomy as a child  . Abd ultrasound 03/02/1999    NML, no gallstones  . 2 d echo 08/1999~04/13/2006    Mild MVP, MILD MR, Mild aortic sclerosis  . Spirometry 06/2004    NML  . Cardiac  catheterization 01/2001  . Esophagogastroduodenoscopy     Sliding H. H. ~ 07/1995-11/2003 Neg  . Dipyridamole stress test 08/08/07    nl EF 62%  . Knee surgery     History  Substance Use Topics  . Smoking status: Never Smoker   . Smokeless tobacco: Never Used  . Alcohol Use: No    Family History  Problem Relation Age of Onset  . Cancer Mother     breast  . Cancer Father     throat    Allergies  Allergen Reactions  . Aspirin     REACTION: SWELLING  . Codeine     REACTION: SWELLING  . Fexofenadine     REACTION: VOMITING  . Latex     REACTION: HIVES  . Nsaids     REACTION: SWELLING  . Tramadol     REACTION: ITCH  . Zolpidem Tartrate     REACTION: unknown    Medication list has been reviewed and updated.  Outpatient Prescriptions Prior to Visit  Medication Sig Dispense Refill  . alendronate (FOSAMAX) 70 MG tablet Take 1 tablet (70 mg total) by mouth every 7 (seven) days. Take with a full glass of water on an empty stomach.  4 tablet  11  . cetirizine (  ZYRTEC ALLERGY) 10 MG tablet Take 1 tablet (10 mg total) by mouth daily.  90 tablet  3  . escitalopram (LEXAPRO) 10 MG tablet Take 10 mg by mouth daily.      [name] guaiFENesin-codeine (ROBITUSSIN AC) 100-10 MG/5ML syrup Take 5 mLs by mouth 3 (three) times daily as needed for cough.  120 mL  0  . methocarbamol (ROBAXIN-750) 750 MG tablet Take 750 mg by mouth 3 (three) times daily.        . metoprolol (LOPRESSOR) 50 MG tablet Take 50 mg by mouth 2 (two) times daily.      . pantoprazole (PROTONIX) 40 MG tablet Take 40 mg by mouth daily.      [name] PARoxetine (PAXIL) 40 MG tablet Take 0.5 tablets (20 mg total) by mouth daily.  30 tablet  5  . oseltamivir (TAMIFLU) 75 MG capsule Take 1 capsule (75 mg total) by mouth 2 (two) times daily.  10 capsule  0  . pantoprazole (PROTONIX) 40 MG tablet Take 40 mg by mouth 2 (two) times daily.       Last reviewed on 06/25/2012 11:33 AM by Consuello Masse, CMA  Review of  Systems:  ROS: GEN: Acute illness details above GI: Tolerating PO intake GU: maintaining adequate hydration and urination Pulm: No SOB Interactive and getting along well at home.  Otherwise, ROS is as per the HPI.   Physical Examination: BP 110/60  Pulse 96  Temp 98.3 F (36.8 C) (Oral)  Ht 5\' 4"  (1.626 m)  Wt 136 lb 12 oz (62.029 kg)  BMI 23.47 kg/m2  Ideal Body Weight: Weight in (lb) to have BMI = 25: 145.3    Gen: WDWN, NAD; A & O x3, cooperative. Pleasant.Globally Non-toxic HEENT: Normocephalic and atraumatic. Throat clear, w/o exudate, R TM clear, L TM - good landmarks, No fluid present. rhinnorhea. No frontal or maxillary sinus T. MMM NECK: Anterior cervical  LAD is present CV: RRR, No M/G/R, cap refill <2 sec PULM: Breathing comfortably in no respiratory distress. no wheezing, crackles, but rare rhonchi ABD: S,NT,ND,+BS. No HSM. No rebound. EXT: No c/c/e PSYCH: Friendly, good eye contact MSK: Nml gait   Assessment and Plan:  1. Influenza   2. Cough    Completed tamiflu - stable. Terrible cough and will try to help with symptoms.  Orders Today:  No orders of the defined types were placed in this encounter.    Updated Medication List: (Includes new medications, updates to list, dose adjustments) Meds ordered this encounter  Medications  . chlorpheniramine-HYDROcodone (TUSSIONEX) 10-8 MG/5ML LQCR    Sig: 1 tsp po qhs prn cough    Dispense:  140 mL    Refill:  0  . benzonatate (TESSALON) 200 MG capsule    Sig: Take 1 capsule (200 mg total) by mouth 3 (three) times daily as needed for cough.    Dispense:  50 capsule    Refill:  0    Medications Discontinued: There are no discontinued medications.   Hannah Beat, MD

## 2012-08-10 ENCOUNTER — Encounter (HOSPITAL_COMMUNITY)
Admission: AD | Disposition: A | Discharge status: Home / Self Care | Payer: Self-pay | Source: Ambulatory Visit | Attending: Internal Medicine

## 2012-08-10 ENCOUNTER — Encounter (HOSPITAL_COMMUNITY): Payer: Self-pay | Admitting: *Deleted

## 2012-08-10 ENCOUNTER — Telehealth: Payer: Self-pay

## 2012-08-10 ENCOUNTER — Telehealth: Payer: Self-pay | Admitting: Gastroenterology

## 2012-08-10 ENCOUNTER — Ambulatory Visit (HOSPITAL_COMMUNITY)
Admission: AD | Admit: 2012-08-10 | Discharge: 2012-08-10 | Disposition: A | Discharge status: Home / Self Care | Payer: Medicare Other | Source: Ambulatory Visit | Attending: Internal Medicine | Admitting: Internal Medicine

## 2012-08-10 DIAGNOSIS — R1319 Other dysphagia: Secondary | ICD-10-CM

## 2012-08-10 DIAGNOSIS — K209 Esophagitis, unspecified without bleeding: Secondary | ICD-10-CM | POA: Insufficient documentation

## 2012-08-10 DIAGNOSIS — I252 Old myocardial infarction: Secondary | ICD-10-CM | POA: Insufficient documentation

## 2012-08-10 DIAGNOSIS — M810 Age-related osteoporosis without current pathological fracture: Secondary | ICD-10-CM | POA: Insufficient documentation

## 2012-08-10 DIAGNOSIS — K224 Dyskinesia of esophagus: Secondary | ICD-10-CM | POA: Insufficient documentation

## 2012-08-10 DIAGNOSIS — K219 Gastro-esophageal reflux disease without esophagitis: Secondary | ICD-10-CM | POA: Insufficient documentation

## 2012-08-10 DIAGNOSIS — K2940 Chronic atrophic gastritis without bleeding: Secondary | ICD-10-CM | POA: Insufficient documentation

## 2012-08-10 DIAGNOSIS — R1310 Dysphagia, unspecified: Secondary | ICD-10-CM | POA: Insufficient documentation

## 2012-08-10 DIAGNOSIS — E785 Hyperlipidemia, unspecified: Secondary | ICD-10-CM | POA: Insufficient documentation

## 2012-08-10 DIAGNOSIS — K259 Gastric ulcer, unspecified as acute or chronic, without hemorrhage or perforation: Secondary | ICD-10-CM | POA: Insufficient documentation

## 2012-08-10 HISTORY — DX: Cardiac murmur, unspecified: R01.1

## 2012-08-10 HISTORY — PX: ESOPHAGOGASTRODUODENOSCOPY: SHX5428

## 2012-08-10 HISTORY — DX: Essential (primary) hypertension: I10

## 2012-08-10 HISTORY — PX: SAVORY DILATION: SHX5439

## 2012-08-10 SURGERY — EGD (ESOPHAGOGASTRODUODENOSCOPY)
Anesthesia: Moderate Sedation

## 2012-08-10 MED ORDER — BUTAMBEN-TETRACAINE-BENZOCAINE 2-2-14 % EX AERO
INHALATION_SPRAY | CUTANEOUS | Status: DC | PRN
  Administered 2012-08-10: 2 via TOPICAL

## 2012-08-10 MED ORDER — FENTANYL CITRATE 0.05 MG/ML IJ SOLN
INTRAMUSCULAR | Status: DC | PRN
  Administered 2012-08-10 (×3): 25 ug via INTRAVENOUS

## 2012-08-10 MED ORDER — MIDAZOLAM HCL 10 MG/2ML IJ SOLN
INTRAMUSCULAR | Status: DC | PRN
  Administered 2012-08-10: 1 mg via INTRAVENOUS
  Administered 2012-08-10 (×2): 2 mg via INTRAVENOUS

## 2012-08-10 MED ORDER — DIPHENHYDRAMINE HCL 50 MG/ML IJ SOLN
INTRAMUSCULAR | Status: AC
  Filled 2012-08-10: qty 1

## 2012-08-10 MED ORDER — FENTANYL CITRATE 0.05 MG/ML IJ SOLN
INTRAMUSCULAR | Status: AC
  Filled 2012-08-10: qty 2

## 2012-08-10 MED ORDER — MIDAZOLAM HCL 10 MG/2ML IJ SOLN
INTRAMUSCULAR | Status: AC
  Filled 2012-08-10: qty 2

## 2012-08-10 MED ORDER — SODIUM CHLORIDE 0.9 % IV SOLN: INTRAVENOUS | Status: DC

## 2012-08-10 NOTE — H&P (Signed)
  Tiffany [name] 1945-07-26 MRN [id number]        History of Present Illness:  This is a 67 yo WF with acute onset of dysphagia to solids and liquids after eating a pork chop, occurred  48 hours ago. She gives hx of dysphagia which was evaluated with EGD and Barium esophagram in 2005 but no abnormality found. She had a small hiatal hernia. She is on Fosamax. She has been on antibiotics for URI, completed them in last 3 weeks. She has been on Protonix for GERD.   Past Medical History  Diagnosis Date  . GERD (gastroesophageal reflux disease)   . Hyperlipemia   . Osteoporosis 07/15/1999  . Anxiety and depression   . History of MRI of cervical spine 09/98    Dr. Elesa Hacker  . History of MRI of lumbar spine 10/1998    Dr. Elesa Hacker  . Arthritis     knee  . Dupuytren's contracture of hand   . Hypertension   . Heart murmur   . Myocardial infarction    Past Surgical History  Procedure Laterality Date  . Lumbar spine surgery  1981  . Benign tumor  1975    fallopian tubr removed  . Tonsillectomy and adenoidectomy  as a child  . Abd ultrasound  03/02/1999    NML, no gallstones  . 2 d echo  08/1999~04/13/2006    Mild MVP, MILD MR, Mild aortic sclerosis  . Spirometry  06/2004    NML  . Cardiac catheterization  01/2001  . Esophagogastroduodenoscopy      Sliding H. H. ~ 07/1995-11/2003 Neg  . Dipyridamole stress test  08/08/07    nl EF 62%  . Knee surgery      reports that she has never smoked. She has never used smokeless tobacco. She reports that she does not drink alcohol or use illicit drugs. family history includes Cancer in her father and mother. Allergies  Allergen Reactions  . Aspirin     REACTION: SWELLING  . Codeine     REACTION: SWELLING  . Fexofenadine     REACTION: VOMITING  . Latex     REACTION: HIVES  . Nsaids     REACTION: SWELLING  . Tramadol     REACTION: ITCH  . Zolpidem Tartrate     REACTION: unknown        Review of Systems:  The remainder  of the 10 point ROS is negative except as outlined in H&P   Physical Exam: General appearance  Well developed, in no distress. Eyes- non icteric. HEENT nontraumatic, normocephalic., no thrush, normal voice Mouth no lesions, tongue papillated, no cheilosis. Neck supple without adenopathy, thyroid not enlarged, no carotid bruits, no JVD. Lungs Clear to auscultation bilaterally. Cor normal S1, normal S2, regular rhythm, no murmur,  quiet precordium. Abdomen: soft, non tender, active bowl sounds Rectal:not done Extremities no pedal edema. Skin no lesions. Neurological alert and oriented x 3. Psychological normal mood and affect.  Assessment and Plan:  Acute dysphagia 48 hours ago, pt claims not being able to swallow anything but she has no problem swallowing her saliva, r/o Candida esophagitis ( recent antibiotics), T/O FOOD IMPACTIOM, R/O SPASM, plan upper endoscopy and possible dilation. Hold Fosamax which may be contributing to esophageal disorder   08/10/2012 [name]

## 2012-08-10 NOTE — Telephone Encounter (Signed)
Pt left v/m having trouble swallowing; has severe reflux and request referral to GI to open esophagus.(pt used to see Dr Victorino Dike for stomach problems)

## 2012-08-10 NOTE — Interval H&P Note (Signed)
History and Physical Interval Note:  08/10/2012 5:13 PM  Tiffany [name]  has presented today for surgery, with the diagnosis of food impaction  The various methods of treatment have been discussed with the patient and family. After consideration of risks, benefits and other options for treatment, the patient has consented to  Procedure(s): ESOPHAGOGASTRODUODENOSCOPY (EGD) (N/A) as a surgical intervention .  The patient's history has been reviewed, patient examined, no change in status, stable for surgery.  I have reviewed the patient's chart and labs.  Questions were answered to the patient's satisfaction.     [name]

## 2012-08-10 NOTE — Telephone Encounter (Signed)
Sounds like she has food impaction in esophagus.  Please page Dr. Juanda Chance about it, will likely need EGD today.  Not sure if Dr. Juanda Chance prefers ER evaluation or straight to endo.

## 2012-08-10 NOTE — Telephone Encounter (Signed)
Informed pt I have paged Dr Juanda Chance, but she is doing a procedure. She can wait until I talk with her or go to the ER; pt will wait to hear from Dr Juanda Chance.

## 2012-08-10 NOTE — Telephone Encounter (Signed)
Dr Christella Hartigan [facility]. Old pt of Dr Victorino Dike. Pt reports she hasn't been able to swallow anything for 2 days, she just throws it back up, even water. She feels as though something is in her throat and reports the last thing she ate was pork chops. I asked if Dr Doreatha Martin ever had to stretch her throat she she stated many times. Please advise; send to Dr Juanda Chance? Thanks.

## 2012-08-10 NOTE — Telephone Encounter (Signed)
Informed pt that Dr Juanda Chance will see her at [facility]; please go to the Admitting ofc and she will be taken directly to the Endo lab. Pt is upset and doesn't want to be admitted. Explained to pt usually food impaction pts are sent back home unless the throat/esophagus is severely swollen; pt states she just got over the flu.

## 2012-08-10 NOTE — Op Note (Addendum)
[facility] [address] [place], [address]   ENDOSCOPY PROCEDURE REPORT  PATIENT: Tiffany [name], Tiffany [name]  MR#: [id number] BIRTHDATE: [birth year] , 66  yrs. old GENDER: Female ENDOSCOPIST: Hart Carwin, MD REFERRED BY:  [name], M.D. PROCEDURE DATE:  08/10/2012 PROCEDURE:  EGD w/ biopsy and Savary dilation of esophagus ASA CLASS:     Class II INDICATIONS:  Dysphagia, acute episode following meat  ingestion, 48 hours ago, now unable to swallow liquids and solids but no problem handeling saliva, last EGD 2005- not available,  Ba esophagram 2005 was normal. MEDICATIONS: These medications were titrated to patient response per physician's verbal order, Fentanyl 75 mcg IV, and Versed 5 mg IV TOPICAL ANESTHETIC: Cetacaine Spray  DESCRIPTION OF PROCEDURE: After the risks benefits and alternatives of the procedure were thoroughly explained, informed consent was obtained.  The EG-[id number] ([id number]) endoscope was introduced through the mouth and advanced to the second portion of the duodenum. Without limitations.  The instrument was slowly withdrawn as the mucosa was fully examined.      ESOPHGUS: esophageal mucosa was normal in proximal  and mid esophagus, there was no evidence of Candida esophagitis, ,at the g-e junction mucosa was  hyperemic and acutely inflamed circumferentially, possibly from food impaction which passed,biopsies were taken, there was  mild spasm at LES level, Savary dilators 16 and 17 mm passed over a guide wire without difficulty, there was small amount of blood on the dilator. STOMACH: gastri folds were notmal, retroflexion showed normal cardia and fundus, gastri antrum had mild erythema ans thickened folds- biopsies were taken[         The scope was then withdrawn from the patient and the procedure completed.  COMPLICATIONS: There were no complications. ENDOSCOPIC IMPRESSION:  Grade 1 esophagitis, possibly due to a recent food impaction  which passed, spasm,  distal esophagus  BUT NO STRICTURE S/P DILATION WITH sAVARY DILATORS 16 AND 17 MM MILD GASTRITIS,S/P BIOPSIES   RECOMMENDATIONS:  1.  Await biopsy results 2.  Anti-reflux regimen to be follow 3.  Continue PPI 4. chew carefully, avoid foods difficult to chew 5. call Dr Juanda Chance for a follow up appointment in4-6 weeks, [phone]  no recall             REPEAT EXAM:  eSigned:  Hart Carwin, MD 08/10/2012 6:08 PM   CC:  PATIENT NAME:  [name], [name] MR#: [id number]

## 2012-08-13 ENCOUNTER — Encounter (HOSPITAL_COMMUNITY): Payer: Self-pay | Admitting: Internal Medicine

## 2012-08-13 ENCOUNTER — Encounter: Payer: Self-pay | Admitting: *Deleted

## 2012-08-14 ENCOUNTER — Encounter: Payer: Self-pay | Admitting: Internal Medicine

## 2012-09-14 ENCOUNTER — Ambulatory Visit (INDEPENDENT_AMBULATORY_CARE_PROVIDER_SITE_OTHER): Payer: Medicare Other | Admitting: Internal Medicine

## 2012-09-14 ENCOUNTER — Encounter: Payer: Self-pay | Admitting: Internal Medicine

## 2012-09-14 VITALS — BP 120/72 | HR 84 | Ht 64.0 in | Wt 131.6 lb

## 2012-09-14 DIAGNOSIS — K219 Gastro-esophageal reflux disease without esophagitis: Secondary | ICD-10-CM

## 2012-09-14 DIAGNOSIS — R1319 Other dysphagia: Secondary | ICD-10-CM

## 2012-09-14 NOTE — Patient Instructions (Addendum)
Dr Ermalene Searing

## 2012-09-14 NOTE — Progress Notes (Signed)
Tiffany [name] 1946-02-05 MRN [id number]  History of Present Illness:  This is a 67 year old white female who underwent an urgent upper endoscopy for food impaction 2 months ago. The incident occurred after she ate a pork chop. She has a history of gastroesophageal reflux on an upper endoscopy in 2005. During her recent upper endoscopy, she was found to have a spasm at the lower esophageal sphincter level and was dilated with 16 and 17 mm Savary dilators. She has been on Protonix 40 mg twice a day with complete resolution of all her symptoms. She denies odynophagia, dysphagia or chest pain. She has been eating a regular diet.   Past Medical History  Diagnosis Date  . GERD (gastroesophageal reflux disease)   . Hyperlipemia   . Osteoporosis 07/15/1999  . Anxiety and depression   . History of MRI of cervical spine 09/98    Dr. Elesa [name]  . History of MRI of lumbar spine 10/1998    Dr. Elesa [name]  . Arthritis     knee  . Dupuytren's contracture of hand   . Hypertension   . Heart murmur   . Myocardial infarction   . Esophageal spasm   . Plantar fasciitis, bilateral    Past Surgical History  Procedure Laterality Date  . Lumbar spine surgery  1981  . Tubal ligation  1975    benign tumor  . Tonsillectomy and adenoidectomy  as a child  . Abd ultrasound  03/02/1999    NML, no gallstones  . 2 d echo  08/1999~04/13/2006    Mild MVP, MILD MR, Mild aortic sclerosis  . Spirometry  06/2004    NML  . Cardiac catheterization  01/2001  . Esophagogastroduodenoscopy      Sliding [facility]. [facility]. ~ 07/1995-11/2003 Neg  . Dipyridamole stress test  08/08/07    nl EF 62%  . Knee surgery    . Esophagogastroduodenoscopy N/A 08/10/2012    Procedure: ESOPHAGOGASTRODUODENOSCOPY (EGD);  Surgeon: Tiffany Carwin, MD;  Location: [facility];  Service: Endoscopy;  Laterality: N/A;  . Savory dilation N/A 08/10/2012    Procedure: SAVORY DILATION;  Surgeon: Tiffany Carwin, MD;  Location: [facility];  Service: Endoscopy;   Laterality: N/A;    reports that she has never smoked. She has never used smokeless tobacco. She reports that she does not drink alcohol or use illicit drugs. family history includes Breast cancer in her mother and Throat cancer in her father. Allergies  Allergen Reactions  . Aspirin     REACTION: SWELLING  . Codeine     REACTION: SWELLING  . Fexofenadine     REACTION: VOMITING  . Latex     REACTION: HIVES  . Nsaids     REACTION: SWELLING  . Tramadol     REACTION: ITCH  . Zolpidem Tartrate     REACTION: unknown        Review of Systems:  The remainder of the 10 point ROS is negative except as outlined in H&P    Assessment and Plan:  Problem #73 67 year old white female who is status post recent food impaction and esophageal dilation. She is currently asymptomatic. She will remain on Protonix 40 mg daily and follow anti-reflex measures. We will see her on an as necessary basis.  Problem #2 Colorectal screening. She is up-to-date on her colonoscopy.   09/14/2012 Tiffany [name]

## 2012-12-10 ENCOUNTER — Telehealth: Payer: Self-pay | Admitting: Family Medicine

## 2012-12-10 ENCOUNTER — Encounter (HOSPITAL_COMMUNITY): Payer: Self-pay | Admitting: Emergency Medicine

## 2012-12-10 ENCOUNTER — Emergency Department (HOSPITAL_COMMUNITY)
Admission: EM | Admit: 2012-12-10 | Discharge: 2012-12-10 | Discharge status: Against Medical Advice | Payer: Medicare Other | Attending: Emergency Medicine | Admitting: Emergency Medicine

## 2012-12-10 ENCOUNTER — Ambulatory Visit: Payer: Medicare Other | Admitting: Family Medicine

## 2012-12-10 DIAGNOSIS — I10 Essential (primary) hypertension: Secondary | ICD-10-CM | POA: Insufficient documentation

## 2012-12-10 DIAGNOSIS — R109 Unspecified abdominal pain: Secondary | ICD-10-CM | POA: Insufficient documentation

## 2012-12-10 LAB — URINALYSIS, ROUTINE W REFLEX MICROSCOPIC
Nitrite: NEGATIVE
Specific Gravity, Urine: 1.01 (ref 1.005–1.030)
pH: 5.5 (ref 5.0–8.0)

## 2012-12-10 LAB — CBC WITH DIFFERENTIAL/PLATELET
Basophils Relative: 1 % (ref 0–1)
Hemoglobin: 14.3 g/dL (ref 12.0–15.0)
Lymphs Abs: 2.4 10*3/uL (ref 0.7–4.0)
MCHC: 35 g/dL (ref 30.0–36.0)
Monocytes Relative: 9 % (ref 3–12)
Neutro Abs: 5.1 10*3/uL (ref 1.7–7.7)
Neutrophils Relative %: 61 % (ref 43–77)
RBC: 4.47 MIL/uL (ref 3.87–5.11)
WBC: 8.4 10*3/uL (ref 4.0–10.5)

## 2012-12-10 LAB — URINE MICROSCOPIC-ADD ON

## 2012-12-10 LAB — POCT I-STAT, CHEM 8
Chloride: 91 mEq/L — ABNORMAL LOW (ref 96–112)
Glucose, Bld: 109 mg/dL — ABNORMAL HIGH (ref 70–99)
HCT: 46 % (ref 36.0–46.0)
Potassium: 3.2 mEq/L — ABNORMAL LOW (ref 3.5–5.1)

## 2012-12-10 NOTE — Telephone Encounter (Signed)
Pt decided to go to ER, appt with Dr. Milinda Antis canceled

## 2012-12-10 NOTE — Telephone Encounter (Signed)
Patient Information:  Caller Name: Tiffany [name]  Phone: [phone]  Patient: Tiffany [name]  Gender: Female  DOB: 09-15-1945  Age: 67 Years  PCP: Kerby Nora (Family Practice)  Office Follow Up:  Does the office need to follow up with this patient?: No  Instructions For The Office: N/A  RN Note:  PT was seen by Urologist on 6-30, Foley cath preformed 50cc removed, Keflex started.  Advised Pt to f/u w/ PCP. Abdominal pain is generalized lower, Urologist felt pain may be related to appendix. Pt has appt at 1630 on 6-30.  Discussed ED due to scans need to r/o appendice.  Pt would rather come to office appt.  Symptoms  Reason For Call & Symptoms: ER CALL. Abdominal Pain, Urinary retention, seen by Urologist at 11:30 on 6-20  Reviewed Health History In EMR: N/A  Reviewed Medications In EMR: N/A  Reviewed Allergies In EMR: N/A  Reviewed Surgeries / Procedures: N/A  Date of Onset of Symptoms: 12/10/2012  Treatments Tried: Foley cath performed at urologist appt, 50cc removed  Treatments Tried Worked: No  Guideline(s) Used:  Abdominal Pain - Female  Disposition Per Guideline:   Go to ED Now  Reason For Disposition Reached:   Severe abdominal pain (e.g., excruciating)  Advice Given:  N/A  Patient Will Follow Care Advice:  YES

## 2012-12-10 NOTE — Telephone Encounter (Signed)
I will see her then but if pain becomes severe in the meantime please instruct her to go to ER where they can do scans if needed , thanks

## 2012-12-10 NOTE — ED Notes (Addendum)
Pain since Thurs thought she was constipated- took Miralax and has diarr; currently having pressure on bladde; saw urologist today; gave her antibiotic. PCP told her to come here. Urinating every hour without much urine. Urologist said she does not think she has an infection; was not retaining urine. Reports she is nauseated but not uncommon because she has reflux.

## 2012-12-12 ENCOUNTER — Telehealth: Payer: Self-pay

## 2012-12-12 NOTE — Telephone Encounter (Signed)
Notify pt that potassiuma nd sodium were low.. Other then abnormal urine labs were normal.  She needs to have repeat BMET to assure correction of these. Dx hypokalemia and hyponatremia... Please order. If pt well does not need to be seen.

## 2012-12-12 NOTE — Telephone Encounter (Signed)
Pt went to [facility] ED 12/10/12 stayed for 5 hours , pt was told would be 5 more hours until pt was seen;had blood test drawn and vitals taken. Pt left Ed before saw the ED physician due to pts abd pain being so severe. Pt saw Dr Erling Conte on 12/10/12; urine culture was done, antibiotic started and advised to call PCP. Pt said the pain is better since taking Keflex; no fever,urinary symptoms are better; pt wants to know if needs to see Dr Ermalene Searing since doing better. Pt wants to know if Dr Ermalene Searing can get lab results done at [facility] ER (pt was told from [facility] ER would have to get lab results from PCP).

## 2012-12-13 NOTE — Telephone Encounter (Signed)
Patient advised is and is coming for labs on monday

## 2012-12-13 NOTE — Telephone Encounter (Signed)
Left message for patient to return my call.

## 2012-12-17 ENCOUNTER — Other Ambulatory Visit (INDEPENDENT_AMBULATORY_CARE_PROVIDER_SITE_OTHER): Payer: Medicare Other

## 2012-12-17 ENCOUNTER — Telehealth: Payer: Self-pay

## 2012-12-17 DIAGNOSIS — E876 Hypokalemia: Secondary | ICD-10-CM

## 2012-12-17 LAB — BASIC METABOLIC PANEL
Calcium: 8.7 mg/dL (ref 8.4–10.5)
Chloride: 96 mEq/L (ref 96–112)
Creatinine, Ser: 0.9 mg/dL (ref 0.4–1.2)
GFR: 68.67 mL/min (ref >60.00)

## 2012-12-17 NOTE — Telephone Encounter (Signed)
Patient advised I was unsure of the call and sorry for any inconvience

## 2012-12-17 NOTE — Telephone Encounter (Signed)
Pt left v/m returning call from this AM; pt request cb [phone].

## 2012-12-19 ENCOUNTER — Encounter: Payer: Self-pay | Admitting: Family Medicine

## 2012-12-19 ENCOUNTER — Ambulatory Visit (INDEPENDENT_AMBULATORY_CARE_PROVIDER_SITE_OTHER): Payer: Medicare Other | Admitting: Family Medicine

## 2012-12-19 DIAGNOSIS — N390 Urinary tract infection, site not specified: Secondary | ICD-10-CM

## 2012-12-19 DIAGNOSIS — R109 Unspecified abdominal pain: Secondary | ICD-10-CM

## 2012-12-19 DIAGNOSIS — R1030 Lower abdominal pain, unspecified: Secondary | ICD-10-CM | POA: Insufficient documentation

## 2012-12-19 LAB — POCT URINALYSIS DIPSTICK
Ketones, UA: NEGATIVE
Nitrite, UA: NEGATIVE
Protein, UA: NEGATIVE
Urobilinogen, UA: 0.2

## 2012-12-19 NOTE — Patient Instructions (Addendum)
I want to get a CT scan of your abdomen/ pelvis  Stop up front on the way out - likely we will need to call you back to schedule that appt  Eat bland diet  Drink lots of water  Will make a plan when results return If symptoms become severe in the meantime go to the ER We will culture your urine and call you back with a result

## 2012-12-19 NOTE — Progress Notes (Signed)
Subjective:    Patient ID: Tiffany [name], female    DOB: 02-27-1946, 67 y.o.   MRN: [id number]  HPI Here for f/u from ER visit and urology visit on 6/30  Pt was tx by urologist Dr Donney Rankins for uti (cath specimen) with keflex  Had bad enough abd pain to go [facility] without being seen due to length of wait for that  Her labs showed slt low na and K Repeat was better   Chemistry      Component Value Date/Time   NA 135 12/17/2012 0937   K 3.4* 12/17/2012 0937   CL 96 12/17/2012 0937   CO2 28 12/17/2012 0937   BUN 5* 12/17/2012 0937   CREATININE 0.9 12/17/2012 0937      Component Value Date/Time   CALCIUM 8.7 12/17/2012 0937   ALKPHOS 86 01/12/2012 1138   AST 21 01/12/2012 1138   ALT 12 01/12/2012 1138   BILITOT 0.4 01/12/2012 1138      Now still having pain over her lower abdomen - both sides and tender to the touch That has improved but has not gone away Hx of gerd -that is not bothering her  No hx of colon problems   Urine- still burns to urinate (but improved)  No vaginal symptoms  Has not had a hyst Last pelvic exam was about 5 years ago   No fever or other symptoms   Sees urol for bladder infections No other urolgic problems  She does take uribrel for urinary pain -not really helping No blood in urine  occ flank pain -but that is chronic from back pain   Lab Results  Component Value Date   WBC 8.4 12/10/2012   HGB 15.6* 12/10/2012   HCT 46.0 12/10/2012   MCV 91.3 12/10/2012   PLT 270 12/10/2012     Review of Systems Review of Systems  Constitutional: Negative for fever, appetite change, fatigue and unexpected weight change.  Eyes: Negative for pain and visual disturbance.  Respiratory: Negative for cough and shortness of breath.   Cardiovascular: Negative for cp or palpitations    Gastrointestinal: Negative for nausea, diarrhea and constipation. pos for lower abd pain  Genitourinary: Negative for urgency and frequency. pos for dysuria/ neg for gyn symptoms or vag  d/c Skin: Negative for pallor or rash  or itch Neurological: Negative for weakness, light-headedness, numbness and headaches.  Hematological: Negative for adenopathy. Does not bruise/bleed easily.  Psychiatric/Behavioral: Negative for dysphoric mood. The patient is not nervous/anxious.         Objective:   Physical Exam  Constitutional: She appears well-developed and well-nourished. No distress.  HENT:  Head: Normocephalic and atraumatic.  Mouth/Throat: Oropharynx is clear and moist.  Eyes: Conjunctivae and EOM are normal. Pupils are equal, round, and reactive to light. No scleral icterus.  Neck: Normal range of motion. Neck supple.  Cardiovascular: Normal rate and regular rhythm.   Pulmonary/Chest: Effort normal and breath sounds normal. No respiratory distress. She has no wheezes. She has no rales.  Abdominal: Soft. Bowel sounds are normal. She exhibits no distension, no abdominal bruit, no ascites, no pulsatile midline mass and no mass. There is no hepatosplenomegaly. There is tenderness in the suprapubic area, left upper quadrant and left lower quadrant. There is no rigidity, no rebound, no guarding and no CVA tenderness.  Musculoskeletal: She exhibits no edema.  Lymphadenopathy:    She has no cervical adenopathy.  Neurological: She is alert. She has normal reflexes. No  cranial nerve deficit. She exhibits normal muscle tone. Coordination normal.  Skin: Skin is warm and dry. No rash noted. No erythema. No pallor.  Psychiatric: She has a normal mood and affect.          Assessment & Plan:

## 2012-12-20 ENCOUNTER — Ambulatory Visit (INDEPENDENT_AMBULATORY_CARE_PROVIDER_SITE_OTHER)
Admission: RE | Admit: 2012-12-20 | Discharge: 2012-12-20 | Disposition: A | Discharge status: Home / Self Care | Payer: Medicare Other | Source: Ambulatory Visit | Attending: Family Medicine | Admitting: Family Medicine

## 2012-12-20 DIAGNOSIS — R109 Unspecified abdominal pain: Secondary | ICD-10-CM

## 2012-12-20 LAB — POCT UA - MICROSCOPIC ONLY: Yeast, UA: 0

## 2012-12-20 MED ORDER — IOHEXOL 300 MG/ML  SOLN
100.0000 mL | Freq: Once | INTRAMUSCULAR | Status: AC | PRN
  Administered 2012-12-20: 100 mL via INTRAVENOUS

## 2012-12-20 NOTE — Assessment & Plan Note (Signed)
Ongoing with prev nl labs  Will order CT abd/pelvis with contrast inst to seek care if symptoms worsen

## 2012-12-20 NOTE — Assessment & Plan Note (Signed)
By Eustace Pen this appears to have resolved after course of keflex- but due to ongoing dysuria will cx urine If no imp recommend pelvic exam with PCP

## 2012-12-21 LAB — URINE CULTURE: Colony Count: 6000

## 2012-12-28 ENCOUNTER — Encounter: Payer: Self-pay | Admitting: Family Medicine

## 2012-12-28 ENCOUNTER — Ambulatory Visit (INDEPENDENT_AMBULATORY_CARE_PROVIDER_SITE_OTHER): Payer: Medicare Other | Admitting: Family Medicine

## 2012-12-28 VITALS — BP 118/80 | HR 72 | Temp 98.3°F | Ht 64.0 in | Wt 133.0 lb

## 2012-12-28 DIAGNOSIS — N390 Urinary tract infection, site not specified: Secondary | ICD-10-CM

## 2012-12-28 DIAGNOSIS — G8929 Other chronic pain: Secondary | ICD-10-CM

## 2012-12-28 DIAGNOSIS — K5900 Constipation, unspecified: Secondary | ICD-10-CM

## 2012-12-28 NOTE — Progress Notes (Signed)
  Subjective:    Patient ID: Tiffany [name], female    DOB: [birth year], 67 y.o.   MRN: [id number]  HPI  Here for f/u from ER visit and urology visit on 6/30   Pt was tx by urologist Dr Donney Rankins for uti (cath specimen) with keflex  Had bad enough abd pain to go to [facility]  Left without being seen due to length of wait for that  Her labs showed slt low na and K  Repeat was better   Saw Dr. Milinda Antis for dysuria and abdominal pain.  Abd pelvic CT was negative except heavy stool burden. U culture: neg 7/11 Used mirilax, stool softner, suppository.  Today she reports  She feels better. Abdominal pain is improved. No further dysuria.  She is only having BM once a week, feel full. She feels she exercises, drinks a lot of water and eats high fiber diet. Metamucil has not helped.   Chemistry       Review of Systems  Constitutional: Negative for fever and fatigue.  HENT: Negative for ear pain.   Eyes: Negative for pain.  Respiratory: Negative for chest tightness and shortness of breath.   Cardiovascular: Negative for chest pain, palpitations and leg swelling.  Gastrointestinal: Negative for abdominal pain.  Genitourinary: Negative for dysuria.       Objective:   Physical Exam  Constitutional: Vital signs are normal. She appears well-developed and well-nourished. She is cooperative.  Non-toxic appearance. She does not appear ill. No distress.  HENT:  Head: Normocephalic.  Right Ear: Hearing, tympanic membrane, external ear and ear canal normal. Tympanic membrane is not erythematous, not retracted and not bulging.  Left Ear: Hearing, tympanic membrane, external ear and ear canal normal. Tympanic membrane is not erythematous, not retracted and not bulging.  Nose: No mucosal edema or rhinorrhea. Right sinus exhibits no maxillary sinus tenderness and no frontal sinus tenderness. Left sinus exhibits no maxillary sinus tenderness and no frontal sinus tenderness.  Mouth/Throat: Uvula  is midline, oropharynx is clear and moist and mucous membranes are normal.  Eyes: Conjunctivae, EOM and lids are normal. Pupils are equal, round, and reactive to light. No foreign bodies found.  Neck: Trachea normal and normal range of motion. Neck supple. Carotid bruit is not present. No mass and no thyromegaly present.  Cardiovascular: Normal rate, regular rhythm, S1 normal, S2 normal, normal heart sounds, intact distal pulses and normal pulses.  Exam reveals no gallop and no friction rub.   No murmur heard. Pulmonary/Chest: Effort normal and breath sounds normal. Not tachypneic. No respiratory distress. She has no decreased breath sounds. She has no wheezes. She has no rhonchi. She has no rales.  Abdominal: Soft. Normal appearance and bowel sounds are normal. There is no tenderness.  Neurological: She is alert.  Skin: Skin is warm, dry and intact. No rash noted.  Psychiatric: Her speech is normal and behavior is normal. Judgment and thought content normal. Her mood appears not anxious. Cognition and memory are normal. She does not exhibit a depressed mood.          Assessment & Plan:

## 2012-12-28 NOTE — Assessment & Plan Note (Signed)
Limit narcotic as they can contribute to constipation.

## 2012-12-28 NOTE — Patient Instructions (Addendum)
Limit oxycodone as can cause constipation.  Increase water and fiber in diet. Probiotic (Align or pother lactobaccili) for stool regularity. Try milk of magnesia as needed. Walk to get stool moving.

## 2012-12-28 NOTE — Assessment & Plan Note (Signed)
Improved pain. Continue to work on diet and increasing fiber and water. Increase exercsie. Trial of align.  Milk of magnesia if backed up.

## 2012-12-28 NOTE — Assessment & Plan Note (Signed)
Resolved

## 2013-01-03 ENCOUNTER — Ambulatory Visit: Payer: Medicare Other | Admitting: Family Medicine

## 2013-01-07 ENCOUNTER — Other Ambulatory Visit: Payer: Self-pay | Admitting: *Deleted

## 2013-01-07 MED ORDER — ALENDRONATE SODIUM 70 MG PO TABS: 70.0000 mg | ORAL_TABLET | ORAL | Status: DC

## 2013-03-19 ENCOUNTER — Ambulatory Visit (INDEPENDENT_AMBULATORY_CARE_PROVIDER_SITE_OTHER): Payer: Medicare Other

## 2013-03-19 DIAGNOSIS — Z23 Encounter for immunization: Secondary | ICD-10-CM

## 2013-04-09 ENCOUNTER — Ambulatory Visit (INDEPENDENT_AMBULATORY_CARE_PROVIDER_SITE_OTHER): Payer: Medicare Other | Admitting: Cardiology

## 2013-04-09 ENCOUNTER — Encounter: Payer: Self-pay | Admitting: Cardiology

## 2013-04-09 VITALS — BP 122/80 | HR 90 | Ht 65.0 in | Wt 133.3 lb

## 2013-04-09 DIAGNOSIS — I10 Essential (primary) hypertension: Secondary | ICD-10-CM

## 2013-04-09 DIAGNOSIS — R002 Palpitations: Secondary | ICD-10-CM

## 2013-04-09 DIAGNOSIS — I059 Rheumatic mitral valve disease, unspecified: Secondary | ICD-10-CM

## 2013-04-09 DIAGNOSIS — R079 Chest pain, unspecified: Secondary | ICD-10-CM

## 2013-04-09 DIAGNOSIS — E785 Hyperlipidemia, unspecified: Secondary | ICD-10-CM

## 2013-04-09 MED ORDER — METOPROLOL TARTRATE 50 MG PO TABS: 50.0000 mg | ORAL_TABLET | Freq: Two times a day (BID) | ORAL | Status: DC

## 2013-04-09 MED ORDER — PANTOPRAZOLE SODIUM 40 MG PO TBEC: 40.0000 mg | DELAYED_RELEASE_TABLET | Freq: Every day | ORAL | Status: DC

## 2013-04-09 NOTE — Progress Notes (Signed)
PATIENT: Tiffany [name] MRN: [id number]  DOB: 1946-02-21   DOV:04/11/2013 PCP: Kerby Nora, MD  Clinic Note: Chief Complaint  Patient presents with  . 13 month visit    chest pain  all the ime , kind of SOB, no edema-; worked in her garden yesterday    HPI: Tiffany [name] is a 67 y.o. female with a PMH below who presents today for one-year followup. She is a 4 patient of Dr. Susa Griffins, whom he was following for her palpitations and a personal history of mitral valve prolapse. She has this diagnosis however her last several echocardiograms did not suggest the presence of mitral prolapse and only had mild MR..  Interval History: She presents today for annual followup is doing relatively well. She every now and then has palpitations and skipped beats but that'll last for a longer we worrisome to her. She takes Lopressor twice a day in the usual acute structural. She only gets short of breath she really pushes herself with exertion, but otherwise is relatively asymptomatic with no chest pressure at rest or exertion. No dyspnea with normal exertion. She does intermittently have some episodes of pressure described by Dr. Alanda Amass, but has not had any recently. She is otherwise very active and ambulatory, does not do any regular exercise.  The remainder of Cardiovascular ROS: positive for - chest pain, irregular heartbeat, palpitations and She notes diffuse aches and pains in her chest all the time, mostly musculoskeletal in nature. negative for - edema, loss of consciousness, murmur, orthopnea, paroxysmal nocturnal dyspnea, rapid heart rate or shortness of breath: Additional cardiac review of systems Lightheadedness - no, dizziness - no, syncope/near-syncope - no; TIA/amaurosis fugax - no Melena - no, hematochezia no; hematuria - no; nosebleeds - no; claudication - no  Past Medical History  Diagnosis Date  . GERD (gastroesophageal reflux disease)   . Hyperlipemia   .  Osteoporosis 07/15/1999  . Anxiety and depression   . History of MRI of cervical spine 09/98    Dr. Elesa Hacker  . History of MRI of lumbar spine 10/1998    Dr. Elesa Hacker  . Arthritis     knee  . Dupuytren's contracture of hand   . Hypertension   . Heart murmur   . Esophageal spasm   . Plantar fasciitis, bilateral     Prior Cardiac Evaluation and Past Surgical History: Past Surgical History  Procedure Laterality Date  . Lumbar spine surgery  1981  . Tubal ligation  1975    benign tumor  . Tonsillectomy and adenoidectomy  as a child  . Abd ultrasound  03/02/1999    NML, no gallstones  . 2 d echo  08/1999~04/13/2006    Mild MVP, MILD MR, Mild aortic sclerosis  . Spirometry  06/2004    NML  . Cardiac catheterization  01/2001    Nonobstructive CAD  . Esophagogastroduodenoscopy      Sliding H. H. ~ 07/1995-11/2003 Neg  . Knee surgery    . Esophagogastroduodenoscopy N/A 08/10/2012    Procedure: ESOPHAGOGASTRODUODENOSCOPY (EGD);  Surgeon: Hart Carwin, MD;  Location: [facility];  Service: Endoscopy;  Laterality: N/A;  . Savory dilation N/A 08/10/2012    Procedure: SAVORY DILATION;  Surgeon: Hart Carwin, MD;  Location: [facility];  Service: Endoscopy;  Laterality: N/A;  . Transthoracic echocardiogram  02/11/2011    Normal LV Size & function; ef ~60-65%; grade 1 diastolic dysfunction. MILD MAC no mitral stenosis and trace mitral regurgitation, no comment of prolapse  .  Nm lexiscan myoview ltd  03/26/2012    LOW RISK; no ischemia or infarction. Gut attenuation.    Allergies  Allergen Reactions  . Latex Anaphylaxis    REACTION: HIVES  . Aspirin     REACTION: SWELLING  . Codeine     REACTION: SWELLING  . Fexofenadine     REACTION: VOMITING  . Nsaids     REACTION: SWELLING  . Tramadol     REACTION: ITCH  . Zolpidem Tartrate     REACTION: unknown    Current Outpatient Prescriptions  Medication Sig Dispense Refill  . alendronate (FOSAMAX) 70 MG tablet Take 1 tablet (70 mg  total) by mouth every 7 (seven) days.  4 tablet  5  . ALPRAZolam (XANAX) 0.5 MG tablet Take 0.5 mg by mouth 3 (three) times daily as needed for sleep or anxiety.      . cetirizine (ZYRTEC ALLERGY) 10 MG tablet Take 1 tablet (10 mg total) by mouth daily.  90 tablet  3  . EPINEPHrine (EPIPEN IJ) Inject as directed as needed.      . methocarbamol (ROBAXIN-750) 750 MG tablet Take 750 mg by mouth 3 (three) times daily.        . metoprolol (LOPRESSOR) 50 MG tablet Take 1 tablet (50 mg total) by mouth 2 (two) times daily.  60 tablet  11  . oxyCODONE-acetaminophen (PERCOCET/ROXICET) 5-325 MG per tablet Take 1 tablet by mouth every 8 (eight) hours as needed for pain.      . pantoprazole (PROTONIX) 40 MG tablet Take 1 tablet (40 mg total) by mouth daily.  30 tablet  11  . SIMETHICONE PO Take 1 tablet by mouth 2 (two) times daily as needed. For heartburn       No current facility-administered medications for this visit.    History   Social History Narrative   67 year old, white woman, married mother of 3 with only one child living. She has 3 grandchildren. Her son that had spina bifida died at age 69 11/08/2009).   Never smoked. Does not drink alcohol.          ROS: A comprehensive Review of Systems - Negative except Exercise Limited due to 2 pain in her left leg where she had a fracture of her tibia and fibula.. She is additionally has some GERD-type reflux symptoms. Node GI bleeding issues. She does have anxiety and depression concerns, but not any worse than usual.  PHYSICAL EXAM BP 122/80  Pulse 90  Ht 5\' 5"  (1.651 m)  Wt 133 lb 4.8 oz (60.464 kg)  BMI 22.18 kg/m2 General appearance: alert, cooperative, appears stated age, no distress and Pleasant mood and affect. Has a flat, but not depressed affect. Well-nourished and well-groomed. Neck: no adenopathy, no carotid bruit, no JVD and supple, symmetrical, trachea midline Lungs: clear to auscultation bilaterally, normal percussion bilaterally and  Nonlabored, good air movement. Heart: RRR with normal S1 and as 2. The S1 may be mildly physiologically split. I do not hear a click, and there is a slight trace systolic murmur at left lower sternal border. No diastolic murmurs or rubs or gallops.. Nondisplaced PMI Abdomen: soft, non-tender; bowel sounds normal; no masses,  no organomegaly Extremities: extremities normal, atraumatic, no cyanosis or edema Pulses: 2+ and symmetric Neurologic: Grossly normal HEENT: [place]/AT, EOMI, MMM, anicteric sclera  YNW:[id number] today: Yes Rate: 90 , Rhythm: Normal sinus rhythm, low voltage but otherwise normal ECG;    Recent Labs: None available.  ASSESSMENT / PLAN: Rapid palpitations C.  the relatively well-controlled. She is on a pretty good dose of Toprol. She takes an additional half a dose if she needs to when necessary basis. These symptoms really don't bother her that much. Dr. Alanda Amass had thought about the possibility of doing 50 twice a day with Toprol to help necessary with worsening symptoms.  Mitral valve disorders She carries the diagnosis of mitral valve prolapse, however the last 2 echocardiograms that she's had done in 2000 9012 did not suggest prolapse it suggested some thickening of the mitral valve leaflets with maybe Mynx abdomen but not any evidence of prolapse. There is only trace to mild regurgitation. There is no need for her to be prophylaxed with antibiotics based on these findings.  Essential hypertension, benign Well-controlled her meds. Followed by PCP  HYPERLIPIDEMIA Monitored by PCP. Not on any medication regimen.  Chest pain Evaluated with a stress test in October of last year that was negative for ischemia or infarction.  as Dr. Alanda Amass felt think this is probably either musculoskeletal/costochondritis type pain versus GERD. She is on pantoprazole.     Orders Placed This Encounter  Procedures  . EKG 12-Lead   Meds ordered this encounter  Medications  .  metoprolol (LOPRESSOR) 50 MG tablet    Sig: Take 1 tablet (50 mg total) by mouth 2 (two) times daily.    Dispense:  60 tablet    Refill:  11    Followup: In one year  DAVID W. Herbie Baltimore, M.D., M.S. [facility] [address] [place], [place]  [address]  [phone] Pager # [phone]

## 2013-04-09 NOTE — Patient Instructions (Signed)
Your physician wants you to follow-up in12 MONTH  DR Herbie Baltimore     You will receive a reminder letter in the mail two months in advance. If you don't receive a letter, please call our office to schedule the follow-up appointment.

## 2013-04-11 ENCOUNTER — Encounter: Payer: Self-pay | Admitting: Cardiology

## 2013-04-11 DIAGNOSIS — R002 Palpitations: Secondary | ICD-10-CM | POA: Insufficient documentation

## 2013-04-11 NOTE — Assessment & Plan Note (Signed)
She carries the diagnosis of mitral valve prolapse, however the last 2 echocardiograms that she's had done in 2000 9012 did not suggest prolapse it suggested some thickening of the mitral valve leaflets with maybe Mynx abdomen but not any evidence of prolapse. There is only trace to mild regurgitation. There is no need for her to be prophylaxed with antibiotics based on these findings.

## 2013-04-11 NOTE — Assessment & Plan Note (Signed)
Monitored by PCP. Not on any medication regimen.

## 2013-04-11 NOTE — Assessment & Plan Note (Signed)
Evaluated with a stress test in October of last year that was negative for ischemia or infarction.  as Dr. Alanda Amass felt think this is probably either musculoskeletal/costochondritis type pain versus GERD. She is on pantoprazole.

## 2013-04-11 NOTE — Assessment & Plan Note (Signed)
Well-controlled her meds. Followed by PCP

## 2013-04-11 NOTE — Assessment & Plan Note (Signed)
C. the relatively well-controlled. She is on a pretty good dose of Toprol. She takes an additional half a dose if she needs to when necessary basis. These symptoms really don't bother her that much. Dr. Alanda Amass had thought about the possibility of doing 50 twice a day with Toprol to help necessary with worsening symptoms.

## 2013-05-14 ENCOUNTER — Other Ambulatory Visit: Payer: Self-pay | Admitting: *Deleted

## 2013-08-13 ENCOUNTER — Ambulatory Visit (INDEPENDENT_AMBULATORY_CARE_PROVIDER_SITE_OTHER): Payer: Medicare Other | Admitting: Internal Medicine

## 2013-08-13 ENCOUNTER — Encounter: Payer: Self-pay | Admitting: Internal Medicine

## 2013-08-13 VITALS — BP 122/78 | HR 82 | Temp 98.4°F | Wt 134.0 lb

## 2013-08-13 DIAGNOSIS — I7300 Raynaud's syndrome without gangrene: Secondary | ICD-10-CM

## 2013-08-13 MED ORDER — AMLODIPINE BESYLATE 5 MG PO TABS: 5.0000 mg | ORAL_TABLET | Freq: Every day | ORAL | Status: DC

## 2013-08-13 NOTE — Progress Notes (Signed)
Subjective:    Patient ID: Tiffany [name], female    DOB: 1945-10-29, 68 y.o.   MRN: [id number]  HPI  Pt presents to the clinic today with c/o right hand pain. She reports this started 5 days ago. She reports that her arm felt heavy and she had no feeling in her hand for a few minutes. She can move her arm fine but still c/o of swelling of the right hand. She also feels like "briars" are sticking in her hand. She does have a history of raynaud's.  Review of Systems      Past Medical History  Diagnosis Date  . GERD (gastroesophageal reflux disease)   . Hyperlipemia   . Osteoporosis 07/15/1999  . Anxiety and depression   . History of MRI of cervical spine 09/98    Dr. Rolin Barry  . History of MRI of lumbar spine 10/1998    Dr. Rolin Barry  . Arthritis     knee  . Dupuytren's contracture of hand   . Hypertension   . Heart murmur   . Esophageal spasm   . Plantar fasciitis, bilateral     Current Outpatient Prescriptions  Medication Sig Dispense Refill  . alendronate (FOSAMAX) 70 MG tablet Take 1 tablet (70 mg total) by mouth every 7 (seven) days.  4 tablet  5  . ALPRAZolam (XANAX) 0.5 MG tablet Take 0.5 mg by mouth 3 (three) times daily as needed for sleep or anxiety.      . cetirizine (ZYRTEC ALLERGY) 10 MG tablet Take 1 tablet (10 mg total) by mouth daily.  90 tablet  3  . EPINEPHrine (EPIPEN IJ) Inject as directed as needed.      . methocarbamol (ROBAXIN-750) 750 MG tablet Take 750 mg by mouth 3 (three) times daily.        . metoprolol (LOPRESSOR) 50 MG tablet Take 1 tablet (50 mg total) by mouth 2 (two) times daily.  60 tablet  11  . oxyCODONE-acetaminophen (PERCOCET/ROXICET) 5-325 MG per tablet Take 1 tablet by mouth every 8 (eight) hours as needed for pain.      . pantoprazole (PROTONIX) 40 MG tablet Take 1 tablet (40 mg total) by mouth daily.  30 tablet  11  . SIMETHICONE PO Take 1 tablet by mouth 2 (two) times daily as needed. For heartburn       No current  facility-administered medications for this visit.    Allergies  Allergen Reactions  . Latex Anaphylaxis    REACTION: HIVES  . Aspirin     REACTION: SWELLING  . Codeine     REACTION: SWELLING  . Fexofenadine     REACTION: VOMITING  . Nsaids     REACTION: SWELLING  . Tramadol     REACTION: ITCH  . Zolpidem Tartrate     REACTION: unknown    Family History  Problem Relation Age of Onset  . Breast cancer Mother   . Throat cancer Father     History   Social History  . Marital Status: Married    Spouse Name: N/A    Number of Children: 2  . Years of Education: N/A   Occupational History  . Nurse's aid    Social History Main Topics  . Smoking status: Never Smoker   . Smokeless tobacco: Never Used  . Alcohol Use: No  . Drug Use: No  . Sexual Activity: Not on file   Other Topics Concern  . Not on file   Social History Narrative  68 year old, white woman, married mother of 3 with only one child living. She has 3 grandchildren. Her son that had spina bifida died at age 52 2009/08/25).   Never smoked. Does not drink alcohol.           Constitutional: Denies fever, malaise, fatigue, headache or abrupt weight changes.  Musculoskeletal: Pt reports right hand swelling and pain. Denies difficulty with gait, muscle pain.  Skin: Denies redness, rashes, lesions or ulcercations.    No other specific complaints in a complete review of systems (except as listed in HPI above).  Objective:   Physical Exam   BP 122/78  Pulse 82  Temp(Src) 98.4 F (36.9 C) (Oral)  Wt 134 lb (60.782 kg) Wt Readings from Last 3 Encounters:  08/13/13 134 lb (60.782 kg)  04/09/13 133 lb 4.8 oz (60.464 kg)  12/28/12 133 lb (60.328 kg)    General: Appears her stated age, chronically ill appearing in NAD. Skin: Right fingertips cool to touch, pale. Left fingertips warm and dry. Cardiovascular: Normal rate and rhythm. S1,S2 noted.  No murmur, rubs or gallops noted. No JVD or BLE edema. No  carotid bruits noted. Radial pulses 1 + bilaterally. Cap refill < 3 sec on left, 4-5 on right. Pulmonary/Chest: Normal effort and positive vesicular breath sounds. No respiratory distress. No wheezes, rales or ronchi noted.  Musculoskeletal: Decreased flexion and extension of the fingers on the right hand. 1 + swelling of the right hand noted.    BMET    Component Value Date/Time   NA 135 12/17/2012 0937   K 3.4* 12/17/2012 0937   CL 96 12/17/2012 0937   CO2 28 12/17/2012 0937   GLUCOSE 104* 12/17/2012 0937   BUN 5* 12/17/2012 0937   CREATININE 0.9 12/17/2012 0937   CALCIUM 8.7 12/17/2012 0937   GFRNONAA >60 09/06/2010 0535   GFRAA  Value: >60        The eGFR has been calculated using the MDRD equation. This calculation has not been validated in all clinical situations. eGFR's persistently <60 mL/min signify possible Chronic Kidney Disease. 09/06/2010 0535    Lipid Panel     Component Value Date/Time   CHOL 236* 01/12/2012 1138   TRIG 112.0 01/12/2012 1138   HDL 72.10 01/12/2012 1138   CHOLHDL 3 01/12/2012 1138   VLDL 22.4 01/12/2012 1138   LDLCALC 60 03/23/2010 1425    CBC    Component Value Date/Time   WBC 8.4 12/10/2012 1448   RBC 4.47 12/10/2012 1448   HGB 15.6* 12/10/2012 1509   HCT 46.0 12/10/2012 1509   PLT 270 12/10/2012 1448   MCV 91.3 12/10/2012 1448   MCH 32.0 12/10/2012 1448   MCHC 35.0 12/10/2012 1448   RDW 12.5 12/10/2012 1448   LYMPHSABS 2.4 12/10/2012 1448   MONOABS 0.7 12/10/2012 1448   EOSABS 0.1 12/10/2012 1448   BASOSABS 0.0 12/10/2012 1448    Hgb A1C No results found for this basename: HGBA1C        Assessment & Plan:   [name] syndrome:  Will start amlodipine 5 mg daily Monitor your blood pressure at home If you develop worsening pain or pallor in the hand, go to the ER immediately  RTC as needed or if symptoms persist or worsen

## 2013-08-13 NOTE — Patient Instructions (Signed)
[name] Syndrome [name] Syndrome is a disorder of the blood vessels in your hands and feet. It occurs when small arteries of the arms/hands or legs/feet become sensitive to cold or emotional upset. This causes the arteries to constrict, or narrow, and reduces blood flow to the area. The color in the fingers or toes changes from white to bluish to red and this is not usually painful. There may be numbness and tingling. Sores on the skin (ulcers) can form. Symptoms are usually relieved by warming. HOME CARE INSTRUCTIONS   Avoid exposure to cold. Keep your whole body warm and dry. Dress in layers. Wear mittens or gloves when handling ice or frozen food and when outdoors. Use holders for glasses or cans containing cold drinks. If possible, stay indoors during cold weather.  Limit your use of caffeine. Switch to decaffeinated coffee, tea, and soda pop. Avoid chocolate.  Avoid smoking or being around cigarette smoke. Smoke will make symptoms worse.  Wear loose fitting socks and comfortable, roomy shoes.  Avoid vibrating tools and machinery.  If possible, avoid stressful and emotional situations. Exercise, meditation and yoga may help you cope with stress. Biofeedback may be useful.  Ask your caregiver about medicine (calcium channel blockers) that may control [name] phenomena. SEEK MEDICAL CARE IF:   Your discomfort becomes worse, despite conservative treatment.  You develop sores on your fingers and toes that do not heal. Document Released: 05/27/2000 Document Revised: 08/22/2011 Document Reviewed: 06/03/2008 [employer] Patient Information 2014 [employer].  

## 2013-08-13 NOTE — Progress Notes (Signed)
Pre visit review using our clinic review tool, if applicable. No additional management support is needed unless otherwise documented below in the visit note. 

## 2013-08-20 ENCOUNTER — Telehealth: Payer: Self-pay

## 2013-08-20 DIAGNOSIS — I7300 Raynaud's syndrome without gangrene: Secondary | ICD-10-CM

## 2013-08-20 MED ORDER — AMLODIPINE BESYLATE 2.5 MG PO TABS: 5.0000 mg | ORAL_TABLET | Freq: Every day | ORAL | Status: DC

## 2013-08-20 MED ORDER — AMLODIPINE BESYLATE 2.5 MG PO TABS: 2.5000 mg | ORAL_TABLET | Freq: Every day | ORAL | Status: DC

## 2013-08-20 NOTE — Telephone Encounter (Signed)
Yes , decrease amlodipine to 2.5 mg daily. rx sent in. Continue to follow BP at home.  Medication is for raynaud's syndrome. No other meds to treat.

## 2013-08-20 NOTE — Addendum Note (Signed)
Addended by: Damita LackLORING, DONNA S on: 08/20/2013 04:11 PM   Modules accepted: Orders

## 2013-08-20 NOTE — Telephone Encounter (Signed)
Pt left v/m; pt was seen on 08/13/13 and was started on amlodipine 5 mg taking one daily; pt monitoring BP at home and BP running low; BP has been 80/50 and 100/48. Pt said she has very low energy level on amlodipine. Pt wants to know if can lower amlodipine or change medication. [facility]

## 2013-08-20 NOTE — Telephone Encounter (Signed)
Patient notified as instructed by telephone. 

## 2013-10-01 ENCOUNTER — Other Ambulatory Visit: Payer: Self-pay | Admitting: Family Medicine

## 2014-02-20 ENCOUNTER — Ambulatory Visit (INDEPENDENT_AMBULATORY_CARE_PROVIDER_SITE_OTHER): Payer: Medicare Other | Admitting: Internal Medicine

## 2014-02-20 ENCOUNTER — Encounter: Payer: Self-pay | Admitting: Internal Medicine

## 2014-02-20 VITALS — BP 130/74 | HR 93 | Temp 97.7°F | Wt 127.0 lb

## 2014-02-20 DIAGNOSIS — R1115 Cyclical vomiting syndrome unrelated to migraine: Secondary | ICD-10-CM

## 2014-02-20 DIAGNOSIS — R1110 Vomiting, unspecified: Secondary | ICD-10-CM

## 2014-02-20 DIAGNOSIS — R197 Diarrhea, unspecified: Secondary | ICD-10-CM

## 2014-02-20 LAB — COMPREHENSIVE METABOLIC PANEL
ALBUMIN: 4.4 g/dL (ref 3.5–5.2)
ALT: 14 U/L (ref 0–35)
AST: 20 U/L (ref 0–37)
Alkaline Phosphatase: 88 U/L (ref 39–117)
BUN: 7 mg/dL (ref 6–23)
CALCIUM: 9.6 mg/dL (ref 8.4–10.5)
CHLORIDE: 102 meq/L (ref 96–112)
CO2: 29 mEq/L (ref 19–32)
Creatinine, Ser: 0.9 mg/dL (ref 0.4–1.2)
GFR: 65.81 mL/min (ref >60.00)
Glucose, Bld: 94 mg/dL (ref 70–99)
POTASSIUM: 4.2 meq/L (ref 3.5–5.1)
Sodium: 139 mEq/L (ref 135–145)
Total Bilirubin: 0.5 mg/dL (ref 0.2–1.2)
Total Protein: 7.7 g/dL (ref 6.0–8.3)

## 2014-02-20 LAB — CBC
HCT: 40.8 % (ref 36.0–46.0)
Hemoglobin: 13.4 g/dL (ref 12.0–15.0)
MCHC: 32.9 g/dL (ref 30.0–36.0)
MCV: 94.4 fl (ref 78.0–100.0)
Platelets: 321 10*3/uL (ref 150.0–400.0)
RBC: 4.32 Mil/uL (ref 3.87–5.11)
RDW: 13.4 % (ref 11.5–15.5)
WBC: 7.8 10*3/uL (ref 4.0–10.5)

## 2014-02-20 MED ORDER — ONDANSETRON HCL 4 MG PO TABS: 4.0000 mg | ORAL_TABLET | Freq: Three times a day (TID) | ORAL | Status: DC | PRN

## 2014-02-20 NOTE — Progress Notes (Signed)
Subjective:    Patient ID: Tiffany [name], female    DOB: [birth year], 68 y.o.   MRN: [id number]  HPI Patient presents with diarrhea, nausea and vomiting for the past 4 days.  She reports 4 episodes of watery diarrhea a day, nocturnal diarrhea and denies blood.  She reports foul smell to diarhea and gas.  She reports nausea and vomiting with anything she tries to eat or drink, including ice chips.  She denies fever or chills. She has been eating lots of vegetables from her garden.  She did eat the breakfast buffet at [facility] Saturday morning (2 days prior to onset)  She has had one diarrhea episode today and vomited today after trying to eat lunch.  She has not taken anything for releif.  No recent antibiotic use, no recent travel, no sick contacts.   Review of Systems  Past Medical History  Diagnosis Date  . GERD (gastroesophageal reflux disease)   . Hyperlipemia   . Osteoporosis 07/15/1999  . Anxiety and depression   . History of MRI of cervical spine 09/98    Dr. Rolin Barry  . History of MRI of lumbar spine 10/1998    Dr. Rolin Barry  . Arthritis     knee  . Dupuytren's contracture of hand   . Hypertension   . Heart murmur   . Esophageal spasm   . Plantar fasciitis, bilateral     Current Outpatient Prescriptions  Medication Sig Dispense Refill  . alendronate (FOSAMAX) 70 MG tablet Take 1 tablet (70 mg total) by mouth every 7 (seven) days.  4 tablet  5  . ALPRAZolam (XANAX) 0.5 MG tablet Take 0.5 mg by mouth 3 (three) times daily as needed for sleep or anxiety.      [name] amLODipine (NORVASC) 2.5 MG tablet Take 1 tablet (2.5 mg total) by mouth daily.  30 tablet  0  . cetirizine (ZYRTEC) 10 MG tablet TAKE ONE (1) TABLET BY MOUTH EVERY DAY  90 tablet  0  . EPINEPHrine (EPIPEN IJ) Inject as directed as needed.      . methocarbamol (ROBAXIN-750) 750 MG tablet Take 750 mg by mouth 3 (three) times daily.        . metoprolol (LOPRESSOR) 50 MG tablet Take 1 tablet (50 mg total) by mouth  2 (two) times daily.  60 tablet  11  . oxyCODONE-acetaminophen (PERCOCET/ROXICET) 5-325 MG per tablet Take 1 tablet by mouth every 8 (eight) hours as needed for pain.      . pantoprazole (PROTONIX) 40 MG tablet Take 1 tablet (40 mg total) by mouth daily.  30 tablet  11   No current facility-administered medications for this visit.    Allergies  Allergen Reactions  . Latex Anaphylaxis    REACTION: HIVES  . Aspirin     REACTION: SWELLING  . Codeine     REACTION: SWELLING  . Fexofenadine     REACTION: VOMITING  . Nsaids     REACTION: SWELLING  . Tramadol     REACTION: ITCH  . Zolpidem Tartrate     REACTION: unknown    Family History  Problem Relation Age of Onset  . Breast cancer Mother   . Throat cancer Father     History   Social History  . Marital Status: Married    Spouse Name: N/A    Number of Children: 2  . Years of Education: N/A   Occupational History  . Nurse's aid    Social History Main Topics  .  Smoking status: Never Smoker   . Smokeless tobacco: Never Used  . Alcohol Use: No  . Drug Use: No  . Sexual Activity: Not on file   Other Topics Concern  . Not on file   Social History Narrative   68 year old, white woman, married mother of 3 with only one child living. She has 3 grandchildren. Her son that had spina bifida died at age 4 August 23, 2009).   Never smoked. Does not drink alcohol.           Constitutional: Denies, fatigue, headache or abrupt weight changes.  HEENT: Denies eye pain, eye redness, ear pain, ringing in the ears, wax buildup, nasal congestion, bloody nose, or sore throat. Respiratory: Denies difficulty breathing, shortness of breath, cough or sputum production.   Cardiovascular: Denies chest pain, chest tightness, palpitations or swelling in the hands or feet.  GU: Denies urgency, frequency, pain with urination, burning sensation, blood in urine, odor or discharge.  No other specific complaints in a complete review of systems (except  as listed in HPI above).     Objective:   Physical Exam  BP 130/74  Pulse 93  Temp(Src) 97.7 F (36.5 C) (Oral)  Wt 127 lb (57.607 kg)  SpO2 97% Wt Readings from Last 3 Encounters:  02/20/14 127 lb (57.607 kg)  08/13/13 134 lb (60.782 kg)  04/09/13 133 lb 4.8 oz (60.464 kg)    General: Appears their stated age, well developed, well nourished, appear ill but NAD HEENT:Throat/Mouth:, mucosa pink and moist, no exudate, lesions or ulcerations noted.  Neck:  Neck supple, trachea midline. No lymphadenopathy noted.  Cardiovascular: Normal rate and rhythm. S1,S2 noted.  No murmur, rubs or gallops noted. Pulmonary/Chest: Normal effort and positive vesicular breath sounds. No respiratory distress. No wheezes, rales or ronchi noted.  Abdomen: Generalized tenderness, hyperactive bowel sounds.  BMET    Component Value Date/Time   NA 135 12/17/2012 0937   K 3.4* 12/17/2012 0937   CL 96 12/17/2012 0937   CO2 28 12/17/2012 0937   GLUCOSE 104* 12/17/2012 0937   BUN 5* 12/17/2012 0937   CREATININE 0.9 12/17/2012 0937   CALCIUM 8.7 12/17/2012 0937   GFRNONAA >60 09/06/2010 0535   GFRAA  Value: >60        The eGFR has been calculated using the MDRD equation. This calculation has not been validated in all clinical situations. eGFR's persistently <60 mL/min signify possible Chronic Kidney Disease. 09/06/2010 0535    Lipid Panel     Component Value Date/Time   CHOL 236* 01/12/2012 1138   TRIG 112.0 01/12/2012 1138   HDL 72.10 01/12/2012 1138   CHOLHDL 3 01/12/2012 1138   VLDL 22.4 01/12/2012 1138   LDLCALC 60 03/23/2010 1425    CBC    Component Value Date/Time   WBC 8.4 12/10/2012 1448   RBC 4.47 12/10/2012 1448   HGB 15.6* 12/10/2012 1509   HCT 46.0 12/10/2012 1509   PLT 270 12/10/2012 1448   MCV 91.3 12/10/2012 1448   MCH 32.0 12/10/2012 1448   MCHC 35.0 12/10/2012 1448   RDW 12.5 12/10/2012 1448   LYMPHSABS 2.4 12/10/2012 1448   MONOABS 0.7 12/10/2012 1448   EOSABS 0.1 12/10/2012 1448   BASOSABS 0.0 12/10/2012  1448    Hgb A1C No results found for this basename: HGBA1C         Assessment & Plan:  1. Intractable vomiting with nausea, vomiting of unspecified type  - ondansetron (ZOFRAN) 4 MG tablet; Take 1 tablet (4  mg total) by mouth every 8 (eight) hours as needed for nausea or vomiting.  Dispense: 30 tablet; Refill: 0 - CBC - Comprehensive metabolic panel - C. difficile GDH and Toxin A/B - Stool culture  2. Diarrhea  - CBC - Comprehensive metabolic panel - C. difficile GDH and Toxin A/B - Stool culture

## 2014-02-20 NOTE — Progress Notes (Signed)
Subjective:    Patient ID: Tiffany [name], female    DOB: 1946-04-02, 68 y.o.   MRN: [id number]  HPI  Pt presents to the clinic today with c/o diarrhea. She reports this started 3 days ago. She describes her stool as watery and it does have a bad odor to it. She has had some associated nausea and vomiting. The nausea and vomiting occurs any times she eats or drinks anything. She denies blood in her stool. She denies fever, chills or body aches. She is passing gas. She has not tried anything OTC. She has not had sick contacts with similar symptoms. She has not started any new medications. She has not been on antibiotics recently.  Review of Systems      Past Medical History  Diagnosis Date  . GERD (gastroesophageal reflux disease)   . Hyperlipemia   . Osteoporosis 07/15/1999  . Anxiety and depression   . History of MRI of cervical spine 09/98    Dr. Rolin Barry  . History of MRI of lumbar spine 10/1998    Dr. Rolin Barry  . Arthritis     knee  . Dupuytren's contracture of hand   . Hypertension   . Heart murmur   . Esophageal spasm   . Plantar fasciitis, bilateral     Current Outpatient Prescriptions  Medication Sig Dispense Refill  . alendronate (FOSAMAX) 70 MG tablet Take 1 tablet (70 mg total) by mouth every 7 (seven) days.  4 tablet  5  . ALPRAZolam (XANAX) 0.5 MG tablet Take 0.5 mg by mouth 3 (three) times daily as needed for sleep or anxiety.      [name] amLODipine (NORVASC) 2.5 MG tablet Take 1 tablet (2.5 mg total) by mouth daily.  30 tablet  0  . cetirizine (ZYRTEC) 10 MG tablet TAKE ONE (1) TABLET BY MOUTH EVERY DAY  90 tablet  0  . EPINEPHrine (EPIPEN IJ) Inject as directed as needed.      . methocarbamol (ROBAXIN-750) 750 MG tablet Take 750 mg by mouth 3 (three) times daily.        . metoprolol (LOPRESSOR) 50 MG tablet Take 1 tablet (50 mg total) by mouth 2 (two) times daily.  60 tablet  11  . oxyCODONE-acetaminophen (PERCOCET/ROXICET) 5-325 MG per tablet Take 1 tablet by  mouth every 8 (eight) hours as needed for pain.      . pantoprazole (PROTONIX) 40 MG tablet Take 1 tablet (40 mg total) by mouth daily.  30 tablet  11   No current facility-administered medications for this visit.    Allergies  Allergen Reactions  . Latex Anaphylaxis    REACTION: HIVES  . Aspirin     REACTION: SWELLING  . Codeine     REACTION: SWELLING  . Fexofenadine     REACTION: VOMITING  . Nsaids     REACTION: SWELLING  . Tramadol     REACTION: ITCH  . Zolpidem Tartrate     REACTION: unknown    Family History  Problem Relation Age of Onset  . Breast cancer Mother   . Throat cancer Father     History   Social History  . Marital Status: Married    Spouse Name: N/A    Number of Children: 2  . Years of Education: N/A   Occupational History  . Nurse's aid    Social History Main Topics  . Smoking status: Never Smoker   . Smokeless tobacco: Never Used  . Alcohol Use: No  .  Drug Use: No  . Sexual Activity: Not on file   Other Topics Concern  . Not on file   Social History Narrative   68 year old, white woman, married mother of 3 with only one child living. She has 3 grandchildren. Her son that had spina bifida died at age 14 08/26/2009).   Never smoked. Does not drink alcohol.           Constitutional: Denies fever, malaise, fatigue, headache or abrupt weight changes.  HEENT: Denies eye pain, eye redness, ear pain, ringing in the ears, wax buildup, runny nose, nasal congestion, bloody nose, or sore throat. Respiratory: Denies difficulty breathing, shortness of breath, cough or sputum production.   Cardiovascular: Denies chest pain, chest tightness, palpitations or swelling in the hands or feet.  Gastrointestinal: Pt reports nausea, vomiting and diarrhea. Denies abdominal pain, bloating, constipation, or blood in the stool.    No other specific complaints in a complete review of systems (except as listed in HPI above).  Objective:   Physical Exam   BP  130/74  Pulse 93  Temp(Src) 97.7 F (36.5 C) (Oral)  Wt 127 lb (57.607 kg)  SpO2 97% Wt Readings from Last 3 Encounters:  02/20/14 127 lb (57.607 kg)  08/13/13 134 lb (60.782 kg)  04/09/13 133 lb 4.8 oz (60.464 kg)    General: Appears her stated age, well developed, well nourished in NAD. Cardiovascular: Normal rate and rhythm. S1,S2 noted.  No murmur, rubs or gallops noted.  Pulmonary/Chest: Normal effort and positive vesicular breath sounds. No respiratory distress. No wheezes, rales or ronchi noted.  Abdomen: Soft and generally tender. Hyperactive bowel sounds, no bruits noted. No distention or masses noted. Liver, spleen and kidneys non palpable.  BMET    Component Value Date/Time   NA 135 12/17/2012 0937   K 3.4* 12/17/2012 0937   CL 96 12/17/2012 0937   CO2 28 12/17/2012 0937   GLUCOSE 104* 12/17/2012 0937   BUN 5* 12/17/2012 0937   CREATININE 0.9 12/17/2012 0937   CALCIUM 8.7 12/17/2012 0937   GFRNONAA >60 09/06/2010 0535   GFRAA  Value: >60        The eGFR has been calculated using the MDRD equation. This calculation has not been validated in all clinical situations. eGFR's persistently <60 mL/min signify possible Chronic Kidney Disease. 09/06/2010 0535    Lipid Panel     Component Value Date/Time   CHOL 236* 01/12/2012 1138   TRIG 112.0 01/12/2012 1138   HDL 72.10 01/12/2012 1138   CHOLHDL 3 01/12/2012 1138   VLDL 22.4 01/12/2012 1138   LDLCALC 60 03/23/2010 1425    CBC    Component Value Date/Time   WBC 8.4 12/10/2012 1448   RBC 4.47 12/10/2012 1448   HGB 15.6* 12/10/2012 1509   HCT 46.0 12/10/2012 1509   PLT 270 12/10/2012 1448   MCV 91.3 12/10/2012 1448   MCH 32.0 12/10/2012 1448   MCHC 35.0 12/10/2012 1448   RDW 12.5 12/10/2012 1448   LYMPHSABS 2.4 12/10/2012 1448   MONOABS 0.7 12/10/2012 1448   EOSABS 0.1 12/10/2012 1448   BASOSABS 0.0 12/10/2012 1448    Hgb A1C No results found for this basename: HGBA1C        Assessment & Plan:   Nausea, vomiting and diarrhea:  Push  fluids Zofran for nausea Diet information given for diarrhea Will get CBC, CMET and stool culture, C diff Hold of on antidiarrheals at this time  If worse, go to the nearest ER  Will follow up after labs

## 2014-02-20 NOTE — Progress Notes (Signed)
Pre visit review using our clinic review tool, if applicable. No additional management support is needed unless otherwise documented below in the visit note. 

## 2014-02-20 NOTE — Patient Instructions (Addendum)

## 2014-02-25 NOTE — Addendum Note (Signed)
Addended by: Alvina Chou on: 02/25/2014 02:27 PM   Modules accepted: Orders

## 2014-02-26 ENCOUNTER — Ambulatory Visit: Payer: Medicare Other

## 2014-02-26 ENCOUNTER — Ambulatory Visit (INDEPENDENT_AMBULATORY_CARE_PROVIDER_SITE_OTHER): Payer: Medicare Other

## 2014-02-26 DIAGNOSIS — Z23 Encounter for immunization: Secondary | ICD-10-CM

## 2014-02-26 NOTE — Addendum Note (Signed)
Addended by: Alvina Chou on: 02/26/2014 11:02 AM   Modules accepted: Orders

## 2014-02-27 LAB — C. DIFFICILE GDH AND TOXIN A/B
C. DIFF TOXIN A/B: NOT DETECTED
C. difficile GDH: NOT DETECTED

## 2014-03-02 LAB — STOOL CULTURE

## 2014-04-09 ENCOUNTER — Other Ambulatory Visit: Payer: Self-pay | Admitting: Cardiology

## 2014-04-10 NOTE — Telephone Encounter (Signed)
Rx was sent to pharmacy electronically. 

## 2014-05-01 ENCOUNTER — Encounter: Payer: Self-pay | Admitting: Family Medicine

## 2014-05-01 ENCOUNTER — Ambulatory Visit (INDEPENDENT_AMBULATORY_CARE_PROVIDER_SITE_OTHER): Payer: Medicare Other | Admitting: Family Medicine

## 2014-05-01 VITALS — BP 138/80 | HR 86 | Temp 98.0°F | Ht 65.0 in | Wt 131.0 lb

## 2014-05-01 DIAGNOSIS — Z23 Encounter for immunization: Secondary | ICD-10-CM

## 2014-05-01 DIAGNOSIS — Z0000 Encounter for general adult medical examination without abnormal findings: Secondary | ICD-10-CM

## 2014-05-01 DIAGNOSIS — Z1211 Encounter for screening for malignant neoplasm of colon: Secondary | ICD-10-CM

## 2014-05-01 DIAGNOSIS — E785 Hyperlipidemia, unspecified: Secondary | ICD-10-CM

## 2014-05-01 DIAGNOSIS — M810 Age-related osteoporosis without current pathological fracture: Secondary | ICD-10-CM

## 2014-05-01 DIAGNOSIS — Z7189 Other specified counseling: Secondary | ICD-10-CM

## 2014-05-01 DIAGNOSIS — I10 Essential (primary) hypertension: Secondary | ICD-10-CM

## 2014-05-01 LAB — LIPID PANEL
Cholesterol: 227 mg/dL — ABNORMAL HIGH (ref 0–200)
HDL: 60.3 mg/dL (ref >39.00)
LDL Cholesterol: 142 mg/dL — ABNORMAL HIGH (ref 0–99)
NONHDL: 166.7
TRIGLYCERIDES: 124 mg/dL (ref 0.0–149.0)
Total CHOL/HDL Ratio: 4
VLDL: 24.8 mg/dL (ref 0.0–40.0)

## 2014-05-01 LAB — COMPREHENSIVE METABOLIC PANEL
ALBUMIN: 4.2 g/dL (ref 3.5–5.2)
ALT: 11 U/L (ref 0–35)
AST: 20 U/L (ref 0–37)
Alkaline Phosphatase: 93 U/L (ref 39–117)
BUN: 6 mg/dL (ref 6–23)
CALCIUM: 9.4 mg/dL (ref 8.4–10.5)
CO2: 28 meq/L (ref 19–32)
CREATININE: 0.8 mg/dL (ref 0.4–1.2)
Chloride: 102 mEq/L (ref 96–112)
GFR: 71.22 mL/min (ref >60.00)
Glucose, Bld: 96 mg/dL (ref 70–99)
Potassium: 3.6 mEq/L (ref 3.5–5.1)
Sodium: 140 mEq/L (ref 135–145)
Total Bilirubin: 0.6 mg/dL (ref 0.2–1.2)
Total Protein: 7.2 g/dL (ref 6.0–8.3)

## 2014-05-01 LAB — VITAMIN D 25 HYDROXY (VIT D DEFICIENCY, FRACTURES): VITD: 34.47 ng/mL (ref 30.00–100.00)

## 2014-05-01 MED ORDER — PANTOPRAZOLE SODIUM 40 MG PO TBEC: 40.0000 mg | DELAYED_RELEASE_TABLET | Freq: Every day | ORAL | Status: DC

## 2014-05-01 MED ORDER — NORTRIPTYLINE HCL 25 MG PO CAPS: 25.0000 mg | ORAL_CAPSULE | Freq: Every day | ORAL | Status: DC

## 2014-05-01 NOTE — Addendum Note (Signed)
Addended by: Sueanne MargaritaSMITH, Lysander Calixte L on: 05/01/2014 11:02 AM   Modules accepted: Orders
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
## 2014-05-01 NOTE — Progress Notes (Signed)
Pre visit review using our clinic review tool, if applicable. No additional management support is needed unless otherwise documented below in the visit note. 

## 2014-05-01 NOTE — Assessment & Plan Note (Signed)
Well controlled. Continue current medication.  

## 2014-05-01 NOTE — Assessment & Plan Note (Signed)
Due for re-eval. 

## 2014-05-01 NOTE — Patient Instructions (Addendum)
Stop at lab on way out. Pick up stool test.  Add amitriptyline  fr sleep and chronic pain.  Stop norvasc.

## 2014-05-02 ENCOUNTER — Telehealth: Payer: Self-pay | Admitting: Family Medicine

## 2014-05-02 NOTE — Telephone Encounter (Signed)
emmi emailed °

## 2014-05-12 ENCOUNTER — Telehealth: Payer: Self-pay | Admitting: Family Medicine

## 2014-05-12 NOTE — Telephone Encounter (Signed)
Mrs. Tiffany [name] given labs results by telephone.

## 2014-05-12 NOTE — Telephone Encounter (Signed)
Pt stated she does not have a computer and cannot get any message through my chart.  She would like a call with her lab results

## 2014-05-13 ENCOUNTER — Other Ambulatory Visit (INDEPENDENT_AMBULATORY_CARE_PROVIDER_SITE_OTHER): Payer: Medicare Other

## 2014-05-13 ENCOUNTER — Encounter: Payer: Self-pay | Admitting: *Deleted

## 2014-05-13 DIAGNOSIS — Z1211 Encounter for screening for malignant neoplasm of colon: Secondary | ICD-10-CM

## 2014-05-13 LAB — FECAL OCCULT BLOOD, IMMUNOCHEMICAL: Fecal Occult Bld: NEGATIVE

## 2014-05-16 ENCOUNTER — Other Ambulatory Visit: Payer: Self-pay | Admitting: Anesthesiology

## 2014-05-16 DIAGNOSIS — M25562 Pain in left knee: Secondary | ICD-10-CM

## 2014-05-20 ENCOUNTER — Encounter: Payer: Self-pay | Admitting: Family Medicine

## 2014-05-20 ENCOUNTER — Ambulatory Visit (INDEPENDENT_AMBULATORY_CARE_PROVIDER_SITE_OTHER): Payer: Medicare Other | Admitting: Family Medicine

## 2014-05-20 VITALS — BP 110/60 | HR 76 | Temp 98.4°F | Ht 65.0 in | Wt 130.5 lb

## 2014-05-20 DIAGNOSIS — F331 Major depressive disorder, recurrent, moderate: Secondary | ICD-10-CM

## 2014-05-20 DIAGNOSIS — G8929 Other chronic pain: Secondary | ICD-10-CM

## 2014-05-20 MED ORDER — NORTRIPTYLINE HCL 50 MG PO CAPS: 50.0000 mg | ORAL_CAPSULE | Freq: Every day | ORAL | Status: DC

## 2014-05-20 NOTE — Assessment & Plan Note (Signed)
Improved significantly with nortriptyline.  She has decreased her pain med. She has noted improvement in knee pain and swelling. No longer thinks she needs MRI of knee.. Will discuss with ORTHO.

## 2014-05-20 NOTE — Assessment & Plan Note (Signed)
Improved sleep and mood with nortriptiline using 50-75 mfg daily.

## 2014-05-20 NOTE — Progress Notes (Signed)
Pre visit review using our clinic review tool, if applicable. No additional management support is needed unless otherwise documented below in the visit note. 
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
## 2014-05-20 NOTE — Patient Instructions (Signed)
Increase nortriptyline to 50 mg at bedtime. Can use occ extra 25 mg dose as needed. Follow up as needed or in 1 year for wellness visit.

## 2014-05-28 ENCOUNTER — Other Ambulatory Visit: Payer: Medicare Other

## 2014-05-31 ENCOUNTER — Ambulatory Visit
Admission: RE | Admit: 2014-05-31 | Discharge: 2014-05-31 | Disposition: A | Discharge status: Home / Self Care | Payer: Medicare Other | Source: Ambulatory Visit | Attending: Anesthesiology | Admitting: Anesthesiology

## 2014-05-31 DIAGNOSIS — M25562 Pain in left knee: Secondary | ICD-10-CM

## 2014-06-26 ENCOUNTER — Encounter: Payer: Self-pay | Admitting: Cardiology

## 2014-06-26 ENCOUNTER — Ambulatory Visit (INDEPENDENT_AMBULATORY_CARE_PROVIDER_SITE_OTHER): Payer: Medicare Other | Admitting: Cardiology

## 2014-06-26 VITALS — BP 114/72 | HR 88 | Ht 65.0 in | Wt 131.9 lb

## 2014-06-26 DIAGNOSIS — R002 Palpitations: Secondary | ICD-10-CM | POA: Diagnosis not present

## 2014-06-26 DIAGNOSIS — E785 Hyperlipidemia, unspecified: Secondary | ICD-10-CM | POA: Diagnosis not present

## 2014-06-26 DIAGNOSIS — I10 Essential (primary) hypertension: Secondary | ICD-10-CM | POA: Diagnosis not present

## 2014-06-26 DIAGNOSIS — M25441 Effusion, right hand: Secondary | ICD-10-CM

## 2014-06-26 DIAGNOSIS — I059 Rheumatic mitral valve disease, unspecified: Secondary | ICD-10-CM | POA: Diagnosis not present

## 2014-06-26 NOTE — Patient Instructions (Signed)
Your physician wants you to follow-up in: 1 year with Dr.Harding.  You will receive a reminder letter in the mail two months in advance. If you don't receive a letter, please call our office to schedule the follow-up appointment.   Make sure to keep your hands warm when you are out in the cold and sleeping with a glove may help as well.

## 2014-06-28 ENCOUNTER — Encounter: Payer: Self-pay | Admitting: Cardiology

## 2014-06-28 DIAGNOSIS — M25441 Effusion, right hand: Secondary | ICD-10-CM | POA: Insufficient documentation

## 2014-06-28 NOTE — Assessment & Plan Note (Signed)
None on statin.  May want to consider more aggressive therapy. She does not seem to be doing well with lifestyle changes to treat. Target LDL should be at least less than 1:30 and total cholesterol less than 200. Lab Results  Component Value Date   CHOL 227* 05/01/2014   HDL 60.30 05/01/2014   LDLCALC 142* 05/01/2014   LDLDIRECT 138.7 01/12/2012   TRIG 124.0 05/01/2014   CHOLHDL 4 05/01/2014

## 2014-06-28 NOTE — Progress Notes (Signed)
PATIENT: Tiffany [name] MRN: [id number]  DOB: [birth year]   DOV:06/28/2014 PCP: Kerby Nora, MD  Clinic Note: Chief Complaint  Patient presents with  . Follow-up    last OV 03/2013 still having palpitations on occasion, dyspnea at random, swelling in hands    HPI: Tiffany [name] is a 69 y.o. female with a PMH below who presents today for what is supposed to be a one-year followup for palpitations. She is a former patient of Dr. Susa Griffins, whom he was following for her palpitations and a reported history of mitral valve prolapse from the cardiac catheterization in 2002.[name] She has this diagnosis however her last several echocardiograms did not suggest the presence of mitral prolapse and only had mild MR..  Interval History: She presents today for annual followup is doing relatively well. She still has palpitations and skipped beats, blood relatively rare. There is no sense of rapid heart rate and just several premature beats. Not associated with dizziness or syncope/near-syncope symptoms. Her palpitations have been what relatively well controlled on beta blocker.  She only gets short of breath she really pushes herself with exertion, but otherwise is relatively asymptomatic with no chest pressure at rest or exertion. No dyspnea with normal exertion. What she described as having random episodes of feeling dyspneic they can happen when doing something about doing something. It only lasts for a few seconds. She is relatively active and ambulatory, butdoes not do any regular exercise. She describes swelling in her hand mostly in the joints. Worse in the right hand and worse with old. She is small we'll calluses on the tips of her fingers. She feels as though her hands contracted. This is somewhat consistent with a Dupuytren's contracture, but also sounds somewhat like [name].  The remainder of Cardiovascular ROS: no chest pain or dyspnea on exertion positive for - irregular heartbeat,  palpitations and Hand but not lower extremity swelling negative for - edema, loss of consciousness, murmur, orthopnea, paroxysmal nocturnal dyspnea, rapid heart rate, shortness of breath or Less noticeable aches and pains in her chest.  No TIA/amaurosis fugax symptoms, syncope/near syncope.:  Past Medical History  Diagnosis Date  . GERD (gastroesophageal reflux disease)   . Hyperlipemia   . Osteoporosis 07/15/1999  . Anxiety and depression   . History of MRI of cervical spine 09/98    Dr. Elesa Hacker  . History of MRI of lumbar spine 10/1998    Dr. Elesa Hacker  . Arthritis     knee  . Dupuytren's contracture of hand   . Hypertension   . Heart murmur   . Esophageal spasm   . Plantar fasciitis, bilateral     Prior Cardiac Evaluation and Past Surgical History: Procedure Laterality Date  . Transthoracic echocardiogram  08/1999~04/13/2006    Mild MVP, MILD MR, Mild aortic sclerosis  . Cardiac catheterization  01/2001    Nonobstructive CAD; suggesting mitral prolapse  . Savory dilation N/A 08/10/2012    Procedure: SAVORY DILATION;  Surgeon: Hart Carwin, MD;  Location: [facility];  Service: Endoscopy;  Laterality: N/A;  . Transthoracic echocardiogram  02/11/2011    Normal LV Size & function; ef ~60-65%; grade 1 diastolic dysfunction. MILD MAC no mitral stenosis and trace mitral regurgitation, no comment of prolapse  . Nm lexiscan myoview ltd  03/26/2012    LOW RISK; no ischemia or infarction. Gut attenuation.    Allergies  Allergen Reactions  . Latex Anaphylaxis    REACTION: HIVES  . Aspirin  REACTION: SWELLING  . Codeine     REACTION: SWELLING  . Fexofenadine     REACTION: VOMITING  . Nsaids     REACTION: SWELLING  . Tramadol     REACTION: ITCH  . Zolpidem Tartrate     REACTION: unknown    Current Outpatient Prescriptions  Medication Sig Dispense Refill  . ALPRAZolam (XANAX) 0.5 MG tablet Take 0.5 mg by mouth 3 (three) times daily as needed for sleep or anxiety.    .  cetirizine (ZYRTEC) 10 MG tablet TAKE ONE (1) TABLET BY MOUTH EVERY DAY 90 tablet 0  . EPINEPHrine (EPIPEN IJ) Inject as directed as needed.    . methocarbamol (ROBAXIN-750) 750 MG tablet Take 750 mg by mouth 3 (three) times daily.      . metoprolol (LOPRESSOR) 50 MG tablet TAKE ONE (1) TABLET BY MOUTH TWO (2) TIMES DAILY 60 tablet 2  . nortriptyline (PAMELOR) 50 MG capsule Take 1 capsule (50 mg total) by mouth at bedtime. 90 capsule 1  . oxyCODONE (OXY IR/ROXICODONE) 5 MG immediate release tablet Take 5 mg by mouth every 6 (six) hours as needed.     . pantoprazole (PROTONIX) 40 MG tablet Take 1 tablet (40 mg total) by mouth daily. 90 tablet 3   No current facility-administered medications for this visit.    History   Social History Narrative   69 year old, white woman, married mother of 3 with only one child living. She has 3 grandchildren. Her son that had spina bifida died at age 70 05/26/10).   Never smoked. Does not drink alcohol.          ROS: A comprehensive Review of Systems - was performed Review of Systems  HENT: Negative for nosebleeds.   Eyes: Negative for blurred vision and double vision.  Respiratory: Positive for cough. Negative for wheezing.   Cardiovascular: Negative for claudication and leg swelling.  Gastrointestinal: Negative for blood in stool and melena.  Genitourinary: Negative for hematuria.  Musculoskeletal: Positive for back pain and joint pain.       Hand swelling involving the MCP and PIP joints. Also, says noted on the tips of fingers.  Neurological: Positive for dizziness (Occasional positional dizziness and vertigo). Negative for loss of consciousness, weakness and headaches.  Endo/Heme/Allergies: Does not bruise/bleed easily.  All other systems reviewed and are negative.  PHYSICAL EXAM BP 114/72 mmHg  Pulse 88  Ht 5\' 5"  (1.651 m)  Wt 131 lb 14.4 oz (59.829 kg)  BMI 21.95 kg/m2 General appearance: alert, cooperative, appears stated age, no distress  and Pleasant mood and affect. Has a flat, but not depressed affect. Well-nourished and well-groomed. HEENT: [place]/AT, EOMI, MMM, anicteric sclera Neck: no adenopathy, no carotid bruit, no JVD and supple, symmetrical, trachea midline Lungs: clear to auscultation bilaterally, normal percussion bilaterally and Nonlabored, good air movement. Heart: RRR, normal S1 and as 2. The S1 may be mildly physiologically split. I do not hear a click, and there is a slight trace at [facility]. No diastolic murmurs or rubs or gallops.. Nondisplaced PMI Abdomen: soft, non-tender; bowel sounds normal; no masses,  no organomegaly Extremities: extremities normal, atraumatic, no cyanosis or edema; right hand greater than left swelling in the MCP joints somewhat tender. There are calluses on the tips of her fingers right greater than left. The fingers have a somewhat sausagelike appearance there is evidence of Dupuytren's contracture Pulses: 2+ and symmetric Neurologic: Grossly normal  [id number] today: Yes Rate: 88, Rhythm: Normal sinus rhythm, low  voltage in limb leads, otherwise normal ECG;  No significant change  Recent Labs: None available.  ASSESSMENT / PLAN: No problem-specific assessment & plan notes found for this encounter.    No orders of the defined types were placed in this encounter.     Followup: In one year  Emmaleigh Longo, Piedad Climes, M.D., M.S. Interventional Cardiologist   Pager # [phone]

## 2014-06-28 NOTE — Assessment & Plan Note (Signed)
Well-controlled on current regimen. ?

## 2014-06-28 NOTE — Assessment & Plan Note (Signed)
Overall control. She still has intermittent palpitations but less frequent. On  Beta blocker with stable dose. She is on metoprolol twice a day.

## 2014-06-28 NOTE — Assessment & Plan Note (Addendum)
Mitral valve prolapse wasn't seen on her more recent echoes. There was definite thickening of the mitral leaflets but probably not true prolapse. Perhaps some bowing. Mild regurgitation only noted. No need for prophylactic treatment with antibiotics. Probably not related to her palpitations.

## 2014-06-28 NOTE — Assessment & Plan Note (Signed)
Are not 100% degrees, but that affected her hands get tight and swollen/painful when it is whole and that is counseled that if her fingers would suggest the possibility of [name] syndrome. Operative ports to keep her hands warm to the point of wearing gloves especially when she sleeps or swelling outside. It is extremely cold consider using hand warmers.

## 2014-07-29 DIAGNOSIS — M47816 Spondylosis without myelopathy or radiculopathy, lumbar region: Secondary | ICD-10-CM | POA: Diagnosis not present

## 2014-07-29 DIAGNOSIS — G894 Chronic pain syndrome: Secondary | ICD-10-CM | POA: Diagnosis not present

## 2014-07-29 DIAGNOSIS — M47812 Spondylosis without myelopathy or radiculopathy, cervical region: Secondary | ICD-10-CM | POA: Diagnosis not present

## 2014-07-29 DIAGNOSIS — M1712 Unilateral primary osteoarthritis, left knee: Secondary | ICD-10-CM | POA: Diagnosis not present

## 2014-08-12 ENCOUNTER — Other Ambulatory Visit: Payer: Self-pay | Admitting: Cardiology

## 2014-08-13 NOTE — Telephone Encounter (Signed)
Rx(s) sent to pharmacy electronically.  

## 2014-08-27 ENCOUNTER — Other Ambulatory Visit (INDEPENDENT_AMBULATORY_CARE_PROVIDER_SITE_OTHER): Payer: Medicare Other

## 2014-08-27 ENCOUNTER — Encounter: Payer: Self-pay | Admitting: Internal Medicine

## 2014-08-27 ENCOUNTER — Telehealth: Payer: Self-pay | Admitting: Family Medicine

## 2014-08-27 ENCOUNTER — Ambulatory Visit (INDEPENDENT_AMBULATORY_CARE_PROVIDER_SITE_OTHER): Payer: Medicare Other | Admitting: Internal Medicine

## 2014-08-27 VITALS — BP 140/90 | HR 86 | Temp 98.5°F | Resp 16 | Ht 65.0 in | Wt 132.5 lb

## 2014-08-27 DIAGNOSIS — R109 Unspecified abdominal pain: Secondary | ICD-10-CM

## 2014-08-27 DIAGNOSIS — R300 Dysuria: Secondary | ICD-10-CM

## 2014-08-27 DIAGNOSIS — R8281 Pyuria: Secondary | ICD-10-CM

## 2014-08-27 DIAGNOSIS — N390 Urinary tract infection, site not specified: Secondary | ICD-10-CM

## 2014-08-27 LAB — URINALYSIS, ROUTINE W REFLEX MICROSCOPIC
Bilirubin Urine: NEGATIVE
Hgb urine dipstick: NEGATIVE
Ketones, ur: NEGATIVE
Nitrite: NEGATIVE
TOTAL PROTEIN, URINE-UPE24: NEGATIVE
UROBILINOGEN UA: 0.2 (ref 0.0–1.0)
Urine Glucose: NEGATIVE
pH: 6.5 (ref 5.0–8.0)

## 2014-08-27 MED ORDER — NITROFURANTOIN MONOHYD MACRO 100 MG PO CAPS: 100.0000 mg | ORAL_CAPSULE | Freq: Two times a day (BID) | ORAL | Status: DC

## 2014-08-27 MED ORDER — PHENAZOPYRIDINE HCL 200 MG PO TABS: 200.0000 mg | ORAL_TABLET | Freq: Three times a day (TID) | ORAL | Status: DC | PRN

## 2014-08-27 NOTE — Progress Notes (Signed)
   Subjective:    Patient ID: Tiffany [name], female    DOB: Feb 24, 1946, 69 y.o.   MRN: [id number]  HPI Her symptoms began 08/26/14 as suprapubic pressure discomfort and low back pain. She also described associated hesitancy, dysuria, pyuria, and some flank discomfort. Heat was of benefit.  She states that she did have a urinary tract infection in 2013. She has no history of genitourinary anomalies or procedures.  She denies fever, chills, or sweats. She has no vaginal bleeding or discharge.   Review of Systems There is no associated urgency, frequency, or hematuria.    Objective:   Physical Exam Pertinent positive findings include: She has complete dentures.  Breath sounds are decreased in all lung fields.  An S4 gallop cadence is present.  Some suprapubic tenderness. Dorsalis pedis pulses are decreased.  She has crepitus in the knees, greater on the left than the right.   General appearance :adequately nourished; in no distress. Eyes: No conjunctival inflammation or scleral icterus is present. Oral exam:  Lips and gums are healthy appearing.There is no oropharyngeal erythema or exudate noted.  Heart:  Normal rate and regular rhythm. S1 and S2 normal without murmur, click, rub or other extra sounds   Lungs:Chest clear to auscultation; no wheezes, rhonchi,rales ,or rubs present.No increased work of breathing.  Abdomen: bowel sounds normal, soft without masses, organomegaly or hernias noted.  No guarding or rebound. No flank tenderness to percussion. Vascular : all pulses equal ; no bruits present. Skin:Warm & dry.  Intact without suspicious lesions or rashes ; no tenting or jaundice  Lymphatic: No lymphadenopathy is noted about the head, neck, axilla Neuro: Strength, tone & DTRs normal.       Assessment & Plan:  #1 dysuria, hesitancy, pyuria, low back pain. Probable urinary tract infection  Plan: See orders and recommendations

## 2014-08-27 NOTE — Patient Instructions (Signed)
Drink as much nondairy fluids as possible. Avoid spicy foods or alcohol as  these may aggravate the bladder. Do not take decongestants. Avoid narcotics if possible. 

## 2014-08-27 NOTE — Telephone Encounter (Signed)
Patient Name: Tiffany [name]  DOB: 1946/03/03    Initial Comment caller states she has UTI sx   Nurse Assessment  Nurse: Sherilyn CooterHenry, RN, Thurmond ButtsWade Date/Time Lamount Cohen(Eastern Time): 08/27/2014 12:05:28 PM  Confirm and document reason for call. If symptomatic, describe symptoms. ---Caller states that she has urinary pain beginning yesterday. She rates the pain as 6-8 on 0-10 scale. Her urine has been cloudy. Denies fever. She denies blood in her urine.  Has the patient traveled out of the country within the last 30 days? ---No  Does the patient require triage? ---Yes  Related visit to physician within the last 2 weeks? ---No  Does the PT have any chronic conditions? (i.e. diabetes, asthma, etc.) ---Yes  List chronic conditions. ---Mitral Valve Prolapse     Guidelines    Guideline Title Affirmed Question Affirmed Notes  Urination Pain - Female [1] SEVERE pain with urination (e.g., excruciating) AND [2] not improved after 2 hours of pain medicine and Sitz bath Sitz Bath did not help with the pain.   Final Disposition User   See Physician within 4 Hours (or PCP triage) Sherilyn CooterHenry, RN, Wade    Comments  Caller states that they usually let her come in and do a urine sample and she gets medication. I called the backline and spoke with Robin. Information given. Melanie came on the line. She needs to be seen. [facility] for Dr. Marga MelnickWilliam Hopper at 4:45pm.

## 2014-08-27 NOTE — Progress Notes (Signed)
Pre visit review using our clinic review tool, if applicable. No additional management support is needed unless otherwise documented below in the visit note. 

## 2014-08-28 ENCOUNTER — Telehealth: Payer: Self-pay | Admitting: Internal Medicine

## 2014-08-28 DIAGNOSIS — R8281 Pyuria: Secondary | ICD-10-CM

## 2014-08-28 DIAGNOSIS — R300 Dysuria: Secondary | ICD-10-CM

## 2014-08-28 LAB — URINE CULTURE

## 2014-08-28 MED ORDER — NITROFURANTOIN MONOHYD MACRO 100 MG PO CAPS: 100.0000 mg | ORAL_CAPSULE | Freq: Two times a day (BID) | ORAL | Status: DC

## 2014-08-28 NOTE — Telephone Encounter (Signed)
Pt called in and said that [facility] did not rec her nitrofurantoin, macrocrystal-monohydrate, (MACROBID) 100 MG capsule [[id number]][[id number]] .  Can it be resent

## 2014-08-28 NOTE — Telephone Encounter (Signed)
MD had phone in [facility] to [facility]...Raechel Chute/lmb

## 2014-09-01 ENCOUNTER — Telehealth: Payer: Self-pay

## 2014-09-01 NOTE — Telephone Encounter (Signed)
Pt left v/m; pt had seen Dr Alwyn RenHopper and pt request cb with urine culture results.Please advise.

## 2014-09-01 NOTE — Telephone Encounter (Signed)
Notify pt uriune culture is clear.. No UTI. Avoid bladder irritant and push water.  if she his not improving and has been having recurrent issue... She can consider a referral to a urologist. Dewitt RotaYuo can also read pt Dr. Dory PeruHoppers recs ( same as mine)

## 2014-09-03 NOTE — Telephone Encounter (Signed)
Mrs. Freyre notified as instructed by telephone. 

## 2014-09-03 NOTE — Telephone Encounter (Signed)
Pt returned your call. Please call back at [phone]

## 2014-09-03 NOTE — Telephone Encounter (Signed)
Left message for Ms. Rippeon to return my call. 

## 2014-10-21 DIAGNOSIS — M1712 Unilateral primary osteoarthritis, left knee: Secondary | ICD-10-CM | POA: Diagnosis not present

## 2014-10-21 DIAGNOSIS — G894 Chronic pain syndrome: Secondary | ICD-10-CM | POA: Diagnosis not present

## 2014-10-21 DIAGNOSIS — M47816 Spondylosis without myelopathy or radiculopathy, lumbar region: Secondary | ICD-10-CM | POA: Diagnosis not present

## 2014-10-21 DIAGNOSIS — F419 Anxiety disorder, unspecified: Secondary | ICD-10-CM | POA: Diagnosis not present

## 2014-10-29 ENCOUNTER — Other Ambulatory Visit: Payer: Self-pay | Admitting: Family Medicine

## 2014-10-29 NOTE — Telephone Encounter (Addendum)
Last office visit 08/27/2014 with Dr. Alwyn RenHopper.  Only Pamelor 50 mg is on medication list.  Last refilled 05/30/2014 for #90 with 1 refill.  Ok to refill?

## 2014-11-13 ENCOUNTER — Telehealth: Payer: Self-pay | Admitting: Family Medicine

## 2014-11-13 DIAGNOSIS — M79643 Pain in unspecified hand: Secondary | ICD-10-CM

## 2014-11-13 NOTE — Telephone Encounter (Signed)
Referral sent 

## 2014-11-13 NOTE — Telephone Encounter (Signed)
Pt is requesting a referral to hand specialist.  She is having pain and hand is swollen. Pt says that pain mgmt doesn't help her, and she doesn't know what to do next.  Pt is not on any pain medication for it.  Please call back at the home number. Thanks.

## 2014-11-13 NOTE — Telephone Encounter (Signed)
Mrs. Duffy RhodyStanley notified referral has been ordered by Dr. Ermalene SearingBedsole and Shirlee LimerickMarion or Revonda Standardllison will be calling her with that appointment once they get it scheduled for her.

## 2014-11-14 ENCOUNTER — Encounter: Payer: Self-pay | Admitting: Family Medicine

## 2014-11-18 DIAGNOSIS — G5602 Carpal tunnel syndrome, left upper limb: Secondary | ICD-10-CM | POA: Diagnosis not present

## 2014-11-18 DIAGNOSIS — G5601 Carpal tunnel syndrome, right upper limb: Secondary | ICD-10-CM | POA: Diagnosis not present

## 2014-11-18 DIAGNOSIS — M1811 Unilateral primary osteoarthritis of first carpometacarpal joint, right hand: Secondary | ICD-10-CM | POA: Diagnosis not present

## 2014-12-18 DIAGNOSIS — G5601 Carpal tunnel syndrome, right upper limb: Secondary | ICD-10-CM | POA: Diagnosis not present

## 2014-12-18 DIAGNOSIS — G5602 Carpal tunnel syndrome, left upper limb: Secondary | ICD-10-CM | POA: Diagnosis not present

## 2015-01-20 DIAGNOSIS — F419 Anxiety disorder, unspecified: Secondary | ICD-10-CM | POA: Diagnosis not present

## 2015-01-20 DIAGNOSIS — M1712 Unilateral primary osteoarthritis, left knee: Secondary | ICD-10-CM | POA: Diagnosis not present

## 2015-01-20 DIAGNOSIS — G894 Chronic pain syndrome: Secondary | ICD-10-CM | POA: Diagnosis not present

## 2015-01-20 DIAGNOSIS — M25531 Pain in right wrist: Secondary | ICD-10-CM | POA: Diagnosis not present

## 2015-04-22 ENCOUNTER — Other Ambulatory Visit: Payer: Self-pay | Admitting: Anesthesiology

## 2015-04-22 ENCOUNTER — Ambulatory Visit
Admission: RE | Admit: 2015-04-22 | Discharge: 2015-04-22 | Disposition: A | Discharge status: Home / Self Care | Payer: Medicare Other | Source: Ambulatory Visit | Attending: Anesthesiology | Admitting: Anesthesiology

## 2015-04-22 DIAGNOSIS — M25521 Pain in right elbow: Secondary | ICD-10-CM

## 2015-04-22 DIAGNOSIS — F419 Anxiety disorder, unspecified: Secondary | ICD-10-CM | POA: Diagnosis not present

## 2015-04-22 DIAGNOSIS — G894 Chronic pain syndrome: Secondary | ICD-10-CM | POA: Diagnosis not present

## 2015-04-22 DIAGNOSIS — M79601 Pain in right arm: Secondary | ICD-10-CM | POA: Diagnosis not present

## 2015-04-22 DIAGNOSIS — M1712 Unilateral primary osteoarthritis, left knee: Secondary | ICD-10-CM | POA: Diagnosis not present

## 2015-04-22 DIAGNOSIS — M25511 Pain in right shoulder: Secondary | ICD-10-CM | POA: Diagnosis not present

## 2015-05-05 ENCOUNTER — Telehealth: Payer: Self-pay | Admitting: Family Medicine

## 2015-05-05 ENCOUNTER — Other Ambulatory Visit (INDEPENDENT_AMBULATORY_CARE_PROVIDER_SITE_OTHER): Payer: Medicare Other

## 2015-05-05 DIAGNOSIS — M810 Age-related osteoporosis without current pathological fracture: Secondary | ICD-10-CM | POA: Diagnosis not present

## 2015-05-05 DIAGNOSIS — E785 Hyperlipidemia, unspecified: Secondary | ICD-10-CM

## 2015-05-05 LAB — LIPID PANEL
CHOL/HDL RATIO: 3
Cholesterol: 198 mg/dL (ref 0–200)
HDL: 72 mg/dL (ref >39.00)
LDL Cholesterol: 105 mg/dL — ABNORMAL HIGH (ref 0–99)
NONHDL: 125.82
Triglycerides: 106 mg/dL (ref 0.0–149.0)
VLDL: 21.2 mg/dL (ref 0.0–40.0)

## 2015-05-05 LAB — COMPREHENSIVE METABOLIC PANEL
ALK PHOS: 78 U/L (ref 39–117)
ALT: 9 U/L (ref 0–35)
AST: 16 U/L (ref 0–37)
Albumin: 4.3 g/dL (ref 3.5–5.2)
BILIRUBIN TOTAL: 0.4 mg/dL (ref 0.2–1.2)
BUN: 7 mg/dL (ref 6–23)
CO2: 30 meq/L (ref 19–32)
CREATININE: 0.84 mg/dL (ref 0.40–1.20)
Calcium: 10 mg/dL (ref 8.4–10.5)
Chloride: 101 mEq/L (ref 96–112)
GFR: 71.02 mL/min (ref >60.00)
GLUCOSE: 91 mg/dL (ref 70–99)
Potassium: 3.7 mEq/L (ref 3.5–5.1)
SODIUM: 139 meq/L (ref 135–145)
TOTAL PROTEIN: 7 g/dL (ref 6.0–8.3)

## 2015-05-05 LAB — VITAMIN D 25 HYDROXY (VIT D DEFICIENCY, FRACTURES): VITD: 34.97 ng/mL (ref 30.00–100.00)

## 2015-05-05 NOTE — Telephone Encounter (Signed)
-----   Message from Alvina Chouerri J Walsh sent at 04/28/2015  4:11 PM EST ----- Regarding: Lab orders for Tuesdday, 11.22.16 Patient is scheduled for CPX labs, please order future labs, Thanks , Camelia Engerri

## 2015-05-12 ENCOUNTER — Ambulatory Visit (INDEPENDENT_AMBULATORY_CARE_PROVIDER_SITE_OTHER): Payer: Medicare Other | Admitting: Family Medicine

## 2015-05-12 ENCOUNTER — Other Ambulatory Visit: Payer: Self-pay | Admitting: Family Medicine

## 2015-05-12 ENCOUNTER — Encounter: Payer: Self-pay | Admitting: Radiology

## 2015-05-12 ENCOUNTER — Telehealth: Payer: Self-pay

## 2015-05-12 ENCOUNTER — Encounter: Payer: Self-pay | Admitting: Family Medicine

## 2015-05-12 VITALS — BP 138/78 | HR 60 | Temp 98.7°F | Ht 63.5 in | Wt 130.8 lb

## 2015-05-12 DIAGNOSIS — Z23 Encounter for immunization: Secondary | ICD-10-CM

## 2015-05-12 DIAGNOSIS — Z7189 Other specified counseling: Secondary | ICD-10-CM

## 2015-05-12 DIAGNOSIS — Z79899 Other long term (current) drug therapy: Secondary | ICD-10-CM | POA: Diagnosis not present

## 2015-05-12 DIAGNOSIS — Z0000 Encounter for general adult medical examination without abnormal findings: Secondary | ICD-10-CM | POA: Diagnosis not present

## 2015-05-12 DIAGNOSIS — Z1159 Encounter for screening for other viral diseases: Secondary | ICD-10-CM | POA: Diagnosis not present

## 2015-05-12 DIAGNOSIS — E785 Hyperlipidemia, unspecified: Secondary | ICD-10-CM

## 2015-05-12 DIAGNOSIS — F331 Major depressive disorder, recurrent, moderate: Secondary | ICD-10-CM

## 2015-05-12 DIAGNOSIS — K219 Gastro-esophageal reflux disease without esophagitis: Secondary | ICD-10-CM

## 2015-05-12 DIAGNOSIS — G8929 Other chronic pain: Secondary | ICD-10-CM

## 2015-05-12 DIAGNOSIS — I10 Essential (primary) hypertension: Secondary | ICD-10-CM

## 2015-05-12 MED ORDER — NORTRIPTYLINE HCL 25 MG PO CAPS: ORAL_CAPSULE | ORAL | Status: DC

## 2015-05-12 MED ORDER — NORTRIPTYLINE HCL 50 MG PO CAPS: 50.0000 mg | ORAL_CAPSULE | Freq: Every day | ORAL | Status: DC

## 2015-05-12 NOTE — Assessment & Plan Note (Signed)
Stable control on nexium.

## 2015-05-12 NOTE — Addendum Note (Signed)
Addended by: Damita LackLORING, DONNA S on: 05/12/2015 03:01 PM   Modules accepted: Orders

## 2015-05-12 NOTE — Assessment & Plan Note (Signed)
Improved control with increase in exercise.

## 2015-05-12 NOTE — Progress Notes (Signed)
Pre visit review using our clinic review tool, if applicable. No additional management support is needed unless otherwise documented below in the visit note. 
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
## 2015-05-12 NOTE — Telephone Encounter (Signed)
New rx sent in with clarification.

## 2015-05-12 NOTE — Assessment & Plan Note (Signed)
Reviewed 2016 no living will or HCPOA. Info given.

## 2015-05-12 NOTE — Patient Instructions (Addendum)
Stop by lab on way out to pick up stool test, for UDS and controlled substance contract and  for hep C test.

## 2015-05-12 NOTE — Assessment & Plan Note (Addendum)
Well controlled on nortriptyline 75 mg daily. Refilled.

## 2015-05-12 NOTE — Assessment & Plan Note (Signed)
Well controlled. Continue current medication.  

## 2015-05-12 NOTE — Telephone Encounter (Signed)
Tiffany [name] with [facility] request cb for clarification of which to fill nortriptylline 25 mg or 50 mg; per office note pt taking 75 mg. Sp[facility] with [name] at [facility] and advised per office pt taking 75 mg daily. Tiffany [name] said the 25 mg instructions are take 1 -2 caps at hs. Will need new rx stating just 1 cap at hs to combine with 50 mg.

## 2015-05-13 ENCOUNTER — Encounter: Payer: Self-pay | Admitting: *Deleted

## 2015-05-13 LAB — HEPATITIS C ANTIBODY: HCV Ab: NEGATIVE

## 2015-05-14 ENCOUNTER — Other Ambulatory Visit: Payer: Self-pay | Admitting: Family Medicine

## 2015-05-14 NOTE — Telephone Encounter (Signed)
Marley with [facility] wanted clarification of nortriptylline. Advised per 05/12/15 phone note. Marley voiced understanding and nothing further needed.

## 2015-05-19 ENCOUNTER — Other Ambulatory Visit: Payer: Self-pay | Admitting: Family Medicine

## 2015-05-21 ENCOUNTER — Telehealth: Payer: Self-pay | Admitting: Family Medicine

## 2015-05-21 DIAGNOSIS — S63502A Unspecified sprain of left wrist, initial encounter: Secondary | ICD-10-CM | POA: Diagnosis not present

## 2015-05-21 NOTE — Telephone Encounter (Signed)
Pt fell yesterday and broke arm. She was [facility] by Dr. Farris HasKramer this morning. She is in a lot of pain and stated she had signed something before that only Dr. Ermalene SearingBedsole could prescribe her pain medications. She is wanting to know if you could send something in for her or she mentioned being more than happy to see you for an appointment.   CB# [phone]

## 2015-05-21 NOTE — Telephone Encounter (Signed)
[name] notified as instructed by telephone.  She states Dr. [name] had given her a Rx for pain medicine but due to her pain contract with Dr. [name] she gave him the prescription back. She will call their office back to let them know she has spoken with our office and it is okay for Dr. [name] to prescribe her pain medicine for a short time.

## 2015-05-21 NOTE — Telephone Encounter (Signed)
Tell her thank you for notifying us.. It is okay in this situation for ortho to prescribe a pain med for a short time given she was seen there.

## 2015-05-22 ENCOUNTER — Encounter: Payer: Medicare Other | Admitting: Family Medicine

## 2015-05-25 NOTE — Telephone Encounter (Signed)
Patient came to the office stating that she wants to cancel her drug contract with Dr. Ermalene SearingBedsole. Patient stated that she called Dr. Blenda BridegroomKramer's office back 05/21/15 and advised them what Dr. Ermalene SearingBedsole had said about pain medication. Patient stated that Dr. Blenda BridegroomKramer's office told her that they could not take her word over the telephone. Patient stated that she ended up calling her pain management doctor and he gave her a script for oxycodone 5 mg since she was not having any luck getting pain medication for her broken bone. Patient stated that she wants to cancel her pain medication contract with Dr. Ermalene SearingBedsole and she will be getting all of her pain medication from her pain management doctor in the future.

## 2015-05-26 NOTE — Telephone Encounter (Signed)
Agreed -

## 2015-05-29 ENCOUNTER — Other Ambulatory Visit: Payer: Medicare Other

## 2015-05-29 DIAGNOSIS — S63502D Unspecified sprain of left wrist, subsequent encounter: Secondary | ICD-10-CM | POA: Diagnosis not present

## 2015-06-02 ENCOUNTER — Other Ambulatory Visit: Payer: Self-pay | Admitting: Family Medicine

## 2015-06-02 ENCOUNTER — Encounter: Payer: Self-pay | Admitting: Family Medicine

## 2015-06-02 ENCOUNTER — Other Ambulatory Visit (INDEPENDENT_AMBULATORY_CARE_PROVIDER_SITE_OTHER): Payer: Medicare Other

## 2015-06-02 DIAGNOSIS — Z1211 Encounter for screening for malignant neoplasm of colon: Secondary | ICD-10-CM

## 2015-06-03 LAB — FECAL OCCULT BLOOD, IMMUNOCHEMICAL: Fecal Occult Bld: NEGATIVE

## 2015-06-04 ENCOUNTER — Encounter: Payer: Self-pay | Admitting: *Deleted

## 2015-06-19 DIAGNOSIS — S63502D Unspecified sprain of left wrist, subsequent encounter: Secondary | ICD-10-CM | POA: Diagnosis not present

## 2015-06-30 ENCOUNTER — Ambulatory Visit (INDEPENDENT_AMBULATORY_CARE_PROVIDER_SITE_OTHER): Payer: Medicare Other | Admitting: Cardiology

## 2015-06-30 ENCOUNTER — Encounter: Payer: Self-pay | Admitting: Cardiology

## 2015-06-30 VITALS — BP 106/70 | HR 95 | Ht 63.5 in | Wt 128.4 lb

## 2015-06-30 DIAGNOSIS — E785 Hyperlipidemia, unspecified: Secondary | ICD-10-CM

## 2015-06-30 DIAGNOSIS — I493 Ventricular premature depolarization: Secondary | ICD-10-CM

## 2015-06-30 DIAGNOSIS — I059 Rheumatic mitral valve disease, unspecified: Secondary | ICD-10-CM | POA: Diagnosis not present

## 2015-06-30 DIAGNOSIS — R002 Palpitations: Secondary | ICD-10-CM

## 2015-06-30 DIAGNOSIS — I10 Essential (primary) hypertension: Secondary | ICD-10-CM

## 2015-06-30 MED ORDER — METOPROLOL TARTRATE 50 MG PO TABS: ORAL_TABLET | ORAL | Status: DC

## 2015-06-30 NOTE — Patient Instructions (Addendum)
NO CHANGE IN CURRENT MEDICATIONS  Your physician wants you to follow-up in 12 MONTHS WITH DR HARDING.  You will receive a reminder letter in the mail two months in advance. If you don't receive a letter, please call our office to schedule the follow-up appointment.  If you need a refill on your cardiac medications before your next appointment, please call your pharmacy.   

## 2015-06-30 NOTE — Progress Notes (Signed)
PCP: Kerby Nora, MD  Clinic Note: Chief Complaint  Patient presents with  . Annual Exam    patient reports she can still feel her heart skipping. denies chest pain or SOB  . Palpitations    HPI: Tiffany [name] is a 70 y.o. female with a PMH below who presents today for annual f/u of palpitations.   She is a former patient of Dr. Susa Griffins, whom he was following for her palpitations and a reported history of mitral valve prolapse from the cardiac catheterization in 2002.[name] She has this diagnosis however her last several echocardiograms did not suggest the presence of mitral prolapse and only had mild MR.[name]  Tiffany [name] was last seen on 06/26/2014. She was doing well without any significant palpitations. She does have irregular heartbeats, but nothing rapid and lasting.  Recent Hospitalizations: None  Studies Reviewed: None  Interval History: Tiffany [name] presents today feeling quite well. She is a little bit upset because she broke her arm dancing about 6 weeks ago so she's been limited as far as activity goes. She still notices that her heart will skip a few beats here and there. But she doesn't really worry much about it. Nothing last more than a few seconds. She said this been going on for 50 years and isn't changed much now. No lightheadedness dizziness or syncope/near syncope.  Otherwise, she is as active as she was to be. No major issues.   Cardiac review of symptoms is relatively stable as noted below:  No chest pain or shortness of breath with rest or exertion.  No PND, orthopnea or edema.  No TIA/amaurosis fugax symptoms. No claudication.  ROS: A comprehensive was performed. Review of Systems  Constitutional: Negative for malaise/fatigue.  HENT: Negative for hearing loss and nosebleeds.   Eyes: Negative for blurred vision.  Respiratory: Negative for cough, shortness of breath and wheezing.   Cardiovascular: Negative for claudication.  Gastrointestinal:  Negative for constipation, blood in stool and melena.  Genitourinary: Negative for hematuria and flank pain.  Musculoskeletal: Positive for joint pain and falls. Negative for myalgias.       Broke her arm following while dancing. She lost her footing  Neurological: Negative for dizziness (she does have occasional vertigo), loss of consciousness, weakness and headaches.  Endo/Heme/Allergies: Does not bruise/bleed easily.  Psychiatric/Behavioral: Positive for memory loss. Negative for depression. The patient is not nervous/anxious and does not have insomnia.   All other systems reviewed and are negative.   Past Medical History  Diagnosis Date  . GERD (gastroesophageal reflux disease)   . Hyperlipemia   . Osteoporosis 07/15/1999  . Anxiety and depression   . History of MRI of cervical spine 09/98    Dr. Elesa Hacker  . History of MRI of lumbar spine 10/1998    Dr. Elesa Hacker  . Arthritis     knee  . Dupuytren's contracture of hand   . Hypertension   . Heart murmur   . Esophageal spasm   . Plantar fasciitis, bilateral     Past Surgical History  Procedure Laterality Date  . Lumbar spine surgery  1981  . Tubal ligation  1975    benign tumor  . Tonsillectomy and adenoidectomy  as a child  . Abd ultrasound  03/02/1999    NML, no gallstones  . 2 d echo  08/1999~04/13/2006    Mild MVP, MILD MR, Mild aortic sclerosis  . Spirometry  06/2004    NML  . Cardiac catheterization  01/2001  Nonobstructive CAD  . Esophagogastroduodenoscopy      Sliding [facility]. [facility]. ~ 07/1995-11/2003 Neg  . Knee surgery    . Esophagogastroduodenoscopy N/A 08/10/2012    Procedure: ESOPHAGOGASTRODUODENOSCOPY (EGD);  Surgeon: Hart Carwin, MD;  Location: [facility];  Service: Endoscopy;  Laterality: N/A;  . Savory dilation N/A 08/10/2012    Procedure: SAVORY DILATION;  Surgeon: Hart Carwin, MD;  Location: [facility];  Service: Endoscopy;  Laterality: N/A;  . Transthoracic echocardiogram  02/11/2011    Normal LV Size &  function; ef ~60-65%; grade 1 diastolic dysfunction. MILD MAC no mitral stenosis and trace mitral regurgitation, no comment of prolapse  . Nm lexiscan myoview ltd  03/26/2012    LOW RISK; no ischemia or infarction. Gut attenuation.    Prior to Admission medications   Medication Sig Start Date End Date Taking? Authorizing Provider  ALPRAZolam Prudy Feeler) 0.5 MG tablet Take 0.5 mg by mouth 3 (three) times daily as needed for sleep or anxiety.   Yes Historical Provider, MD  EPINEPHrine (EPIPEN IJ) Inject as directed as needed.   Yes Historical Provider, MD  methocarbamol (ROBAXIN-750) 750 MG tablet Take 750 mg by mouth 3 (three) times daily.     Yes Historical Provider, MD  metoprolol (LOPRESSOR) 50 MG tablet TAKE ONE (1) TABLET BY MOUTH TWO (2) TIMES DAILY 08/13/14  Yes Marykay Lex, MD  nortriptyline (PAMELOR) 25 MG capsule TAKE ONE (1) CAPSULE BY MOUTH EACH NIGHT AT BEDTIME to combine with 50 mg capsule 05/12/15  Yes Amy E Bedsole, MD  nortriptyline (PAMELOR) 50 MG capsule Take 1 capsule (50 mg total) by mouth at bedtime. 05/12/15  Yes Amy E Bedsole, MD  pantoprazole (PROTONIX) 40 MG tablet TAKE ONE (1) TABLET BY MOUTH EVERY DAY 05/19/15  Yes Amy Michelle Nasuti, MD   Allergies  Allergen Reactions  . Latex Anaphylaxis    REACTION: HIVES  . Aspirin     REACTION: SWELLING  . Codeine     REACTION: SWELLING  . Fexofenadine     REACTION: VOMITING  . Nsaids     REACTION: SWELLING  . Tramadol     REACTION: ITCH  . Zolpidem Tartrate     REACTION: unknown    Social History   Social History  . Marital Status: Married    Spouse Name: N/A  . Number of Children: 2  . Years of Education: N/A   Occupational History  . Nurse's aid    Social History Main Topics  . Smoking status: Never Smoker   . Smokeless tobacco: Never Used  . Alcohol Use: No  . Drug Use: No  . Sexual Activity: Not Asked   Other Topics Concern  . None   Social History Narrative   70 year old, white woman, married mother  of 3 with only one child living. She has 3 grandchildren. Her son that had spina bifida died at age 58 10-20-09).   Never smoked. Does not drink alcohol.         Family History  Problem Relation Age of Onset  . Breast cancer Mother   . Throat cancer Father     Wt Readings from Last 3 Encounters:  06/30/15 128 lb 6.4 oz (58.242 kg)  05/12/15 130 lb 12 oz (59.308 kg)  08/27/14 132 lb 8 oz (60.102 kg)    PHYSICAL EXAM BP 106/70 mmHg  Pulse 95  Ht 5' 3.5" (1.613 m)  Wt 128 lb 6.4 oz (58.242 kg)  BMI 22.39 kg/m2 General appearance: alert, cooperative,  appears stated age, no distress and Pleasant mood and affect. Has a flat, but not depressed affect. Well-nourished and well-groomed. HEENT: [place]/AT, EOMI, MMM, anicteric sclera Neck: no adenopathy, no carotid bruit, no JVD and supple, symmetrical, trachea midline Lungs: CTA B, normal percussion bilaterally and Nonlabored, good air movement. Heart: RRR - with intermittent ectopy, normal S1 and as 2. The S1 may be mildly physiologically split. I do not hear a click, and there is a slight trace at [facility]. No diastolic murmurs or rubs or gallops.. Nondisplaced PMI Abdomen: soft, non-tender; bowel sounds normal; no masses, no organomegaly Extremities: extremities normal, atraumatic, no cyanosis or edema;  Pulses: 2+ and symmetric Neurologic: Grossly normal    Adult ECG Report  Rate: 95 ;  Rhythm: normal sinus rhythm and premature ventricular contractions (PVC) - frequent PVCs, one triplet; incomplete RBBB with nonspecific ST and T-wave abnormality. QTC read as 500 ms, does not appear inaccurate.  Narrative Interpretation: With the exception of PVCs and the one triplet, the remainder the EKG is stable   Other studies Reviewed: Additional studies/ records that were reviewed today include:  Recent Labs:  Lipids are followed by PCP.     ASSESSMENT / PLAN: Problem List Items Addressed This Visit    Mitral valve disorder (Chronic)    Her  most recent echocardiograms did not mention mitral valve prolapse. I don't hear a click, and there is no MR. This is likely not related to palpitations.      Relevant Medications   metoprolol (LOPRESSOR) 50 MG tablet   Other Relevant Orders   EKG 12-Lead (Completed)   Hyperlipidemia - Primary (Chronic)    She has increased her exercise level, and lost some weight. Not currently on treatment. Levels are being monitored by PCP and reportedly have improved.      Relevant Medications   metoprolol (LOPRESSOR) 50 MG tablet   Other Relevant Orders   EKG 12-Lead (Completed)   Heart palpitations (Chronic)   Frequent unifocal PVCs (Chronic)    Looking back, this is the first EKG have seen the long time and actually has something that could explain her palpitations. She does have quite a bit of PVCs noted on this one EKG and did during exam. She is on twice a day Lopressor, and is really not overly symptomatic. She has had an ischemic evaluation and echocardiographic evaluations were relatively normal. Therefore likely benign. If the symptoms were to worsen, we would need to re-think an ischemic evaluation.      Relevant Medications   metoprolol (LOPRESSOR) 50 MG tablet   Essential hypertension, benign (Chronic)    Well-controlled on current regimen. Tolerating beta blocker well.      Relevant Medications   metoprolol (LOPRESSOR) 50 MG tablet      Current medicines are reviewed at length with the patient today. (+/- concerns) n/a The following changes have been made:  NO CHANGE IN CURRENT MEDICATIONS    Your physician wants you to follow-up in 12 MONTHS WITH DR Cobi Aldape. Studies Ordered:   Orders Placed This Encounter  Procedures  . EKG 12-Lead      Marykay Lex, M.D., M.S. Interventional Cardiologist   Pager # [phone]

## 2015-07-01 DIAGNOSIS — S63502D Unspecified sprain of left wrist, subsequent encounter: Secondary | ICD-10-CM | POA: Diagnosis not present

## 2015-07-02 ENCOUNTER — Encounter: Payer: Self-pay | Admitting: Cardiology

## 2015-07-02 DIAGNOSIS — R002 Palpitations: Secondary | ICD-10-CM | POA: Insufficient documentation

## 2015-07-02 DIAGNOSIS — I493 Ventricular premature depolarization: Secondary | ICD-10-CM | POA: Insufficient documentation

## 2015-07-02 NOTE — Assessment & Plan Note (Addendum)
Her most recent echocardiograms did not mention mitral valve prolapse. I don't hear a click, and there is no MR. This is likely not related to palpitations.

## 2015-07-02 NOTE — Assessment & Plan Note (Signed)
Well-controlled on current regimen. Tolerating beta blocker well.

## 2015-07-02 NOTE — Assessment & Plan Note (Signed)
Looking back, this is the first EKG have seen the long time and actually has something that could explain her palpitations. She does have quite a bit of PVCs noted on this one EKG and did during exam. She is on twice a day Lopressor, and is really not overly symptomatic. She has had an ischemic evaluation and echocardiographic evaluations were relatively normal. Therefore likely benign. If the symptoms were to worsen, we would need to re-think an ischemic evaluation.

## 2015-07-02 NOTE — Assessment & Plan Note (Signed)
She has increased her exercise level, and lost some weight. Not currently on treatment. Levels are being monitored by PCP and reportedly have improved.

## 2015-07-09 DIAGNOSIS — G894 Chronic pain syndrome: Secondary | ICD-10-CM | POA: Diagnosis not present

## 2015-07-09 DIAGNOSIS — M79601 Pain in right arm: Secondary | ICD-10-CM | POA: Diagnosis not present

## 2015-07-09 DIAGNOSIS — M1712 Unilateral primary osteoarthritis, left knee: Secondary | ICD-10-CM | POA: Diagnosis not present

## 2015-07-23 ENCOUNTER — Encounter: Payer: Self-pay | Admitting: Family Medicine

## 2015-07-23 ENCOUNTER — Ambulatory Visit (INDEPENDENT_AMBULATORY_CARE_PROVIDER_SITE_OTHER): Payer: Medicare Other | Admitting: Family Medicine

## 2015-07-23 VITALS — BP 110/79 | HR 91 | Temp 98.6°F | Ht 63.5 in | Wt 134.0 lb

## 2015-07-23 DIAGNOSIS — R05 Cough: Secondary | ICD-10-CM | POA: Diagnosis not present

## 2015-07-23 DIAGNOSIS — R059 Cough, unspecified: Secondary | ICD-10-CM | POA: Insufficient documentation

## 2015-07-23 MED ORDER — AMOXICILLIN 500 MG PO CAPS: 1000.0000 mg | ORAL_CAPSULE | Freq: Two times a day (BID) | ORAL | Status: DC

## 2015-07-23 NOTE — Patient Instructions (Signed)
Mucinex DM twice daily. Nasal saline spray 2-3 times a day. Start flonase 2 sprays per nostril daily. If not improving in next 3-4 days fill the antibiotics.

## 2015-07-23 NOTE — Progress Notes (Signed)
   Subjective:    Patient ID: Tiffany [name], female    DOB: 1946-04-24, 70 y.o.   MRN: [id number]  Cough This is a new problem. The current episode started in the past 7 days (5 days). The problem has been gradually worsening. The problem occurs constantly. The cough is productive of purulent sputum. Associated symptoms include headaches, nasal congestion, postnasal drip, a sore throat and wheezing. Pertinent negatives include no chills, ear pain, fever or shortness of breath. Associated symptoms comments: Sinus pressure. The symptoms are aggravated by lying down. Risk factors: nonsmoker. Treatments tried: tylenol and zyrtec. The treatment provided no relief. Her past medical history is significant for environmental allergies. There is no history of asthma, bronchitis, COPD, emphysema or pneumonia.   Sick contacts: none    Social History /Family History/Past Medical History reviewed and updated if needed.    Review of Systems  Constitutional: Negative for fever and chills.  HENT: Positive for postnasal drip and sore throat. Negative for ear pain.   Respiratory: Positive for cough and wheezing. Negative for shortness of breath.   Allergic/Immunologic: Positive for environmental allergies.  Neurological: Positive for headaches.       Objective:   Physical Exam  Constitutional: Vital signs are normal. She appears well-developed and well-nourished. She is cooperative.  Non-toxic appearance. She does not appear ill. No distress.  HENT:  Head: Normocephalic.  Right Ear: Hearing, tympanic membrane, external ear and ear canal normal. Tympanic membrane is not erythematous, not retracted and not bulging.  Left Ear: Hearing, tympanic membrane, external ear and ear canal normal. Tympanic membrane is not erythematous, not retracted and not bulging.  Nose: Mucosal edema and rhinorrhea present. Right sinus exhibits frontal sinus tenderness. Right sinus exhibits no maxillary sinus tenderness. Left  sinus exhibits frontal sinus tenderness. Left sinus exhibits no maxillary sinus tenderness.  Mouth/Throat: Uvula is midline, oropharynx is clear and moist and mucous membranes are normal.  Eyes: Conjunctivae, EOM and lids are normal. Pupils are equal, round, and reactive to light. Lids are everted and swept, no foreign bodies found.  Neck: Trachea normal and normal range of motion. Neck supple. Carotid bruit is not present. No thyroid mass and no thyromegaly present.  Cardiovascular: Normal rate, regular rhythm, S1 normal, S2 normal, normal heart sounds, intact distal pulses and normal pulses.  Exam reveals no gallop and no friction rub.   No murmur heard. Pulmonary/Chest: Effort normal and breath sounds normal. No tachypnea. No respiratory distress. She has no decreased breath sounds. She has no wheezes. She has no rhonchi. She has no rales.  Neurological: She is alert.  Skin: Skin is warm, dry and intact. No rash noted.  Psychiatric: Her speech is normal and behavior is normal. Judgment normal. Her mood appears not anxious. Cognition and memory are normal. She does not exhibit a depressed mood.          Assessment & Plan:

## 2015-07-23 NOTE — Progress Notes (Signed)
Pre visit review using our clinic review tool, if applicable. No additional management support is needed unless otherwise documented below in the visit note. 

## 2015-07-23 NOTE — Assessment & Plan Note (Signed)
No clear sign of bacterial infection.. Most likely viral URI.  Treat with mucolytic, nasal saline and nasal steroid. If not improving fill antibiotics.

## 2015-10-01 DIAGNOSIS — Z79891 Long term (current) use of opiate analgesic: Secondary | ICD-10-CM | POA: Diagnosis not present

## 2015-10-01 DIAGNOSIS — M1712 Unilateral primary osteoarthritis, left knee: Secondary | ICD-10-CM | POA: Diagnosis not present

## 2015-10-01 DIAGNOSIS — Z79899 Other long term (current) drug therapy: Secondary | ICD-10-CM | POA: Diagnosis not present

## 2015-10-01 DIAGNOSIS — G894 Chronic pain syndrome: Secondary | ICD-10-CM | POA: Diagnosis not present

## 2015-10-26 ENCOUNTER — Other Ambulatory Visit: Payer: Self-pay | Admitting: Family Medicine

## 2015-10-26 NOTE — Telephone Encounter (Signed)
Last office visit 07/23/2015.  Last refilled 05/12/2015 for #90 with 1 refill.  Ok to refill?

## 2015-12-24 DIAGNOSIS — M1712 Unilateral primary osteoarthritis, left knee: Secondary | ICD-10-CM | POA: Diagnosis not present

## 2015-12-24 DIAGNOSIS — G894 Chronic pain syndrome: Secondary | ICD-10-CM | POA: Diagnosis not present

## 2015-12-24 DIAGNOSIS — M79672 Pain in left foot: Secondary | ICD-10-CM | POA: Diagnosis not present

## 2015-12-24 DIAGNOSIS — Z79899 Other long term (current) drug therapy: Secondary | ICD-10-CM | POA: Diagnosis not present

## 2015-12-30 DIAGNOSIS — M7732 Calcaneal spur, left foot: Secondary | ICD-10-CM | POA: Diagnosis not present

## 2015-12-30 DIAGNOSIS — M79672 Pain in left foot: Secondary | ICD-10-CM | POA: Diagnosis not present

## 2015-12-30 DIAGNOSIS — M722 Plantar fascial fibromatosis: Secondary | ICD-10-CM | POA: Diagnosis not present

## 2015-12-30 DIAGNOSIS — M71572 Other bursitis, not elsewhere classified, left ankle and foot: Secondary | ICD-10-CM | POA: Diagnosis not present

## 2016-01-14 DIAGNOSIS — M722 Plantar fascial fibromatosis: Secondary | ICD-10-CM | POA: Diagnosis not present

## 2016-01-14 DIAGNOSIS — M71572 Other bursitis, not elsewhere classified, left ankle and foot: Secondary | ICD-10-CM | POA: Diagnosis not present

## 2016-01-21 ENCOUNTER — Ambulatory Visit (INDEPENDENT_AMBULATORY_CARE_PROVIDER_SITE_OTHER): Payer: Medicare Other | Admitting: Family Medicine

## 2016-01-21 ENCOUNTER — Encounter: Payer: Self-pay | Admitting: Family Medicine

## 2016-01-21 DIAGNOSIS — R413 Other amnesia: Secondary | ICD-10-CM

## 2016-01-21 DIAGNOSIS — R251 Tremor, unspecified: Secondary | ICD-10-CM

## 2016-01-21 LAB — COMPREHENSIVE METABOLIC PANEL
ALBUMIN: 4.3 g/dL (ref 3.5–5.2)
ALT: 9 U/L (ref 0–35)
AST: 15 U/L (ref 0–37)
Alkaline Phosphatase: 80 U/L (ref 39–117)
BILIRUBIN TOTAL: 0.6 mg/dL (ref 0.2–1.2)
BUN: 8 mg/dL (ref 6–23)
CALCIUM: 9.8 mg/dL (ref 8.4–10.5)
CO2: 30 mEq/L (ref 19–32)
Chloride: 100 mEq/L (ref 96–112)
Creatinine, Ser: 0.93 mg/dL (ref 0.40–1.20)
GFR: 63.02 mL/min (ref >60.00)
Glucose, Bld: 96 mg/dL (ref 70–99)
Potassium: 4.4 mEq/L (ref 3.5–5.1)
Sodium: 138 mEq/L (ref 135–145)
TOTAL PROTEIN: 7.1 g/dL (ref 6.0–8.3)

## 2016-01-21 LAB — TSH: TSH: 2.34 u[IU]/mL (ref 0.35–4.50)

## 2016-01-21 LAB — VITAMIN B12: Vitamin B-12: 198 pg/mL — ABNORMAL LOW (ref 211–911)

## 2016-01-21 LAB — T3, FREE: T3, Free: 2.9 pg/mL (ref 2.3–4.2)

## 2016-01-21 LAB — VITAMIN D 25 HYDROXY (VIT D DEFICIENCY, FRACTURES): VITD: 31.15 ng/mL (ref 30.00–100.00)

## 2016-01-21 LAB — T4, FREE: FREE T4: 0.81 ng/dL (ref 0.60–1.60)

## 2016-01-21 NOTE — Patient Instructions (Signed)
Stop at lab on way out. 

## 2016-01-21 NOTE — Addendum Note (Signed)
Addended by: Alvina ChouWALSH, Eluzer Howdeshell J on: 01/21/2016 04:42 PM   Modules accepted: Orders

## 2016-01-21 NOTE — Assessment & Plan Note (Addendum)
?   Possible SE to long term use of nortriptiline? ( Pt with great benefit from med, so not interested in stopping) ? Benign essential tremor: no family history  Will eval with labs .

## 2016-01-21 NOTE — Progress Notes (Signed)
Pre visit review using our clinic review tool, if applicable. No additional management support is needed unless otherwise documented below in the visit note. 

## 2016-01-21 NOTE — Progress Notes (Signed)
   Subjective:    Patient ID: Tiffany [name], female    DOB: [birth year], 70 y.o.   MRN: [id number]  HPI   70 year old female presents with new onset memory issues. Decreased short term memory  in last 2 weeks. Gradual changes.  She is taking sewing class. No forgetting names, no getting lost. No new numbness, no weakness, no vision change, no slurred speech.  No headache. No emesis, no falls, no head injury.   She feels like she has new tremor in bilateral hands at rest. She has noted this off and in 3-4 months.  She is not on any new medications. PHQ9: 3  Family history: no alzheimer's no parkinson's disease. No ben ess tremor.  Review of Systems  Constitutional: Negative for fatigue and fever.  HENT: Negative for ear pain.   Eyes: Negative for pain.  Respiratory: Negative for chest tightness and shortness of breath.   Cardiovascular: Negative for chest pain, palpitations and leg swelling.  Gastrointestinal: Negative for abdominal pain.  Genitourinary: Negative for dysuria.       Objective:   Physical Exam  Constitutional: She is oriented to person, place, and time. Vital signs are normal. She appears well-developed and well-nourished. She is cooperative.  Non-toxic appearance. She does not appear ill. No distress.  HENT:  Head: Normocephalic.  Right Ear: Hearing, tympanic membrane, external ear and ear canal normal. Tympanic membrane is not erythematous, not retracted and not bulging.  Left Ear: Hearing, tympanic membrane, external ear and ear canal normal. Tympanic membrane is not erythematous, not retracted and not bulging.  Nose: No mucosal edema or rhinorrhea. Right sinus exhibits no maxillary sinus tenderness and no frontal sinus tenderness. Left sinus exhibits no maxillary sinus tenderness and no frontal sinus tenderness.  Mouth/Throat: Uvula is midline, oropharynx is clear and moist and mucous membranes are normal.  Eyes: Conjunctivae, EOM and lids are normal.  Pupils are equal, round, and reactive to light. Lids are everted and swept, no foreign bodies found.  Neck: Trachea normal and normal range of motion. Neck supple. Carotid bruit is not present. No thyroid mass and no thyromegaly present.  Cardiovascular: Normal rate, regular rhythm, S1 normal, S2 normal, normal heart sounds, intact distal pulses and normal pulses.  Exam reveals no gallop and no friction rub.   No murmur heard. Pulmonary/Chest: Effort normal and breath sounds normal. No tachypnea. No respiratory distress. She has no decreased breath sounds. She has no wheezes. She has no rhonchi. She has no rales.  Abdominal: Soft. Normal appearance and bowel sounds are normal. There is no tenderness.  Neurological: She is alert and oriented to person, place, and time. She has normal strength and normal reflexes. No cranial nerve deficit or sensory deficit. She exhibits normal muscle tone. She displays a negative Romberg sign. Coordination and gait normal. GCS eye subscore is 4. GCS verbal subscore is 5. GCS motor subscore is 6.  Nml cerebellar exam   No papilledema  Slight  Resting tremor.. No intention tremor  No cogwheeling.  Skin: Skin is warm, dry and intact. No rash noted.  Psychiatric: She has a normal mood and affect. Her speech is normal and behavior is normal. Judgment and thought content normal. Her mood appears not anxious. Cognition and memory are normal. Cognition and memory are not impaired. She does not exhibit a depressed mood. She exhibits normal recent memory and normal remote memory.    MMSE: 29/30      Assessment & Plan:

## 2016-01-21 NOTE — Assessment & Plan Note (Addendum)
MMSE 29/30 Nml clock drawing.  Animal recall 11 in 15 sec.  Eval with labs.

## 2016-03-08 ENCOUNTER — Ambulatory Visit: Payer: Medicare Other

## 2016-03-11 ENCOUNTER — Ambulatory Visit (INDEPENDENT_AMBULATORY_CARE_PROVIDER_SITE_OTHER): Payer: Medicare Other

## 2016-03-11 DIAGNOSIS — Z23 Encounter for immunization: Secondary | ICD-10-CM | POA: Diagnosis not present

## 2016-04-12 ENCOUNTER — Other Ambulatory Visit: Payer: Self-pay | Admitting: Family Medicine

## 2016-04-12 NOTE — Telephone Encounter (Signed)
Last office visit 01/21/2016.  Last CPE 05/12/2015.  Last refilled 10/27/2015 for #90 with 1 refills.  Ok to refill?

## 2016-05-28 ENCOUNTER — Ambulatory Visit (INDEPENDENT_AMBULATORY_CARE_PROVIDER_SITE_OTHER): Payer: Medicare Other | Admitting: Family

## 2016-05-28 ENCOUNTER — Encounter: Payer: Self-pay | Admitting: Family

## 2016-05-28 VITALS — BP 130/90 | HR 118 | Temp 99.2°F | Resp 18 | Ht 63.5 in | Wt 126.0 lb

## 2016-05-28 DIAGNOSIS — R002 Palpitations: Secondary | ICD-10-CM

## 2016-05-28 MED ORDER — BENZONATATE 100 MG PO CAPS: 100.0000 mg | ORAL_CAPSULE | Freq: Three times a day (TID) | ORAL | 0 refills | Status: DC | PRN

## 2016-05-28 MED ORDER — AMOXICILLIN-POT CLAVULANATE 875-125 MG PO TABS: 1.0000 | ORAL_TABLET | Freq: Two times a day (BID) | ORAL | 0 refills | Status: DC

## 2016-05-28 NOTE — Patient Instructions (Addendum)
Please begin augmentin for your sinus infection. You may use tessalon as needed for cough and mucinex (over the counter for congestion).  Call if new/worsening symptoms, if you develop fever or if your symptoms are not improved in 3 days.

## 2016-05-28 NOTE — Progress Notes (Signed)
Subjective:    Patient ID: Tiffany [name], female    DOB: [birth year], 70 y.o.   MRN: [id number]  HPI   Tiffany [name] is a 70 yr old female who presents today with chief complaint of cough.  Pt reports that last Sunday AM she developed sinus congestion. She reports that she is not sleeping well due to the cough.  She reports + pain/pressure in the sinus.  She has tried some otc robitussin without improvement.  She reports that she feels "hot" but has not checked her temperature at home.     Review of Systems    see HPI  Past Medical History:  Diagnosis Date  . Anxiety and depression   . Arthritis    knee  . Dupuytren's contracture of hand   . Esophageal spasm   . GERD (gastroesophageal reflux disease)   . Heart murmur   . History of MRI of cervical spine 09/98   Dr. Elesa Hackereaton  . History of MRI of lumbar spine 10/1998   Dr. Elesa Hackereaton  . Hyperlipemia   . Hypertension   . Osteoporosis 07/15/1999  . Plantar fasciitis, bilateral      Social History   Social History  . Marital status: Married    Spouse name: N/A  . Number of children: 2  . Years of education: N/A   Occupational History  . Nurse's aid Retired   Social History Main Topics  . Smoking status: Never Smoker  . Smokeless tobacco: Never Used  . Alcohol use No  . Drug use: No  . Sexual activity: Not on file   Other Topics Concern  . Not on file   Social History Narrative   70 year old, white woman, married mother of 3 with only one child living. She has 3 grandchildren. Her son that had spina bifida died at age 72 (2011).   Never smoked. Does not drink alcohol.          Past Surgical History:  Procedure Laterality Date  . 2 D Echo  08/1999~04/13/2006   Mild MVP, MILD MR, Mild aortic sclerosis  . Abd ultrasound  03/02/1999   NML, no gallstones  . CARDIAC CATHETERIZATION  01/2001   Nonobstructive CAD  . ESOPHAGOGASTRODUODENOSCOPY     Sliding H. H. ~ 07/1995-11/2003 Neg  . ESOPHAGOGASTRODUODENOSCOPY  N/A 08/10/2012   Procedure: ESOPHAGOGASTRODUODENOSCOPY (EGD);  Surgeon: Hart Carwinora Tiffany Brodie, MD;  [facility];  Service: Endoscopy;  Laterality: N/A;  . KNEE SURGERY    . LUMBAR SPINE SURGERY  1981  . NM LEXISCAN MYOVIEW LTD  03/26/2012   LOW RISK; no ischemia or infarction. Gut attenuation.  [name]. SAVORY DILATION N/A 08/10/2012   Procedure: SAVORY DILATION;  Surgeon: Hart Carwinora Tiffany Brodie, MD;  Location: [facility];  Service: Endoscopy;  Laterality: N/A;  . SPIROMETRY  06/2004   NML  . TONSILLECTOMY AND ADENOIDECTOMY  as a child  . TRANSTHORACIC ECHOCARDIOGRAM  02/11/2011   Normal LV Size & function; ef ~60-65%; grade 1 diastolic dysfunction. MILD MAC no mitral stenosis and trace mitral regurgitation, no comment of prolapse  . TUBAL LIGATION  1975   benign tumor    Family History  Problem Relation Age of Onset  . Breast cancer Mother   . Throat cancer Father     Allergies  Allergen Reactions  . Latex Anaphylaxis    REACTION: HIVES  . Aspirin     REACTION: SWELLING  . Codeine     REACTION: SWELLING  . Fexofenadine  REACTION: VOMITING  . Nsaids     REACTION: SWELLING  . Tramadol     REACTION: ITCH  . Zolpidem Tartrate     REACTION: unknown    Current Outpatient Prescriptions on File Prior to Visit  Medication Sig Dispense Refill  . ALPRAZolam (XANAX) 0.5 MG tablet Take 0.5 mg by mouth 3 (three) times daily as needed for sleep or anxiety.    [name]. EPINEPHrine (EPIPEN IJ) Inject as directed as needed.    . methocarbamol (ROBAXIN-750) 750 MG tablet Take 750 mg by mouth 3 (three) times daily.      . metoprolol (LOPRESSOR) 50 MG tablet TAKE ONE (1) TABLET BY MOUTH TWO (2) TIMES DAILY 60 tablet 11  . nortriptyline (PAMELOR) 25 MG capsule TAKE ONE CAPSULE BY MOUTH EVERY NIGHT ATBEDTIME WITH THE 50MG  CAPSULE 90 capsule 1  . nortriptyline (PAMELOR) 50 MG capsule TAKE ONE CAPSULE BY MOUTH EVERY NIGHT AT BEDTIME 90 capsule 1  . pantoprazole (PROTONIX) 40 MG tablet TAKE ONE (1) TABLET BY  MOUTH EVERY DAY 90 tablet 3   No current facility-administered medications on file prior to visit.     BP 130/90 (BP Location: Left Arm, Patient Position: Sitting, Cuff Size: Normal)   Pulse (!) 118   Temp 99.2 F (37.3 C) (Oral)   Resp 18   Ht 5' 3.5" (1.613 Tiffany)   Wt 126 lb (57.2 kg)   BMI 21.97 kg/Tiffany    Objective:   Physical Exam  Constitutional: She is oriented to person, place, and time. She appears well-developed and well-nourished.  HENT:  Head: Normocephalic and atraumatic.  Right Ear: Tympanic membrane and ear canal normal.  Left Ear: Tympanic membrane and ear canal normal.  Nose: Right sinus exhibits maxillary sinus tenderness and frontal sinus tenderness. Left sinus exhibits maxillary sinus tenderness and frontal sinus tenderness.  Mouth/Throat: No oropharyngeal exudate, posterior oropharyngeal edema or posterior oropharyngeal erythema.  Cardiovascular: Normal rate, regular rhythm and normal heart sounds.   No murmur heard. Pulmonary/Chest: Effort normal and breath sounds normal. No respiratory distress. She has no wheezes.  Musculoskeletal: She exhibits no edema.  Neurological: She is alert and oriented to person, place, and time.  Psychiatric: She has a normal mood and affect. Her behavior is normal. Judgment and thought content normal.          Assessment & Plan:  Sinusitis- rx with augmentin and tessalon for her cough. I think her cough is being aggravated by her sinus drainage.  Advised pt to use nasal saline spray to help irrigate her sinuses.  Trial of mucinex.  Pt is advised to call if new/worsening symptoms, if you develop fever or if your symptoms are not improved in 3 days.   She did report some palpitations (not new for the patient).  EKG is personally reviewed and compared to previous EKG. Notes NSR with some PVC's. Reviewed EKG with Dr. Adriana Simasook as well who agrees.

## 2016-05-28 NOTE — Progress Notes (Signed)
Pre visit review using our clinic review tool, if applicable. No additional management support is needed unless otherwise documented below in the visit note. 

## 2016-05-30 ENCOUNTER — Telehealth: Payer: Self-pay | Admitting: *Deleted

## 2016-05-30 NOTE — Telephone Encounter (Signed)
I would recommend delsym prn instead.

## 2016-05-30 NOTE — Telephone Encounter (Signed)
Received prior authorization request for Benzonatate.  Tiffany [name] was seen by Melissa on (05/28/16) and was given Benzonatate.  The Tiffany [name] is a patient of Dr. Kerby NoraAmy Bedsole. Please advise.//AB/CMA

## 2016-05-31 NOTE — Telephone Encounter (Signed)
Spoke with pt re: below recommendation. Pt states pharmacy was able to sell her the Benzonatate at wholesale price but pt feels cough is not getting better and may be slightly worse. Advised pt we should bring her in for re-evaluation. Pt declines appointment at this time and states if Delsym doesn't help that she will call for appt. Advised her that if cough has worsened with benzonatate and augmentin she should be re-evaluated. Pt declines appt again.

## 2016-05-31 NOTE — Telephone Encounter (Signed)
Noted. Agree that pt would benefit from re-evaluation in the office however she has declined at this time.

## 2016-06-01 ENCOUNTER — Ambulatory Visit (INDEPENDENT_AMBULATORY_CARE_PROVIDER_SITE_OTHER): Payer: Medicare Other | Admitting: Family Medicine

## 2016-06-01 ENCOUNTER — Encounter: Payer: Self-pay | Admitting: Family Medicine

## 2016-06-01 DIAGNOSIS — J019 Acute sinusitis, unspecified: Secondary | ICD-10-CM

## 2016-06-01 DIAGNOSIS — J111 Influenza due to unidentified influenza virus with other respiratory manifestations: Secondary | ICD-10-CM | POA: Insufficient documentation

## 2016-06-01 DIAGNOSIS — B9689 Other specified bacterial agents as the cause of diseases classified elsewhere: Secondary | ICD-10-CM | POA: Insufficient documentation

## 2016-06-01 LAB — POC INFLUENZA A&B (BINAX/QUICKVUE)
INFLUENZA A, POC: POSITIVE — AB
INFLUENZA B, POC: NEGATIVE

## 2016-06-01 MED ORDER — OSELTAMIVIR PHOSPHATE 75 MG PO CAPS: 75.0000 mg | ORAL_CAPSULE | Freq: Two times a day (BID) | ORAL | 0 refills | Status: DC

## 2016-06-01 NOTE — Patient Instructions (Signed)

## 2016-06-01 NOTE — Assessment & Plan Note (Signed)
Finish course of Augmentin.

## 2016-06-01 NOTE — Progress Notes (Signed)
Subjective:   Patient ID: Tiffany RavelPatricia P [name], female    DOB: August 08, 1945, 70 y.o.   MRN: [id number]  Tiffany [name] is a pleasant 70 y.o. year old female who presents to clinic today with URI (Started 05-26-16 with nasal congestion and cough. Saw Dr Lendell CapriceSullivan [facility] and was given Augmentin and Tessalon Perrles. Has had a fever the last 2 days. 103 was the highest. Feels worse than she did on Saturday.)  on 06/01/2016  HPI:  Pt new to me- chart reviewed.  Presented to [facility] with several days of nasal congestion and cough.  No fever at that time. Given course of Augmentin and tessalon perles.  Developed a worsening cough and spiked a temp over the past day and a half, despite begin on Augmentin.  Tmax 103.  Having body aches as well.  Current Outpatient Prescriptions on File Prior to Visit  Medication Sig Dispense Refill  . ALPRAZolam (XANAX) 0.5 MG tablet Take 0.5 mg by mouth 3 (three) times daily as needed for sleep or anxiety.    [name]. amoxicillin-clavulanate (AUGMENTIN) 875-125 MG tablet Take 1 tablet by mouth 2 (two) times daily. 20 tablet 0  . benzonatate (TESSALON) 100 MG capsule Take 1 capsule (100 mg total) by mouth 3 (three) times daily as needed. 20 capsule 0  . EPINEPHrine (EPIPEN IJ) Inject as directed as needed.    . methocarbamol (ROBAXIN-750) 750 MG tablet Take 750 mg by mouth 3 (three) times daily.      . metoprolol (LOPRESSOR) 50 MG tablet TAKE ONE (1) TABLET BY MOUTH TWO (2) TIMES DAILY 60 tablet 11  . nortriptyline (PAMELOR) 25 MG capsule TAKE ONE CAPSULE BY MOUTH EVERY NIGHT ATBEDTIME WITH THE 50MG  CAPSULE 90 capsule 1  . nortriptyline (PAMELOR) 50 MG capsule TAKE ONE CAPSULE BY MOUTH EVERY NIGHT AT BEDTIME 90 capsule 1  . pantoprazole (PROTONIX) 40 MG tablet TAKE ONE (1) TABLET BY MOUTH EVERY DAY 90 tablet 3   No current facility-administered medications on file prior to visit.     Allergies  Allergen Reactions  . Latex Anaphylaxis   REACTION: HIVES  . Aspirin     REACTION: SWELLING  . Codeine     REACTION: SWELLING  . Fexofenadine     REACTION: VOMITING  . Nsaids     REACTION: SWELLING  . Tramadol     REACTION: ITCH  . Zolpidem Tartrate     REACTION: unknown    Past Medical History:  Diagnosis Date  . Anxiety and depression   . Arthritis    knee  . Dupuytren's contracture of hand   . Esophageal spasm   . GERD (gastroesophageal reflux disease)   . Heart murmur   . History of MRI of cervical spine 09/98   Dr. Elesa Hackereaton  . History of MRI of lumbar spine 10/1998   Dr. Elesa Hackereaton  . Hyperlipemia   . Hypertension   . Osteoporosis 07/15/1999  . Plantar fasciitis, bilateral     Past Surgical History:  Procedure Laterality Date  . 2 D Echo  08/1999~04/13/2006   Mild MVP, MILD MR, Mild aortic sclerosis  . Abd ultrasound  03/02/1999   NML, no gallstones  . CARDIAC CATHETERIZATION  01/2001   Nonobstructive CAD  . ESOPHAGOGASTRODUODENOSCOPY     Sliding H. H. ~ 07/1995-11/2003 Neg  . ESOPHAGOGASTRODUODENOSCOPY N/A 08/10/2012   Procedure: ESOPHAGOGASTRODUODENOSCOPY (EGD);  Surgeon: Hart Carwinora M Brodie, MD;  [facility];  Service: Endoscopy;  Laterality: N/A;  . KNEE  SURGERY    . LUMBAR SPINE SURGERY  1981  . NM LEXISCAN MYOVIEW LTD  03/26/2012   LOW RISK; no ischemia or infarction. Gut attenuation.  [name]. SAVORY DILATION N/A 08/10/2012   Procedure: SAVORY DILATION;  Surgeon: Hart Carwinora M Brodie, MD;  Location: [facility];  Service: Endoscopy;  Laterality: N/A;  . SPIROMETRY  06/2004   NML  . TONSILLECTOMY AND ADENOIDECTOMY  as a child  . TRANSTHORACIC ECHOCARDIOGRAM  02/11/2011   Normal LV Size & function; ef ~60-65%; grade 1 diastolic dysfunction. MILD MAC no mitral stenosis and trace mitral regurgitation, no comment of prolapse  . TUBAL LIGATION  1975   benign tumor    Family History  Problem Relation Age of Onset  . Breast cancer Mother   . Throat cancer Father     Social History   Social History  .  Marital status: Married    Spouse name: N/A  . Number of children: 2  . Years of education: N/A   Occupational History  . Nurse's aid Retired   Social History Main Topics  . Smoking status: Never Smoker  . Smokeless tobacco: Never Used  . Alcohol use No  . Drug use: No  . Sexual activity: Not on file   Other Topics Concern  . Not on file   Social History Narrative   70 year old, white woman, married mother of 3 with only one child living. She has 3 grandchildren. Her son that had spina bifida died at age 72 (2011).   Never smoked. Does not drink alcohol.         The PMH, PSH, Social History, Family History, Medications, and allergies have been reviewed [facility], and have been updated if relevant.   Review of Systems  Constitutional: Positive for fever.  HENT: Positive for congestion and sinus pain.   Eyes: Negative.   Respiratory: Positive for cough. Negative for shortness of breath.   Cardiovascular: Negative.   Gastrointestinal: Negative.   Endocrine: Negative.   Genitourinary: Negative.   Musculoskeletal: Positive for myalgias.  Allergic/Immunologic: Negative.   Neurological: Negative.   Hematological: Negative.   Psychiatric/Behavioral: Negative.   All other systems reviewed and are negative.      Objective:    BP 122/76   Pulse 66   Temp 99.2 F (37.3 C) (Oral)   Resp 12   Wt 124 lb (56.2 kg)   BMI 21.62 kg/m    Physical Exam   Appears moderately ill but not toxic; temperature as noted in vitals. Ears normal. Throat and pharynx normal.  Neck supple. No adenopathy in the neck. Sinuses non tender. The chest is clear.         Assessment & Plan:   Acute sinusitis, recurrence not specified, unspecified location No Follow-up on file.

## 2016-06-01 NOTE — Progress Notes (Signed)
Pre visit review using our clinic review tool, if applicable. No additional management support is needed unless otherwise documented below in the visit note. 

## 2016-06-01 NOTE — Assessment & Plan Note (Signed)
New- since fever developed within last 48 hours, will treat with Tamiflu.  Symptomatic therapy suggested: rest, increase fluids, gargle prn for sore throat, apply heat to sinuses prn and call prn if symptoms persist or worsen. Call or return to clinic prn if these symptoms worsen or fail to improve as anticipated.

## 2016-06-02 ENCOUNTER — Ambulatory Visit: Payer: Medicare Other | Admitting: Primary Care

## 2016-06-15 DIAGNOSIS — M1712 Unilateral primary osteoarthritis, left knee: Secondary | ICD-10-CM | POA: Diagnosis not present

## 2016-06-15 DIAGNOSIS — Z79899 Other long term (current) drug therapy: Secondary | ICD-10-CM | POA: Diagnosis not present

## 2016-06-15 DIAGNOSIS — G894 Chronic pain syndrome: Secondary | ICD-10-CM | POA: Diagnosis not present

## 2016-06-15 DIAGNOSIS — M7721 Periarthritis, right wrist: Secondary | ICD-10-CM | POA: Diagnosis not present

## 2016-06-30 ENCOUNTER — Ambulatory Visit: Payer: Medicare Other | Admitting: Cardiology

## 2016-07-13 ENCOUNTER — Ambulatory Visit (INDEPENDENT_AMBULATORY_CARE_PROVIDER_SITE_OTHER): Payer: Medicare Other | Admitting: Primary Care

## 2016-07-13 ENCOUNTER — Encounter (INDEPENDENT_AMBULATORY_CARE_PROVIDER_SITE_OTHER): Payer: Self-pay

## 2016-07-13 ENCOUNTER — Encounter: Payer: Self-pay | Admitting: Primary Care

## 2016-07-13 VITALS — BP 124/82 | HR 74 | Temp 98.0°F | Ht 63.5 in | Wt 127.1 lb

## 2016-07-13 DIAGNOSIS — R300 Dysuria: Secondary | ICD-10-CM

## 2016-07-13 LAB — POC URINALSYSI DIPSTICK (AUTOMATED)
Bilirubin, UA: NEGATIVE
Glucose, UA: NEGATIVE
KETONES UA: NEGATIVE
Nitrite, UA: NEGATIVE
PH UA: 6
PROTEIN UA: NEGATIVE
RBC UA: NEGATIVE
SPEC GRAV UA: 1.01
Urobilinogen, UA: NEGATIVE

## 2016-07-13 MED ORDER — CEPHALEXIN 500 MG PO CAPS: 500.0000 mg | ORAL_CAPSULE | Freq: Two times a day (BID) | ORAL | 0 refills | Status: DC

## 2016-07-13 NOTE — Patient Instructions (Signed)
Your symptoms and urine represent a urinary tract infection.  Start Cephalexin antibiotics. Take 1 capsule by mouth twice daily for 7 days.  Ensure you stay hydrated with water, continue cranberry juice.  You may try AZO for your symptoms of burning and pelvic pressure. This may be purchased over the counter.  Please notify us if no improvement in 3-4 days.  It was a pleasure meeting you!

## 2016-07-13 NOTE — Progress Notes (Signed)
Subjective:    Patient ID: Tiffany [name], female    DOB: [birth year], 71 y.o.   MRN: [id number]  HPI  Tiffany [name] is a 71 year old female with a history of UTI who presents today with a chief complaint of pelvic pressure. She also reports left flank pain, foul smelling urine, dysuria, frequency, nausea. Her symptoms have been present for the past 2-3 days. She denies fevers, hematuria. She's been drinking more water and cranberry juice. This feels like her prior UTI.  Review of Systems  Constitutional: Negative for fever.  Gastrointestinal: Positive for nausea. Negative for abdominal pain.  Genitourinary: Positive for dysuria, flank pain, frequency and pelvic pain. Negative for hematuria and vaginal discharge.  Musculoskeletal: Positive for back pain.       Past Medical History:  Diagnosis Date  . Anxiety and depression   . Arthritis    knee  . Dupuytren's contracture of hand   . Esophageal spasm   . GERD (gastroesophageal reflux disease)   . Heart murmur   . History of MRI of cervical spine 09/98   Dr. Rolin Barry  . History of MRI of lumbar spine 10/1998   Dr. Rolin Barry  . Hyperlipemia   . Hypertension   . Osteoporosis 07/15/1999  . Plantar fasciitis, bilateral      Social History   Social History  . Marital status: Married    Spouse name: N/A  . Number of children: 2  . Years of education: N/A   Occupational History  . Nurse's aid Retired   Social History Main Topics  . Smoking status: Never Smoker  . Smokeless tobacco: Never Used  . Alcohol use No  . Drug use: No  . Sexual activity: Not on file   Other Topics Concern  . Not on file   Social History Narrative   71 year old, white woman, married mother of 3 with only one child living. She has 3 grandchildren. Her son that had spina bifida died at age 73 19-Aug-2009).   Never smoked. Does not drink alcohol.          Past Surgical History:  Procedure Laterality Date  . 2 D Echo  08/1999~04/13/2006   Mild  MVP, MILD MR, Mild aortic sclerosis  . Abd ultrasound  03/02/1999   NML, no gallstones  . CARDIAC CATHETERIZATION  01/2001   Nonobstructive CAD  . ESOPHAGOGASTRODUODENOSCOPY     Sliding H. H. ~ 07/1995-11/2003 Neg  . ESOPHAGOGASTRODUODENOSCOPY N/A 08/10/2012   Procedure: ESOPHAGOGASTRODUODENOSCOPY (EGD);  Surgeon: Lafayette Dragon, MD;  Location: [facility];  Service: Endoscopy;  Laterality: N/A;  . KNEE SURGERY    . [place]  . NM LEXISCAN MYOVIEW LTD  03/26/2012   LOW RISK; no ischemia or infarction. Gut attenuation.  [name] SAVORY DILATION N/A 08/10/2012   Procedure: SAVORY DILATION;  Surgeon: Lafayette Dragon, MD;  Location: [facility];  Service: Endoscopy;  Laterality: N/A;  . SPIROMETRY  06/2004   NML  . TONSILLECTOMY AND ADENOIDECTOMY  as a child  . TRANSTHORACIC ECHOCARDIOGRAM  02/11/2011   Normal LV Size & function; ef ~42-59%; grade 1 diastolic dysfunction. MILD MAC no mitral stenosis and trace mitral regurgitation, no comment of prolapse  . TUBAL LIGATION  1975   benign tumor    Family History  Problem Relation Age of Onset  . Breast cancer Mother   . Throat cancer Father     Allergies  Allergen Reactions  . Latex Anaphylaxis    REACTION: HIVES  .  Aspirin     REACTION: SWELLING  . Codeine     REACTION: SWELLING  . Fexofenadine     REACTION: VOMITING  . Nsaids     REACTION: SWELLING  . Tramadol     REACTION: ITCH  . Zolpidem Tartrate     REACTION: unknown    Current Outpatient Prescriptions on File Prior to Visit  Medication Sig Dispense Refill  . ALPRAZolam (XANAX) 0.5 MG tablet Take 0.5 mg by mouth 3 (three) times daily as needed for sleep or anxiety.    [name] EPINEPHrine (EPIPEN IJ) Inject as directed as needed.    . methocarbamol (ROBAXIN-750) 750 MG tablet Take 750 mg by mouth 3 (three) times daily.      . metoprolol (LOPRESSOR) 50 MG tablet TAKE ONE (1) TABLET BY MOUTH TWO (2) TIMES DAILY 60 tablet 11  . nortriptyline (PAMELOR) 25 MG capsule  TAKE ONE CAPSULE BY MOUTH EVERY NIGHT ATBEDTIME WITH THE 50MG CAPSULE 90 capsule 1  . nortriptyline (PAMELOR) 50 MG capsule TAKE ONE CAPSULE BY MOUTH EVERY NIGHT AT BEDTIME 90 capsule 1  . pantoprazole (PROTONIX) 40 MG tablet TAKE ONE (1) TABLET BY MOUTH EVERY DAY 90 tablet 3   No current facility-administered medications on file prior to visit.     BP 124/82   Pulse 74   Temp 98 F (36.7 C) (Oral)   Ht 5' 3.5" (1.613 m)   Wt 127 lb 1.9 oz (57.7 kg)   BMI 22.17 kg/m    Objective:   Physical Exam  Constitutional: She is oriented to person, place, and time. She appears well-nourished.  Cardiovascular: Normal rate and regular rhythm.   Abdominal: Soft. Bowel sounds are normal. There is no tenderness. There is CVA tenderness.  Neurological: She is alert and oriented to person, place, and time.  Skin: Skin is warm and dry.          Assessment & Plan:  Dysuria:  Also with flank pain, pelvic pressure, nausea, frequency. History of UTI's, symptoms x 3 days. Exam today supportive of likely UTI. UA: 3+ leuks, negative nitrites, negative blood. Culture sent, Rx for Cephalexin course sent to pharmacy. Fluids, rest, AZO PRN. Follow up PRN.  Sheral Flow, NP

## 2016-07-13 NOTE — Progress Notes (Signed)
Pre visit review using our clinic review tool, if applicable. No additional management support is needed unless otherwise documented below in the visit note. 

## 2016-07-13 NOTE — Addendum Note (Signed)
Addended by: Tawnya CrookSAMBATH, Apolinar Bero on: 07/13/2016 11:57 AM   Modules accepted: Orders

## 2016-07-15 LAB — URINE CULTURE

## 2016-07-27 DIAGNOSIS — Z79899 Other long term (current) drug therapy: Secondary | ICD-10-CM | POA: Diagnosis not present

## 2016-07-27 DIAGNOSIS — M62838 Other muscle spasm: Secondary | ICD-10-CM | POA: Diagnosis not present

## 2016-07-27 DIAGNOSIS — G894 Chronic pain syndrome: Secondary | ICD-10-CM | POA: Diagnosis not present

## 2016-08-11 ENCOUNTER — Ambulatory Visit (INDEPENDENT_AMBULATORY_CARE_PROVIDER_SITE_OTHER): Payer: Medicare Other | Admitting: Cardiology

## 2016-08-11 VITALS — BP 120/76 | HR 63 | Ht 63.5 in | Wt 126.0 lb

## 2016-08-11 DIAGNOSIS — I10 Essential (primary) hypertension: Secondary | ICD-10-CM

## 2016-08-11 DIAGNOSIS — I493 Ventricular premature depolarization: Secondary | ICD-10-CM | POA: Diagnosis not present

## 2016-08-11 DIAGNOSIS — I059 Rheumatic mitral valve disease, unspecified: Secondary | ICD-10-CM | POA: Diagnosis not present

## 2016-08-11 DIAGNOSIS — E784 Other hyperlipidemia: Secondary | ICD-10-CM

## 2016-08-11 DIAGNOSIS — R002 Palpitations: Secondary | ICD-10-CM | POA: Diagnosis not present

## 2016-08-11 DIAGNOSIS — E7849 Other hyperlipidemia: Secondary | ICD-10-CM

## 2016-08-11 MED ORDER — METOPROLOL TARTRATE 50 MG PO TABS: ORAL_TABLET | ORAL | 3 refills | Status: DC

## 2016-08-11 NOTE — Patient Instructions (Addendum)
No changes with current medications   Your physician wants you to follow-up in June 2019 with Dr Herbie BaltimoreHARDING.You will receive a reminder letter in the mail two months in advance. If you don't receive a letter, please call our office to schedule the follow-up appointment.   If you need a refill on your cardiac medications before your next appointment, please call your pharmacy.

## 2016-08-11 NOTE — Progress Notes (Signed)
PCP: Tiffany Lofts, MD  Clinic Note: Chief Complaint  Patient [name] with  . Follow-up    pt denied chest pain, ot c/o SOB  . Hypertension  . Palpitations    History of. Well-controlled    HPI: Tiffany [name] is a 71 y.o. female with a PMH below who [name] today for Annual follow-up for palpitations..  She is a former patient of Dr. Terance [name], whom he was following for her palpitations and a reported history of mitral valve prolapse from the cardiac catheterization in 2002.[name] She has this diagnosis however her last several echocardiograms did not suggest the presence of mitral prolapse and only had mild MR.[name]   Tiffany [name] was last seen in January 2017. She was doing quite well with exception of having fallen and broken her arm. She noted occasional palpitations but nothing significant.  Recent Hospitalizations: No hospitalizations, but several bouts of recurrent illness  Studies Reviewed: No new studies  Interval History: Tiffany [name] today stable overall from a cardiac standpoint, however general healthwise she has not been doing so well of late. She has been sick since the summer of last year with first a flu and then 2 episodes of sinusitis and now bronchitis. She is finally now starting to recover.  While she was initially sick with the flu, she did note some shortness of breath, but since then has been doing fine with just some mild shortness of breath with exertion during her recovery. This is also getting better. She has been really fatigued from recurrent illnesses.  Besides the dyspnea associated with her flu and recurrent illnesses, she has been relatively stable from a cardiac standpoint. Review of Symptoms as follows: No chest pain or pressure with rest or exertion. No resting dyspnea No PND, orthopnea or edema.  No sustained palpitations, lightheadedness, dizziness, weakness or syncope/near syncope. No TIA/amaurosis fugax symptoms. No  claudication.  ROS: A comprehensive was performed. Review of Systems  Constitutional: Positive for malaise/fatigue (still trying to get over illness).  HENT: Positive for congestion.   Respiratory: Positive for cough. Negative for sputum production.   Musculoskeletal: Positive for neck pain.  Neurological: Positive for headaches. Negative for dizziness.  All other systems reviewed and are negative.   Past Medical History:  Diagnosis Date  . Anxiety and depression   . Arthritis    knee  . Dupuytren's contracture of hand   . Esophageal spasm   . GERD (gastroesophageal reflux disease)   . Heart murmur   . History of MRI of cervical spine 09/98   Dr. Rolin [name]  . History of MRI of lumbar spine 10/1998   Dr. Rolin [name]  . Hyperlipemia   . Hypertension   . Osteoporosis 07/15/1999  . Plantar fasciitis, bilateral     Past Surgical History:  Procedure Laterality Date  . 2 D Echo  08/1999~04/13/2006   Mild MVP, MILD MR, Mild aortic sclerosis  . Abd ultrasound  03/02/1999   NML, no gallstones  . CARDIAC CATHETERIZATION  01/2001   Nonobstructive CAD  . ESOPHAGOGASTRODUODENOSCOPY     [facility] ~ 07/1995-11/2003 Neg  . ESOPHAGOGASTRODUODENOSCOPY N/A 08/10/2012   Procedure: ESOPHAGOGASTRODUODENOSCOPY (EGD);  Surgeon: Tiffany Dragon, MD;  Location: [facility];  Service: Endoscopy;  Laterality: N/A;  . KNEE SURGERY    . [place]  . NM LEXISCAN MYOVIEW LTD  03/26/2012   LOW RISK; no ischemia or infarction. Gut attenuation.  [name] SAVORY DILATION N/A 08/10/2012   Procedure: SAVORY  DILATION;  Surgeon: Tiffany Dragon, MD;  Location: [facility];  Service: Endoscopy;  Laterality: N/A;  . SPIROMETRY  06/2004   NML  . TONSILLECTOMY AND ADENOIDECTOMY  as a child  . TRANSTHORACIC ECHOCARDIOGRAM  02/11/2011   Normal LV Size & function; ef ~24-23%; grade 1 diastolic dysfunction. MILD MAC no mitral stenosis and trace mitral regurgitation, no comment of prolapse  . TUBAL LIGATION   1975   benign tumor    Current Meds  Medication Sig  . ALPRAZolam (XANAX) 0.5 MG tablet Take 0.5 mg by mouth 3 (three) times daily as needed for sleep or anxiety.  [name] EPINEPHrine (EPIPEN IJ) Inject as directed as needed.  . methocarbamol (ROBAXIN-750) 750 MG tablet Take 750 mg by mouth 3 (three) times daily.    . metoprolol (LOPRESSOR) 50 MG tablet TAKE ONE (1) TABLET BY MOUTH TWO (2) TIMES DAILY  . nortriptyline (PAMELOR) 25 MG capsule TAKE ONE CAPSULE BY MOUTH EVERY NIGHT ATBEDTIME WITH THE 50MG CAPSULE  . nortriptyline (PAMELOR) 50 MG capsule TAKE ONE CAPSULE BY MOUTH EVERY NIGHT AT BEDTIME  . pantoprazole (PROTONIX) 40 MG tablet TAKE ONE (1) TABLET BY MOUTH EVERY DAY  . [DISCONTINUED] metoprolol (LOPRESSOR) 50 MG tablet TAKE ONE (1) TABLET BY MOUTH TWO (2) TIMES DAILY    Allergies  Allergen Reactions  . Latex Anaphylaxis    REACTION: HIVES  . Aspirin     REACTION: SWELLING  . Codeine     REACTION: SWELLING  . Fexofenadine     REACTION: VOMITING  . Nsaids     REACTION: SWELLING  . Tramadol     REACTION: ITCH  . Zolpidem Tartrate     REACTION: unknown    Social History   Social History  . Marital status: Married    Spouse name: N/A  . Number of children: 2  . Years of education: N/A   Occupational History  . Nurse's aid Retired   Social History Main Topics  . Smoking status: Never Smoker  . Smokeless tobacco: Never Used  . Alcohol use No  . Drug use: No  . Sexual activity: Not Asked   Other Topics Concern  . None   Social History Narrative   71 year old, white woman, married mother of 3 with only one child living. She has 3 grandchildren. Her son that had spina bifida died at age 41 2009/08/01).   Never smoked. Does not drink alcohol.    family history includes Breast cancer in her mother; Throat cancer in her father.  Wt Readings from Last 3 Encounters:  08/11/16 57.2 kg (126 lb)  07/13/16 57.7 kg (127 lb 1.9 oz)  06/01/16 56.2 kg (124 lb)    PHYSICAL  EXAM BP 120/76   Pulse 63   Ht 5' 3.5" (1.613 m)   Wt 57.2 kg (126 lb)   BMI 21.97 kg/m  General appearance: alert, cooperative, appears stated age, no distress and Pleasant mood and affect. Has a flat, but not depressed affect. Well-nourished and well-groomed. HEENT: [place]/AT, EOMI, MMM, anicteric sclera Neck: no adenopathy, no carotid bruit, no JVD and supple, symmetrical, trachea midline Lungs: CTA B, normal percussion bilaterally and Nonlabored, good air movement. Heart: RRR - with intermittent ectopy, normal S1 and as 2. The S1 may be mildly physiologically split. I do not hear a click, and there is a slight trace at [facility] @ LUSB. No diastolic murmurs or rubs or gallops.. Nondisplaced PMI Abdomen: soft, non-tender; bowel sounds normal; no masses, no organomegaly Extremities: extremities  normal, atraumatic, no cyanosis or edema;  Pulses: 2+ and symmetric Neurologic: Grossly normal    Adult ECG Report  Rate: 63 ;  Rhythm: normal sinus rhythm and Inc RBBB, CRO AMI with poor R wave progression.  OTW borderline voltage, but normal axis, intervals & duration; No PVCs  Narrative Interpretation: stable   Other studies Reviewed: Additional studies/ records that were reviewed today include:  Recent Labs:  Checked by PCP.   ASSESSMENT / PLAN: Problem List Items Addressed This Visit    Essential hypertension, benign (Chronic)    Well-controlled pressure on current medications. Only on beta blocker      Relevant Medications   metoprolol (LOPRESSOR) 50 MG tablet   Other Relevant Orders   EKG 12-Lead (Completed)   Frequent unifocal PVCs - Primary (Chronic)    Well-controlled overall. She has PVCs noted on EKG and on exam, but she is relatively asymptomatic. As long as she is not bothered by it, I don't think we need to further titrate her beta blocker, or pursue an ischemic evaluation.      Relevant Medications   metoprolol (LOPRESSOR) 50 MG tablet   Other Relevant Orders   EKG 12-Lead  (Completed)   Heart palpitations (Chronic)    Relatively well-controlled from a symptom standpoint although I do hear ectopy on exam.      Hyperlipidemia (Chronic)    Not currently on therapy. Monitored by PCP. Has remained active with diet and exercise.      Relevant Medications   metoprolol (LOPRESSOR) 50 MG tablet   Mitral valve disorder (Chronic)    Most recent echo did not suggest mitral valve prolapse, she may have had some redundant mitral valve tissue that was previously documented as mitral prolapse, but with the new definition did not meet the criteria. Probably not related to her palpitations and PVCs.      Relevant Medications   metoprolol (LOPRESSOR) 50 MG tablet      Current medicines are reviewed at length with the patient today. (+/- concerns) n/a The following changes have been made: n/a  Patient Instructions  No changes with current medications   Your physician wants you to follow-up in June 2019 with Dr Ellyn Hack.You will receive a reminder letter in the mail two months in advance. If you don't receive a letter, please call our office to schedule the follow-up appointment.   If you need a refill on your cardiac medications before your next appointment, please call your pharmacy.   Studies Ordered:   Orders Placed This Encounter  Procedures  . EKG 12-Lead      Glenetta Hew, M.D., M.S. Interventional Cardiologist   Pager # [phone] Phone # [phone] [address] [place], [place] [address]

## 2016-08-13 ENCOUNTER — Encounter: Payer: Self-pay | Admitting: Cardiology

## 2016-08-13 NOTE — Assessment & Plan Note (Signed)
Well-controlled pressure on current medications. Only on beta blocker

## 2016-08-13 NOTE — Assessment & Plan Note (Signed)
Most recent echo did not suggest mitral valve prolapse, she may have had some redundant mitral valve tissue that was previously documented as mitral prolapse, but with the new definition did not meet the criteria. Probably not related to her palpitations and PVCs.

## 2016-08-13 NOTE — Assessment & Plan Note (Signed)
Relatively well-controlled from a symptom standpoint although I do hear ectopy on exam.

## 2016-08-13 NOTE — Assessment & Plan Note (Signed)
Well-controlled overall. She has PVCs noted on EKG and on exam, but she is relatively asymptomatic. As long as she is not bothered by it, I don't think we need to further titrate her beta blocker, or pursue an ischemic evaluation.

## 2016-08-13 NOTE — Assessment & Plan Note (Signed)
Not currently on therapy. Monitored by PCP. Has remained active with diet and exercise.

## 2016-08-19 ENCOUNTER — Encounter: Payer: Self-pay | Admitting: Family Medicine

## 2016-08-19 ENCOUNTER — Ambulatory Visit (INDEPENDENT_AMBULATORY_CARE_PROVIDER_SITE_OTHER): Payer: Medicare Other | Admitting: Family Medicine

## 2016-08-19 VITALS — BP 132/78 | Temp 98.6°F | Resp 12 | Ht 63.5 in | Wt 124.2 lb

## 2016-08-19 DIAGNOSIS — K219 Gastro-esophageal reflux disease without esophagitis: Secondary | ICD-10-CM

## 2016-08-19 DIAGNOSIS — N390 Urinary tract infection, site not specified: Secondary | ICD-10-CM | POA: Diagnosis not present

## 2016-08-19 DIAGNOSIS — R112 Nausea with vomiting, unspecified: Secondary | ICD-10-CM | POA: Diagnosis not present

## 2016-08-19 DIAGNOSIS — R102 Pelvic and perineal pain: Secondary | ICD-10-CM | POA: Diagnosis not present

## 2016-08-19 DIAGNOSIS — M545 Low back pain: Secondary | ICD-10-CM

## 2016-08-19 DIAGNOSIS — R8290 Unspecified abnormal findings in urine: Secondary | ICD-10-CM | POA: Diagnosis not present

## 2016-08-19 LAB — POCT URINALYSIS DIPSTICK
BILIRUBIN UA: NEGATIVE
Glucose, UA: NEGATIVE
Ketones, UA: NEGATIVE
Nitrite, UA: POSITIVE
Spec Grav, UA: <=1.005
Urobilinogen, UA: 0.2
pH, UA: 6.5

## 2016-08-19 LAB — CBC WITH DIFFERENTIAL/PLATELET
Basophils Absolute: 0.1 10*3/uL (ref 0.0–0.1)
Basophils Relative: 0.8 % (ref 0.0–3.0)
EOS PCT: 2 % (ref 0.0–5.0)
Eosinophils Absolute: 0.2 10*3/uL (ref 0.0–0.7)
HEMATOCRIT: 37.5 % (ref 36.0–46.0)
Hemoglobin: 12.3 g/dL (ref 12.0–15.0)
Lymphocytes Relative: 26.8 % (ref 12.0–46.0)
Lymphs Abs: 2 10*3/uL (ref 0.7–4.0)
MCHC: 32.8 g/dL (ref 30.0–36.0)
MCV: 93.6 fl (ref 78.0–100.0)
MONOS PCT: 10.1 % (ref 3.0–12.0)
Monocytes Absolute: 0.8 10*3/uL (ref 0.1–1.0)
NEUTROS ABS: 4.5 10*3/uL (ref 1.4–7.7)
NEUTROS PCT: 60.3 % (ref 43.0–77.0)
Platelets: 367 10*3/uL (ref 150.0–400.0)
RBC: 4 Mil/uL (ref 3.87–5.11)
RDW: 14 % (ref 11.5–15.5)
WBC: 7.5 10*3/uL (ref 4.0–10.5)

## 2016-08-19 MED ORDER — RANITIDINE HCL 150 MG PO TABS: 150.0000 mg | ORAL_TABLET | Freq: Every day | ORAL | 0 refills | Status: DC

## 2016-08-19 MED ORDER — SULFAMETHOXAZOLE-TRIMETHOPRIM 800-160 MG PO TABS: 1.0000 | ORAL_TABLET | Freq: Two times a day (BID) | ORAL | 0 refills | Status: AC

## 2016-08-19 MED ORDER — ONDANSETRON HCL 4 MG PO TABS: 4.0000 mg | ORAL_TABLET | Freq: Three times a day (TID) | ORAL | 0 refills | Status: AC | PRN

## 2016-08-19 NOTE — Progress Notes (Signed)
HPI:   [name] is a 71 y.o. female, who is here today complaining of a week of urinary symptoms.  Started with suprapubic abdominal pain, moderate to severe pressure sensation, constant,no radiated. No alleviating or exacerbating factors identified.  Dysuria: Yes, sometimes. Urinary frequency: Yes Urinary urgency: Denies Incontinence: Denies Gross hematuria: Denies Odorous and cloudy urine, she states that her urine has "a lot of junk."   Nausea or vomiting: Started 2 days ago,no diarrhea.Today she has had 2 episodes and yesterday 3-4.Deneis hematemesis. Exacerbated by food intake and by bending over applying pressure to upper abdomen.She denies upper abdominal pain. Hx of GERD and hiatal hernia. + heartburn.  Abnormal vaginal bleeding or discharge: Denies  Back pain, she has Hx of chronic back pain.She feels like this pain is not her typical pain.No radiated,exacerbated  by movement, and alleviated by rest.Pain is "bad."  Sexual activity: Denies. Hx of UTI: Yes, last one she is reporting in 06/2015.   OTC medications for this problem: Cranberry pills   Review of Systems  Constitutional: Positive for appetite change and fatigue. Negative for chills and fever.  HENT: Negative for mouth sores, sore throat and trouble swallowing.   Respiratory: Negative for cough, shortness of breath and wheezing.   Cardiovascular: Negative for chest pain and leg swelling.  Gastrointestinal: Positive for abdominal pain, nausea and vomiting. Negative for abdominal distention and blood in stool.       No changes in bowel habits.  Genitourinary: Positive for dysuria and frequency. Negative for hematuria, vaginal bleeding and vaginal discharge.  Musculoskeletal: Positive for back pain. Negative for gait problem and neck pain.  Skin: Negative for rash.  Neurological: Negative for syncope, weakness and headaches.  Hematological: Negative for adenopathy. Does not bruise/bleed  easily.  Psychiatric/Behavioral: Negative for confusion. The patient is nervous/anxious.       Current Outpatient Prescriptions on File Prior to Visit  Medication Sig Dispense Refill  . ALPRAZolam (XANAX) 0.5 MG tablet Take 0.5 mg by mouth 3 (three) times daily as needed for sleep or anxiety.    [name] EPINEPHrine (EPIPEN IJ) Inject as directed as needed.    . methocarbamol (ROBAXIN-750) 750 MG tablet Take 750 mg by mouth 3 (three) times daily.      . metoprolol (LOPRESSOR) 50 MG tablet TAKE ONE (1) TABLET BY MOUTH TWO (2) TIMES DAILY 180 tablet 3  . nortriptyline (PAMELOR) 25 MG capsule TAKE ONE CAPSULE BY MOUTH EVERY NIGHT ATBEDTIME WITH THE 50MG  CAPSULE 90 capsule 1  . nortriptyline (PAMELOR) 50 MG capsule TAKE ONE CAPSULE BY MOUTH EVERY NIGHT AT BEDTIME 90 capsule 1  . pantoprazole (PROTONIX) 40 MG tablet TAKE ONE (1) TABLET BY MOUTH EVERY DAY 90 tablet 3   No current facility-administered medications on file prior to visit.      Past Medical History:  Diagnosis Date  . Anxiety and depression   . Arthritis    knee  . Dupuytren's contracture of hand   . Esophageal spasm   . GERD (gastroesophageal reflux disease)   . Heart murmur   . History of MRI of cervical spine 09/98   Dr. Elesa Hacker  . History of MRI of lumbar spine 10/1998   Dr. Elesa Hacker  . Hyperlipemia   . Hypertension   . Osteoporosis 07/15/1999  . Plantar fasciitis, bilateral    Allergies  Allergen Reactions  . Latex Anaphylaxis    REACTION: HIVES  . Aspirin     REACTION: SWELLING  . Codeine  REACTION: SWELLING  . Fexofenadine     REACTION: VOMITING  . Nsaids     REACTION: SWELLING  . Tramadol     REACTION: ITCH  . Zolpidem Tartrate     REACTION: unknown    Social History   Social History  . Marital status: Married    Spouse name: N/A  . Number of children: 2  . Years of education: N/A   Occupational History  . Nurse's aid Retired   Social History Main Topics  . Smoking status: Never Smoker  .  Smokeless tobacco: Never Used  . Alcohol use No  . Drug use: No  . Sexual activity: Not Asked   Other Topics Concern  . None   Social History Narrative   71 year old, white woman, married mother of 3 with only one child living. She has 3 grandchildren. Her son that had spina bifida died at age 73 (2011).   Never smoked. Does not drink alcohol.    Vitals:   08/19/16 1042  BP: 132/78  Resp: 12  Temp: 98.6 F (37 C)   Body mass index is 21.66 kg/m.   Physical Exam  Constitutional: She is oriented to person, place, and time. She appears well-developed. She does not appear ill. She appears distressed (Due to pain).  HENT:  Head: Atraumatic.  Mouth/Throat: Oropharynx is clear and moist and mucous membranes are normal. She has dentures.  Eyes: Conjunctivae and EOM are normal.  Cardiovascular: Normal rate.  An irregular rhythm present.  Murmur (? soft SEM LUSB) heard. Respiratory: Effort normal and breath sounds normal. No respiratory distress.  GI: Soft. She exhibits no mass. There is tenderness in the suprapubic area. There is CVA tenderness (Left elicits pain). There is no rigidity, no rebound and no guarding.  Musculoskeletal: She exhibits no edema.  Tenderness left paraspinal muscles L1-4  Lymphadenopathy:    She has no cervical adenopathy.  Neurological: She is alert and oriented to person, place, and time. Coordination and gait normal.  Skin: Skin is warm. No erythema.  Psychiatric: Her speech is normal. Her mood appears anxious.    ASSESSMENT AND PLAN:   Tiffany Hashimotoatricia was seen today for cloudy urine.  Diagnoses and all orders for this visit:  Abdominal pain, suprapubic  We discussed possible causes. 12/2012 abd/pelvic CT due to persistent lower abdominal pain,not better after treated for UTI. 1.  Heavily redundant sigmoid colon with very high colonic stool burden extending from the cecum to the rectum and suggestive of underlying constipation. 2.  The bladder is  distended with urine but otherwise unremarkable. 3.  No hydronephrosis or nephrolithiasis. 4.  Small hiatal hernia with mild circumferential thickening of the distal esophageal wall.    Instructed about warning signs. F/U with PCP in 1-2 weeks,before if needed.   -     CBC with Differential/Platelet  Cloudy urine  Urine dipstick abnormal.  -     POCT urinalysis dipstick -     Urine culture  Urinary tract infection without hematuria, site unspecified        07/13/16 Ucx grew E Coli > 100,000 CFU.She is not sure about treatment. She is reporting abx treatment recently for "sinuses."  Empiric abx treatment started today,based on last Ucx. We will be tailored according to Ucx results and susceptibility report.  Clearly instructed about warning signs. F/U if symptoms persist.  -     sulfamethoxazole-trimethoprim (BACTRIM DS,SEPTRA DS) 800-160 MG tablet; Take 1 tablet by mouth 2 (two) times daily.  Left low  back pain, unspecified chronicity, with sciatica presence unspecified  Hx of chronic back pain. Certainly problem can be aggravated by UTI.No fever or symptoms that suggest pyelonephritis,instrucetd about warning signs.  Nausea and vomiting in adult  ? GERD related. Zofran recommended. Small and frequent sips of clear fluids or bites of bland food. Instructed about warning signs. F/U with PCP in 1-2 weeks.  -     ondansetron (ZOFRAN) 4 MG tablet; Take 1 tablet (4 mg total) by mouth every 8 (eight) hours as needed for nausea or vomiting. -     CBC with Differential/Platelet  Gastroesophageal reflux disease, esophagitis presence not specified  Could be causing N/V, no changes in Protonix. EGD 2014. GERD precautions reviewed. Zantac 150 mg at bedtime. F/U with PCP in 1-2 weeks.  -     ranitidine (ZANTAC) 150 MG tablet; Take 1 tablet (150 mg total) by mouth at bedtime.      -[name] was advised to return or notify a doctor immediately if symptoms  worsen suddenly or new concerns arise. She voices understanding.      Heyli Min G. [place], MD  [facility]. [place] office.

## 2016-08-19 NOTE — Progress Notes (Signed)
Pre visit review using our clinic review tool, if applicable. No additional management support is needed unless otherwise documented below in the visit note. 

## 2016-08-19 NOTE — Patient Instructions (Signed)
  Ms.Sarahelizabeth P Attig I have seen you today for an acute visit.  A few things to remember from today's visit:   Cloudy urine - Plan: POCT urinalysis dipstick, Urine culture  Abdominal pain, suprapubic  Urinary tract infection without hematuria, site unspecified - Plan: sulfamethoxazole-trimethoprim (BACTRIM DS,SEPTRA DS) 800-160 MG tablet  Left low back pain, unspecified chronicity, with sciatica presence unspecified  Nausea and vomiting in adult - Plan: ondansetron (ZOFRAN) 4 MG tablet  Gastroesophageal reflux disease, esophagitis presence not specified - Plan: ranitidine (ZANTAC) 150 MG tablet      Adequate fluid intake, avoid holding urine for long hours, and over the counter Vit C OR cranberry capsules might help.  Today we will treat empirically with antibiotic, which we might need to change when urine culture comes back depending of bacteria susceptibility.  Seek immediate medical attention if severe abdominal pain, vomiting, fever/chills, or worsening symptoms. F/U if symptomatic are not any better after 2-3 days of antibiotic treatment.  Follow a bland diet for the next few days, small meals, adequate hydration.    GET HELP RIGHT AWAY IF:   The pain is does not go away within 2 hours.  Sudden severe/worsening pain.  You keep throwing up (vomiting).  The pain changes and is only in the right or left part of the belly.  Not being able to pass gas or poop.  You have bloody or tarry looking poop.   MAKE SURE YOU:   Understand these instructions.  Will watch your condition.  Will get help right away if you are not doing well or get worse.   If symptoms are persistent please arrange a follow up appointment.   Medications prescribed today are intended for short period of time and will not be refill upon request, a follow up appointment might be necessary to discuss continuation of of treatment if appropriate.  Daily Probiotic,Align 1 cap daily.   In  general please monitor for signs of worsening symptoms and seek immediate medical attention if any concerning.  In  10-14 days you should schedule a follow up appointment with your doctor, before if needed.    Please be sure you have an appointment already scheduled with your PCP before you leave today.

## 2016-08-20 ENCOUNTER — Other Ambulatory Visit: Payer: Self-pay | Admitting: Family Medicine

## 2016-08-22 LAB — URINE CULTURE

## 2016-08-26 ENCOUNTER — Telehealth: Payer: Self-pay | Admitting: Family Medicine

## 2016-08-26 NOTE — Telephone Encounter (Signed)
Left pt message asking to call Allison back directly at [phone] to schedule AWV.+ labs with Lesia and CPE with PCP. °

## 2016-09-07 DIAGNOSIS — M542 Cervicalgia: Secondary | ICD-10-CM | POA: Diagnosis not present

## 2016-09-07 DIAGNOSIS — M47812 Spondylosis without myelopathy or radiculopathy, cervical region: Secondary | ICD-10-CM | POA: Diagnosis not present

## 2016-09-08 ENCOUNTER — Ambulatory Visit (INDEPENDENT_AMBULATORY_CARE_PROVIDER_SITE_OTHER): Payer: Medicare Other

## 2016-09-08 ENCOUNTER — Telehealth: Payer: Self-pay | Admitting: Family Medicine

## 2016-09-08 VITALS — BP 124/80 | HR 71 | Temp 99.3°F | Ht 63.25 in | Wt 128.5 lb

## 2016-09-08 DIAGNOSIS — M810 Age-related osteoporosis without current pathological fracture: Secondary | ICD-10-CM

## 2016-09-08 DIAGNOSIS — E784 Other hyperlipidemia: Secondary | ICD-10-CM | POA: Diagnosis not present

## 2016-09-08 DIAGNOSIS — E7849 Other hyperlipidemia: Secondary | ICD-10-CM

## 2016-09-08 DIAGNOSIS — Z0000 Encounter for general adult medical examination without abnormal findings: Secondary | ICD-10-CM | POA: Diagnosis not present

## 2016-09-08 DIAGNOSIS — R413 Other amnesia: Secondary | ICD-10-CM

## 2016-09-08 DIAGNOSIS — R251 Tremor, unspecified: Secondary | ICD-10-CM | POA: Diagnosis not present

## 2016-09-08 LAB — LIPID PANEL
Cholesterol: 174 mg/dL (ref 0–200)
HDL: 63.2 mg/dL (ref >39.00)
LDL CALC: 96 mg/dL (ref 0–99)
NonHDL: 110.44
TRIGLYCERIDES: 71 mg/dL (ref 0.0–149.0)
Total CHOL/HDL Ratio: 3
VLDL: 14.2 mg/dL (ref 0.0–40.0)

## 2016-09-08 LAB — CBC WITH DIFFERENTIAL/PLATELET
BASOS PCT: 1.4 % (ref 0.0–3.0)
Basophils Absolute: 0.1 10*3/uL (ref 0.0–0.1)
EOS ABS: 0.2 10*3/uL (ref 0.0–0.7)
EOS PCT: 3.4 % (ref 0.0–5.0)
HEMATOCRIT: 32.2 % — AB (ref 36.0–46.0)
HEMOGLOBIN: 10.8 g/dL — AB (ref 12.0–15.0)
LYMPHS PCT: 29.3 % (ref 12.0–46.0)
Lymphs Abs: 1.9 10*3/uL (ref 0.7–4.0)
MCHC: 33.6 g/dL (ref 30.0–36.0)
MCV: 93.9 fl (ref 78.0–100.0)
Monocytes Absolute: 0.5 10*3/uL (ref 0.1–1.0)
Monocytes Relative: 7.7 % (ref 3.0–12.0)
NEUTROS ABS: 3.8 10*3/uL (ref 1.4–7.7)
Neutrophils Relative %: 58.2 % (ref 43.0–77.0)
PLATELETS: 282 10*3/uL (ref 150.0–400.0)
RBC: 3.42 Mil/uL — ABNORMAL LOW (ref 3.87–5.11)
RDW: 13.8 % (ref 11.5–15.5)
WBC: 6.6 10*3/uL (ref 4.0–10.5)

## 2016-09-08 LAB — COMPREHENSIVE METABOLIC PANEL
ALBUMIN: 3.9 g/dL (ref 3.5–5.2)
ALT: 15 U/L (ref 0–35)
AST: 16 U/L (ref 0–37)
Alkaline Phosphatase: 74 U/L (ref 39–117)
BUN: 7 mg/dL (ref 6–23)
CALCIUM: 9 mg/dL (ref 8.4–10.5)
CHLORIDE: 101 meq/L (ref 96–112)
CO2: 30 mEq/L (ref 19–32)
CREATININE: 0.83 mg/dL (ref 0.40–1.20)
GFR: 71.73 mL/min (ref >60.00)
Glucose, Bld: 118 mg/dL — ABNORMAL HIGH (ref 70–99)
POTASSIUM: 4.1 meq/L (ref 3.5–5.1)
Sodium: 137 mEq/L (ref 135–145)
Total Bilirubin: 0.4 mg/dL (ref 0.2–1.2)
Total Protein: 6.5 g/dL (ref 6.0–8.3)

## 2016-09-08 LAB — VITAMIN D 25 HYDROXY (VIT D DEFICIENCY, FRACTURES): VITD: 23.01 ng/mL — AB (ref 30.00–100.00)

## 2016-09-08 NOTE — Progress Notes (Signed)
Subjective:   Tiffany [name] is a 71 y.o. female who presents for Medicare Annual (Subsequent) preventive examination.  Review of Systems:  N/A Cardiac Risk Factors include: advanced age (>80mn, >>47women)     Objective:     Vitals: BP 124/80 (BP Location: Right Arm, Patient Position: Sitting, Cuff Size: Normal)   Pulse 71   Temp 99.3 F (37.4 C) (Oral)   Ht 5' 3.25" (1.607 m) Comment: no shoes  Wt 128 lb 8 oz (58.3 kg)   SpO2 97%   BMI 22.58 kg/m   Body mass index is 22.58 kg/m.   Tobacco History  Smoking Status  . Never Smoker  Smokeless Tobacco  . Never Used     Counseling given: No   Past Medical History:  Diagnosis Date  . Anxiety and depression   . Arthritis    knee  . Dupuytren's contracture of hand   . Esophageal spasm   . GERD (gastroesophageal reflux disease)   . Heart murmur   . History of MRI of cervical spine 09/98   Dr. DRolin Barry . History of MRI of lumbar spine 10/1998   Dr. DRolin Barry . Hyperlipemia   . Hypertension   . Osteoporosis 07/15/1999  . Plantar fasciitis, bilateral    Past Surgical History:  Procedure Laterality Date  . 2 D Echo  08/1999~04/13/2006   Mild MVP, MILD MR, Mild aortic sclerosis  . Abd ultrasound  03/02/1999   NML, no gallstones  . CARDIAC CATHETERIZATION  01/2001   Nonobstructive CAD  . ESOPHAGOGASTRODUODENOSCOPY     Sliding H. H. ~ 07/1995-11/2003 Neg  . ESOPHAGOGASTRODUODENOSCOPY N/A 08/10/2012   Procedure: ESOPHAGOGASTRODUODENOSCOPY (EGD);  Surgeon: DLafayette Dragon MD;  [facility];  Service: Endoscopy;  Laterality: N/A;  . KNEE SURGERY    . [place] . NM LEXISCAN MYOVIEW LTD  03/26/2012   LOW RISK; no ischemia or infarction. Gut attenuation.  .Marland KitchenSAVORY DILATION N/A 08/10/2012   Procedure: SAVORY DILATION;  Surgeon: DLafayette Dragon MD;  Location: [facility];  Service: Endoscopy;  Laterality: N/A;  . SPIROMETRY  06/2004   NML  . TONSILLECTOMY AND ADENOIDECTOMY  as a child  .  TRANSTHORACIC ECHOCARDIOGRAM  02/11/2011   Normal LV Size & function; ef ~~09-47% grade 1 diastolic dysfunction. MILD MAC no mitral stenosis and trace mitral regurgitation, no comment of prolapse  . TUBAL LIGATION  1975   benign tumor   Family History  Problem Relation Age of Onset  . Breast cancer Mother   . Throat cancer Father    History  Sexual Activity  . Sexual activity: Not on file    Outpatient Encounter Prescriptions as of 09/08/2016  Medication Sig  . ALPRAZolam (XANAX) 0.5 MG tablet Take 0.5 mg by mouth 3 (three) times daily as needed for sleep or anxiety.  .Marland KitchenEPINEPHrine (EPIPEN IJ) Inject as directed as needed.  . methocarbamol (ROBAXIN-750) 750 MG tablet Take 750 mg by mouth 3 (three) times daily.    . metoprolol (LOPRESSOR) 50 MG tablet TAKE ONE (1) TABLET BY MOUTH TWO (2) TIMES DAILY  . nortriptyline (PAMELOR) 25 MG capsule TAKE ONE CAPSULE BY MOUTH EVERY NIGHT ATBEDTIME WITH THE 50MG CAPSULE  . nortriptyline (PAMELOR) 50 MG capsule TAKE ONE CAPSULE BY MOUTH EVERY NIGHT AT BEDTIME  . pantoprazole (PROTONIX) 40 MG tablet TAKE ONE (1) TABLET BY MOUTH EVERY DAY  . ranitidine (ZANTAC) 150 MG tablet Take 1 tablet (150 mg total) by mouth  at bedtime.   No facility-administered encounter medications on file as of 09/08/2016.     Activities of Daily Living In your present state of health, do you have any difficulty performing the following activities: 09/08/2016  Hearing? N  Vision? Y  Difficulty concentrating or making decisions? N  Walking or climbing stairs? Y  Dressing or bathing? N  Doing errands, shopping? N  Preparing Food and eating ? N  Using the Toilet? N  In the past six months, have you accidently leaked urine? N  Do you have problems with loss of bowel control? N  Managing your Medications? N  Managing your Finances? N  Housekeeping or managing your Housekeeping? N  Some recent data might be hidden    Patient Care Team: [name], MD as PCP -  General    Assessment:     Hearing Screening   _0  _1  _2  _3  _4  _5  _6  _7  _8   Right ear:   40 40 40  40    Left ear:   40 0 40  0      Visual Acuity Screening   Right eye Left eye Both eyes  Without correction: 20/70 20/200 20/70  With correction:       Exercise Activities and Dietary recommendations Current Exercise Habits: Home exercise routine, Type of exercise: Other - see comments;walking (stationary bike), Time (Minutes): 60, Frequency (Times/Week): 3, Weekly Exercise (Minutes/Week): 180, Intensity: Mild, Exercise limited by: None identified  Goals    . Increase physical activity          Starting 09/08/2016, I will continue to exercise at least 60 min 3-4 days per week.       Fall Risk Fall Risk  09/08/2016 05/12/2015 08/27/2014  Falls in the past year? No No No   Depression Screen PHQ 2/9 Scores 09/08/2016 01/21/2016 05/12/2015 08/27/2014  PHQ - 2 Score 1 0 0 3  PHQ- 9 Score 4 3 - 6     Cognitive Function MMSE - Mini Mental State Exam 09/08/2016  Orientation to time 5  Orientation to Place 5  Registration 3  Attention/ Calculation 0  Recall 3  Language- name 2 objects 0  Language- repeat 1  Language- follow 3 step command 3  Language- read & follow direction 0  Write a sentence 0  Copy design 0  Total score 20       PLEASE NOTE: A Mini-Cog screen was completed. Maximum score is 20. A value of 0 denotes this part of Folstein MMSE was not completed or the patient failed this part of the Mini-Cog screening.   Mini-Cog Screening Orientation to Time - Max 5 pts Orientation to Place - Max 5 pts Registration - Max 3 pts Recall - Max 3 pts Language Repeat - Max 1 pts Language Follow 3 Step Command - Max 3 pts c  Immunization History  Administered Date(s) Administered  . Hepatitis B 06/13/1996, 07/14/1996, 11/11/1996  . Influenza Whole 04/09/2007, 04/01/2008, 04/24/2009, 04/06/2010  . Influenza,inj,Quad PF,36+ Mos 03/19/2013,  02/26/2014, 05/12/2015, 03/11/2016  . Pneumococcal Conjugate-13 05/01/2014  . Pneumococcal Polysaccharide-23 04/24/2009  . Td 06/13/2000   Screening Tests Health Maintenance  Topic Date Due  . COLON CANCER SCREENING ANNUAL FOBT  05/31/2017 (Originally 06/01/2016)  . TETANUS/TDAP  09/10/2017 (Originally 06/13/2010)  . MAMMOGRAM  04/13/2019 (Originally 04/12/2013)  . INFLUENZA VACCINE  Completed  . DEXA SCAN  Completed  . Hepatitis C Screening  Completed  . PNA vac Low Risk Adult  Completed  Plan:     I have personally reviewed and addressed the Medicare Annual Wellness questionnaire and have noted the following in the patient's chart:  A. Medical and social history B. Use of alcohol, tobacco or illicit drugs  C. Current medications and supplements D. Functional ability and status E.  Nutritional status F.  Physical activity G. Advance directives H. List of other physicians I.  Hospitalizations, surgeries, and ER visits in previous 12 months J.  [place] to include hearing, vision, cognitive, depression L. Referrals and appointments - none  In addition, I have reviewed and discussed with patient certain preventive protocols, quality metrics, and best practice recommendations. A written personalized care plan for preventive services as well as general preventive health recommendations were provided to patient.  See attached scanned questionnaire for additional information.   Signed,   Lindell Noe, MHA, BS, LPN Health Coach

## 2016-09-08 NOTE — Telephone Encounter (Signed)
-----   Message from Tarlesia R Pinson, LPN sent at 09/04/2016  7:44 PM EDT ----- Regarding: Lab order request 3/29 If none, pls let me know so I can contact pt regarding fasting.  

## 2016-09-08 NOTE — Progress Notes (Signed)
PCP notes:   Health maintenance:  Colon cancer screening - pt states she completed a FOBT kit in 2017  Tetanus - postponed/insurance  Abnormal screenings:   Hearing - failed Depression score: 4  Patient concerns:   None  Nurse concerns:  None  Next PCP appt:   09/15/16 @ 1445

## 2016-09-08 NOTE — Progress Notes (Signed)
Pre visit review using our clinic review tool, if applicable. No additional management support is needed unless otherwise documented below in the visit note. 

## 2016-09-08 NOTE — Patient Instructions (Signed)
[name] , Thank you for taking time to come for your Medicare Wellness Visit. I appreciate your ongoing commitment to your health goals. Please review the following plan we discussed and let me know if I can assist you in the future.   These are the goals we discussed: Goals    . Increase physical activity          Starting 09/08/2016, I will continue to exercise at least 60 min 3-4 days per week.        This is a list of the screening recommended for you and due dates:  Health Maintenance  Topic Date Due  . Stool Blood Test  05/31/2017*  . Tetanus Vaccine  09/10/2017*  . Mammogram  04/13/2019*  . Flu Shot  Completed  . DEXA scan (bone density measurement)  Completed  .  Hepatitis C: One time screening is recommended by Center for Disease Control  (CDC) for  adults born from [birth year]1945 through 1965.   Completed  . Pneumonia vaccines  Completed  *Topic was postponed. The date shown is not the original due date.   Preventive Care for Adults  A healthy lifestyle and preventive care can promote health and wellness. Preventive health guidelines for adults include the following key practices.  . A routine yearly physical is a good way to check with your health care provider about your health and preventive screening. It is a chance to share any concerns and updates on your health and to receive a thorough exam.  . Visit your dentist for a routine exam and preventive care every 6 months. Brush your teeth twice a day and floss once a day. Good oral hygiene prevents tooth decay and gum disease.  . The frequency of eye exams is based on your age, health, family medical history, use  of contact lenses, and other factors. Follow your health care provider's ecommendations for frequency of eye exams.  . Eat a healthy diet. Foods like vegetables, fruits, whole grains, low-fat dairy products, and lean protein foods contain the nutrients you need without too many calories. Decrease your intake of  foods high in solid fats, added sugars, and salt. Eat the right amount of calories for you. Get information about a proper diet from your health care provider, if necessary.  . Regular physical exercise is one of the most important things you can do for your health. Most adults should get at least 150 minutes of moderate-intensity exercise (any activity that increases your heart rate and causes you to sweat) each week. In addition, most adults need muscle-strengthening exercises on 2 or more days a week.  Silver Sneakers may be a benefit available to you. To determine eligibility, you may visit the website: www.silversneakers.com or contact program at [phone] Mon-Fri between 8AM-8PM.   . Maintain a healthy weight. The body mass index (BMI) is a screening tool to identify possible weight problems. It provides an estimate of body fat based on height and weight. Your health care provider can find your BMI and can help you achieve or maintain a healthy weight.   For adults 20 years and older: ? A BMI below 18.5 is considered underweight. ? A BMI of 18.5 to 24.9 is normal. ? A BMI of 25 to 29.9 is considered overweight. ? A BMI of 30 and above is considered obese.   . Maintain normal blood lipids and cholesterol levels by exercising and minimizing your intake of saturated fat. Eat a balanced diet with plenty of fruit  and vegetables. Blood tests for lipids and cholesterol should begin at age 27 and be repeated every 5 years. If your lipid or cholesterol levels are high, you are over 50, or you are at high risk for heart disease, you may need your cholesterol levels checked more frequently. Ongoing high lipid and cholesterol levels should be treated with medicines if diet and exercise are not working.  . If you smoke, find out from your health care provider how to quit. If you do not use tobacco, please do not start.  . If you choose to drink alcohol, please do not consume more than 2 drinks per  day. One drink is considered to be 12 ounces (355 mL) of beer, 5 ounces (148 mL) of wine, or 1.5 ounces (44 mL) of liquor.  . If you are 40-69 years old, ask your health care provider if you should take aspirin to prevent strokes.  . Use sunscreen. Apply sunscreen liberally and repeatedly throughout the day. You should seek shade when your shadow is shorter than you. Protect yourself by wearing long sleeves, pants, a wide-brimmed hat, and sunglasses year round, whenever you are outdoors.  . Once a month, do a whole body skin exam, using a mirror to look at the skin on your back. Tell your health care provider of new moles, moles that have irregular borders, moles that are larger than a pencil eraser, or moles that have changed in shape or color.

## 2016-09-08 NOTE — Telephone Encounter (Signed)
-----   Message from Robert Bellowarlesia R Pinson, LPN sent at 1/61/09603/25/2018  7:44 PM EDT ----- Regarding: Lab order request 3/29 If none, pls let me know so I can contact pt regarding fasting.

## 2016-09-11 NOTE — Progress Notes (Signed)
I reviewed health advisor's note, was available for consultation, and agree with documentation and plan.  

## 2016-09-12 DIAGNOSIS — Z79899 Other long term (current) drug therapy: Secondary | ICD-10-CM | POA: Diagnosis not present

## 2016-09-12 DIAGNOSIS — M791 Myalgia: Secondary | ICD-10-CM | POA: Diagnosis not present

## 2016-09-12 DIAGNOSIS — G894 Chronic pain syndrome: Secondary | ICD-10-CM | POA: Diagnosis not present

## 2016-09-15 ENCOUNTER — Ambulatory Visit (INDEPENDENT_AMBULATORY_CARE_PROVIDER_SITE_OTHER): Payer: Medicare Other | Admitting: Family Medicine

## 2016-09-15 ENCOUNTER — Encounter: Payer: Self-pay | Admitting: Family Medicine

## 2016-09-15 VITALS — BP 110/60 | HR 83 | Temp 98.2°F | Ht 63.25 in

## 2016-09-15 DIAGNOSIS — D649 Anemia, unspecified: Secondary | ICD-10-CM

## 2016-09-15 DIAGNOSIS — I10 Essential (primary) hypertension: Secondary | ICD-10-CM

## 2016-09-15 DIAGNOSIS — M810 Age-related osteoporosis without current pathological fracture: Secondary | ICD-10-CM | POA: Diagnosis not present

## 2016-09-15 DIAGNOSIS — K5909 Other constipation: Secondary | ICD-10-CM | POA: Insufficient documentation

## 2016-09-15 DIAGNOSIS — Z862 Personal history of diseases of the blood and blood-forming organs and certain disorders involving the immune mechanism: Secondary | ICD-10-CM | POA: Insufficient documentation

## 2016-09-15 DIAGNOSIS — Z0001 Encounter for general adult medical examination with abnormal findings: Secondary | ICD-10-CM | POA: Diagnosis not present

## 2016-09-15 DIAGNOSIS — F331 Major depressive disorder, recurrent, moderate: Secondary | ICD-10-CM | POA: Diagnosis not present

## 2016-09-15 DIAGNOSIS — K219 Gastro-esophageal reflux disease without esophagitis: Secondary | ICD-10-CM

## 2016-09-15 DIAGNOSIS — H35372 Puckering of macula, left eye: Secondary | ICD-10-CM | POA: Diagnosis not present

## 2016-09-15 DIAGNOSIS — Z0000 Encounter for general adult medical examination without abnormal findings: Secondary | ICD-10-CM

## 2016-09-15 DIAGNOSIS — H25812 Combined forms of age-related cataract, left eye: Secondary | ICD-10-CM | POA: Diagnosis not present

## 2016-09-15 DIAGNOSIS — M5030 Other cervical disc degeneration, unspecified cervical region: Secondary | ICD-10-CM | POA: Diagnosis not present

## 2016-09-15 DIAGNOSIS — H26491 Other secondary cataract, right eye: Secondary | ICD-10-CM | POA: Diagnosis not present

## 2016-09-15 MED ORDER — NORTRIPTYLINE HCL 25 MG PO CAPS: ORAL_CAPSULE | ORAL | 1 refills | Status: DC

## 2016-09-15 MED ORDER — BISACODYL 5 MG PO TBEC: 5.0000 mg | DELAYED_RELEASE_TABLET | Freq: Every day | ORAL | 0 refills | Status: DC | PRN

## 2016-09-15 MED ORDER — NORTRIPTYLINE HCL 50 MG PO CAPS: 50.0000 mg | ORAL_CAPSULE | Freq: Every day | ORAL | 1 refills | Status: DC

## 2016-09-15 NOTE — Assessment & Plan Note (Signed)
Improved with ESI Using robaxin prn.  No longer on narcotics.  On muscle relaxant.

## 2016-09-15 NOTE — Assessment & Plan Note (Signed)
Well controlled on nortriptyline.

## 2016-09-15 NOTE — Assessment & Plan Note (Addendum)
Increase fiber, water  In diet. Can use miralax daily and  for difficult to treat constipation try dulcolax.  Last BM 5 days ago.. Likely cause of RLQ pain.

## 2016-09-15 NOTE — Progress Notes (Signed)
Subjective:    Patient ID: Tiffany [name], female    DOB: 16-Oct-1945, 71 y.o.   MRN: [id number]  HPI   The patient saw Candis Musa, LPN for medicare wellness. Note reviewed in detail and important notes copied below. Health maintenance: Colon cancer screening - pt states she completed a FOBT kit in 2017 Tetanus - postponed/insurance  Abnormal screenings:  Hearing - failed Depression score: 4  She presents today for complete physical exam and follow up on multiple chronic issues. 09/15/16   Chronic pain:  Last saw Dr. Alfonso Ramus 2 weeks ago for neck pain. Followed again by pain center for neck [pain for DDD. No longer followed at pain center. NO need for narcotics. Pt managing pain with robaxin. S/P ESI  This week. Has helped some.  GERD: stable control on both PPI and H2 blocker.   Anxiety, generalized, depression, major moderate: well controlled on nortriptyline 75 mg daily at bedtime. Trying to deal with news today of eye surgery needed for cataracts.  Hypertension:    Well controlled on toprol XL. BP Readings from Last 3 Encounters:  09/15/16 110/60  09/08/16 124/80  08/19/16 132/78  Using medication without problems or lightheadedness: none Chest pain with exertion:none Edema:none Short of breath:none Average home BPs: Other issues:  Elevated Cholesterol:  Well controlled with lifestyle, on no med , Improving overtime. Lab Results  Component Value Date   CHOL 174 09/08/2016   HDL 63.20 09/08/2016   LDLCALC 96 09/08/2016   LDLDIRECT 138.7 01/12/2012   TRIG 71.0 09/08/2016   CHOLHDL 3 09/08/2016  Diet compliance: moderate Exercise: walking daily, yard work Other complaints:   Anemia, new:  She feels she is not getting much iron in diet.  No blood in stool, no hematuria, no vaginal bleeding.   Social History /Family History/Past Medical History reviewed and updated if needed. Blood pressure 110/60, pulse 83, temperature 98.2 F (36.8 C),  temperature source Oral, height 5' 3.25" (1.607 m).  Review of Systems  Constitutional: Negative for fatigue and fever.  HENT: Negative for congestion.   Eyes: Negative for pain.  Respiratory: Negative for cough and shortness of breath.   Cardiovascular: Negative for chest pain, palpitations and leg swelling.  Gastrointestinal: Positive for constipation. Negative for abdominal pain.  Genitourinary: Negative for dysuria and vaginal bleeding.  Musculoskeletal: Negative for back pain.  Neurological: Negative for syncope, light-headedness and headaches.  Psychiatric/Behavioral: Negative for dysphoric mood.       Objective:   Physical Exam  Constitutional: Vital signs are normal. She appears well-developed and well-nourished. She is cooperative.  Non-toxic appearance. She does not appear ill. No distress.  HENT:  Head: Normocephalic.  Right Ear: Hearing, tympanic membrane, external ear and ear canal normal. Tympanic membrane is not erythematous, not retracted and not bulging.  Left Ear: Hearing, tympanic membrane, external ear and ear canal normal. Tympanic membrane is not erythematous, not retracted and not bulging.  Nose: Nose normal. No mucosal edema or rhinorrhea. Right sinus exhibits no maxillary sinus tenderness and no frontal sinus tenderness. Left sinus exhibits no maxillary sinus tenderness and no frontal sinus tenderness.  Mouth/Throat: Uvula is midline, oropharynx is clear and moist and mucous membranes are normal.  Eyes: Conjunctivae, EOM and lids are normal. Pupils are equal, round, and reactive to light. Lids are everted and swept, no foreign bodies found.  Neck: Trachea normal and normal range of motion. Neck supple. Carotid bruit is not present. No thyroid mass and no thyromegaly present.  Cardiovascular: Normal  rate, regular rhythm, S1 normal, S2 normal, normal heart sounds, intact distal pulses and normal pulses.  Exam reveals no gallop and no friction rub.   No murmur  heard. Pulmonary/Chest: Effort normal and breath sounds normal. No tachypnea. No respiratory distress. She has no decreased breath sounds. She has no wheezes. She has no rhonchi. She has no rales.  Abdominal: Soft. Normal appearance and bowel sounds are normal. She exhibits no distension, no fluid wave, no abdominal bruit and no mass. There is no hepatosplenomegaly. There is tenderness in the right lower quadrant. There is no rebound, no guarding and no CVA tenderness. No hernia.  Lymphadenopathy:    She has no cervical adenopathy.    She has no axillary adenopathy.  Neurological: She is alert. She has normal strength. No cranial nerve deficit or sensory deficit.  Skin: Skin is warm, dry and intact. No rash noted.  Psychiatric: Her speech is normal and behavior is normal. Judgment and thought content normal. Her mood appears not anxious. Cognition and memory are normal. She does not exhibit a depressed mood.          Assessment & Plan:  The patient's preventative maintenance and recommended screening tests for an annual wellness exam were reviewed in full today. Brought up to date unless services declined.  Counselled on the importance of diet, exercise, and its role in overall health and mortality. The patient's FH and SH was reviewed, including their home life, tobacco status, and drug and alcohol status.   Vaccines:Uptodate with flu, Prevnar, pneumovax and  Refused TD, no shingles vaccine since latex allergy.   Mammo: refused, will do breast exam today.  DVE/PAP: pap not indicated given age, No indication DVE: asymptomatic, no family history.  DEXA: Last 06/2011. She has osteoporosis on fosamax x 5 years.. Will hold fosamax this year 2015, recheck bone density in 2 years (2017). Colon: last ifob in 2011, previous colon 2005ish, nml, plan  cologuard Nonsmoker.  Hep C: done,  Father with hep C.

## 2016-09-15 NOTE — Assessment & Plan Note (Signed)
Controlled with pantoprazole and using zantac as needed for breakthrough reflux.

## 2016-09-15 NOTE — Patient Instructions (Addendum)
Start vit d3 400 IU  twice daily.  Increase fiber, water  In diet. Can use miralax daily and  for difficult to treat constipation try dulcolax.  Please stop at the front desk to set up referral.

## 2016-09-15 NOTE — Assessment & Plan Note (Signed)
Well controlled. Continue current medication.  

## 2016-09-15 NOTE — Assessment & Plan Note (Signed)
Low vi D need to replete. Start supplement OTC.

## 2016-09-15 NOTE — Assessment & Plan Note (Signed)
Unclear cause. Recheck and eval iron levels.

## 2016-09-15 NOTE — Progress Notes (Signed)
Pre visit review using our clinic review tool, if applicable. No additional management support is needed unless otherwise documented below in the visit note. 

## 2016-09-20 ENCOUNTER — Encounter: Payer: Self-pay | Admitting: Family Medicine

## 2016-09-20 DIAGNOSIS — Z1211 Encounter for screening for malignant neoplasm of colon: Secondary | ICD-10-CM | POA: Diagnosis not present

## 2016-09-20 DIAGNOSIS — Z1212 Encounter for screening for malignant neoplasm of rectum: Secondary | ICD-10-CM | POA: Diagnosis not present

## 2016-09-22 ENCOUNTER — Other Ambulatory Visit: Payer: Medicare Other

## 2016-09-22 DIAGNOSIS — M791 Myalgia: Secondary | ICD-10-CM | POA: Diagnosis not present

## 2016-09-22 DIAGNOSIS — M542 Cervicalgia: Secondary | ICD-10-CM | POA: Diagnosis not present

## 2016-09-23 ENCOUNTER — Ambulatory Visit (INDEPENDENT_AMBULATORY_CARE_PROVIDER_SITE_OTHER)
Admission: RE | Admit: 2016-09-23 | Discharge: 2016-09-23 | Disposition: A | Discharge status: Home / Self Care | Payer: Medicare Other | Source: Ambulatory Visit | Attending: Family Medicine | Admitting: Family Medicine

## 2016-09-23 DIAGNOSIS — M810 Age-related osteoporosis without current pathological fracture: Secondary | ICD-10-CM

## 2016-09-28 ENCOUNTER — Encounter: Payer: Self-pay | Admitting: *Deleted

## 2016-09-28 LAB — COLOGUARD

## 2016-10-12 NOTE — Telephone Encounter (Signed)
Scheduled 09/08/16

## 2016-10-18 DIAGNOSIS — M542 Cervicalgia: Secondary | ICD-10-CM | POA: Diagnosis not present

## 2016-10-18 DIAGNOSIS — M791 Myalgia: Secondary | ICD-10-CM | POA: Diagnosis not present

## 2016-10-24 DIAGNOSIS — H2512 Age-related nuclear cataract, left eye: Secondary | ICD-10-CM | POA: Diagnosis not present

## 2016-11-15 ENCOUNTER — Other Ambulatory Visit (INDEPENDENT_AMBULATORY_CARE_PROVIDER_SITE_OTHER): Payer: Medicare Other

## 2016-11-15 DIAGNOSIS — D649 Anemia, unspecified: Secondary | ICD-10-CM | POA: Diagnosis not present

## 2016-11-15 LAB — CBC WITH DIFFERENTIAL/PLATELET
BASOS ABS: 0.1 10*3/uL (ref 0.0–0.1)
BASOS PCT: 1.1 % (ref 0.0–3.0)
EOS ABS: 0.2 10*3/uL (ref 0.0–0.7)
Eosinophils Relative: 3.4 % (ref 0.0–5.0)
HCT: 38.9 % (ref 36.0–46.0)
Hemoglobin: 12.8 g/dL (ref 12.0–15.0)
LYMPHS ABS: 1.9 10*3/uL (ref 0.7–4.0)
Lymphocytes Relative: 32.5 % (ref 12.0–46.0)
MCHC: 32.8 g/dL (ref 30.0–36.0)
MCV: 94.6 fl (ref 78.0–100.0)
MONOS PCT: 8.4 % (ref 3.0–12.0)
Monocytes Absolute: 0.5 10*3/uL (ref 0.1–1.0)
NEUTROS ABS: 3.2 10*3/uL (ref 1.4–7.7)
NEUTROS PCT: 54.6 % (ref 43.0–77.0)
Platelets: 300 10*3/uL (ref 150.0–400.0)
RBC: 4.11 Mil/uL (ref 3.87–5.11)
RDW: 13.9 % (ref 11.5–15.5)
WBC: 5.8 10*3/uL (ref 4.0–10.5)

## 2016-11-15 LAB — IBC PANEL
IRON: 90 ug/dL (ref 42–145)
Saturation Ratios: 28.6 % (ref 20.0–50.0)
TRANSFERRIN: 225 mg/dL (ref 212.0–360.0)

## 2016-11-15 LAB — FERRITIN: Ferritin: 57.6 ng/mL (ref 10.0–291.0)

## 2016-11-16 ENCOUNTER — Encounter: Payer: Self-pay | Admitting: *Deleted

## 2016-11-16 DIAGNOSIS — H25812 Combined forms of age-related cataract, left eye: Secondary | ICD-10-CM | POA: Diagnosis not present

## 2016-11-16 DIAGNOSIS — H2512 Age-related nuclear cataract, left eye: Secondary | ICD-10-CM | POA: Diagnosis not present

## 2016-11-17 ENCOUNTER — Other Ambulatory Visit: Payer: Medicare Other

## 2016-11-28 DIAGNOSIS — M62838 Other muscle spasm: Secondary | ICD-10-CM | POA: Diagnosis not present

## 2016-11-28 DIAGNOSIS — G894 Chronic pain syndrome: Secondary | ICD-10-CM | POA: Diagnosis not present

## 2016-11-28 DIAGNOSIS — Z79899 Other long term (current) drug therapy: Secondary | ICD-10-CM | POA: Diagnosis not present

## 2017-02-20 DIAGNOSIS — M62838 Other muscle spasm: Secondary | ICD-10-CM | POA: Diagnosis not present

## 2017-02-20 DIAGNOSIS — G894 Chronic pain syndrome: Secondary | ICD-10-CM | POA: Diagnosis not present

## 2017-02-20 DIAGNOSIS — Z79899 Other long term (current) drug therapy: Secondary | ICD-10-CM | POA: Diagnosis not present

## 2017-03-20 ENCOUNTER — Other Ambulatory Visit: Payer: Self-pay | Admitting: Family Medicine

## 2017-03-29 ENCOUNTER — Other Ambulatory Visit: Payer: Self-pay | Admitting: Family Medicine

## 2017-03-29 NOTE — Telephone Encounter (Signed)
Last office visit 09/15/2016.  Last refilled 09/15/2016 for #90 with 1 refills  Ok to refill?

## 2017-04-13 ENCOUNTER — Ambulatory Visit (INDEPENDENT_AMBULATORY_CARE_PROVIDER_SITE_OTHER): Payer: Medicare Other

## 2017-04-13 DIAGNOSIS — Z23 Encounter for immunization: Secondary | ICD-10-CM | POA: Diagnosis not present

## 2017-05-25 DIAGNOSIS — Z79899 Other long term (current) drug therapy: Secondary | ICD-10-CM | POA: Diagnosis not present

## 2017-05-25 DIAGNOSIS — G894 Chronic pain syndrome: Secondary | ICD-10-CM | POA: Diagnosis not present

## 2017-05-25 DIAGNOSIS — M1712 Unilateral primary osteoarthritis, left knee: Secondary | ICD-10-CM | POA: Diagnosis not present

## 2017-05-25 DIAGNOSIS — M7721 Periarthritis, right wrist: Secondary | ICD-10-CM | POA: Diagnosis not present

## 2017-06-08 ENCOUNTER — Ambulatory Visit: Payer: Self-pay

## 2017-06-08 NOTE — Telephone Encounter (Signed)
Pt called with c/o bruising around a dog bite. The bruising/bite is located left mid calf.  Bite occurred 05/24/17 and was unprovoked. Pt was walking in front of dog when was bit. Last know tetanus 5-6 years ago. Pt states she has been treating wound and does not look infected. Advised pt to draw outline of bruising to see if it enlarges. Bishop Dublinalled Rena at PCP office and ok with waiting to see pt tomorrow. Appt 1230 tomorrow with Dr Para Marchuncan.  Reason for Disposition . [1] Any break in skin (e.g., cut, puncture or scratch) AND[2] dog, cat, or ferret at risk for RABIES (e.g., sick, stray, unprovoked bite, developing country)  Answer Assessment - Initial Assessment Questions 1. ANIMAL: "What type of animal caused the bite?" "Is the injury from a bite or a claw?" If the animal is a dog or a cat, ask: "Was it a pet or a stray?" "Was it acting ill or behaving strangely?"     Dog pet Suddenly bit her Dog is pit bull 2. LOCATION: "Where is the bite located?"      Left leg calf area  3. SIZE: "How big is the bite?" "What does it look like?"      1 tooth broke the skin 4. ONSET: "When did the bite happen?" (Minutes or hours ago)      05/24/17 5. CIRCUMSTANCES: "Tell me how this happened."      Was walking in front and he bit her 6. TETANUS: "When was the last tetanus booster?"     5-6 years ago 7. PREGNANCY: "Is there any chance you are pregnant?" "When was your last menstrual period?"     n/a  Protocols used: ANIMAL BITE-A-AH

## 2017-06-08 NOTE — Telephone Encounter (Signed)
There was no available appts today and pt already had appt scheduled with Dr Para Marchuncan on 06/09/17. Does pt need an update on tetanus inj?Please advise.

## 2017-06-08 NOTE — Telephone Encounter (Signed)
Needs tdap update. Keep the area clean and covered, mark the border.   See what information can be obtained about the dog- vaccination status, etc.  We don't have rabies vaccine here.  That would need to be done at ER if dog was rabid, unvaccinated, etc.  Thanks.

## 2017-06-08 NOTE — Telephone Encounter (Addendum)
Patient says she was bitten in 05/24/2017 and has been trying ever since she was bitten to get the dog's vaccination status.  She was bitten by her grandson's dog and they are not cooperating with getting the information.  Patient says it has healed up except there is a hard knot under it.  Patient will keep appointment scheduled for tomorrow.

## 2017-06-09 ENCOUNTER — Encounter: Payer: Self-pay | Admitting: Family Medicine

## 2017-06-09 ENCOUNTER — Ambulatory Visit: Payer: Medicare Other | Admitting: Family Medicine

## 2017-06-09 VITALS — BP 116/60 | HR 84 | Temp 98.7°F | Wt 128.5 lb

## 2017-06-09 DIAGNOSIS — S81852A Open bite, left lower leg, initial encounter: Secondary | ICD-10-CM | POA: Diagnosis not present

## 2017-06-09 DIAGNOSIS — W540XXA Bitten by dog, initial encounter: Secondary | ICD-10-CM

## 2017-06-09 DIAGNOSIS — Z23 Encounter for immunization: Secondary | ICD-10-CM

## 2017-06-09 MED ORDER — AMOXICILLIN-POT CLAVULANATE 875-125 MG PO TABS: 1.0000 | ORAL_TABLET | Freq: Two times a day (BID) | ORAL | 0 refills | Status: DC

## 2017-06-09 NOTE — Telephone Encounter (Signed)
Agreed -

## 2017-06-09 NOTE — Patient Instructions (Signed)
If the dog is still healthy, then you shouldn't need rabies vaccination.  If you have spreading redness then start augmentin and update us.  Take care.  Glad to see you.

## 2017-06-09 NOTE — Progress Notes (Signed)
Dog bite 05/24/17.  Her grandson's dog.  Per patient, her grandson stated the dog was vaccinated.  Patient was going down the ramp at her home.  She was at the end of the ramp, using the rail.  The dog jumped forward and bit her on the L calf.  She had on 2 pairs of pants and socks.  It bled but didn't drain pus.  She cleaned it at the time.    The dog had not bitten anyone else per patient report.  The dog is still acting normally o/w.  The dog hasn't been frothing at the mouth.    Per patient report, the dogs tooth was not able to penetrate through multiple layers of clothing.  Meds, vitals, and allergies reviewed.   ROS: Per HPI unless specifically indicated in ROS section   nad ncat rrr ctab L calf with 0.5x1 cm scab and 14x8 bruise.   Able to bear weight.  No spreading erythema.  No fluctuant mass. She does have some thickening of the skin locally but this appears to be related to the bruising not from an abscess.

## 2017-06-11 DIAGNOSIS — W540XXA Bitten by dog, initial encounter: Secondary | ICD-10-CM | POA: Insufficient documentation

## 2017-06-11 NOTE — Assessment & Plan Note (Signed)
D/w pt about options.  Td updated today.  If the dog is still healthy, then she shouldn't need rabies vaccination.  Given the timeline of the event, the dog would be out of the window for rabies symptoms.  Discussed with patient. If she has spreading redness then start augmentin and update us.  She agrees.  I will defer to patient about potentially reporting the dog to animal control.

## 2017-07-04 DIAGNOSIS — Z79899 Other long term (current) drug therapy: Secondary | ICD-10-CM | POA: Diagnosis not present

## 2017-07-04 DIAGNOSIS — M47818 Spondylosis without myelopathy or radiculopathy, sacral and sacrococcygeal region: Secondary | ICD-10-CM | POA: Diagnosis not present

## 2017-07-04 DIAGNOSIS — G894 Chronic pain syndrome: Secondary | ICD-10-CM | POA: Diagnosis not present

## 2017-07-07 DIAGNOSIS — M4608 Spinal enthesopathy, sacral and sacrococcygeal region: Secondary | ICD-10-CM | POA: Diagnosis not present

## 2017-07-07 DIAGNOSIS — M47818 Spondylosis without myelopathy or radiculopathy, sacral and sacrococcygeal region: Secondary | ICD-10-CM | POA: Diagnosis not present

## 2017-07-18 DIAGNOSIS — H35371 Puckering of macula, right eye: Secondary | ICD-10-CM | POA: Diagnosis not present

## 2017-07-18 DIAGNOSIS — H26493 Other secondary cataract, bilateral: Secondary | ICD-10-CM | POA: Diagnosis not present

## 2017-07-18 DIAGNOSIS — H04123 Dry eye syndrome of bilateral lacrimal glands: Secondary | ICD-10-CM | POA: Diagnosis not present

## 2017-07-18 DIAGNOSIS — H353131 Nonexudative age-related macular degeneration, bilateral, early dry stage: Secondary | ICD-10-CM | POA: Diagnosis not present

## 2017-07-18 DIAGNOSIS — H40013 Open angle with borderline findings, low risk, bilateral: Secondary | ICD-10-CM | POA: Diagnosis not present

## 2017-08-07 ENCOUNTER — Encounter: Payer: Self-pay | Admitting: Family Medicine

## 2017-08-07 ENCOUNTER — Ambulatory Visit (INDEPENDENT_AMBULATORY_CARE_PROVIDER_SITE_OTHER): Payer: Medicare Other | Admitting: Family Medicine

## 2017-08-07 VITALS — BP 126/58 | HR 72 | Temp 98.8°F | Ht 63.25 in | Wt 132.5 lb

## 2017-08-07 DIAGNOSIS — N3000 Acute cystitis without hematuria: Secondary | ICD-10-CM | POA: Insufficient documentation

## 2017-08-07 DIAGNOSIS — R300 Dysuria: Secondary | ICD-10-CM

## 2017-08-07 LAB — POC URINALSYSI DIPSTICK (AUTOMATED)
Bilirubin, UA: NEGATIVE
Glucose, UA: NEGATIVE
KETONES UA: NEGATIVE
Nitrite, UA: NEGATIVE
PH UA: 6 (ref 5.0–8.0)
Spec Grav, UA: 1.015 (ref 1.010–1.025)
Urobilinogen, UA: 0.2 E.U./dL

## 2017-08-07 MED ORDER — CEPHALEXIN 250 MG PO CAPS: 250.0000 mg | ORAL_CAPSULE | Freq: Two times a day (BID) | ORAL | 0 refills | Status: DC

## 2017-08-07 NOTE — Patient Instructions (Addendum)
Take the keflex as directed for uti  Drink lots of water  We will alert you when urine culture returns   Update if not starting to improve in several days or if worsening      Urinary Tract Infection, Adult A urinary tract infection (UTI) is an infection of any part of the urinary tract, which includes the kidneys, ureters, bladder, and urethra. These organs make, store, and get rid of urine in the body. UTI can be a bladder infection (cystitis) or kidney infection (pyelonephritis). What are the causes? This infection may be caused by fungi, viruses, or bacteria. Bacteria are the most common cause of UTIs. This condition can also be caused by repeated incomplete emptying of the bladder during urination. What increases the risk? This condition is more likely to develop if:  You ignore your need to urinate or hold urine for long periods of time.  You do not empty your bladder completely during urination.  You wipe back to front after urinating or having a bowel movement, if you are female.  You are uncircumcised, if you are female.  You are constipated.  You have a urinary catheter that stays in place (indwelling).  You have a weak defense (immune) system.  You have a medical condition that affects your bowels, kidneys, or bladder.  You have diabetes.  You take antibiotic medicines frequently or for long periods of time, and the antibiotics no longer work well against certain types of infections (antibiotic resistance).  You take medicines that irritate your urinary tract.  You are exposed to chemicals that irritate your urinary tract.  You are female.  What are the signs or symptoms? Symptoms of this condition include:  Fever.  Frequent urination or passing small amounts of urine frequently.  Needing to urinate urgently.  Pain or burning with urination.  Urine that smells bad or unusual.  Cloudy urine.  Pain in the lower abdomen or back.  Trouble  urinating.  Blood in the urine.  Vomiting or being less hungry than normal.  Diarrhea or abdominal pain.  Vaginal discharge, if you are female.  How is this diagnosed? This condition is diagnosed with a medical history and physical exam. You will also need to provide a urine sample to test your urine. Other tests may be done, including:  Blood tests.  Sexually transmitted disease (STD) testing.  If you have had more than one UTI, a cystoscopy or imaging studies may be done to determine the cause of the infections. How is this treated? Treatment for this condition often includes a combination of two or more of the following:  Antibiotic medicine.  Other medicines to treat less common causes of UTI.  Over-the-counter medicines to treat pain.  Drinking enough water to stay hydrated.  Follow these instructions at home:  Take over-the-counter and prescription medicines only as told by your health care provider.  If you were prescribed an antibiotic, take it as told by your health care provider. Do not stop taking the antibiotic even if you start to feel better.  Avoid alcohol, caffeine, tea, and carbonated beverages. They can irritate your bladder.  Drink enough fluid to keep your urine clear or pale yellow.  Keep all follow-up visits as told by your health care provider. This is important.  Make sure to: ? Empty your bladder often and completely. Do not hold urine for long periods of time. ? Empty your bladder before and after sex. ? Wipe from front to back after a bowel  movement if you are female. Use each tissue one time when you wipe. Contact a health care provider if:  You have back pain.  You have a fever.  You feel nauseous or vomit.  Your symptoms do not get better after 3 days.  Your symptoms go away and then return. Get help right away if:  You have severe back pain or lower abdominal pain.  You are vomiting and cannot keep down any medicines or  water. This information is not intended to replace advice given to you by your health care provider. Make sure you discuss any questions you have with your health care provider. Document Released: 03/09/2005 Document Revised: 11/11/2015 Document Reviewed: 04/20/2015 Elsevier Interactive Patient Education  [employer].

## 2017-08-07 NOTE — Progress Notes (Signed)
Subjective:    Patient ID: Tiffany [name], female    DOB: 07-Jun-1946, 72 y.o.   MRN: [id number]  HPI  Here for urinary symptoms  Started Thursday  She drank a lot of water and cranberry juice over the weekend   Urgency and frequency  Dysuria  Low back pain No blood in urine  No flank pain  No appetite but not nauseated No fever    ua -looks positive  Results for orders placed or performed in visit on 08/07/17  POCT Urinalysis Dipstick (Automated)  Result Value Ref Range   Color, UA Yellow    Clarity, UA Cloudy    Glucose, UA Negative    Bilirubin, UA Negative    Ketones, UA Negative    Spec Grav, UA 1.015 1.010 - 1.025   Blood, UA 50 Ery/uL    pH, UA 6.0 5.0 - 8.0   Protein, UA 30 mg/dL    Urobilinogen, UA 0.2 0.2 or 1.0 E.U./dL   Nitrite, UA Negative    Leukocytes, UA Large (3+) (A) Negative      Patient Active Problem List   Diagnosis Date Noted  . Dog bite 06/11/2017  . DDD (degenerative disc disease), cervical 09/15/2016  . Normocytic anemia 09/15/2016  . Chronic constipation 09/15/2016  . Influenza with respiratory manifestation 06/01/2016  . Tremor 01/21/2016  . Memory loss 01/21/2016  . Cough 07/23/2015  . Heart palpitations 07/02/2015  . Frequent unifocal PVCs 07/02/2015  . Counseling regarding end of life decision making 05/01/2014  . Chronic pain 01/22/2012  . OA (osteoarthritis) 03/27/2011  . Hiatal hernia 03/24/2011  . Depression, major, recurrent ([place]) 07/13/2010  . Essential hypertension, benign 04/06/2010  . ALLERGIC RHINITIS 03/22/2007  . DERMATITIS, CONTACT, NOS 02/23/2007  . Hyperlipidemia 10/18/2006  . Mitral valve disorder 10/18/2006  . GERD 10/18/2006  . BAKER'S CYST, LEFT KNEE 10/18/2006  . Osteoporosis 07/15/1999   Past Medical History:  Diagnosis Date  . Anxiety and depression   . Arthritis    knee  . Dupuytren's contracture of hand   . Esophageal spasm   . GERD (gastroesophageal reflux disease)   . Heart murmur   .  History of MRI of cervical spine 09/98   Dr. Rolin Barry  . History of MRI of lumbar spine 10/1998   Dr. Rolin Barry  . Hyperlipemia   . Hypertension   . Osteoporosis 07/15/1999  . Plantar fasciitis, bilateral    Past Surgical History:  Procedure Laterality Date  . 2 D Echo  08/1999~04/13/2006   Mild MVP, MILD MR, Mild aortic sclerosis  . Abd ultrasound  03/02/1999   NML, no gallstones  . CARDIAC CATHETERIZATION  01/2001   Nonobstructive CAD  . ESOPHAGOGASTRODUODENOSCOPY     Sliding H. H. ~ 07/1995-11/2003 Neg  . ESOPHAGOGASTRODUODENOSCOPY N/A 08/10/2012   Procedure: ESOPHAGOGASTRODUODENOSCOPY (EGD);  Surgeon: Lafayette Dragon, MD;  Location: [facility];  Service: Endoscopy;  Laterality: N/A;  . KNEE SURGERY    . [place]  . NM LEXISCAN MYOVIEW LTD  03/26/2012   LOW RISK; no ischemia or infarction. Gut attenuation.  [name] SAVORY DILATION N/A 08/10/2012   Procedure: SAVORY DILATION;  Surgeon: Lafayette Dragon, MD;  Location: [facility];  Service: Endoscopy;  Laterality: N/A;  . SPIROMETRY  06/2004   NML  . TONSILLECTOMY AND ADENOIDECTOMY  as a child  . TRANSTHORACIC ECHOCARDIOGRAM  02/11/2011   Normal LV Size & function; ef ~55-73%; grade 1 diastolic dysfunction. MILD MAC no mitral stenosis and  trace mitral regurgitation, no comment of prolapse  . TUBAL LIGATION  1975   benign tumor   Social History   Tobacco Use  . Smoking status: Never Smoker  . Smokeless tobacco: Never Used  Substance Use Topics  . Alcohol use: No    Alcohol/week: 0.0 oz  . Drug use: No   Family History  Problem Relation Age of Onset  . Breast cancer Mother   . Throat cancer Father    Allergies  Allergen Reactions  . Aspirin Other (See Comments)    REACTION: SWELLING  . Latex Anaphylaxis    REACTION: HIVES  . Codeine     REACTION: SWELLING  . Fexofenadine     REACTION: VOMITING  . Nsaids     REACTION: SWELLING  . Tramadol     REACTION: ITCH  . Zolpidem Tartrate     REACTION: unknown    Current Outpatient Medications on File Prior to Visit  Medication Sig Dispense Refill  . ALPRAZolam (XANAX) 0.5 MG tablet Take 0.5 mg by mouth 3 (three) times daily as needed for sleep or anxiety.    . bisacodyl (BISACODYL) 5 MG EC tablet Take 1 tablet (5 mg total) by mouth daily as needed for moderate constipation. 30 tablet 0  . methocarbamol (ROBAXIN-750) 750 MG tablet Take 750 mg by mouth 3 (three) times daily.      . metoprolol (LOPRESSOR) 50 MG tablet TAKE ONE (1) TABLET BY MOUTH TWO (2) TIMES DAILY 180 tablet 3  . nortriptyline (PAMELOR) 25 MG capsule TAKE ONE CAPSULE BY MOUTH EVERY NIGHT ATBEDTIME WITH THE 50MG CAPSULE 90 capsule 1  . nortriptyline (PAMELOR) 50 MG capsule TAKE ONE CAPSULE BY MOUTH EVERY NIGHT ATBEDTIME WITH 25MG CAPSULE 90 capsule 1  . pantoprazole (PROTONIX) 40 MG tablet TAKE ONE (1) TABLET BY MOUTH EVERY DAY 90 tablet 1   No current facility-administered medications on file prior to visit.     Review of Systems  Constitutional: Positive for fatigue. Negative for activity change, appetite change and fever.  HENT: Negative for congestion and sore throat.   Eyes: Negative for itching and visual disturbance.  Respiratory: Negative for cough and shortness of breath.   Cardiovascular: Negative for leg swelling.  Gastrointestinal: Negative for abdominal distention, abdominal pain, constipation, diarrhea and nausea.  Endocrine: Negative for cold intolerance and polydipsia.  Genitourinary: Positive for dysuria, frequency and urgency. Negative for difficulty urinating, flank pain and hematuria.  Musculoskeletal: Negative for myalgias.  Skin: Negative for rash.  Allergic/Immunologic: Negative for immunocompromised state.  Neurological: Negative for dizziness and weakness.  Hematological: Negative for adenopathy.       Objective:   Physical Exam  Constitutional: She appears well-developed and well-nourished. No distress.  Well appearing   HENT:  Head:  Normocephalic and atraumatic.  Eyes: Conjunctivae and EOM are normal. Pupils are equal, round, and reactive to light.  Neck: Normal range of motion. Neck supple.  Cardiovascular: Normal rate, regular rhythm and normal heart sounds.  Pulmonary/Chest: Effort normal and breath sounds normal.  Abdominal: Soft. Bowel sounds are normal. She exhibits no distension. There is tenderness. There is no rebound.  No cva tenderness  Mild suprapubic tenderness  Musculoskeletal: She exhibits no edema.  Lymphadenopathy:    She has no cervical adenopathy.  Neurological: She is alert.  Skin: No rash noted. No pallor.  Psychiatric: She has a normal mood and affect.          Assessment & Plan:   Problem List Items Addressed This  Visit      Genitourinary   Acute cystitis - Primary    Pos ua  cx sent -will update  Cover with keflex  Enc fluids  Update if not starting to improve in a several days or if worsening   Handout given on uti       Relevant Orders   Urine Culture    Other Visit Diagnoses    Dysuria       Relevant Orders   POCT Urinalysis Dipstick (Automated) (Completed)

## 2017-08-07 NOTE — Assessment & Plan Note (Signed)
Pos ua  cx sent -will update  Cover with keflex  Enc fluids  Update if not starting to improve in a several days or if worsening   Handout given on uti

## 2017-08-10 ENCOUNTER — Telehealth: Payer: Self-pay | Admitting: Family Medicine

## 2017-08-10 LAB — URINE CULTURE
MICRO NUMBER: 90243776
SPECIMEN QUALITY:: ADEQUATE

## 2017-08-10 MED ORDER — SULFAMETHOXAZOLE-TRIMETHOPRIM 800-160 MG PO TABS: 1.0000 | ORAL_TABLET | Freq: Two times a day (BID) | ORAL | 0 refills | Status: DC

## 2017-08-10 NOTE — Telephone Encounter (Signed)
Pt notified of urine cx results and Dr. Royden Purlower's comments. Pt will go pick up new abx

## 2017-08-10 NOTE — Telephone Encounter (Signed)
Please let her know that her uti is resistant to the keflex given  Stop if if not done I have sent a sulfa abx (bactrim ds) [facility]  Alert us if no improving with that

## 2017-08-22 DIAGNOSIS — Z79899 Other long term (current) drug therapy: Secondary | ICD-10-CM | POA: Diagnosis not present

## 2017-08-22 DIAGNOSIS — M4608 Spinal enthesopathy, sacral and sacrococcygeal region: Secondary | ICD-10-CM | POA: Diagnosis not present

## 2017-08-22 DIAGNOSIS — G894 Chronic pain syndrome: Secondary | ICD-10-CM | POA: Diagnosis not present

## 2017-09-01 ENCOUNTER — Other Ambulatory Visit: Payer: Self-pay | Admitting: Cardiology

## 2017-09-01 ENCOUNTER — Other Ambulatory Visit: Payer: Self-pay | Admitting: Family Medicine

## 2017-09-01 NOTE — Telephone Encounter (Signed)
REFILL 

## 2017-09-11 ENCOUNTER — Ambulatory Visit: Payer: Medicare Other

## 2017-09-14 ENCOUNTER — Telehealth: Payer: Self-pay | Admitting: Family Medicine

## 2017-09-14 ENCOUNTER — Ambulatory Visit (INDEPENDENT_AMBULATORY_CARE_PROVIDER_SITE_OTHER): Payer: Medicare Other

## 2017-09-14 VITALS — BP 112/60 | HR 71 | Temp 98.8°F | Ht 63.5 in | Wt 129.5 lb

## 2017-09-14 DIAGNOSIS — M810 Age-related osteoporosis without current pathological fracture: Secondary | ICD-10-CM

## 2017-09-14 DIAGNOSIS — E7849 Other hyperlipidemia: Secondary | ICD-10-CM | POA: Diagnosis not present

## 2017-09-14 DIAGNOSIS — D649 Anemia, unspecified: Secondary | ICD-10-CM

## 2017-09-14 DIAGNOSIS — Z0000 Encounter for general adult medical examination without abnormal findings: Secondary | ICD-10-CM

## 2017-09-14 LAB — CBC WITH DIFFERENTIAL/PLATELET
BASOS ABS: 0.1 10*3/uL (ref 0.0–0.1)
BASOS PCT: 1.5 % (ref 0.0–3.0)
EOS PCT: 2.7 % (ref 0.0–5.0)
Eosinophils Absolute: 0.2 10*3/uL (ref 0.0–0.7)
HEMATOCRIT: 38.2 % (ref 36.0–46.0)
Hemoglobin: 12.7 g/dL (ref 12.0–15.0)
LYMPHS ABS: 1.9 10*3/uL (ref 0.7–4.0)
Lymphocytes Relative: 31 % (ref 12.0–46.0)
MCHC: 33.3 g/dL (ref 30.0–36.0)
MCV: 93 fl (ref 78.0–100.0)
MONOS PCT: 8.4 % (ref 3.0–12.0)
Monocytes Absolute: 0.5 10*3/uL (ref 0.1–1.0)
NEUTROS ABS: 3.4 10*3/uL (ref 1.4–7.7)
NEUTROS PCT: 56.4 % (ref 43.0–77.0)
PLATELETS: 311 10*3/uL (ref 150.0–400.0)
RBC: 4.1 Mil/uL (ref 3.87–5.11)
RDW: 13.6 % (ref 11.5–15.5)
WBC: 6 10*3/uL (ref 4.0–10.5)

## 2017-09-14 LAB — COMPREHENSIVE METABOLIC PANEL
ALT: 10 U/L (ref 0–35)
AST: 17 U/L (ref 0–37)
Albumin: 4 g/dL (ref 3.5–5.2)
Alkaline Phosphatase: 79 U/L (ref 39–117)
BUN: 9 mg/dL (ref 6–23)
CHLORIDE: 97 meq/L (ref 96–112)
CO2: 29 meq/L (ref 19–32)
Calcium: 9.2 mg/dL (ref 8.4–10.5)
Creatinine, Ser: 0.96 mg/dL (ref 0.40–1.20)
GFR: 60.47 mL/min (ref >60.00)
GLUCOSE: 97 mg/dL (ref 70–99)
POTASSIUM: 4.3 meq/L (ref 3.5–5.1)
Sodium: 133 mEq/L — ABNORMAL LOW (ref 135–145)
Total Bilirubin: 0.4 mg/dL (ref 0.2–1.2)
Total Protein: 7 g/dL (ref 6.0–8.3)

## 2017-09-14 LAB — LIPID PANEL
CHOL/HDL RATIO: 3
Cholesterol: 198 mg/dL (ref 0–200)
HDL: 65.7 mg/dL (ref >39.00)
LDL Cholesterol: 113 mg/dL — ABNORMAL HIGH (ref 0–99)
NONHDL: 131.98
Triglycerides: 95 mg/dL (ref 0.0–149.0)
VLDL: 19 mg/dL (ref 0.0–40.0)

## 2017-09-14 LAB — VITAMIN D 25 HYDROXY (VIT D DEFICIENCY, FRACTURES): VITD: 29.79 ng/mL — ABNORMAL LOW (ref 30.00–100.00)

## 2017-09-14 NOTE — Telephone Encounter (Signed)
-----   Message from Robert Bellowarlesia R Pinson, LPN sent at 1/6/10964/07/2017  8:56 AM EDT ----- Regarding: Labs 4/4 Lab orders needed. Thank you.  [facility]  [facility]

## 2017-09-14 NOTE — Progress Notes (Signed)
PCP notes:   Health maintenance:  No gaps identified.   Abnormal screenings:   Hearing - failed  Hearing Screening   125Hz  250Hz  500Hz  1000Hz  2000Hz  3000Hz  4000Hz  6000Hz  8000Hz   Right ear:   40 0 40  0    Left ear:   40 40 40  40    [facility]   Patient concerns:   Patient is concerned about chronic constipation.  Patient would like PCP to begin writing order for Xanax.  Patient is emotionally distraught about granddaughter.   Nurse concerns:  None  Next PCP appt:   09/28/17 @ 1430

## 2017-09-14 NOTE — Progress Notes (Signed)
Subjective:   Tiffany [name] is a 72 y.o. female who presents for Medicare Annual (Subsequent) preventive examination.  Review of Systems:  N/A Cardiac Risk Factors include: advanced age (>94mn, >>86women);hypertension;dyslipidemia     Objective:     Vitals: BP 112/60 (BP Location: Right Arm, Patient Position: Sitting, Cuff Size: Normal)   Pulse 71   Temp 98.8 F (37.1 C) (Oral)   Ht 5' 3.5" (1.613 m) Comment: no shoes  Wt 129 lb 8 oz (58.7 kg)   SpO2 100%   BMI 22.58 kg/m   Body mass index is 22.58 kg/m.  Advanced Directives 09/14/2017 09/08/2016 08/10/2012  Does Patient Have a Medical Advance Directive? No Yes Patient does not have advance directive  Type of Advance Directive - [place] -  Copy of [place]? - No - copy requested -  Would patient like information on creating a medical advance directive? Yes (MAU/Ambulatory/Procedural Areas - Information given) - -    Tobacco Social History   Tobacco Use  Smoking Status Never Smoker  Smokeless Tobacco Never Used     Counseling given: No   Clinical Intake:  Pre-visit preparation completed: Yes  Pain : No/denies pain Pain Score: 0-No pain     Nutritional Status: BMI of 19-24  Normal Nutritional Risks: None Diabetes: No  How often do you need to have someone help you when you read instructions, pamphlets, or other written materials from your doctor or pharmacy?: 1 - Never What is the last grade level you completed in school?: GED  Interpreter Needed?: No  Comments: pt lives with spouse Information entered by :: LPinson, LPN  Past Medical History:  Diagnosis Date  . Anxiety and depression   . Arthritis    knee  . Dupuytren's contracture of hand   . Esophageal spasm   . GERD (gastroesophageal reflux disease)   . Heart murmur   . History of MRI of cervical spine 09/98   Dr. DRolin Barry . History of MRI of lumbar spine 10/1998   Dr. DRolin Barry   . Hyperlipemia   . Hypertension   . Osteoporosis 07/15/1999  . Plantar fasciitis, bilateral    Past Surgical History:  Procedure Laterality Date  . 2 D Echo  08/1999~04/13/2006   Mild MVP, MILD MR, Mild aortic sclerosis  . Abd ultrasound  03/02/1999   NML, no gallstones  . CARDIAC CATHETERIZATION  01/2001   Nonobstructive CAD  . ESOPHAGOGASTRODUODENOSCOPY     Sliding H. H. ~ 07/1995-11/2003 Neg  . ESOPHAGOGASTRODUODENOSCOPY N/A 08/10/2012   Procedure: ESOPHAGOGASTRODUODENOSCOPY (EGD);  Surgeon: DLafayette Dragon MD;  [facility];  Service: Endoscopy;  Laterality: N/A;  . KNEE SURGERY    . [place] . NM LEXISCAN MYOVIEW LTD  03/26/2012   LOW RISK; no ischemia or infarction. Gut attenuation.  .Marland KitchenSAVORY DILATION N/A 08/10/2012   Procedure: SAVORY DILATION;  Surgeon: DLafayette Dragon MD;  Location: [facility];  Service: Endoscopy;  Laterality: N/A;  . SPIROMETRY  06/2004   NML  . TONSILLECTOMY AND ADENOIDECTOMY  as a child  . TRANSTHORACIC ECHOCARDIOGRAM  02/11/2011   Normal LV Size & function; ef ~~23-34% grade 1 diastolic dysfunction. MILD MAC no mitral stenosis and trace mitral regurgitation, no comment of prolapse  . TUBAL LIGATION  1975   benign tumor   Family History  Problem Relation Age of Onset  . Breast cancer Mother   . Throat cancer  Father    Social History   Socioeconomic History  . Marital status: Married    Spouse name: Not on file  . Number of children: 2  . Years of education: Not on file  . Highest education level: Not on file  Occupational History  . Occupation: Nurse's Special educational needs teacher: RETIRED  Social Needs  . Financial resource strain: Not on file  . Food insecurity:    Worry: Not on file    Inability: Not on file  . Transportation needs:    Medical: Not on file    Non-medical: Not on file  Tobacco Use  . Smoking status: Never Smoker  . Smokeless tobacco: Never Used  Substance and Sexual Activity  . Alcohol use: No     Alcohol/week: 0.0 oz  . Drug use: No  . Sexual activity: Not on file  Lifestyle  . Physical activity:    Days per week: Not on file    Minutes per session: Not on file  . Stress: Not on file  Relationships  . Social connections:    Talks on phone: Not on file    Gets together: Not on file    Attends religious service: Not on file    Active member of club or organization: Not on file    Attends meetings of clubs or organizations: Not on file    Relationship status: Not on file  Other Topics Concern  . Not on file  Social History Narrative   72 year old, white woman, married mother of 3 with only one child living. She has 3 grandchildren. Her son that had spina bifida died at age 57 08-10-2009).   Never smoked. Does not drink alcohol.    Outpatient Encounter Medications as of 09/14/2017  Medication Sig  . ALPRAZolam (XANAX) 0.5 MG tablet Take 0.5 mg by mouth 3 (three) times daily as needed for sleep or anxiety.  . bisacodyl (BISACODYL) 5 MG EC tablet Take 1 tablet (5 mg total) by mouth daily as needed for moderate constipation.  . methocarbamol (ROBAXIN-750) 750 MG tablet Take 750 mg by mouth 3 (three) times daily.    . metoprolol tartrate (LOPRESSOR) 50 MG tablet TAKE ONE (1) TABLET BY MOUTH TWO (2) TIMES DAILY  . nortriptyline (PAMELOR) 25 MG capsule TAKE ONE CAPSULE BY MOUTH EVERY NIGHT ATBEDTIME WITH THE 50MG CAPSULE  . nortriptyline (PAMELOR) 50 MG capsule TAKE ONE CAPSULE BY MOUTH EVERY NIGHT ATBEDTIME WITH 25MG CAPSULE  . pantoprazole (PROTONIX) 40 MG tablet TAKE ONE (1) TABLET BY MOUTH EVERY DAY  . [DISCONTINUED] sulfamethoxazole-trimethoprim (BACTRIM DS,SEPTRA DS) 800-160 MG tablet Take 1 tablet by mouth 2 (two) times daily.   No facility-administered encounter medications on file as of 09/14/2017.     Activities of Daily Living In your present state of health, do you have any difficulty performing the following activities: 09/14/2017  Hearing? N  Vision? N  Difficulty  concentrating or making decisions? N  Walking or climbing stairs? N  Dressing or bathing? N  Doing errands, shopping? N  Preparing Food and eating ? N  Using the Toilet? N  In the past six months, have you accidently leaked urine? N  Do you have problems with loss of bowel control? N  Managing your Medications? N  Managing your Finances? N  Housekeeping or managing your Housekeeping? N  Some recent data might be hidden    Patient Care Team: [name], MD as PCP - General    Assessment:  This is a routine wellness examination for [name].   Hearing Screening   _0  _1  _2  _3  _4  _5  _6  _7  _8   Right ear:   40 0 40  0    Left ear:   40 40 40  40    V[facility] Screening Comments: [facility]  Exercise Activities and Dietary recommendations Current Exercise Habits: Home exercise routine, Type of exercise: stretching, Time (Minutes): 30, Frequency (Times/Week): 7, Weekly Exercise (Minutes/Week): 210, Intensity: Mild, Exercise limited by: None identified  Goals    . DIET - INCREASE WATER INTAKE     Starting 09/14/2017, I will continue to drink at least 6-8 glasses of water daily.        Fall Risk Fall Risk  09/14/2017 09/08/2016 05/12/2015 08/27/2014  Falls in the past year? No No No No   Depression Screen PHQ 2/9 Scores 09/14/2017 09/08/2016 01/21/2016 05/12/2015  PHQ - 2 Score 0 1 0 0  PHQ- 9 Score 0 4 3 -     Cognitive Function MMSE - Mini Mental State Exam 09/14/2017 09/08/2016  Orientation to time 5 5  Orientation to Place 5 5  Registration 3 3  Attention/ Calculation 0 0  Recall 3 3  Language- name 2 objects 0 0  Language- repeat 1 1  Language- follow 3 step command 3 3  Language- read & follow direction 0 0  Write a sentence 0 0  Copy design 0 0  Total score 20 20     PLEASE NOTE: A Mini-Cog screen was completed. Maximum score is 20. A value of 0 denotes this part of Folstein MMSE was not completed or the patient  failed this part of the Mini-Cog screening.   Mini-Cog Screening Orientation to Time - Max 5 pts Orientation to Place - Max 5 pts Registration - Max 3 pts Recall - Max 3 pts Language Repeat - Max 1 pts Language Follow 3 Step Command - Max 3 pts   Immunization History  Administered Date(s) Administered  . Hepatitis B 06/13/1996, 07/14/1996, 11/11/1996  . Influenza Whole 04/09/2007, 04/01/2008, 04/24/2009, 04/06/2010  . Influenza,inj,Quad PF,6+ Mos 03/19/2013, 02/26/2014, 05/12/2015, 03/11/2016, 04/13/2017  . Pneumococcal Conjugate-13 05/01/2014  . Pneumococcal Polysaccharide-23 04/24/2009  . Td 06/13/2000, 06/09/2017    Screening Tests Health Maintenance  Topic Date Due  . MAMMOGRAM  04/13/2019 (Originally 04/12/2013)  . INFLUENZA VACCINE  01/11/2018  . Fecal DNA (Cologuard)  09/21/2019  . TETANUS/TDAP  06/10/2027  . DEXA SCAN  Completed  . Hepatitis C Screening  Completed  . PNA vac Low Risk Adult  Completed      Plan:     I have personally reviewed, addressed, and noted the following in the patient's chart:  A. Medical and social history B. Use of alcohol, tobacco or illicit drugs  C. Current medications and supplements D. Functional ability and status E.  Nutritional status F.  Physical activity G. Advance directives H. List of other physicians I.  Hospitalizations, surgeries, and ER visits in previous 12 months J.  [place] to include hearing, vision, cognitive, depression L. Referrals and appointments - none  In addition, I have reviewed and discussed with patient certain preventive protocols, quality metrics, and best practice recommendations. A written personalized care plan for preventive services as well as general preventive health recommendations were provided to patient.  See attached scanned questionnaire for additional information.   Signed,   Lindell Noe, MHA, BS, LPN Health Coach

## 2017-09-14 NOTE — Patient Instructions (Signed)
Tiffany [name] , Thank you for taking time to come for your Medicare Wellness Visit. I appreciate your ongoing commitment to your health goals. Please review the following plan we discussed and let me know if I can assist you in the future.   These are the goals we discussed: Goals    . DIET - INCREASE WATER INTAKE     Starting 09/14/2017, I will continue to drink at least 6-8 glasses of water daily.        This is a list of the screening recommended for you and due dates:  Health Maintenance  Topic Date Due  . Mammogram  04/13/2019*  . Flu Shot  01/11/2018  . Cologuard (Stool DNA test)  09/21/2019  . Tetanus Vaccine  06/10/2027  . DEXA scan (bone density measurement)  Completed  .  Hepatitis C: One time screening is recommended by Center for Disease Control  (CDC) for  adults born from [birth year] through 1967.   Completed  . Pneumonia vaccines  Completed  *Topic was postponed. The date shown is not the original due date.   Preventive Care for Adults  A healthy lifestyle and preventive care can promote health and wellness. Preventive health guidelines for adults include the following key practices.  . A routine yearly physical is a good way to check with your health care provider about your health and preventive screening. It is a chance to share any concerns and updates on your health and to receive a thorough exam.  . Visit your dentist for a routine exam and preventive care every 6 months. Brush your teeth twice a day and floss once a day. Good oral hygiene prevents tooth decay and gum disease.  . The frequency of eye exams is based on your age, health, family medical history, use  of contact lenses, and other factors. Follow your health care provider's recommendations for frequency of eye exams.  . Eat a healthy diet. Foods like vegetables, fruits, whole grains, low-fat dairy products, and lean protein foods contain the nutrients you need without too many calories. Decrease your intake of  foods high in solid fats, added sugars, and salt. Eat the right amount of calories for you. Get information about a proper diet from your health care provider, if necessary.  . Regular physical exercise is one of the most important things you can do for your health. Most adults should get at least 150 minutes of moderate-intensity exercise (any activity that increases your heart rate and causes you to sweat) each week. In addition, most adults need muscle-strengthening exercises on 2 or more days a week.  Silver Sneakers may be a benefit available to you. To determine eligibility, you may visit the website: www.silversneakers.com or contact program at [phone] Mon-Fri between 8AM-8PM.   . Maintain a healthy weight. The body mass index (BMI) is a screening tool to identify possible weight problems. It provides an estimate of body fat based on height and weight. Your health care provider can find your BMI and can help you achieve or maintain a healthy weight.   For adults 20 years and older: ? A BMI below 18.5 is considered underweight. ? A BMI of 18.5 to 24.9 is normal. ? A BMI of 25 to 29.9 is considered overweight. ? A BMI of 30 and above is considered obese.   . Maintain normal blood lipids and cholesterol levels by exercising and minimizing your intake of saturated fat. Eat a balanced diet with plenty of fruit and vegetables. Blood  tests for lipids and cholesterol should begin at age 72 and be repeated every 5 years. If your lipid or cholesterol levels are high, you are over 50, or you are at high risk for heart disease, you may need your cholesterol levels checked more frequently. Ongoing high lipid and cholesterol levels should be treated with medicines if diet and exercise are not working.  . If you smoke, find out from your health care provider how to quit. If you do not use tobacco, please do not start.  . If you choose to drink alcohol, please do not consume more than 2 drinks per  day. One drink is considered to be 12 ounces (355 mL) of beer, 5 ounces (148 mL) of wine, or 1.5 ounces (44 mL) of liquor.  . If you are 57-23 years old, ask your health care provider if you should take aspirin to prevent strokes.  . Use sunscreen. Apply sunscreen liberally and repeatedly throughout the day. You should seek shade when your shadow is shorter than you. Protect yourself by wearing long sleeves, pants, a wide-brimmed hat, and sunglasses year round, whenever you are outdoors.  . Once a month, do a whole body skin exam, using a mirror to look at the skin on your back. Tell your health care provider of new moles, moles that have irregular borders, moles that are larger than a pencil eraser, or moles that have changed in shape or color.

## 2017-09-15 NOTE — Progress Notes (Signed)
I reviewed health advisor's note, was available for consultation, and agree with documentation and plan.  

## 2017-09-19 ENCOUNTER — Encounter: Payer: Medicare Other | Admitting: Family Medicine

## 2017-09-20 ENCOUNTER — Other Ambulatory Visit: Payer: Self-pay | Admitting: Family Medicine

## 2017-09-28 ENCOUNTER — Encounter: Payer: Self-pay | Admitting: Family Medicine

## 2017-09-28 ENCOUNTER — Ambulatory Visit (INDEPENDENT_AMBULATORY_CARE_PROVIDER_SITE_OTHER): Payer: Medicare Other | Admitting: Family Medicine

## 2017-09-28 VITALS — BP 118/62 | HR 54 | Temp 98.3°F | Ht 63.5 in | Wt 127.0 lb

## 2017-09-28 DIAGNOSIS — I10 Essential (primary) hypertension: Secondary | ICD-10-CM | POA: Diagnosis not present

## 2017-09-28 DIAGNOSIS — K5909 Other constipation: Secondary | ICD-10-CM

## 2017-09-28 DIAGNOSIS — M545 Low back pain, unspecified: Secondary | ICD-10-CM

## 2017-09-28 DIAGNOSIS — E7849 Other hyperlipidemia: Secondary | ICD-10-CM | POA: Diagnosis not present

## 2017-09-28 DIAGNOSIS — F331 Major depressive disorder, recurrent, moderate: Secondary | ICD-10-CM | POA: Diagnosis not present

## 2017-09-28 DIAGNOSIS — G8929 Other chronic pain: Secondary | ICD-10-CM

## 2017-09-28 DIAGNOSIS — Z862 Personal history of diseases of the blood and blood-forming organs and certain disorders involving the immune mechanism: Secondary | ICD-10-CM | POA: Diagnosis not present

## 2017-09-28 DIAGNOSIS — Z0000 Encounter for general adult medical examination without abnormal findings: Secondary | ICD-10-CM | POA: Diagnosis not present

## 2017-09-28 MED ORDER — LINACLOTIDE 145 MCG PO CAPS: 145.0000 ug | ORAL_CAPSULE | Freq: Every day | ORAL | 5 refills | Status: DC
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
## 2017-09-28 NOTE — Patient Instructions (Addendum)
Start linzess generic daily for constipation.  Increase water and fiber.  Increase  exercsie .[name]. continur low fat low cholesterol diet.  Stop salt restriction.

## 2017-09-28 NOTE — Assessment & Plan Note (Signed)
Well controlled on pamelor.

## 2017-09-28 NOTE — Assessment & Plan Note (Signed)
Start trial of linzess daily for idiopathic constipation.

## 2017-09-28 NOTE — Assessment & Plan Note (Signed)
Well controlled. Continue current medication.  

## 2017-09-28 NOTE — Assessment & Plan Note (Signed)
14.6 % 10 year risk of CVD.[name]. pt refuses statin.. Will get back to exercise.

## 2017-09-28 NOTE — Assessment & Plan Note (Signed)
Resolved

## 2017-09-28 NOTE — Progress Notes (Signed)
Subjective:    Patient ID: Tiffany [name], female    DOB: 07-Jun-1946, 72 y.o.   MRN: [id number]  HPI  The patient presents for  complete physical and review of chronic health problems. He/She also has the following acute concerns today: constipation, anxiety  The patient saw Lu Duffel, LPN for medicare wellness. Note reviewed in detail and important notes copied below. Abnormal screenings:   Hearing - failed             Hearing Screening   125Hz  250Hz  500Hz  1000Hz  2000Hz  3000Hz  4000Hz  6000Hz  8000Hz   Right ear:   40 0 40  0    Left ear:   40 40 40  40    Vision Screening Comments: Feb 2019 with [facility]   Patient concerns:   Patient is concerned about chronic constipation.  Patient would like PCP to begin writing order for Xanax.  Patient is emotionally distraught about granddaughter.    Today 09/28/17  Elevated Cholesterol:    Poor control.  Statin indicated but Pt refuses. 14.6 % risk  10 year risk. Lab Results  Component Value Date   CHOL 198 09/14/2017   HDL 65.70 09/14/2017   LDLCALC 113 (H) 09/14/2017   LDLDIRECT 138.7 01/12/2012   TRIG 95.0 09/14/2017   CHOLHDL 3 09/14/2017  Using medications without problems: Muscle aches:  Diet compliance: good, does not each much Exercise: none Other complaints:  Hypertension:   Good control. On metorolol. BP Readings from Last 3 Encounters:  09/28/17 118/62  09/14/17 112/60  08/07/17 (!) 126/58  Using medication without problems or lightheadedness:  none Chest pain with exertion:none Edema:none Short of breath: none Average home BPs: Other issues:  MDD, resolved  GAD:  She has had increased stress lately with great granddaughter.. May have autism.  She is using nortriptyline at night. Using alprazolam three times a day.   Chronic back pain:  Using robaxin 2-3 times daily as needed for low back pain.  [facility]   Depression screen [facility] 2/9  09/14/2017 09/08/2016 01/21/2016  Decreased Interest 0 1 0  Down, Depressed, Hopeless 0 0 0  PHQ - 2 Score 0 1 0  Altered sleeping 0 2 2  Tired, decreased energy 0 0 -  Change in appetite 0 1 0  Feeling bad or failure about yourself  0 0 1  Trouble concentrating 0 0 0  Moving slowly or fidgety/restless 0 0 0  Suicidal thoughts 0 0 0  PHQ-9 Score 0 4 3  Difficult doing work/chores Not difficult at all Not difficult at all -     Chronic constipation:  Using dulcolax daily.[name]  Helps her have BMs once every 1-2 weeks.  No straining.  Miralax, fiber, water does not help.  Protonix helps with reflux.  Wt Readings from Last 3 Encounters:  09/28/17 127 lb (57.6 kg)  09/14/17 129 lb 8 oz (58.7 kg)  08/07/17 132 lb 8 oz (60.1 kg)   Body mass index is 22.14 kg/m.  Social History /Family History/Past Medical History reviewed in detail and updated in EMR if needed. Blood pressure 118/62, pulse (!) 54, temperature 98.3 F (36.8 C), temperature source Oral, height 5' 3.5" (1.613 m), weight 127 lb (57.6 kg), SpO2 99 %.   Review of Systems  Constitutional: Negative for fatigue and fever.  HENT: Negative for congestion.   Eyes: Negative for pain.  Respiratory: Negative for cough and shortness of breath.   Cardiovascular: Negative for  chest pain, palpitations and leg swelling.  Gastrointestinal: Negative for abdominal pain.  Genitourinary: Negative for dysuria and vaginal bleeding.  Musculoskeletal: Negative for back pain.  Neurological: Negative for syncope, light-headedness and headaches.  Psychiatric/Behavioral: Negative for dysphoric mood.       Objective:   Physical Exam  Constitutional: Vital signs are normal. She appears well-developed and well-nourished. She is cooperative.  Non-toxic appearance. She does not appear ill. No distress.  HENT:  Head: Normocephalic.  Right Ear: Hearing, tympanic membrane, external ear and ear canal normal.  Left Ear: Hearing, tympanic membrane,  external ear and ear canal normal.  Nose: Nose normal.  Eyes: Pupils are equal, round, and reactive to light. Conjunctivae, EOM and lids are normal. Lids are everted and swept, no foreign bodies found.  Neck: Trachea normal and normal range of motion. Neck supple. Carotid bruit is not present. No thyroid mass and no thyromegaly present.  Cardiovascular: Normal rate, regular rhythm, S1 normal, S2 normal, normal heart sounds and intact distal pulses. Exam reveals no gallop.  No murmur heard. Pulmonary/Chest: Effort normal and breath sounds normal. No respiratory distress. She has no wheezes. She has no rhonchi. She has no rales.  Abdominal: Soft. Normal appearance and bowel sounds are normal. She exhibits no distension, no fluid wave, no abdominal bruit and no mass. There is no hepatosplenomegaly. There is no tenderness. There is no rebound, no guarding and no CVA tenderness. No hernia.  Lymphadenopathy:    She has no cervical adenopathy.    She has no axillary adenopathy.  Neurological: She is alert. She has normal strength. No cranial nerve deficit or sensory deficit.  Skin: Skin is warm, dry and intact. No rash noted.   Residual bruise and resolving hematoma in left posterior  Calf.. May be muscl damage as well.  Psychiatric: Her speech is normal and behavior is normal. Judgment normal. Her mood appears not anxious. Cognition and memory are normal. She does not exhibit a depressed mood.          Assessment & Plan:  The patient's preventative maintenance and recommended screening tests for an annual wellness exam were reviewed in full today. Brought up to date unless services declined.  Counselled on the importance of diet, exercise, and its role in overall health and mortality. The patient's FH and SH was reviewed, including their home life, tobacco status, and drug and alcohol status.    Refused mammogram  cologuard 2018  Uptodate with vaccines Last DEXA 2018

## 2017-10-02 ENCOUNTER — Telehealth: Payer: Self-pay | Admitting: Family Medicine

## 2017-10-02 NOTE — Telephone Encounter (Signed)
Copied from CRM [phone]. Topic: Quick Communication - See Telephone Encounter >> Oct 02, 2017 11:06 AM Lorrine KinMcGee, Myrth Dahan B, NT wrote: CRM for notification. See Telephone encounter for: 10/02/17. Patient calling and is asking if the office has any samples of linaclotide (LINZESS) 145 MCG CAPS capsule. Patient states that she has 5 prescriptions at the pharmacy and this medication is just too expensive for her right now. She states she would like samples if the office has any. If not, could a different medication be prescribed that would be cheaper? [facility] - [place], [place] - [address]. Please advise. CB#: [phone]

## 2017-10-03 NOTE — Telephone Encounter (Signed)
Ms. Brougham notified as instructed by telephone. 

## 2017-10-03 NOTE — Telephone Encounter (Signed)
No samples.  Have patient call insurance to see if amitiza  Generic is cheaper.

## 2017-11-21 DIAGNOSIS — M4608 Spinal enthesopathy, sacral and sacrococcygeal region: Secondary | ICD-10-CM | POA: Diagnosis not present

## 2017-11-21 DIAGNOSIS — Z79899 Other long term (current) drug therapy: Secondary | ICD-10-CM | POA: Diagnosis not present

## 2017-11-21 DIAGNOSIS — G894 Chronic pain syndrome: Secondary | ICD-10-CM | POA: Diagnosis not present

## 2017-12-06 ENCOUNTER — Ambulatory Visit: Payer: Medicare Other | Admitting: Cardiology

## 2017-12-07 DIAGNOSIS — M25551 Pain in right hip: Secondary | ICD-10-CM | POA: Diagnosis not present

## 2017-12-07 DIAGNOSIS — S335XXA Sprain of ligaments of lumbar spine, initial encounter: Secondary | ICD-10-CM | POA: Diagnosis not present

## 2017-12-28 ENCOUNTER — Telehealth: Payer: Self-pay | Admitting: Family Medicine

## 2017-12-28 ENCOUNTER — Other Ambulatory Visit (INDEPENDENT_AMBULATORY_CARE_PROVIDER_SITE_OTHER): Payer: Medicare Other

## 2017-12-28 DIAGNOSIS — E7849 Other hyperlipidemia: Secondary | ICD-10-CM | POA: Diagnosis not present

## 2017-12-28 LAB — COMPREHENSIVE METABOLIC PANEL
ALBUMIN: 4 g/dL (ref 3.5–5.2)
ALT: 9 U/L (ref 0–35)
AST: 13 U/L (ref 0–37)
Alkaline Phosphatase: 74 U/L (ref 39–117)
BUN: 9 mg/dL (ref 6–23)
CALCIUM: 9.1 mg/dL (ref 8.4–10.5)
CO2: 32 mEq/L (ref 19–32)
CREATININE: 1.02 mg/dL (ref 0.40–1.20)
Chloride: 101 mEq/L (ref 96–112)
GFR: 56.34 mL/min — AB (ref >60.00)
Glucose, Bld: 115 mg/dL — ABNORMAL HIGH (ref 70–99)
Potassium: 4.2 mEq/L (ref 3.5–5.1)
Sodium: 137 mEq/L (ref 135–145)
Total Bilirubin: 0.4 mg/dL (ref 0.2–1.2)
Total Protein: 6.4 g/dL (ref 6.0–8.3)

## 2017-12-28 LAB — LIPID PANEL
CHOL/HDL RATIO: 3
Cholesterol: 198 mg/dL (ref 0–200)
HDL: 71.3 mg/dL (ref >39.00)
LDL CALC: 112 mg/dL — AB (ref 0–99)
NONHDL: 127.11
Triglycerides: 76 mg/dL (ref 0.0–149.0)
VLDL: 15.2 mg/dL (ref 0.0–40.0)

## 2017-12-28 NOTE — Telephone Encounter (Signed)
-----   Message from Wendi MayaLauren Greeson, RT sent at 12/19/2017  4:49 PM EDT ----- Regarding: Lab orders for Thursday 12/28/17 Please enter lab orders for 12/28/17. No upcoming appt scheduled. Thanks-Lauren

## 2017-12-29 ENCOUNTER — Ambulatory Visit: Payer: Self-pay | Admitting: Family Medicine

## 2017-12-29 NOTE — Telephone Encounter (Signed)
Spoke to pt. Made an appt with Dr Ermalene SearingBedsole on 01-04-18. She said she is going to hold off on the neuro appt right now. Thinks it is something in the seafood she ate. Seems to be happening every time she eats it.

## 2017-12-29 NOTE — Telephone Encounter (Signed)
Spoke to pt who states she is no longer experiencing the numbness in her face, and her finger has been "jammed" for 2days. I advised her that based on her s/s she would need to go to the ED, but she was persistent that she "could not afford it." She states that she is going to contact her neurologist and attempt to be worked in. If unable to be seen, she will contact [facility] back and advise how she intends to proceed.

## 2017-12-29 NOTE — Telephone Encounter (Signed)
Called in c/o tingling in the left side of her face that has occurred the last couple of mornings.   Denies eye or mouth drooping.   Speech is clear and appropriate.    The tingling goes away after I've been up for 30-45 minutes.   "I think it's the way I slept".  She also c/o left little pinky finger "hanging out there".    "I can't bend it inward".   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   This started happening yesterday.    [facility].  It was recommended she go to the ED.   (There are no openings there today). Plus her symptoms.  I let the pt know she needed to go to the ED at the recommendation of Dr. Daphine DeutscherBedsole's nurse.   She replied,  "I'm not going to the emergency room".    "I can't afford that".    "The tingling in my face goes away after I've been up about 30 minutes".   "It's just the way I slept".    I let her know her symptoms could be an indication of something more serious and we just want to make sure she is ok.    She said she will call her heart doctor, Dr. Dorethea ClanHardin and see if he can see her today.   I instructed her to call back or go to the ED if she could not get an appt with her heart doctor.    She verbalized understanding of these instructions.  I routed a note to Dr. Ermalene SearingBedsole making her aware pt's decision not to go to ED so she would be aware.    Reason for Disposition . [1] Numbness (i.e., loss of sensation) of the face, arm / hand, or leg / foot on one side of the body AND [2] sudden onset AND [3] brief (now gone)  Answer Assessment - Initial Assessment Questions 1. SYMPTOM: "What is the main symptom you are concerned about?" (e.g., weakness, numbness)     My left little pinky I can't pull it inward.   I'm having tingling in left side of face.  I've had this the last couple of mornings.   If I bite down on that side I would not feel it.   I don't feel it when I pinch my left side of my face.  Denies  drooping of mouth or eye. 2. ONSET: "When did this start?" (minutes, hours, days; while sleeping)     Pinky started yesterday.   I can't pull my finger in.   I can't fold my little finger in.   "It's just hanging out there".      The tingling in side of my face has been there the last couple of morning but then it goes away.   It goes away after 30-45 minutes.    3. LAST NORMAL: "When was the last time you were normal (no symptoms)?"     Pinky finger yesterday.   The tingling in my face has been going on for the last couple of days. 4. PATTERN "Does this come and go, or has it been constant since it started?"  "Is it present now?"     It goes away after I've been up 30-45 minutes.   My face feels better since I've been talking to you.   "I think it's the way I slept on it". 5. CARDIAC  SYMPTOMS: "Have you had any of the following symptoms: chest pain, difficulty breathing, palpitations?"     I have mitral valve prolapse.   No shortness of breath or chest discomfort. 6. NEUROLOGIC SYMPTOMS: "Have you had any of the following symptoms: headache, dizziness, vision loss, double vision, changes in speech, unsteady on your feet?"     No headache, dizziness or vision change.   Not unsteady per pt. 7. OTHER SYMPTOMS: "Do you have any other symptoms?"     No other symptoms.   Asking if it's carpel tunnel.   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   There's no pain in left hand.    8. PREGNANCY: "Is there any chance you are pregnant?" "When was your last menstrual period?"     Not asked due to age  Protocols used: NEUROLOGIC DEFICIT-A-AH

## 2017-12-29 NOTE — Telephone Encounter (Signed)
Have her make an appt for eval of finger next week.  Agree with appt with neuro for tingling in face.

## 2018-01-04 ENCOUNTER — Ambulatory Visit (INDEPENDENT_AMBULATORY_CARE_PROVIDER_SITE_OTHER)
Admission: RE | Admit: 2018-01-04 | Discharge: 2018-01-04 | Disposition: A | Discharge status: Home / Self Care | Payer: Medicare Other | Source: Ambulatory Visit | Attending: Family Medicine | Admitting: Family Medicine

## 2018-01-04 ENCOUNTER — Encounter: Payer: Self-pay | Admitting: Family Medicine

## 2018-01-04 ENCOUNTER — Ambulatory Visit (INDEPENDENT_AMBULATORY_CARE_PROVIDER_SITE_OTHER): Payer: Medicare Other | Admitting: Family Medicine

## 2018-01-04 DIAGNOSIS — S6990XA Unspecified injury of unspecified wrist, hand and finger(s), initial encounter: Secondary | ICD-10-CM | POA: Diagnosis not present

## 2018-01-04 DIAGNOSIS — M79645 Pain in left finger(s): Secondary | ICD-10-CM | POA: Diagnosis not present

## 2018-01-04 NOTE — Assessment & Plan Note (Signed)
Most consistent with flexor dig injury ( [place] finger) no clear cause. Will eval for avulsion fracture and refer to hand specialist for possible repair.

## 2018-01-04 NOTE — Progress Notes (Signed)
   Subjective:    Patient ID: Tiffany [name], female    DOB: 17-Apr-1946, 72 y.o.   MRN: [id number]  HPI    72 year old female presents with new onset pain in left pinky x 7 days No known injury/fall. She cannot band right pinky much, no redness, pain in right lateral onkiy. No swelling no redness. No past hand surgery.  Social History /Family History/Past Medical History reviewed in detail and updated in EMR if needed. Blood pressure 126/76, pulse 84, temperature 98.5 F (36.9 C), temperature source Oral, height 5' 3.5" (1.613 m), weight 127 lb (57.6 kg), SpO2 96 %. .    Review of Systems  Constitutional: Negative for fatigue and fever.  HENT: Negative for congestion.   Eyes: Negative for pain.  Respiratory: Negative for cough and shortness of breath.   Cardiovascular: Negative for chest pain, palpitations and leg swelling.  Gastrointestinal: Negative for abdominal pain.  Genitourinary: Negative for dysuria and vaginal bleeding.  Musculoskeletal: Negative for back pain.  Neurological: Negative for syncope, light-headedness and headaches.  Psychiatric/Behavioral: Negative for dysphoric mood.       Objective:   Physical Exam  Constitutional: Vital signs are normal. She appears well-developed and well-nourished. She is cooperative.  Non-toxic appearance. She does not appear ill. No distress.  HENT:  Head: Normocephalic.  Right Ear: Hearing, tympanic membrane, external ear and ear canal normal. Tympanic membrane is not erythematous, not retracted and not bulging.  Left Ear: Hearing, tympanic membrane, external ear and ear canal normal. Tympanic membrane is not erythematous, not retracted and not bulging.  Nose: No mucosal edema or rhinorrhea. Right sinus exhibits no maxillary sinus tenderness and no frontal sinus tenderness. Left sinus exhibits no maxillary sinus tenderness and no frontal sinus tenderness.  Mouth/Throat: Uvula is midline, oropharynx is clear and moist and  mucous membranes are normal.  Eyes: Pupils are equal, round, and reactive to light. Conjunctivae, EOM and lids are normal. Lids are everted and swept, no foreign bodies found.  Neck: Trachea normal and normal range of motion. Neck supple. Carotid bruit is not present. No thyroid mass and no thyromegaly present.  Cardiovascular: Normal rate, regular rhythm, S1 normal, S2 normal, normal heart sounds, intact distal pulses and normal pulses. Exam reveals no gallop and no friction rub.  No murmur heard. Pulmonary/Chest: Effort normal and breath sounds normal. No tachypnea. No respiratory distress. She has no decreased breath sounds. She has no wheezes. She has no rhonchi. She has no rales.  Abdominal: Soft. Normal appearance and bowel sounds are normal. There is no tenderness.  Musculoskeletal:       Left wrist: Normal.       Left hand: Normal sensation noted. Decreased strength noted.   Unable to flex distal phalanx of left 5th digit, other strength intact.  Neurological: She is alert.  Skin: Skin is warm, dry and intact. No rash noted.  Psychiatric: Her speech is normal and behavior is normal. Judgment and thought content normal. Her mood appears not anxious. Cognition and memory are normal. She does not exhibit a depressed mood.          Assessment & Plan:

## 2018-01-04 NOTE — Patient Instructions (Signed)
Please stop at the front desk to set up referral.  

## 2018-01-11 ENCOUNTER — Encounter: Payer: Self-pay | Admitting: Cardiology

## 2018-01-11 ENCOUNTER — Ambulatory Visit: Payer: Medicare Other | Admitting: Cardiology

## 2018-01-11 VITALS — BP 127/72 | HR 63 | Ht 63.0 in | Wt 130.0 lb

## 2018-01-11 DIAGNOSIS — I493 Ventricular premature depolarization: Secondary | ICD-10-CM | POA: Diagnosis not present

## 2018-01-11 DIAGNOSIS — R002 Palpitations: Secondary | ICD-10-CM

## 2018-01-11 DIAGNOSIS — I059 Rheumatic mitral valve disease, unspecified: Secondary | ICD-10-CM

## 2018-01-11 DIAGNOSIS — I10 Essential (primary) hypertension: Secondary | ICD-10-CM | POA: Diagnosis not present

## 2018-01-11 NOTE — Progress Notes (Signed)
PCP: [name], MD  Clinic Note: Chief Complaint  Patient presents with  . Follow-up    Doing well.  No major symptoms  . Palpitations    Not really bothering her    HPI: Tiffany [name] is a 72 y.o. female with a PMH below who presents today for Annual follow-up for palpitations..  She is a former patient of Dr. Terance Ice, whom he was following for her palpitations and a reported history of mitral valve prolapse from the cardiac catheterization in 2002.[name]  MVP was not confirmed by is several echocardiograms did not suggest the presence of mitral prolapse and only had mild MR.[name]  [name] was last seen in March 2018. - doing well. No complaints.  Recent Hospitalizations: none  Studies Reviewed: No new studies  Interval History: Tiffany [name] presents today doing fairly well.  She has had some issues with her left hand where she had some numbness and tingling sensations down left arm limited her movement of her left fifth finger.  She does not really have any pain down the left arm just some numbness.  But otherwise she is doing fine she is not having any significant symptoms of palpitations or irregular heartbeats. She has not had any recurrent illnesses such as flulike illnesses that she had last saw her.  She has been doing fine with no resting or exertional dyspnea.  No chest pain or pressure with rest or exertion.  No syncope/near syncope or TIA/amaurosis fugax symptoms. No claudication  ROS: A comprehensive was performed. Review of Systems  Constitutional: Negative for malaise/fatigue.  HENT: Negative for congestion.   Respiratory: Positive for cough. Negative for sputum production and wheezing.   Musculoskeletal: Positive for neck pain.       Pain down left arm to left hand and fifth digit  Neurological: Positive for tingling (Down left arm) and headaches. Negative for dizziness.  Psychiatric/Behavioral: Negative.   All other systems reviewed and are  negative.   Past Medical History:  Diagnosis Date  . Anxiety and depression   . Arthritis    knee  . Dupuytren's contracture of hand   . Esophageal spasm   . GERD (gastroesophageal reflux disease)   . Heart murmur   . History of MRI of cervical spine 09/98   Dr. Rolin Barry  . History of MRI of lumbar spine 10/1998   Dr. Rolin Barry  . Hyperlipemia   . Hypertension   . Osteoporosis 07/15/1999  . Plantar fasciitis, bilateral     Past Surgical History:  Procedure Laterality Date  . 2 D Echo  08/1999~04/13/2006   Mild MVP, MILD MR, Mild aortic sclerosis  . Abd ultrasound  03/02/1999   NML, no gallstones  . CARDIAC CATHETERIZATION  01/2001   Nonobstructive CAD  . ESOPHAGOGASTRODUODENOSCOPY     Sliding H. H. ~ 07/1995-11/2003 Neg  . ESOPHAGOGASTRODUODENOSCOPY N/A 08/10/2012   Procedure: ESOPHAGOGASTRODUODENOSCOPY (EGD);  Surgeon: Lafayette Dragon, MD;  Location: [facility];  Service: Endoscopy;  Laterality: N/A;  . KNEE SURGERY    . [place]  . NM LEXISCAN MYOVIEW LTD  03/26/2012   LOW RISK; no ischemia or infarction. Gut attenuation.  [name] SAVORY DILATION N/A 08/10/2012   Procedure: SAVORY DILATION;  Surgeon: Lafayette Dragon, MD;  Location: [facility];  Service: Endoscopy;  Laterality: N/A;  . SPIROMETRY  06/2004   NML  . TONSILLECTOMY AND ADENOIDECTOMY  as a child  . TRANSTHORACIC ECHOCARDIOGRAM  02/11/2011   Normal LV  Size & function; ef ~53-66%; grade 1 diastolic dysfunction. MILD MAC no mitral stenosis and trace mitral regurgitation, no comment of prolapse  . TUBAL LIGATION  1975   benign tumor    Current Meds  Medication Sig  . ALPRAZolam (XANAX) 0.5 MG tablet Take 0.5 mg by mouth 3 (three) times daily as needed for sleep or anxiety.  . bisacodyl (BISACODYL) 5 MG EC tablet Take 1 tablet (5 mg total) by mouth daily as needed for moderate constipation.  . methocarbamol (ROBAXIN-750) 750 MG tablet Take 750 mg by mouth 3 (three) times daily.    . metoprolol tartrate  (LOPRESSOR) 50 MG tablet TAKE ONE (1) TABLET BY MOUTH TWO (2) TIMES DAILY  . nortriptyline (PAMELOR) 25 MG capsule TAKE ONE CAPSULE BY MOUTH EVERY NIGHT ATBEDTIME ALONG WITH 50MG CAPSULE  . nortriptyline (PAMELOR) 50 MG capsule TAKE ONE CAPSULE BY MOUTH EVERY NIGHT ATBEDTIME WITH 25MG CAPSULE  . pantoprazole (PROTONIX) 40 MG tablet TAKE ONE (1) TABLET BY MOUTH EVERY DAY    Allergies  Allergen Reactions  . Aspirin Other (See Comments)    REACTION: SWELLING  . Latex Anaphylaxis    REACTION: HIVES  . Codeine     REACTION: SWELLING  . Fexofenadine     REACTION: VOMITING  . Nsaids     REACTION: SWELLING  . Tramadol     REACTION: ITCH  . Zolpidem Tartrate     REACTION: unknown   Social History   Tobacco Use  . Smoking status: Never Smoker  . Smokeless tobacco: Never Used  Substance Use Topics  . Alcohol use: No    Alcohol/week: 0.0 oz  . Drug use: No   Social History   Social History Narrative   72 year old, white woman, married mother of 3 with only one child living. She has 3 grandchildren. Her son that had spina bifida died at age 93 08/16/09).   Never smoked. Does not drink alcohol.     family history includes Breast cancer in her mother; Throat cancer in her father.  Wt Readings from Last 3 Encounters:  01/11/18 130 lb (59 kg)  01/04/18 127 lb (57.6 kg)  09/28/17 127 lb (57.6 kg)    PHYSICAL EXAM BP 127/72   Pulse 63   Ht _0  (1.6 m)   Wt 130 lb (59 kg)   BMI 23.03 kg/m   Physical Exam  Constitutional: She is oriented to person, place, and time. She appears well-developed and well-nourished. No distress.  Healthy-appearing  HENT:  Head: Normocephalic and atraumatic.  Neck: Normal range of motion. Neck supple. No hepatojugular reflux and no JVD present. Carotid bruit is not present.  Cardiovascular: Normal rate, regular rhythm, normal heart sounds and intact distal pulses.  No extrasystoles are present. PMI is not displaced. Exam reveals no gallop and no  friction rub.  No murmur heard. Pulmonary/Chest: Effort normal and breath sounds normal. No respiratory distress. She has no wheezes. She has no rales.  Nonlabored  Abdominal: Soft. Bowel sounds are normal. She exhibits no distension. There is no tenderness. There is no rebound.  no HSM  Musculoskeletal: Normal range of motion. She exhibits no edema.  she is unable to bend her left fifth digit by herself.  It moves freely with passive motion, but she is unable to control it.  The rest of the hand is fine.  Neurological: She is alert and oriented to person, place, and time.  Psychiatric: She has a normal mood and affect. Her behavior is normal.  Judgment and thought content normal.  Vitals reviewed.   Adult ECG Report  Rate: 63 ;  Rhythm: normal sinus rhythm and Inc RBBB, CRO AMI with poor R wave progression.  OTW borderline voltage, but normal axis, intervals & duration; No PVCs  Narrative Interpretation: stable   Other studies Reviewed: Additional studies/ records that were reviewed today include:  Recent Labs:  Checked by PCP. - lipid level worse -- > planning diet change; not ready for medications.   ASSESSMENT / PLAN: Problem List Items Addressed This Visit    Essential hypertension, benign (Chronic)    Well-controlled on current meds.  No change      Frequent unifocal PVCs - Primary (Chronic)    Not overly symptomatically seems to be well-controlled with current dose of Lopressor.      Heart palpitations (Chronic)    Well-controlled with current dose of beta-blocker.      Relevant Orders   EKG 12-Lead (Completed)   Mitral valve disorder (Chronic)    Only mild mitral valve prolapse noted on most recent echocardiogram.  Some redundant mitral valve tissue but no mitral regurgitation.  Not significant enough to explain significant palpitations.  No indication for prophylactic antibiotics.         Current medicines are reviewed at length with the patient today. (+/-  concerns) n/a The following changes have been made: n/a  Patient Instructions  Your physician wants you to follow-up in: [place] will receive a reminder letter in the mail two months in advance. If you don't receive a letter, please call our office to schedule the follow-up appointment.   If you need a refill on your cardiac medications before your next appointment, please call your pharmacy.    Studies Ordered:   Orders Placed This Encounter  Procedures  . EKG 12-Lead      Glenetta Hew, M.D., M.S. Interventional Cardiologist   Pager # [phone] Phone # [phone] [address] [place], [place] [address]

## 2018-01-11 NOTE — Patient Instructions (Signed)
Your physician wants you to follow-up in: ONE YEAR WITH DR HARDING You will receive a reminder letter in the mail two months in advance. If you don't receive a letter, please call our office to schedule the follow-up appointment.   If you need a refill on your cardiac medications before your next appointment, please call your pharmacy.  

## 2018-01-13 ENCOUNTER — Encounter: Payer: Self-pay | Admitting: Cardiology

## 2018-01-13 NOTE — Assessment & Plan Note (Signed)
Well-controlled with current dose of beta-blocker. 

## 2018-01-13 NOTE — Assessment & Plan Note (Addendum)
Only mild mitral valve prolapse noted on most recent echocardiogram.  Some redundant mitral valve tissue but no mitral regurgitation.  Not significant enough to explain significant palpitations.  No indication for prophylactic antibiotics.

## 2018-01-13 NOTE — Assessment & Plan Note (Signed)
Well-controlled on current meds.  No change 

## 2018-01-13 NOTE — Assessment & Plan Note (Signed)
Not overly symptomatically seems to be well-controlled with current dose of Lopressor.

## 2018-01-18 DIAGNOSIS — G5622 Lesion of ulnar nerve, left upper limb: Secondary | ICD-10-CM | POA: Diagnosis not present

## 2018-02-19 DIAGNOSIS — Z79899 Other long term (current) drug therapy: Secondary | ICD-10-CM | POA: Diagnosis not present

## 2018-02-19 DIAGNOSIS — G894 Chronic pain syndrome: Secondary | ICD-10-CM | POA: Diagnosis not present

## 2018-02-19 DIAGNOSIS — M4608 Spinal enthesopathy, sacral and sacrococcygeal region: Secondary | ICD-10-CM | POA: Diagnosis not present

## 2018-03-19 ENCOUNTER — Other Ambulatory Visit: Payer: Self-pay | Admitting: Family Medicine

## 2018-03-19 NOTE — Telephone Encounter (Signed)
Last office visit 01/04/2018 for finger injury. CPE scheduled for 09/28/2018  Last refilled 09/20/2017 for #90 with 1 refill.

## 2018-03-29 ENCOUNTER — Ambulatory Visit: Payer: Medicare Other

## 2018-04-05 ENCOUNTER — Ambulatory Visit (INDEPENDENT_AMBULATORY_CARE_PROVIDER_SITE_OTHER): Payer: Medicare Other

## 2018-04-05 DIAGNOSIS — Z23 Encounter for immunization: Secondary | ICD-10-CM | POA: Diagnosis not present

## 2018-04-20 ENCOUNTER — Other Ambulatory Visit: Payer: Self-pay | Admitting: Family Medicine

## 2018-05-14 DIAGNOSIS — M4608 Spinal enthesopathy, sacral and sacrococcygeal region: Secondary | ICD-10-CM | POA: Diagnosis not present

## 2018-05-14 DIAGNOSIS — Z79899 Other long term (current) drug therapy: Secondary | ICD-10-CM | POA: Diagnosis not present

## 2018-05-14 DIAGNOSIS — G894 Chronic pain syndrome: Secondary | ICD-10-CM | POA: Diagnosis not present

## 2018-08-03 ENCOUNTER — Telehealth: Payer: Self-pay | Admitting: Family Medicine

## 2018-08-03 NOTE — Telephone Encounter (Signed)
Spoke with Mrs. Masser.  She is taking the alprazolam three times a day.  Patient advised that Dr. Ermalene Searing would call her back later this evening to discuss options.

## 2018-08-03 NOTE — Telephone Encounter (Signed)
I am so sorry to hear that.   We could try to increase her nortriptyline for sleep.  Or how often is she taking the alprazolam? TID?

## 2018-08-03 NOTE — Telephone Encounter (Signed)
Pt stated she is waking up between 3am and 4am and can't go back to sleep. Pt stated she is there with her dying husband and it is getting to her. Pt wish to speak to nurse for advise.

## 2018-08-07 MED ORDER — NORTRIPTYLINE HCL 50 MG PO CAPS: 100.0000 mg | ORAL_CAPSULE | Freq: Every day | ORAL | 0 refills | Status: DC

## 2018-08-07 NOTE — Telephone Encounter (Addendum)
Mrs. Lattanzi notified as instructed by telephone.  She states the nortriptyline was working so she will just increase to 100 mg total at bedtime.  Refills sent with new dosing instructions to [facility].

## 2018-08-07 NOTE — Addendum Note (Signed)
Addended by: Damita Lack on: 08/07/2018 10:37 AM   Modules accepted: Orders

## 2018-08-07 NOTE — Telephone Encounter (Signed)
I was unable to reach [name] on Friday. Please call in a sleep medicaiton  called trazodone. She can use this as long as she is NOT taking the nortriptyline. Trazodone 50 mg po qhs 1 tab at bedtime as needed for insomnia. # 30, 1 RF.   If she is taking the nortriptyline.. she can increase to 100 mg total at bedtime. Okay to call in 3 refills if needed  She can continue to use the alprazolam during the day for anxiety.

## 2018-08-13 DIAGNOSIS — G894 Chronic pain syndrome: Secondary | ICD-10-CM | POA: Diagnosis not present

## 2018-08-13 DIAGNOSIS — Z79899 Other long term (current) drug therapy: Secondary | ICD-10-CM | POA: Diagnosis not present

## 2018-08-13 DIAGNOSIS — M4608 Spinal enthesopathy, sacral and sacrococcygeal region: Secondary | ICD-10-CM | POA: Diagnosis not present

## 2018-09-10 DIAGNOSIS — S139XXA Sprain of joints and ligaments of unspecified parts of neck, initial encounter: Secondary | ICD-10-CM | POA: Diagnosis not present

## 2018-09-11 DIAGNOSIS — M791 Myalgia, unspecified site: Secondary | ICD-10-CM | POA: Diagnosis not present

## 2018-09-11 DIAGNOSIS — M542 Cervicalgia: Secondary | ICD-10-CM | POA: Diagnosis not present

## 2018-09-25 ENCOUNTER — Ambulatory Visit: Payer: Medicare Other

## 2018-09-27 DIAGNOSIS — M791 Myalgia, unspecified site: Secondary | ICD-10-CM | POA: Diagnosis not present

## 2018-09-27 DIAGNOSIS — M47812 Spondylosis without myelopathy or radiculopathy, cervical region: Secondary | ICD-10-CM | POA: Diagnosis not present

## 2018-09-27 DIAGNOSIS — M542 Cervicalgia: Secondary | ICD-10-CM | POA: Diagnosis not present

## 2018-09-28 ENCOUNTER — Encounter: Payer: Medicare Other | Admitting: Family Medicine

## 2018-10-08 DIAGNOSIS — M542 Cervicalgia: Secondary | ICD-10-CM | POA: Diagnosis not present

## 2018-10-08 DIAGNOSIS — M791 Myalgia, unspecified site: Secondary | ICD-10-CM | POA: Diagnosis not present

## 2018-10-08 DIAGNOSIS — M47812 Spondylosis without myelopathy or radiculopathy, cervical region: Secondary | ICD-10-CM | POA: Diagnosis not present

## 2018-10-16 ENCOUNTER — Ambulatory Visit (INDEPENDENT_AMBULATORY_CARE_PROVIDER_SITE_OTHER): Payer: Medicare Other | Admitting: Family Medicine

## 2018-10-16 ENCOUNTER — Encounter: Payer: Self-pay | Admitting: Family Medicine

## 2018-10-16 ENCOUNTER — Other Ambulatory Visit: Payer: Self-pay

## 2018-10-16 VITALS — BP 110/57 | HR 109 | Temp 98.0°F | Ht 63.5 in | Wt 120.0 lb

## 2018-10-16 DIAGNOSIS — R05 Cough: Secondary | ICD-10-CM

## 2018-10-16 DIAGNOSIS — R059 Cough, unspecified: Secondary | ICD-10-CM

## 2018-10-16 NOTE — Progress Notes (Signed)
VIRTUAL VISIT Due to national recommendations of social distancing due to COVID 19, a virtual visit is felt to be most appropriate for this patient at this time.   I connected with the patient on 10/16/18 at 11:20 AM EDT by virtual telehealth platform and verified that I am speaking with the correct person using two identifiers.   Interactive audio and video telecommunications were attempted between this provider and patient, however failed, due to patient having technical difficulties OR patient did not have access to video capability.  We continued and completed visit with audio only.    I discussed the limitations, risks, security and privacy concerns of performing an evaluation and management service by  virtual telehealth platform and the availability of in person appointments. I also discussed with the patient that there may be a patient responsible charge related to this service. The patient expressed understanding and agreed to proceed.  Patient location: Home Provider [facility] Participants: Tiffany NoraAmy Riku [name] and Tiffany RavelPatricia P [name]   Chief Complaint  Patient presents with  . Cough    with thick green phelgm since Saturday  . Sore Throat    burns with coughing    History of Present Illness: Cough  This is a new problem. The current episode started in the past 7 days (3 days). The problem has been gradually worsening. The problem occurs constantly. The cough is productive of sputum. Associated symptoms include headaches, nasal congestion, postnasal drip and a sore throat. Pertinent negatives include no chest pain, chills, ear congestion, ear pain, fever, rash, shortness of breath or wheezing. Associated symptoms comments: Sneeze  tounge is sore, no cahnge in taste or smell No emesis, or diarrhea  no sinus pain. The symptoms are aggravated by lying down. Risk factors: nonsmoker, no sick contacts. Treatments tried: mucinex, tussin cough syrup and zyrtec. The treatment  provided moderate relief. Her past medical history is significant for environmental allergies. There is no history of asthma, bronchiectasis, COPD or emphysema.  Sore Throat   Associated symptoms include coughing and headaches. Pertinent negatives include no ear pain or shortness of breath.   Usually zyrtec helps with allergies  HR up slightly... not on decongestant  COVID 19 screen No recent travel or known exposure to COVID19. Staying at home.  The importance of social distancing was discussed today.   Review of Systems  Constitutional: Negative for chills and fever.  HENT: Positive for postnasal drip and sore throat. Negative for ear pain.   Respiratory: Positive for cough. Negative for shortness of breath and wheezing.   Cardiovascular: Negative for chest pain.  Skin: Negative for rash.  Neurological: Positive for headaches.  Endo/Heme/Allergies: Positive for environmental allergies.      Past Medical History:  Diagnosis Date  . Anxiety and depression   . Arthritis    knee  . Dupuytren's contracture of hand   . Esophageal spasm   . GERD (gastroesophageal reflux disease)   . Heart murmur   . History of MRI of cervical spine 09/98   Dr. Elesa Hackereaton  . History of MRI of lumbar spine 10/1998   Dr. Elesa Hackereaton  . Hyperlipemia   . Hypertension   . Osteoporosis 07/15/1999  . Plantar fasciitis, bilateral     reports that she has never smoked. She has never used smokeless tobacco. She reports that she does not drink alcohol or use drugs.   Current Outpatient Medications:  .  ALPRAZolam (XANAX) 0.5 MG tablet, Take 0.5 mg by mouth 3 (three) times daily  as needed for sleep or anxiety., Disp: , Rfl:  .  bisacodyl (BISACODYL) 5 MG EC tablet, Take 1 tablet (5 mg total) by mouth daily as needed for moderate constipation., Disp: 30 tablet, Rfl: 0 .  methocarbamol (ROBAXIN-750) 750 MG tablet, Take 750 mg by mouth 3 (three) times daily.  , Disp: , Rfl:  .  metoprolol tartrate (LOPRESSOR) 50 MG  tablet, TAKE ONE (1) TABLET BY MOUTH TWO (2) TIMES DAILY, Disp: 180 tablet, Rfl: 5 .  nortriptyline (PAMELOR) 50 MG capsule, Take 2 capsules (100 mg total) by mouth at bedtime., Disp: 180 capsule, Rfl: 0 .  pantoprazole (PROTONIX) 40 MG tablet, TAKE ONE (1) TABLET BY MOUTH EVERY DAY, Disp: 90 tablet, Rfl: 1   Observations/Objective: Blood pressure (!) 110/57, pulse (!) 109, temperature 98 F (36.7 C), temperature source Oral, height 5' 3.5" (1.613 m), weight 120 lb (54.4 kg).  Physical Exam  Physical Exam Constitutional:      General: The patient is not in acute distress. Speaking in complete sentences. Pulmonary:     Effort: Pulmonary effort is normal. No respiratory distress.  Neurological:     Mental Status: The patient is alert and oriented to person, place, and time.  Psychiatric:        Mood and Affect: Mood normal.        Behavior: Behavior normal.  Assessment and Plan Cough Likely viral URI, symptomatic care.  Possible COVID 19 but not severe symptoms.[name] test oir ER visit not indicated. Will place pt on respiratory symptom call back list to get update.  If not turning corner in 5-7 days will consider bacterial infection etiology.     I discussed the assessment and treatment plan with the patient. The patient was provided an opportunity to ask questions and all were answered. The patient agreed with the plan and demonstrated an understanding of the instructions.   The patient was advised to call back or seek an in-person evaluation if the symptoms worsen or if the condition fails to improve as anticipated.     Tiffany Nora, MD

## 2018-10-16 NOTE — Patient Instructions (Signed)
Rest, fluids.  Mucinex to break up mucus and tussin cough suppressant at night.  If not improving in 5-7 days consider bacterial source of infeciton.  If severe shortness of breath .. got to ER.

## 2018-10-16 NOTE — Assessment & Plan Note (Signed)
Likely viral URI, symptomatic care.  Possible COVID 19 but not severe symptoms.[name] test oir ER visit not indicated. Will place pt on respiratory symptom call back list to get update.  If not turning corner in 5-7 days will consider bacterial infection etiology.

## 2018-10-16 NOTE — Progress Notes (Signed)
Patient added to respiratory list.

## 2018-10-19 ENCOUNTER — Telehealth: Payer: Self-pay | Admitting: *Deleted

## 2018-10-19 MED ORDER — AMOXICILLIN 500 MG PO CAPS: 1000.0000 mg | ORAL_CAPSULE | Freq: Two times a day (BID) | ORAL | 0 refills | Status: DC

## 2018-10-19 NOTE — Telephone Encounter (Signed)
Called to check on Tiffany [name].  She is on the respiratory illness log.  She states she is still coughing up green phlegm.  Cough is worse at night.  Patient states she has had some rough nights.  She states she still have no fever or SOB, but does complain of headache.

## 2018-10-19 NOTE — Telephone Encounter (Signed)
Let pt know that given  She is not improving with productive purulent sputum... will treat with and antibiotic.

## 2018-10-19 NOTE — Telephone Encounter (Signed)
Mrs. Dub notified as instructed by telephone.

## 2018-10-22 ENCOUNTER — Telehealth: Payer: Self-pay | Admitting: *Deleted

## 2018-10-22 NOTE — Telephone Encounter (Signed)
Sorry.. typo.[name] OFF

## 2018-10-22 NOTE — Telephone Encounter (Signed)
Called to check on Tiffany [name] who is on my Respiratory Illness log.  Dr. Ermalene Searing started her on amoxicillin 10/19/2018.  Patient states she is doing better.  She is no longer coughing up green phlegm.   She states still still has some coughing in the afternoon but thinks that is probably from the pollen from sitting outside.  She denies any fever of SOB.  I reminded patient is make sure she completes the full course of antibiotics.  Patient states understanding.

## 2018-10-22 NOTE — Telephone Encounter (Signed)
Noted. Take on respiratory call list.

## 2018-10-22 NOTE — Telephone Encounter (Signed)
Mrs. Resch removed from Respiratory Illness log as instructed by Dr. Ermalene Searing.

## 2018-10-22 NOTE — Telephone Encounter (Signed)
Keep on or take off?

## 2018-11-12 DIAGNOSIS — M4608 Spinal enthesopathy, sacral and sacrococcygeal region: Secondary | ICD-10-CM | POA: Diagnosis not present

## 2018-11-12 DIAGNOSIS — Z79899 Other long term (current) drug therapy: Secondary | ICD-10-CM | POA: Diagnosis not present

## 2018-11-12 DIAGNOSIS — G894 Chronic pain syndrome: Secondary | ICD-10-CM | POA: Diagnosis not present

## 2018-11-28 DIAGNOSIS — S63501A Unspecified sprain of right wrist, initial encounter: Secondary | ICD-10-CM | POA: Diagnosis not present

## 2018-11-28 DIAGNOSIS — M47818 Spondylosis without myelopathy or radiculopathy, sacral and sacrococcygeal region: Secondary | ICD-10-CM | POA: Diagnosis not present

## 2018-12-03 ENCOUNTER — Other Ambulatory Visit: Payer: Self-pay | Admitting: Family Medicine

## 2018-12-03 NOTE — Telephone Encounter (Signed)
Last office visit 10/16/2018 for cough.  Last refilled 08/07/2018 for #180 with no refills.  CPE scheduled for 02/19/2019.

## 2019-01-10 ENCOUNTER — Telehealth: Payer: Self-pay | Admitting: Cardiology

## 2019-01-10 NOTE — Telephone Encounter (Signed)
New message   Pt c/o Shortness Of Breath: STAT if SOB developed within the last 24 hours or pt is noticeably SOB on the phone  1. Are you currently SOB (can you hear that pt is SOB on the phone)?no   2. How long have you been experiencing SOB? 2 weeks   3. Are you SOB when sitting or when up moving around? both  4. Are you currently experiencing any other symptoms? No

## 2019-01-10 NOTE — Telephone Encounter (Signed)
Returned call to patient no answer.LMTC. 

## 2019-01-14 NOTE — Telephone Encounter (Signed)
Tried to call-phone just rings and then hangs up

## 2019-01-15 NOTE — Telephone Encounter (Signed)
Called patient, she states that she has had increased SOB for the past few weeks and that is worsening, she has noticed that her heart is beating out of her chest at times, and he HR is faster than normal. Unable to give me numbers at this time. She notices that SOB when she is moving around, and even when sitting down. Patient is over due for appointment for yearly and with increased symptoms I suggested that patient be seen. Patient gave appointment for next week for Jory Sims, NP for next week. Advised patient to call back if issues get worse before then.

## 2019-01-21 NOTE — Progress Notes (Signed)
Cardiology Office Note   Date:  01/21/2019   ID:  Tiffany [name], Tiffany [name] [birth year], MRN [id number]  PCP:  [name], MD  Cardiologist:  Dr. Ellyn Hack  CC:  Palpitations    History of Present Illness: Tiffany [name] is a 73 y.o. female who presents for ongoing assessment and management of palpitations.  She was formally followed by Dr. Rollene Fare.  Other history includes mitral valve prolapse noted on catheterization in 2002.  She was last seen by Dr. Ellyn Hack on 01/11/2018 and did not have any increase in symptoms of palpitations at that time.  She was continued on low-dose Lopressor.  No further testing was planned at that time.  The patient's echocardiogram revealed mild mitral valve prolapse some redundant mitral valve tissue but no mitral regurg.  She comes today with complaints of more frequent palpitations.  She states that her husband died 3 weeks ago, on December 29, 2018, from small cell lung cancer.  She has been under a lot of stress over the last several months concerning his disease process and eventual death.  She is quite emotional today as she describes it.  She states that she has always had the palpitations and has been taking her metoprolol as directed, but lately her palpitations have worsened.  She denies dizziness chest pain or dyspnea however she does have generalized fatigue.  She believes it is related to all of the stress.  Past Medical History:  Diagnosis Date  . Anxiety and depression   . Arthritis    knee  . Dupuytren's contracture of hand   . Esophageal spasm   . GERD (gastroesophageal reflux disease)   . Heart murmur   . History of MRI of cervical spine 09/98   Dr. Rolin Barry  . History of MRI of lumbar spine 10/1998   Dr. Rolin Barry  . Hyperlipemia   . Hypertension   . Osteoporosis 07/15/1999  . Plantar fasciitis, bilateral     Past Surgical History:  Procedure Laterality Date  . 2 D Echo  08/1999~04/13/2006   Mild MVP, MILD MR, Mild aortic sclerosis  .  Abd ultrasound  03/02/1999   NML, no gallstones  . CARDIAC CATHETERIZATION  01/2001   Nonobstructive CAD  . ESOPHAGOGASTRODUODENOSCOPY     Sliding H. H. ~ 07/1995-11/2003 Neg  . ESOPHAGOGASTRODUODENOSCOPY N/A 08/10/2012   Procedure: ESOPHAGOGASTRODUODENOSCOPY (EGD);  Surgeon: Lafayette Dragon, MD;  Location: [facility];  Service: Endoscopy;  Laterality: N/A;  . KNEE SURGERY    . [place]  . NM LEXISCAN MYOVIEW LTD  03/26/2012   LOW RISK; no ischemia or infarction. Gut attenuation.  [name] SAVORY DILATION N/A 08/10/2012   Procedure: SAVORY DILATION;  Surgeon: Lafayette Dragon, MD;  Location: [facility];  Service: Endoscopy;  Laterality: N/A;  . SPIROMETRY  06/2004   NML  . TONSILLECTOMY AND ADENOIDECTOMY  as a child  . TRANSTHORACIC ECHOCARDIOGRAM  02/11/2011   Normal LV Size & function; ef ~06-23%; grade 1 diastolic dysfunction. MILD MAC no mitral stenosis and trace mitral regurgitation, no comment of prolapse  . TUBAL LIGATION  1975   benign tumor     Current Outpatient Medications  Medication Sig Dispense Refill  . ALPRAZolam (XANAX) 0.5 MG tablet Take 0.5 mg by mouth 3 (three) times daily as needed for sleep or anxiety.    [name] amoxicillin (AMOXIL) 500 MG capsule Take 2 capsules (1,000 mg total) by mouth 2 (two) times daily. 40 capsule 0  . bisacodyl (BISACODYL) 5  MG EC tablet Take 1 tablet (5 mg total) by mouth daily as needed for moderate constipation. 30 tablet 0  . methocarbamol (ROBAXIN-750) 750 MG tablet Take 750 mg by mouth 3 (three) times daily.      . metoprolol tartrate (LOPRESSOR) 50 MG tablet TAKE ONE (1) TABLET BY MOUTH TWO (2) TIMES DAILY 180 tablet 5  . nortriptyline (PAMELOR) 50 MG capsule TAKE 2 CAPSULES (100 MG TOTAL) BY MOUTH AT BEDTIME. 180 capsule 0  . pantoprazole (PROTONIX) 40 MG tablet TAKE ONE (1) TABLET BY MOUTH EVERY DAY 90 tablet 1   No current facility-administered medications for this visit.     Allergies:   Aspirin, Latex, Codeine,  Fexofenadine, Nsaids, Tramadol, and Zolpidem tartrate    Social History:  The patient  reports that she has never smoked. She has never used smokeless tobacco. She reports that she does not drink alcohol or use drugs.   Family History:  The patient's family history includes Breast cancer in her mother; Throat cancer in her father.    ROS: All other systems are reviewed and negative. Unless otherwise mentioned in H&P    PHYSICAL EXAM: VS:  There were no vitals taken for this visit. , BMI There is no height or weight on file to calculate BMI. GEN: Well nourished, well developed, in no acute distress HEENT: normal Neck: no JVD, carotid bruits, or masses Cardiac: IRRR; no murmurs, rubs, or gallops,no edema  Respiratory:  Clear to auscultation bilaterally, normal work of breathing GI: soft, nontender, nondistended, + BS MS: no deformity or atrophy Skin: warm and dry, no rash Neuro:  Strength and sensation are intact Psych: tearful, full affect   EKG: Sinus rhythm with PVCs, incomplete right bundle branch block, heart rate of 95 bpm, QT interval is prolonged at 392 ms with a QTC of 492 ms  Recent Labs: No results found for requested labs within last 8760 hours.    Lipid Panel    Component Value Date/Time   CHOL 198 12/28/2017 1132   TRIG 76.0 12/28/2017 1132   HDL 71.30 12/28/2017 1132   CHOLHDL 3 12/28/2017 1132   VLDL 15.2 12/28/2017 1132   LDLCALC 112 (H) 12/28/2017 1132   LDLDIRECT 138.7 01/12/2012 1138      Wt Readings from Last 3 Encounters:  10/16/18 120 lb (54.4 kg)  01/11/18 130 lb (59 kg)  01/04/18 127 lb (57.6 kg)      Other studies Reviewed: See Scanned report.    ASSESSMENT AND PLAN:  1.  Worsening palpitations: Likely multifactorial in the setting of increased stress with the recent death of her husband 3 weeks ago, along with known history of frequent PVCs.  I will increase her metoprolol to 75 mg twice daily from 50 mg twice daily.  We will check  some labs to make sure her magnesium and her other electrolytes are normal limits.  We will watch her blood pressure response as she is low normal today.  She is to call us if she has any symptoms of dizziness.  2.  Anxiety and depression: History of same and is on nortriptyline and Xanax.  Has experienced more symptoms over the last few weeks.  She is to follow-up with her primary care physician for further recommendations or medication changes.   Current medicines are reviewed at length with the patient today.    Labs/ tests ordered today include: BMET. Mg, Vitamin D   Phill Myron. West Pugh, ANP, AACC   01/21/2019 10:39 AM  [address] Office [phone] Fax [phone]

## 2019-01-22 ENCOUNTER — Encounter: Payer: Self-pay | Admitting: Adult Health

## 2019-01-22 ENCOUNTER — Other Ambulatory Visit: Payer: Self-pay

## 2019-01-22 ENCOUNTER — Ambulatory Visit (INDEPENDENT_AMBULATORY_CARE_PROVIDER_SITE_OTHER): Payer: Medicare Other | Admitting: Adult Health

## 2019-01-22 VITALS — BP 128/75 | HR 95 | Temp 97.5°F | Ht 63.5 in | Wt 125.2 lb

## 2019-01-22 DIAGNOSIS — I493 Ventricular premature depolarization: Secondary | ICD-10-CM | POA: Diagnosis not present

## 2019-01-22 DIAGNOSIS — E559 Vitamin D deficiency, unspecified: Secondary | ICD-10-CM

## 2019-01-22 DIAGNOSIS — Z79899 Other long term (current) drug therapy: Secondary | ICD-10-CM

## 2019-01-22 DIAGNOSIS — R000 Tachycardia, unspecified: Secondary | ICD-10-CM

## 2019-01-22 MED ORDER — METOPROLOL TARTRATE 50 MG PO TABS: 75.0000 mg | ORAL_TABLET | Freq: Two times a day (BID) | ORAL | 3 refills | Status: DC

## 2019-01-22 NOTE — Patient Instructions (Addendum)
Medication Instructions:  INCREASE- Metoprolol 75 mg(1 1/2 tablets) by mouth twice a day  If you need a refill on your cardiac medications before your next appointment, please call your pharmacy.  Labwork: BMP, Magnesium and Vit D HERE IN OUR OFFICE AT [facility]  You will need to fast. DO NOT EAT OR DRINK PAST MIDNIGHT.     You will NOT need to fast   Take the provided lab slips with you to the lab for your blood draw.   When you have your labs (blood work) drawn today and your tests are completely normal, you will receive your results only by MyChart Message (if you have MyChart) -OR-  A paper copy in the mail.  If you have any lab test that is abnormal or we need to change your treatment, we will call you to review these results.  Testing/Procedures: None Ordered  Follow-Up: . Your physician recommends that you schedule a follow-up appointment in: 1 Month September 14th @ 9:15 am  At [facility], you and your health needs are our priority.  As part of our continuing mission to provide you with exceptional heart care, we have created designated Provider Care Teams.  These Care Teams include your primary Cardiologist (physician) and Advanced Practice Providers (APPs -  Physician Assistants and Nurse Practitioners) who all work together to provide you with the care you need, when you need it.  Thank you for choosing [facility] at [facility]!!    \

## 2019-01-23 LAB — VITAMIN D 25 HYDROXY (VIT D DEFICIENCY, FRACTURES): Vit D, 25-Hydroxy: 45.2 ng/mL (ref 30.0–100.0)

## 2019-01-23 LAB — BASIC METABOLIC PANEL
BUN/Creatinine Ratio: 5 — ABNORMAL LOW (ref 12–28)
BUN: 4 mg/dL — ABNORMAL LOW (ref 8–27)
CO2: 21 mmol/L (ref 20–29)
Calcium: 9.1 mg/dL (ref 8.7–10.3)
Chloride: 100 mmol/L (ref 96–106)
Creatinine, Ser: 0.87 mg/dL (ref 0.57–1.00)
GFR calc Af Amer: 76 mL/min/{1.73_m2} (ref >59)
GFR calc non Af Amer: 66 mL/min/{1.73_m2} (ref >59)
Glucose: 104 mg/dL — ABNORMAL HIGH (ref 65–99)
Potassium: 3.6 mmol/L (ref 3.5–5.2)
Sodium: 138 mmol/L (ref 134–144)

## 2019-01-23 LAB — MAGNESIUM: Magnesium: 1.9 mg/dL (ref 1.6–2.3)

## 2019-01-24 ENCOUNTER — Telehealth: Payer: Self-pay | Admitting: *Deleted

## 2019-01-24 NOTE — Telephone Encounter (Signed)
Pt is aware of her blood work, she stated she do not want a rx send into her pharmacy, she do have some at home and she will get some OTC, because that will be cheaper for her.

## 2019-02-04 DIAGNOSIS — H353131 Nonexudative age-related macular degeneration, bilateral, early dry stage: Secondary | ICD-10-CM | POA: Diagnosis not present

## 2019-02-04 DIAGNOSIS — H40013 Open angle with borderline findings, low risk, bilateral: Secondary | ICD-10-CM | POA: Diagnosis not present

## 2019-02-04 DIAGNOSIS — H04123 Dry eye syndrome of bilateral lacrimal glands: Secondary | ICD-10-CM | POA: Diagnosis not present

## 2019-02-04 DIAGNOSIS — H35373 Puckering of macula, bilateral: Secondary | ICD-10-CM | POA: Diagnosis not present

## 2019-02-04 DIAGNOSIS — H26493 Other secondary cataract, bilateral: Secondary | ICD-10-CM | POA: Diagnosis not present

## 2019-02-12 DIAGNOSIS — Z79899 Other long term (current) drug therapy: Secondary | ICD-10-CM | POA: Diagnosis not present

## 2019-02-12 DIAGNOSIS — G894 Chronic pain syndrome: Secondary | ICD-10-CM | POA: Diagnosis not present

## 2019-02-12 DIAGNOSIS — M4608 Spinal enthesopathy, sacral and sacrococcygeal region: Secondary | ICD-10-CM | POA: Diagnosis not present

## 2019-02-14 ENCOUNTER — Ambulatory Visit: Payer: Medicare Other

## 2019-02-15 ENCOUNTER — Ambulatory Visit (INDEPENDENT_AMBULATORY_CARE_PROVIDER_SITE_OTHER): Payer: Medicare Other

## 2019-02-15 VITALS — BP 106/57 | HR 84 | Temp 98.2°F | Ht 63.0 in | Wt 125.0 lb

## 2019-02-15 DIAGNOSIS — Z0000 Encounter for general adult medical examination without abnormal findings: Secondary | ICD-10-CM | POA: Diagnosis not present

## 2019-02-15 NOTE — Patient Instructions (Signed)
[name] , Thank you for taking time to come for your Medicare Wellness Visit. I appreciate your ongoing commitment to your health goals. Please review the following plan we discussed and let me know if I can assist you in the future.   Screening recommendations/referrals: Colonoscopy: does cologuard Mammogram: declines Bone Density: 09/2016 Recommended yearly ophthalmology/optometry visit for glaucoma screening and checkup Recommended yearly dental visit for hygiene and checkup  Vaccinations: Influenza vaccine: 03/2018 Pneumococcal vaccine: 06/2013 Tdap vaccine: 05/2017 Shingles vaccine: discussed    Advanced directives: Advance directive discussed with you today. Even though you declined this today please call our office should you change your mind and we can give you the proper paperwork for you to fill out.   Conditions/risks identified: HTN  Next appointment: 02/19/2019 at 2:40   Preventive Care 23 Years and Older, Female Preventive care refers to lifestyle choices and visits with your health care provider that can promote health and wellness. What does preventive care include?  A yearly physical exam. This is also called an annual well check.  Dental exams once or twice a year.  Routine eye exams. Ask your health care provider how often you should have your eyes checked.  Personal lifestyle choices, including:  Daily care of your teeth and gums.  Regular physical activity.  Eating a healthy diet.  Avoiding tobacco and drug use.  Limiting alcohol use.  Practicing safe sex.  Taking low-dose aspirin every day.  Taking vitamin and mineral supplements as recommended by your health care provider. What happens during an annual well check? The services and screenings done by your health care provider during your annual well check will depend on your age, overall health, lifestyle risk factors, and family history of disease. Counseling  Your health care provider may  ask you questions about your:  Alcohol use.  Tobacco use.  Drug use.  Emotional well-being.  Home and relationship well-being.  Sexual activity.  Eating habits.  History of falls.  Memory and ability to understand (cognition).  Work and work Statistician.  Reproductive health. Screening  You may have the following tests or measurements:  Height, weight, and BMI.  Blood pressure.  Lipid and cholesterol levels. These may be checked every 5 years, or more frequently if you are over 68 years old.  Skin check.  Lung cancer screening. You may have this screening every year starting at age 59 if you have a 30-pack-year history of smoking and currently smoke or have quit within the past 15 years.  Fecal occult blood test (FOBT) of the stool. You may have this test every year starting at age 53.  Flexible sigmoidoscopy or colonoscopy. You may have a sigmoidoscopy every 5 years or a colonoscopy every 10 years starting at age 42.  Hepatitis C blood test.  Hepatitis B blood test.  Sexually transmitted disease (STD) testing.  Diabetes screening. This is done by checking your blood sugar (glucose) after you have not eaten for a while (fasting). You may have this done every 1-3 years.  Bone density scan. This is done to screen for osteoporosis. You may have this done starting at age 33.  Mammogram. This may be done every 1-2 years. Talk to your health care provider about how often you should have regular mammograms. Talk with your health care provider about your test results, treatment options, and if necessary, the need for more tests. Vaccines  Your health care provider may recommend certain vaccines, such as:  Influenza vaccine. This is recommended  every year.  Tetanus, diphtheria, and acellular pertussis (Tdap, Td) vaccine. You may need a Td booster every 10 years.  Zoster vaccine. You may need this after age 32.  Pneumococcal 13-valent conjugate (PCV13) vaccine. One  dose is recommended after age 56.  Pneumococcal polysaccharide (PPSV23) vaccine. One dose is recommended after age 84. Talk to your health care provider about which screenings and vaccines you need and how often you need them. This information is not intended to replace advice given to you by your health care provider. Make sure you discuss any questions you have with your health care provider. Document Released: 06/26/2015 Document Revised: 02/17/2016 Document Reviewed: 03/31/2015 Elsevier Interactive Patient Education  2017 [place] Prevention in the Home Falls can cause injuries. They can happen to people of all ages. There are many things you can do to make your home safe and to help prevent falls. What can I do on the outside of my home?  Regularly fix the edges of walkways and driveways and fix any cracks.  Remove anything that might make you trip as you walk through a door, such as a raised step or threshold.  Trim any bushes or trees on the path to your home.  Use bright outdoor lighting.  Clear any walking paths of anything that might make someone trip, such as rocks or tools.  Regularly check to see if handrails are loose or broken. Make sure that both sides of any steps have handrails.  Any raised decks and porches should have guardrails on the edges.  Have any leaves, snow, or ice cleared regularly.  Use sand or salt on walking paths during winter.  Clean up any spills in your garage right away. This includes oil or grease spills. What can I do in the bathroom?  Use night lights.  Install grab bars by the toilet and in the tub and shower. Do not use towel bars as grab bars.  Use non-skid mats or decals in the tub or shower.  If you need to sit down in the shower, use a plastic, non-slip stool.  Keep the floor dry. Clean up any water that spills on the floor as soon as it happens.  Remove soap buildup in the tub or shower regularly.  Attach bath  mats securely with double-sided non-slip rug tape.  Do not have throw rugs and other things on the floor that can make you trip. What can I do in the bedroom?  Use night lights.  Make sure that you have a light by your bed that is easy to reach.  Do not use any sheets or blankets that are too big for your bed. They should not hang down onto the floor.  Have a firm chair that has side arms. You can use this for support while you get dressed.  Do not have throw rugs and other things on the floor that can make you trip. What can I do in the kitchen?  Clean up any spills right away.  Avoid walking on wet floors.  Keep items that you use a lot in easy-to-reach places.  If you need to reach something above you, use a strong step stool that has a grab bar.  Keep electrical cords out of the way.  Do not use floor polish or wax that makes floors slippery. If you must use wax, use non-skid floor wax.  Do not have throw rugs and other things on the floor that can make you trip. What  can I do with my stairs?  Do not leave any items on the stairs.  Make sure that there are handrails on both sides of the stairs and use them. Fix handrails that are broken or loose. Make sure that handrails are as long as the stairways.  Check any carpeting to make sure that it is firmly attached to the stairs. Fix any carpet that is loose or worn.  Avoid having throw rugs at the top or bottom of the stairs. If you do have throw rugs, attach them to the floor with carpet tape.  Make sure that you have a light switch at the top of the stairs and the bottom of the stairs. If you do not have them, ask someone to add them for you. What else can I do to help prevent falls?  Wear shoes that:  Do not have high heels.  Have rubber bottoms.  Are comfortable and fit you well.  Are closed at the toe. Do not wear sandals.  If you use a stepladder:  Make sure that it is fully opened. Do not climb a closed  stepladder.  Make sure that both sides of the stepladder are locked into place.  Ask someone to hold it for you, if possible.  Clearly [place] and make sure that you can see:  Any grab bars or handrails.  First and last steps.  Where the edge of each step is.  Use tools that help you move around (mobility aids) if they are needed. These include:  Canes.  Walkers.  Scooters.  Crutches.  Turn on the lights when you go into a dark area. Replace any light bulbs as soon as they burn out.  Set up your furniture so you have a clear path. Avoid moving your furniture around.  If any of your floors are uneven, fix them.  If there are any pets around you, be aware of where they are.  Review your medicines with your doctor. Some medicines can make you feel dizzy. This can increase your chance of falling. Ask your doctor what other things that you can do to help prevent falls. This information is not intended to replace advice given to you by your health care provider. Make sure you discuss any questions you have with your health care provider. Document Released: 03/26/2009 Document Revised: 11/05/2015 Document Reviewed: 07/04/2014 Elsevier Interactive Patient Education  2017 [employer].

## 2019-02-15 NOTE — Progress Notes (Signed)
PCP notes:  Health Maintenance:  Flu vaccine is due.  Abnormal Screenings:  Depression screen:12; Dx of depression  Patient concerns:  None  Nurse concerns:  None  Next PCP appt.: 02/19/2019 at 2:40

## 2019-02-15 NOTE — Progress Notes (Addendum)
Subjective:   Tiffany [name] is a 73 y.o. female who presents for Medicare Annual (Subsequent) preventive examination.  This visit type was conducted due to national recommendations for restrictions regarding the COVID-19 Pandemic (e.g. social distancing). This format is felt to be most appropriate for this patient at this time. All issues noted in this document were discussed and addressed. No physical exam was performed (except for noted visual exam findings with Video Visits). This patient, Tiffany [name], has given permission to perform this visit via telephone. Vital signs may be absent or patient reported.  Patient location:  At home  Nurse location:  At home     Review of Systems:  n/a Cardiac Risk Factors include: advanced age (>51mn, >>17women);dyslipidemia;hypertension     Objective:     Vitals: BP (!) 106/57 Comment: per patient  Pulse 84 Comment: per patient  Temp 98.2 F (36.8 C) Comment: per patient  Ht _0  (1.6 m) Comment: per patient  Wt 125 lb (56.7 kg) Comment: per patient  BMI 22.14 kg/m   Body mass index is 22.14 kg/m.  Advanced Directives 02/15/2019 09/14/2017 09/08/2016 08/10/2012  Does Patient Have a Medical Advance Directive? No No Yes Patient does not have advance directive  Type of Advance Directive - - [place] -  Copy of [place] Chart? - - No - copy requested -  Would patient like information on creating a medical advance directive? - Yes (MAU/Ambulatory/Procedural Areas - Information given) - -    Tobacco Social History   Tobacco Use  Smoking Status Never Smoker  Smokeless Tobacco Never Used     Counseling given: Not Answered   Clinical Intake:  Pre-visit preparation completed: Yes  Pain : No/denies pain     Nutritional Status: BMI of 19-24  Normal Nutritional Risks: None Diabetes: No  How often do you need to have someone help you when you read instructions,  pamphlets, or other written materials from your doctor or pharmacy?: 1 - Never What is the last grade level you completed in school?: GED  Interpreter Needed?: No  Information entered by :: NAllen LPN  Past Medical History:  Diagnosis Date  . Anxiety and depression   . Arthritis    knee  . Dupuytren's contracture of hand   . Esophageal spasm   . GERD (gastroesophageal reflux disease)   . Heart murmur   . History of MRI of cervical spine 09/98   Dr. DRolin Barry . History of MRI of lumbar spine 10/1998   Dr. DRolin Barry . Hyperlipemia   . Hypertension   . Osteoporosis 07/15/1999  . Plantar fasciitis, bilateral    Past Surgical History:  Procedure Laterality Date  . 2 D Echo  08/1999~04/13/2006   Mild MVP, MILD MR, Mild aortic sclerosis  . Abd ultrasound  03/02/1999   NML, no gallstones  . CARDIAC CATHETERIZATION  01/2001   Nonobstructive CAD  . ESOPHAGOGASTRODUODENOSCOPY     Sliding H. H. ~ 07/1995-11/2003 Neg  . ESOPHAGOGASTRODUODENOSCOPY N/A 08/10/2012   Procedure: ESOPHAGOGASTRODUODENOSCOPY (EGD);  Surgeon: DLafayette Dragon MD;  [facility];  Service: Endoscopy;  Laterality: N/A;  . KNEE SURGERY    . [place] . NM LEXISCAN MYOVIEW LTD  03/26/2012   LOW RISK; no ischemia or infarction. Gut attenuation.  .Marland KitchenSAVORY DILATION N/A 08/10/2012   Procedure: SAVORY DILATION;  Surgeon: DLafayette Dragon MD;  [facility];  Service: Endoscopy;  Laterality: N/A;  . SPIROMETRY  06/2004   NML  . TONSILLECTOMY AND ADENOIDECTOMY  as a child  . TRANSTHORACIC ECHOCARDIOGRAM  02/11/2011   Normal LV Size & function; ef ~51-02%; grade 1 diastolic dysfunction. MILD MAC no mitral stenosis and trace mitral regurgitation, no comment of prolapse  . TUBAL LIGATION  1975   benign tumor   Family History  Problem Relation Age of Onset  . Breast cancer Mother   . Throat cancer Father    Social History   Socioeconomic History  . Marital status: Widowed    Spouse  name: Not on file  . Number of children: 2  . Years of education: Not on file  . Highest education level: Not on file  Occupational History  . Occupation: Nurse's Special educational needs teacher: RETIRED  Social Needs  . Financial resource strain: Not hard at all  . Food insecurity    Worry: Never true    Inability: Never true  . Transportation needs    Medical: No    Non-medical: No  Tobacco Use  . Smoking status: Never Smoker  . Smokeless tobacco: Never Used  Substance and Sexual Activity  . Alcohol use: No    Alcohol/week: 0.0 standard drinks  . Drug use: No  . Sexual activity: Not Currently  Lifestyle  . Physical activity    Days per week: 3 days    Minutes per session: 30 min  . Stress: Rather much  Relationships  . Social Herbalist on phone: Not on file    Gets together: Not on file    Attends religious service: Not on file    Active member of club or organization: Not on file    Attends meetings of clubs or organizations: Not on file    Relationship status: Not on file  Other Topics Concern  . Not on file  Social History Narrative   73 year old, white woman, married mother of 3 with only one child living. She has 3 grandchildren. Her son that had spina bifida died at age 75 2009-08-22).   Never smoked. Does not drink alcohol.    Outpatient Encounter Medications as of 02/15/2019  Medication Sig  . ALPRAZolam (XANAX) 0.5 MG tablet Take 0.5 mg by mouth 3 (three) times daily as needed for sleep or anxiety.  . bisacodyl (BISACODYL) 5 MG EC tablet Take 1 tablet (5 mg total) by mouth daily as needed for moderate constipation.  . methocarbamol (ROBAXIN-750) 750 MG tablet Take 750 mg by mouth 3 (three) times daily.    . metoprolol tartrate (LOPRESSOR) 50 MG tablet Take 1.5 tablets (75 mg total) by mouth 2 (two) times daily.  . nortriptyline (PAMELOR) 50 MG capsule TAKE 2 CAPSULES (100 MG TOTAL) BY MOUTH AT BEDTIME.  . pantoprazole (PROTONIX) 40 MG tablet TAKE ONE (1) TABLET BY  MOUTH EVERY DAY   No facility-administered encounter medications on file as of 02/15/2019.     Activities of Daily Living In your present state of health, do you have any difficulty performing the following activities: 02/15/2019  Hearing? Y  Comment just with new telephone  Vision? N  Difficulty concentrating or making decisions? N  Walking or climbing stairs? Y  Dressing or bathing? N  Doing errands, shopping? N  Preparing Food and eating ? N  Using the Toilet? N  In the past six months, have you accidently leaked urine? N  Do you have problems with loss of bowel control? N  Managing  your Medications? N  Managing your Finances? N  Housekeeping or managing your Housekeeping? N  Some recent data might be hidden    Patient Care Team: [name], MD as PCP - General Calvert Cantor, MD as Consulting Physician (Ophthalmology)    Assessment:   This is a routine wellness examination for Tiffany [name].  Exercise Activities and Dietary recommendations Current Exercise Habits: Home exercise routine, Time (Minutes): 30, Frequency (Times/Week): 3, Weekly Exercise (Minutes/Week): 90  Goals    . DIET - INCREASE WATER INTAKE     Starting 09/14/2017, I will continue to drink at least 6-8 glasses of water daily.     . Increase physical activity     Starting 09/08/2016, I will continue to exercise at least 60 min 3-4 days per week.     . Patient Stated     02/15/2019, to keep heart in rhythym       Fall Risk Fall Risk  02/15/2019 09/14/2017 09/08/2016 05/12/2015 08/27/2014  Falls in the past year? 0 No No No No  Risk for fall due to : Medication side effect - - - -  Follow up Falls evaluation completed;Falls prevention discussed - - - -   Is the patient's home free of loose throw rugs in walkways, pet beds, electrical cords, etc?   yes      Grab bars in the bathroom? yes      Handrails on the stairs?   yes      Adequate lighting?   yes  Timed Get Up and Go performed: n/a  Depression Screen  PHQ 2/9 Scores 02/15/2019 09/14/2017 09/08/2016 01/21/2016  PHQ - 2 Score 6 0 1 0  PHQ- 9 Score 12 0 4 3     Cognitive Function MMSE - Mini Mental State Exam 02/15/2019 09/14/2017 09/08/2016  Orientation to time _0 Orientation to Place _1 Registration _2 Attention/ Calculation 5 0 0  Recall _3 Language- name 2 objects 0 0 0  Language- repeat _4 Language- follow 3 step command 0 3 3  Language- read & follow direction 0 0 0  Write a sentence 0 0 0  Copy design 0 0 0  Total score _5 Mini Cog  Mini-Cog screen was completed. Maximum score is 22. A value of 0 denotes this part of the MMSE was not completed or the patient failed this part of the Mini-Cog screening.       Immunization History  Administered Date(s) Administered  . Hepatitis B 06/13/1996, 07/14/1996, 11/11/1996  . Influenza Whole 04/09/2007, 04/01/2008, 04/24/2009, 04/06/2010  . Influenza, High Dose Seasonal PF 04/05/2018  . Influenza,inj,Quad PF,6+ Mos 03/19/2013, 02/26/2014, 05/12/2015, 03/11/2016, 04/13/2017  . Pneumococcal Conjugate-13 05/01/2014  . Pneumococcal Polysaccharide-23 04/24/2009  . Td 06/13/2000, 06/09/2017    Qualifies for Shingles Vaccine? yes  Screening Tests Health Maintenance  Topic Date Due  . INFLUENZA VACCINE  01/12/2019  . MAMMOGRAM  04/13/2019 (Originally 04/12/2013)  . Fecal DNA (Cologuard)  09/21/2019  . TETANUS/TDAP  06/10/2027  . DEXA SCAN  Completed  . Hepatitis C Screening  Completed  . PNA vac Low Risk Adult  Completed    Cancer Screenings: Lung: Low Dose CT Chest recommended if Age 42-80 years, 30 pack-year currently smoking OR have quit w/in 15years. Patient does not qualify. Breast:  Up to date on Mammogram? No   Up to date of Bone Density/Dexa? Yes Colorectal: up to date  Additional Screenings: : Hepatitis C Screening: 04/2015     Plan:    Patient wants heart to stay in rhythm.   I have personally reviewed and noted the following in the  patient's chart:   . Medical and social history . Use of alcohol, tobacco or illicit drugs  . Current medications and supplements . Functional ability and status . Nutritional status . Physical activity . Advanced directives . List of other physicians . Hospitalizations, surgeries, and ER visits in previous 12 months . Vitals . Screenings to include cognitive, depression, and falls . Referrals and appointments  In addition, I have reviewed and discussed with patient certain preventive protocols, quality metrics, and best practice recommendations. A written personalized care plan for preventive services as well as general preventive health recommendations were provided to patient.     Kellie Simmering, LPN  0/07/3341

## 2019-02-19 ENCOUNTER — Encounter: Payer: Medicare Other | Admitting: Family Medicine

## 2019-02-19 ENCOUNTER — Ambulatory Visit (INDEPENDENT_AMBULATORY_CARE_PROVIDER_SITE_OTHER): Payer: Medicare Other | Admitting: Family Medicine

## 2019-02-19 ENCOUNTER — Encounter: Payer: Self-pay | Admitting: Family Medicine

## 2019-02-19 ENCOUNTER — Other Ambulatory Visit: Payer: Self-pay

## 2019-02-19 VITALS — BP 106/64 | HR 60 | Temp 98.5°F | Ht 63.0 in | Wt 124.8 lb

## 2019-02-19 DIAGNOSIS — I10 Essential (primary) hypertension: Secondary | ICD-10-CM

## 2019-02-19 DIAGNOSIS — E7849 Other hyperlipidemia: Secondary | ICD-10-CM

## 2019-02-19 DIAGNOSIS — F411 Generalized anxiety disorder: Secondary | ICD-10-CM

## 2019-02-19 DIAGNOSIS — Z0000 Encounter for general adult medical examination without abnormal findings: Secondary | ICD-10-CM

## 2019-02-19 DIAGNOSIS — F331 Major depressive disorder, recurrent, moderate: Secondary | ICD-10-CM

## 2019-02-19 DIAGNOSIS — M810 Age-related osteoporosis without current pathological fracture: Secondary | ICD-10-CM

## 2019-02-19 DIAGNOSIS — F4321 Adjustment disorder with depressed mood: Secondary | ICD-10-CM | POA: Insufficient documentation

## 2019-02-19 DIAGNOSIS — Z23 Encounter for immunization: Secondary | ICD-10-CM | POA: Diagnosis not present

## 2019-02-19 MED ORDER — NORTRIPTYLINE HCL 50 MG PO CAPS
150.0000 mg | ORAL_CAPSULE | Freq: Every day | ORAL | 0 refills | Status: DC
Start: 2019-02-19 — End: 2019-05-28

## 2019-02-19 MED ORDER — ALPRAZOLAM 0.5 MG PO TABS: 0.5000 mg | ORAL_TABLET | Freq: Three times a day (TID) | ORAL | 0 refills | Status: DC | PRN

## 2019-02-19 NOTE — Patient Instructions (Signed)
Increase nortriptyline to 150 mg at bedtime for mood.  If depression and insomnia not improving in 4 weeks follow up for medication change.  We will call to set up bone density.  Continue low cholesterol diet and increase exercise.

## 2019-02-19 NOTE — Progress Notes (Signed)
Chief Complaint  Patient presents with  . Annual Exam    Part 2    History of Present Illness: HPI   The patient presents for complete physical and review of chronic health problems. He/She also has the following acute concerns today:decreased appetite and sadness  The patient saw a LPN or RN for medicare wellness visit.  Prevention and wellness was reviewed in detail. Note reviewed and important notes copied below.  Elevated Cholesterol: Poor control.  Statin indicated but Pt refuses. 14.6 % risk  10 year risk.  Lab Results  Component Value Date   CHOL 198 12/28/2017   HDL 71.30 12/28/2017   LDLCALC 112 (H) 12/28/2017   LDLDIRECT 138.7 01/12/2012   TRIG 76.0 12/28/2017   CHOLHDL 3 12/28/2017  Using medications without problems: Muscle aches:  Diet compliance: poor appetite Exercise: walking, working in yard Other complaints:  Hypertension:    Good control.. lower with increase in metoprolol as below. BP Readings from Last 3 Encounters:  02/19/19 106/64  02/15/19 (!) 106/57  01/22/19 128/75  Using medication without problems or lightheadedness:  Chest pain with exertion:none Edema:none Short of breath:none Average home BPs: Other issues:  Husband passed away in 01-23-2019.. passed from lung cancer. MDD, she has been more tearful, decreased appetite lately. Can talk to her daughter and her grandson lives next door. GAD:   Some trouble falling asleep.. hard to get used to husband having been gone.  She is using nortriptyline at night. Using alprazolam three times a day.   Cardiologist Dr. Ellyn Hack.. increased metoprolol to 75 mg twice daily given increase in palpitations.  Chronic back pain:  Using robaxin 2-3 times daily as needed for low back pain.  Followed by Dr. Hardin Negus at [place].[name]    COVID 19 screen No recent travel or known exposure to COVID19 The patient denies respiratory symptoms of COVID 19 at this time.  The importance of social distancing was  discussed today.   Review of Systems  Constitutional: Positive for weight loss. Negative for chills and fever.  HENT: Negative for congestion and ear pain.   Eyes: Negative for pain and redness.  Respiratory: Negative for cough and shortness of breath.   Cardiovascular: Negative for chest pain, palpitations and leg swelling.  Gastrointestinal: Negative for abdominal pain, blood in stool, constipation, diarrhea, nausea and vomiting.  Genitourinary: Negative for dysuria.  Musculoskeletal: Negative for falls and myalgias.  Skin: Negative for rash.  Neurological: Negative for dizziness.  Psychiatric/Behavioral: Positive for depression. Negative for suicidal ideas. The patient is nervous/anxious.       Past Medical History:  Diagnosis Date  . Anxiety and depression   . Arthritis    knee  . Dupuytren's contracture of hand   . Esophageal spasm   . GERD (gastroesophageal reflux disease)   . Heart murmur   . History of MRI of cervical spine 09/98   Dr. Rolin Barry  . History of MRI of lumbar spine 10/1998   Dr. Rolin Barry  . Hyperlipemia   . Hypertension   . Osteoporosis 07/15/1999  . Plantar fasciitis, bilateral     reports that she has never smoked. She has never used smokeless tobacco. She reports that she does not drink alcohol or use drugs.   Current Outpatient Medications:  .  ALPRAZolam (XANAX) 0.5 MG tablet, Take 0.5 mg by mouth 3 (three) times daily as needed for sleep or anxiety., Disp: , Rfl:  .  bisacodyl (BISACODYL) 5 MG EC tablet, Take 1 tablet (5 mg  total) by mouth daily as needed for moderate constipation., Disp: 30 tablet, Rfl: 0 .  methocarbamol (ROBAXIN-750) 750 MG tablet, Take 750 mg by mouth 3 (three) times daily.  , Disp: , Rfl:  .  metoprolol tartrate (LOPRESSOR) 50 MG tablet, Take 1.5 tablets (75 mg total) by mouth 2 (two) times daily., Disp: 270 tablet, Rfl: 3 .  nortriptyline (PAMELOR) 50 MG capsule, TAKE 2 CAPSULES (100 MG TOTAL) BY MOUTH AT BEDTIME., Disp: 180  capsule, Rfl: 0 .  pantoprazole (PROTONIX) 40 MG tablet, TAKE ONE (1) TABLET BY MOUTH EVERY DAY, Disp: 90 tablet, Rfl: 1   Observations/Objective: Blood pressure 106/64, pulse 60, temperature 98.5 F (36.9 C), temperature source Temporal, height 5\' 3"  (1.6 m), weight 124 lb 12 oz (56.6 kg).  Physical Exam Constitutional:      General: She is not in acute distress.    Appearance: Normal appearance. She is well-developed. She is not ill-appearing or toxic-appearing.  HENT:     Head: Normocephalic.     Right Ear: Hearing, tympanic membrane, ear canal and external ear normal.     Left Ear: Hearing, tympanic membrane, ear canal and external ear normal.     Nose: Nose normal.  Eyes:     General: Lids are normal. Lids are everted, no foreign bodies appreciated.     Conjunctiva/sclera: Conjunctivae normal.     Pupils: Pupils are equal, round, and reactive to light.  Neck:     Musculoskeletal: Normal range of motion and neck supple.     Thyroid: No thyroid mass or thyromegaly.     Vascular: No carotid bruit.     Trachea: Trachea normal.  Cardiovascular:     Rate and Rhythm: Normal rate and regular rhythm.     Heart sounds: Normal heart sounds, S1 normal and S2 normal. No murmur. No gallop.   Pulmonary:     Effort: Pulmonary effort is normal. No respiratory distress.     Breath sounds: Normal breath sounds. No wheezing, rhonchi or rales.  Abdominal:     General: Bowel sounds are normal. There is no distension or abdominal bruit.     Palpations: Abdomen is soft. There is no fluid wave or mass.     Tenderness: There is no abdominal tenderness. There is no guarding or rebound.     Hernia: No hernia is present.  Lymphadenopathy:     Cervical: No cervical adenopathy.  Skin:    General: Skin is warm and dry.     Findings: No rash.  Neurological:     Mental Status: She is alert.     Cranial Nerves: No cranial nerve deficit.     Sensory: No sensory deficit.  Psychiatric:        Mood and  Affect: Mood is depressed. Mood is not anxious.        Speech: Speech normal.        Behavior: Behavior is withdrawn. Behavior is cooperative.        Judgment: Judgment normal.      Assessment and Plan    The patient's preventative maintenance and recommended screening tests for an annual wellness exam were reviewed in full today. Brought up to date unless services declined.  Other hyperlipidemia Statin indicated but pt continues to refuse despite risk of CVD.  Essential hypertension, benign Well controlled. Continue current medication.   Grieving Offered medication change and grief counseling. She will consider.  Depression, major, recurrent Increase nortriptyline to 150 mg at bedtime for mood.  If  depression and insomnia not improving in 4 weeks follow up for medication change.   GAD (generalized anxiety disorder) On alprazolam TID longterm   Counselled on the importance of diet, exercise, and its role in overall health and mortality. The patient's FH and SH was reviewed, including their home life, tobacco status, and drug and alcohol status.   Refused mammogram  cologuard 2018  Uptodate with vaccines.[name] given flu vaccine today. Last DEXA 2018 osteoporosis   Kerby Nora, MD

## 2019-02-21 NOTE — Progress Notes (Signed)
Cardiology Office Note   Date:  02/25/2019   ID:  Tiffany [name], Tiffany [name] July 18, 1945, MRN [id number]  PCP:  [name], MD  Cardiologist:  Ellyn Hack  Follow Up    History of Present Illness: Tiffany [name] is a 73 y.o. female who presents for ongoing assessment and management of palpitations. Her husband died in 2019/01/11 and she noticed that her palpitations were worse around that time. On last office visit on 01/22/2019, I increased the metoprolol dose to 75 mg BID from 50 mg BID. She was advised to see PCP for help with anxiety.  She comes today feeling better concerning her palpitations with increased dose of metoprolol. She is very tearful and continues to mourn her husband's death. During the [place] pandemic, her sources of support are limited. She normally likes be involved in church activities and see friends. She has not been involved in a Grief support group, as there has been no access to this during pandemic.   Past Medical History:  Diagnosis Date  . Anxiety and depression   . Arthritis    knee  . Dupuytren's contracture of hand   . Esophageal spasm   . GERD (gastroesophageal reflux disease)   . Heart murmur   . History of MRI of cervical spine 09/98   Dr. Rolin Barry  . History of MRI of lumbar spine 10/1998   Dr. Rolin Barry  . Hyperlipemia   . Hypertension   . Osteoporosis 07/15/1999  . Plantar fasciitis, bilateral     Past Surgical History:  Procedure Laterality Date  . 2 D Echo  08/1999~04/13/2006   Mild MVP, MILD MR, Mild aortic sclerosis  . Abd ultrasound  03/02/1999   NML, no gallstones  . CARDIAC CATHETERIZATION  01/2001   Nonobstructive CAD  . ESOPHAGOGASTRODUODENOSCOPY     Sliding H. H. ~ 07/1995-11/2003 Neg  . ESOPHAGOGASTRODUODENOSCOPY N/A 08/10/2012   Procedure: ESOPHAGOGASTRODUODENOSCOPY (EGD);  Surgeon: Lafayette Dragon, MD;  [facility];  Service: Endoscopy;  Laterality: N/A;  . KNEE SURGERY    . [place]  . NM LEXISCAN  MYOVIEW LTD  03/26/2012   LOW RISK; no ischemia or infarction. Gut attenuation.  [name] SAVORY DILATION N/A 08/10/2012   Procedure: SAVORY DILATION;  Surgeon: Lafayette Dragon, MD;  Location: [facility];  Service: Endoscopy;  Laterality: N/A;  . SPIROMETRY  06/2004   NML  . TONSILLECTOMY AND ADENOIDECTOMY  as a child  . TRANSTHORACIC ECHOCARDIOGRAM  02/11/2011   Normal LV Size & function; ef ~00-86%; grade 1 diastolic dysfunction. MILD MAC no mitral stenosis and trace mitral regurgitation, no comment of prolapse  . TUBAL LIGATION  1975   benign tumor     Current Outpatient Medications  Medication Sig Dispense Refill  . ALPRAZolam (XANAX) 0.5 MG tablet Take 1 tablet (0.5 mg total) by mouth 3 (three) times daily as needed for anxiety. 90 tablet 0  . bisacodyl (BISACODYL) 5 MG EC tablet Take 1 tablet (5 mg total) by mouth daily as needed for moderate constipation. 30 tablet 0  . methocarbamol (ROBAXIN-750) 750 MG tablet Take 750 mg by mouth 3 (three) times daily.      . metoprolol tartrate (LOPRESSOR) 50 MG tablet Take 1.5 tablets (75 mg total) by mouth 2 (two) times daily. 270 tablet 3  . nortriptyline (PAMELOR) 50 MG capsule Take 3 capsules (150 mg total) by mouth at bedtime. 270 capsule 0  . pantoprazole (PROTONIX) 40 MG tablet TAKE ONE (1) TABLET  BY MOUTH EVERY DAY 90 tablet 1   No current facility-administered medications for this visit.     Allergies:   Aspirin, Latex, Codeine, Fexofenadine, Nsaids, Tramadol, and Zolpidem tartrate    Social History:  The patient  reports that she has never smoked. She has never used smokeless tobacco. She reports that she does not drink alcohol or use drugs.   Family History:  The patient's family history includes Breast cancer in her mother; Throat cancer in her father.    ROS: All other systems are reviewed and negative. Unless otherwise mentioned in H&P    PHYSICAL EXAM: VS:  BP 112/60   Pulse 79   Ht _0  (1.6 m)   Wt 128 lb (58.1 kg)   BMI  22.67 kg/m  , BMI Body mass index is 22.67 kg/m. GEN: Well nourished, well developed, in no acute distress HEENT: normal Neck: no JVD, carotid bruits, or masses Cardiac: RRR; 1/6 systolic murmurs, rubs, or gallops,no edema  Respiratory:  Clear to auscultation bilaterally, normal work of breathing GI: soft, nontender, nondistended, + BS MS: no deformity or atrophy Skin: warm and dry, no rash Neuro:  Strength and sensation are intact Psych: euthymic mood, tearful.    EKG:  NSR with occasional PVC's with RBBB. Rate pf 79 bpm, with left axis deviation.   Recent Labs: 01/22/2019: BUN 4; Creatinine, Ser 0.87; Magnesium 1.9; Potassium 3.6; Sodium 138    Lipid Panel    Component Value Date/Time   CHOL 198 12/28/2017 1132   TRIG 76.0 12/28/2017 1132   HDL 71.30 12/28/2017 1132   CHOLHDL 3 12/28/2017 1132   VLDL 15.2 12/28/2017 1132   LDLCALC 112 (H) 12/28/2017 1132   LDLDIRECT 138.7 01/12/2012 1138      Wt Readings from Last 3 Encounters:  02/25/19 128 lb (58.1 kg)  02/19/19 124 lb 12 oz (56.6 kg)  02/15/19 125 lb (56.7 kg)      Other studies Reviewed: See scanned report echo 2012   ASSESSMENT AND PLAN:  1. Palpitations: She is tolerating the new dose of BB BID.  Reduced symptoms.   2. Situational Grief: She is encouraged to reach out to her pastor for assistance for how she can become further connected to her church support group. She is eager to find some normalcy.   3. Anxiety: Sees PCP on Xanax.    Current medicines are reviewed at length with the patient today.    Labs/ tests ordered today include: None  Phill Myron. West Pugh, ANP, [facility]   02/25/2019 10:22 AM    [facility] [address] Office [phone] Fax [phone]

## 2019-02-25 ENCOUNTER — Other Ambulatory Visit: Payer: Self-pay

## 2019-02-25 ENCOUNTER — Ambulatory Visit (INDEPENDENT_AMBULATORY_CARE_PROVIDER_SITE_OTHER): Payer: Medicare Other | Admitting: Adult Health

## 2019-02-25 ENCOUNTER — Encounter: Payer: Self-pay | Admitting: Adult Health

## 2019-02-25 VITALS — BP 112/60 | HR 79 | Ht 63.0 in | Wt 128.0 lb

## 2019-02-25 DIAGNOSIS — F4321 Adjustment disorder with depressed mood: Secondary | ICD-10-CM | POA: Diagnosis not present

## 2019-02-25 DIAGNOSIS — I059 Rheumatic mitral valve disease, unspecified: Secondary | ICD-10-CM | POA: Diagnosis not present

## 2019-02-25 DIAGNOSIS — R002 Palpitations: Secondary | ICD-10-CM | POA: Diagnosis not present

## 2019-02-25 NOTE — Patient Instructions (Signed)
Medication Instructions:  Continue current medications  If you need a refill on your cardiac medications before your next appointment, please call your pharmacy.  Labwork: None Ordered   Testing/Procedures: None Ordered  Follow-Up: You will need a follow up appointment in 6 months.  Please call our office 2 months in advance to schedule this appointment.  You may see Dr Harding or one of the following Advanced Practice Providers on your designated Care Team:   Rhonda Barrett, PA-C . Kathryn Lawrence, DNP, ANP     At [facility], you and your health needs are our priority.  As part of our continuing mission to provide you with exceptional heart care, we have created designated Provider Care Teams.  These Care Teams include your primary Cardiologist (physician) and Advanced Practice Providers (APPs -  Physician Assistants and Nurse Practitioners) who all work together to provide you with the care you need, when you need it.  Thank you for choosing [facility]!!     

## 2019-02-28 DIAGNOSIS — F411 Generalized anxiety disorder: Secondary | ICD-10-CM | POA: Insufficient documentation

## 2019-02-28 NOTE — Assessment & Plan Note (Signed)
Increase nortriptyline to 150 mg at bedtime for mood.  If depression and insomnia not improving in 4 weeks follow up for medication change.

## 2019-02-28 NOTE — Assessment & Plan Note (Signed)
On alprazolam TID longterm

## 2019-02-28 NOTE — Assessment & Plan Note (Signed)
Offered medication change and grief counseling. She will consider.

## 2019-02-28 NOTE — Assessment & Plan Note (Signed)
Well controlled. Continue current medication.  

## 2019-02-28 NOTE — Assessment & Plan Note (Signed)
Statin indicated but pt continues to refuse despite risk of CVD.

## 2019-03-22 ENCOUNTER — Other Ambulatory Visit: Payer: Self-pay

## 2019-03-22 ENCOUNTER — Ambulatory Visit (INDEPENDENT_AMBULATORY_CARE_PROVIDER_SITE_OTHER): Payer: Medicare Other | Admitting: Family Medicine

## 2019-03-22 DIAGNOSIS — S91339A Puncture wound without foreign body, unspecified foot, initial encounter: Secondary | ICD-10-CM | POA: Diagnosis not present

## 2019-03-22 DIAGNOSIS — L089 Local infection of the skin and subcutaneous tissue, unspecified: Secondary | ICD-10-CM | POA: Diagnosis not present

## 2019-03-22 MED ORDER — LEVOFLOXACIN 500 MG PO TABS: 500.0000 mg | ORAL_TABLET | Freq: Every day | ORAL | 0 refills | Status: DC

## 2019-03-22 NOTE — Patient Instructions (Signed)
Ice,and elevated right foot.  Complete all the antibiotics.  Call for follow up appt next week if not gradually improving as expected.  Go to ER over the weekend, if worsening pain, redness spreading up leg, fever on antibiotic or  Cannot keep down antibiotics.

## 2019-03-22 NOTE — Assessment & Plan Note (Addendum)
Cocerning for infeciton... will treat with levoquin to cover pseudomonas.  Doubt foregin body or clear need for X-ray.  Pt will follow up if not improving as expected.  ICe and elevate... will go to ER if worsening over weekend.  Pt uptodate with Td.

## 2019-03-22 NOTE — Progress Notes (Signed)
Chief Complaint  Patient presents with  . Foot Swelling    Stepped on rusty nail last night-Td 06/09/2017    History of Present Illness: HPI   72 nondiabetic female pt with new onset plantar injury to rightt foot presents with pain in foot and swelling. Went through shoes.   She reports she stepped on a rusty nail on the evening of 03/21/2019. No broken off foreign body remaining .. visualized nail  She cleaned with water, epsom slat and cleaned with peroxide.  Elevating the foot. Pain is increasing .[name] using walker.. pain with with weight bearing.   No fever, no N/V.    Last td 06/09/2017.. has been vaccination repeatedly over years.  COVID 19 screen No recent travel or known exposure to COVID19 The patient denies respiratory symptoms of COVID 19 at this time.  The importance of social distancing was discussed today.   Review of Systems  Constitutional: Negative for chills and fever.  HENT: Negative for congestion and ear pain.   Eyes: Negative for pain and redness.  Respiratory: Negative for cough and shortness of breath.   Cardiovascular: Negative for chest pain, palpitations and leg swelling.  Gastrointestinal: Negative for abdominal pain, blood in stool, constipation, diarrhea, nausea and vomiting.  Genitourinary: Negative for dysuria.  Musculoskeletal: Negative for falls and myalgias.  Skin: Negative for rash.  Neurological: Negative for dizziness.  Psychiatric/Behavioral: Negative for depression. The patient is not nervous/anxious.       Past Medical History:  Diagnosis Date  . Anxiety and depression   . Arthritis    knee  . Dupuytren's contracture of hand   . Esophageal spasm   . GERD (gastroesophageal reflux disease)   . Heart murmur   . History of MRI of cervical spine 09/98   Dr. Elesa Hacker  . History of MRI of lumbar spine 10/1998   Dr. Elesa Hacker  . Hyperlipemia   . Hypertension   . Osteoporosis 07/15/1999  . Plantar fasciitis, bilateral     reports that  she has never smoked. She has never used smokeless tobacco. She reports that she does not drink alcohol or use drugs.   Current Outpatient Medications:  .  ALPRAZolam (XANAX) 0.5 MG tablet, Take 1 tablet (0.5 mg total) by mouth 3 (three) times daily as needed for anxiety., Disp: 90 tablet, Rfl: 0 .  bisacodyl (BISACODYL) 5 MG EC tablet, Take 1 tablet (5 mg total) by mouth daily as needed for moderate constipation., Disp: 30 tablet, Rfl: 0 .  methocarbamol (ROBAXIN-750) 750 MG tablet, Take 750 mg by mouth 3 (three) times daily.  , Disp: , Rfl:  .  metoprolol tartrate (LOPRESSOR) 50 MG tablet, Take 1.5 tablets (75 mg total) by mouth 2 (two) times daily., Disp: 270 tablet, Rfl: 3 .  nortriptyline (PAMELOR) 50 MG capsule, Take 3 capsules (150 mg total) by mouth at bedtime., Disp: 270 capsule, Rfl: 0 .  pantoprazole (PROTONIX) 40 MG tablet, TAKE ONE (1) TABLET BY MOUTH EVERY DAY, Disp: 90 tablet, Rfl: 1   Observations/Objective: Pulse 98, temperature 98.6 F (37 C), temperature source Oral, height 5\' 3"  (1.6 m), weight 129 lb (58.5 kg).  Physical Exam Constitutional:      General: She is not in acute distress.    Appearance: Normal appearance. She is well-developed. She is not ill-appearing or toxic-appearing.  HENT:     Head: Normocephalic.     Right Ear: Hearing, tympanic membrane, ear canal and external ear normal. Tympanic membrane is not erythematous, retracted or  bulging.     Left Ear: Hearing, tympanic membrane, ear canal and external ear normal. Tympanic membrane is not erythematous, retracted or bulging.     Nose: No mucosal edema or rhinorrhea.     Right Sinus: No maxillary sinus tenderness or frontal sinus tenderness.     Left Sinus: No maxillary sinus tenderness or frontal sinus tenderness.     Mouth/Throat:     Pharynx: Uvula midline.  Eyes:     General: Lids are normal. Lids are everted, no foreign bodies appreciated.     Conjunctiva/sclera: Conjunctivae normal.     Pupils:  Pupils are equal, round, and reactive to light.  Neck:     Musculoskeletal: Normal range of motion and neck supple.     Thyroid: No thyroid mass or thyromegaly.     Vascular: No carotid bruit.     Trachea: Trachea normal.  Cardiovascular:     Rate and Rhythm: Normal rate and regular rhythm.     Pulses: Normal pulses.     Heart sounds: Normal heart sounds, S1 normal and S2 normal. No murmur. No friction rub. No gallop.   Pulmonary:     Effort: Pulmonary effort is normal. No tachypnea or respiratory distress.     Breath sounds: Normal breath sounds. No decreased breath sounds, wheezing, rhonchi or rales.  Abdominal:     General: Bowel sounds are normal.     Palpations: Abdomen is soft.     Tenderness: There is no abdominal tenderness.  Musculoskeletal:       Feet:  Feet:     Comments: Closed puncture wound with surrounding eryyhema and pain   no ankle pain, increase warmth in anterior foot, pain with active plantar flexion of toes,  Skin:    General: Skin is warm and dry.     Findings: No rash.  Neurological:     Mental Status: She is alert.  Psychiatric:        Mood and Affect: Mood is not anxious or depressed.        Speech: Speech normal.        Behavior: Behavior normal. Behavior is cooperative.        Thought Content: Thought content normal.        Judgment: Judgment normal.      Assessment and Plan   Infected puncture wound of plantar aspect of foot, initial encounter Cocerning for infeciton... will treat with levoquin to cover pseudomonas.  Doubt foregin body or clear need for X-ray.  Pt will follow up if not improving as expected.  ICe and elevate... will go to ER if worsening over weekend.  Pt uptodate with Td.     Eliezer Lofts, MD

## 2019-04-10 ENCOUNTER — Telehealth: Payer: Self-pay | Admitting: Family Medicine

## 2019-04-10 NOTE — Telephone Encounter (Signed)
Patient called back stating she received a call in regards to her bone density scan

## 2019-04-12 ENCOUNTER — Other Ambulatory Visit: Payer: Self-pay

## 2019-04-12 ENCOUNTER — Ambulatory Visit (INDEPENDENT_AMBULATORY_CARE_PROVIDER_SITE_OTHER)
Admission: RE | Admit: 2019-04-12 | Discharge: 2019-04-12 | Disposition: A | Discharge status: Home / Self Care | Payer: Medicare Other | Source: Ambulatory Visit | Attending: Family Medicine | Admitting: Family Medicine

## 2019-04-12 DIAGNOSIS — M810 Age-related osteoporosis without current pathological fracture: Secondary | ICD-10-CM

## 2019-04-16 ENCOUNTER — Encounter: Payer: Self-pay | Admitting: *Deleted

## 2019-05-13 ENCOUNTER — Encounter: Payer: Self-pay | Admitting: Family Medicine

## 2019-05-13 ENCOUNTER — Other Ambulatory Visit: Payer: Self-pay

## 2019-05-13 ENCOUNTER — Ambulatory Visit (INDEPENDENT_AMBULATORY_CARE_PROVIDER_SITE_OTHER): Payer: Medicare Other | Admitting: Family Medicine

## 2019-05-13 VITALS — BP 120/76 | HR 89 | Temp 97.8°F | Ht 63.0 in | Wt 130.4 lb

## 2019-05-13 DIAGNOSIS — R300 Dysuria: Secondary | ICD-10-CM

## 2019-05-13 LAB — POC URINALSYSI DIPSTICK (AUTOMATED)
Bilirubin, UA: NEGATIVE
Glucose, UA: NEGATIVE
Ketones, UA: NEGATIVE
Nitrite, UA: NEGATIVE
Protein, UA: NEGATIVE
Spec Grav, UA: 1.01 (ref 1.010–1.025)
Urobilinogen, UA: 0.2 E.U./dL
pH, UA: 6 (ref 5.0–8.0)

## 2019-05-13 MED ORDER — SULFAMETHOXAZOLE-TRIMETHOPRIM 800-160 MG PO TABS: 1.0000 | ORAL_TABLET | Freq: Two times a day (BID) | ORAL | 0 refills | Status: DC

## 2019-05-13 NOTE — Patient Instructions (Signed)
Drink plenty of water and start the antibiotics today.  We'll contact you with your lab report.  Take care.  Update [place] as needed.  Glad to see you.

## 2019-05-13 NOTE — Progress Notes (Signed)
This visit occurred during the SARS-CoV-2 public health emergency.  Safety protocols were in place, including screening questions prior to the visit, additional usage of staff PPE, and extensive cleaning of exam room while observing appropriate contact time as indicated for disinfecting solutions.   Dysuria: urgency to urinate, esp at night.  Going on for about 3 nights.  Discolored urine, with odor.  Burning with urination.   duration of symptoms: 3 days.  abdominal pain: some mild lower midline abd pain.  fevers:no back pain: some mild lower back pain.   Vomiting: yes, in the last few days.    Meds, vitals, and allergies reviewed.   Per HPI unless specifically indicated in ROS section   GEN: nad, alert and oriented HEENT: ncat NECK: supple CV: rrr.  PULM: ctab, no inc wob ABD: soft, +bs, suprapubic area slightly tender EXT: no edema SKIN: no acute rash BACK: no CVA pain

## 2019-05-14 ENCOUNTER — Ambulatory Visit: Payer: Medicare Other | Admitting: Family Medicine

## 2019-05-14 DIAGNOSIS — M4608 Spinal enthesopathy, sacral and sacrococcygeal region: Secondary | ICD-10-CM | POA: Diagnosis not present

## 2019-05-14 DIAGNOSIS — Z79899 Other long term (current) drug therapy: Secondary | ICD-10-CM | POA: Diagnosis not present

## 2019-05-14 DIAGNOSIS — G894 Chronic pain syndrome: Secondary | ICD-10-CM | POA: Diagnosis not present

## 2019-05-15 DIAGNOSIS — R300 Dysuria: Secondary | ICD-10-CM | POA: Insufficient documentation

## 2019-05-15 NOTE — Assessment & Plan Note (Signed)
Likely cystitis, okay for outpatient follow-up.  Start Septra.  Check urine culture.  Urinalysis discussed with patient.  She agrees with plan.

## 2019-05-16 ENCOUNTER — Other Ambulatory Visit: Payer: Self-pay | Admitting: Family Medicine

## 2019-05-16 LAB — URINE CULTURE
MICRO NUMBER:: 1146064
SPECIMEN QUALITY:: ADEQUATE

## 2019-05-16 MED ORDER — NITROFURANTOIN MONOHYD MACRO 100 MG PO CAPS: 100.0000 mg | ORAL_CAPSULE | Freq: Two times a day (BID) | ORAL | 0 refills | Status: DC

## 2019-05-28 ENCOUNTER — Other Ambulatory Visit: Payer: Self-pay | Admitting: Family Medicine

## 2019-05-28 NOTE — Telephone Encounter (Signed)
Last office visit 05/13/2019 with Dr. Damita Dunnings for dysuria.  Last refilled 02/19/2019 for #[id number] with no refills.  No future appointments.

## 2019-07-06 ENCOUNTER — Ambulatory Visit: Payer: Medicare Other | Attending: Internal Medicine

## 2019-07-06 DIAGNOSIS — Z23 Encounter for immunization: Secondary | ICD-10-CM | POA: Insufficient documentation

## 2019-07-06 NOTE — Progress Notes (Signed)
   Covid-19 Vaccination Clinic  Name:  [name]    MRN: [id number] DOB: [birth year]  07/06/2019  [name] was observed post Covid-19 immunization for 15 minutes without incidence. She was provided with Vaccine Information Sheet and instruction to access the V-Safe system.   [name] was instructed to call 911 with any severe reactions post vaccine: [name] Difficulty breathing  . Swelling of your face and throat  . A fast heartbeat  . A bad rash all over your body  . Dizziness and weakness    Immunizations Administered    Name Date Dose VIS Date Route   Pfizer COVID-19 Vaccine 07/06/2019 11:57 AM 0.3 mL 05/24/2019 Intramuscular   Manufacturer: [employer], [employer]   Lot: [id number]   NDC: [address]-3

## 2019-07-27 ENCOUNTER — Ambulatory Visit: Payer: Medicare Other

## 2019-07-29 ENCOUNTER — Ambulatory Visit: Payer: Medicare Other | Attending: Internal Medicine

## 2019-07-29 DIAGNOSIS — Z23 Encounter for immunization: Secondary | ICD-10-CM

## 2019-07-29 NOTE — Progress Notes (Signed)
   Covid-19 Vaccination Clinic  Name:  Tiffany [name]    MRN: [id number] DOB: [birth year]  07/29/2019  [name] was observed post Covid-19 immunization for 15 minutes without incidence. She was provided with Vaccine Information Sheet and instruction to access the V-Safe system.   [name] was instructed to call 911 with any severe reactions post vaccine: [name] Difficulty breathing  . Swelling of your face and throat  . A fast heartbeat  . A bad rash all over your body  . Dizziness and weakness    Immunizations Administered    Name Date Dose VIS Date Route   Pfizer COVID-19 Vaccine 07/29/2019 11:00 AM 0.3 mL 05/24/2019 Intramuscular   Manufacturer: [employer], [employer]   Lot: [id number]   NDC: [address]-1

## 2019-08-06 ENCOUNTER — Other Ambulatory Visit: Payer: Self-pay | Admitting: Family Medicine

## 2019-08-06 NOTE — Telephone Encounter (Signed)
Last office visit 05/13/2019 with Dr. Para March for dysuria.  Last refilled 05/28/2019 for #[id number] with no refills.  No future appointments.

## 2019-08-12 DIAGNOSIS — M25511 Pain in right shoulder: Secondary | ICD-10-CM | POA: Diagnosis not present

## 2019-08-12 DIAGNOSIS — M4608 Spinal enthesopathy, sacral and sacrococcygeal region: Secondary | ICD-10-CM | POA: Diagnosis not present

## 2019-08-12 DIAGNOSIS — M19011 Primary osteoarthritis, right shoulder: Secondary | ICD-10-CM | POA: Diagnosis not present

## 2019-08-12 DIAGNOSIS — M7541 Impingement syndrome of right shoulder: Secondary | ICD-10-CM | POA: Diagnosis not present

## 2019-09-04 ENCOUNTER — Encounter: Payer: Self-pay | Admitting: General Practice

## 2019-10-01 ENCOUNTER — Ambulatory Visit: Payer: Medicare Other | Admitting: Cardiology

## 2019-10-01 ENCOUNTER — Other Ambulatory Visit: Payer: Self-pay

## 2019-10-01 ENCOUNTER — Encounter: Payer: Self-pay | Admitting: Cardiology

## 2019-10-01 VITALS — BP 124/78 | HR 85 | Temp 97.0°F | Ht 63.5 in | Wt 129.8 lb

## 2019-10-01 DIAGNOSIS — I10 Essential (primary) hypertension: Secondary | ICD-10-CM

## 2019-10-01 DIAGNOSIS — I493 Ventricular premature depolarization: Secondary | ICD-10-CM | POA: Diagnosis not present

## 2019-10-01 DIAGNOSIS — R002 Palpitations: Secondary | ICD-10-CM

## 2019-10-01 NOTE — Patient Instructions (Addendum)
Medication Instructions:  OK TO TAKE EXTRA 1/2 METOPROLOL FOR PALPITATIONS *If you need a refill on your cardiac medications before your next appointment, please call your pharmacy*  Follow-Up: Your next appointment:  12 month(s) Please call our office 2 months in advance to schedule this appointment In Person with Joni Reining, DNP, ANP 2 YEARS WITH DR Herbie Baltimore  At [facility], you and your health needs are our priority.  As part of our continuing mission to provide you with exceptional heart care, we have created designated Provider Care Teams.  These Care Teams include your primary Cardiologist (physician) and Advanced Practice Providers (APPs -  Physician Assistants and Nurse Practitioners) who all work together to provide you with the care you need, when you need it.  We recommend signing up for the patient portal called "MyChart".  Sign up information is provided on this After Visit Summary.  MyChart is used to connect with patients for Virtual Visits (Telemedicine).  Patients are able to view lab/test results, encounter notes, upcoming appointments, etc.  Non-urgent messages can be sent to your provider as well.   To learn more about what you can do with MyChart, go to [email or web address].

## 2019-10-01 NOTE — Progress Notes (Signed)
Primary Care Provider: [name], MD Cardiologist: No primary care provider on file. Electrophysiologist: None  Clinic Note: Chief Complaint  Patient presents with  . Follow-up    Doing well  . Palpitations    Notably improved.    HPI:    Tiffany [name] is a 74 y.o. female with a PMH below who presents today for 62-monthfollow-up for palpitations..  She is a former patient of Dr. RTerance Ice whom he was following for her palpitations and a reported history of mitral valve prolapse from the cardiac catheterization in 2002..[name] MVP has not been confirmed by several echocardiogram evaluations --> only noted Mild MR, No MVP. did not   Tiffany SIPLEwas last seen me in August 2019 by KJory Sims NP in September 2020 for follow-up after increasing metoprolol dose to 75 mg twice daily.  She is also noted significant anxiety during the COVID-19 pandemic.  Was tearful mourning her husband's death (2022/12/28 -> died of prolonged lateral lung cancer..  Recent Hospitalizations: n/a  Reviewed  CV studies:    The following studies were reviewed today: (if available, images/films reviewed: From Epic Chart or Care Everywhere) . None:   Interval History:   Tiffany SIMMONDSreturns here today overall doing pretty well from a cardiac standpoint.  She seems to be doing okay now having settled down some emotionally from the loss of her husband.  She really thinks that the episodes in July and August that led to having her metoprolol dose increased were probably related to stress from the last few days of her husband's life to shortly after his death.  She just had a pretty significant grief reaction.  Now she is not noticing any more palpitations.  She is really cannot member the last time she felt palpitations.  Maybe twice in the last 6 months she has had an episode where she felt her heart rate go up that lasted no more than a few seconds.  When that happens she does feel  a bit short of breath and dizzy, but it does not last long enough to make much of a difference.  Cardiovascular Review of Symptoms (Summary): no chest pain or dyspnea on exertion positive for - Rare short little bursts of tachycardia-not even once a month.  Did not last more than a minute negative for - edema, irregular heartbeat, orthopnea, paroxysmal nocturnal dyspnea, shortness of breath or Syncope/near syncope, TIA/amaurosis fugax, claudication  The patient does not have symptoms concerning for COVID-19 infection (fever, chills, cough, or new shortness of breath).  The patient is practicing social distancing & Masking.    REVIEWED OF SYSTEMS   Review of Systems  Constitutional: Negative for malaise/fatigue and weight loss.  HENT: Negative for congestion and nosebleeds.   Gastrointestinal: Negative for blood in stool and melena.  Genitourinary: Negative for hematuria.  Musculoskeletal: Positive for joint pain ("Little nagging pains").  Neurological: Positive for dizziness (Occasionally, but not associate with palpitations.  More of a balance issue.). Negative for focal weakness and weakness.  Psychiatric/Behavioral: Positive for memory loss (Mild memory issues - words get stuck. ).       Seems to be doing much better psychological standing now -handling her husband's death a little bit better.  Just trying to get accustomed to being in the house by herself.  There is a big vacuum that has not been filled.    I have reviewed and (if needed) personally updated the patient's problem list,  medications, allergies, past medical and surgical history, social and family history.   PAST MEDICAL HISTORY   Past Medical History:  Diagnosis Date  . Anxiety and depression   . Arthritis    knee  . Dupuytren's contracture of hand   . Esophageal spasm   . GERD (gastroesophageal reflux disease)   . Heart murmur   . History of MRI of cervical spine 09/98   Dr. Rolin Barry  . History of MRI of lumbar  spine 10/1998   Dr. Rolin Barry  . Hyperlipemia   . Hypertension   . Osteoporosis 07/15/1999  . Plantar fasciitis, bilateral     PAST SURGICAL HISTORY   Past Surgical History:  Procedure Laterality Date  . 2 D Echo  08/1999~04/13/2006   Mild MVP, MILD MR, Mild aortic sclerosis  . Abd ultrasound  03/02/1999   NML, no gallstones  . CARDIAC CATHETERIZATION  01/2001   Nonobstructive CAD  . ESOPHAGOGASTRODUODENOSCOPY     Sliding H. H. ~ 07/1995-11/2003 Neg  . ESOPHAGOGASTRODUODENOSCOPY N/A 08/10/2012   Procedure: ESOPHAGOGASTRODUODENOSCOPY (EGD);  Surgeon: Lafayette Dragon, MD;  Location: [facility];  Service: Endoscopy;  Laterality: N/A;  . KNEE SURGERY    . [place]  . NM LEXISCAN MYOVIEW LTD  03/26/2012   LOW RISK; no ischemia or infarction. Gut attenuation.  [name] SAVORY DILATION N/A 08/10/2012   Procedure: SAVORY DILATION;  Surgeon: Lafayette Dragon, MD;  Location: [facility];  Service: Endoscopy;  Laterality: N/A;  . SPIROMETRY  06/2004   NML  . TONSILLECTOMY AND ADENOIDECTOMY  as a child  . TRANSTHORACIC ECHOCARDIOGRAM  02/11/2011   Normal LV Size & function; ef ~42-35%; grade 1 diastolic dysfunction. MILD MAC no mitral stenosis and trace mitral regurgitation, no comment of prolapse  . TUBAL LIGATION  1975   benign tumor    MEDICATIONS/ALLERGIES   Current Meds  Medication Sig  . ALPRAZolam (XANAX) 0.5 MG tablet Take 1 tablet (0.5 mg total) by mouth 3 (three) times daily as needed for anxiety.  . bisacodyl (BISACODYL) 5 MG EC tablet Take 1 tablet (5 mg total) by mouth daily as needed for moderate constipation.  . methocarbamol (ROBAXIN-750) 750 MG tablet Take 750 mg by mouth 3 (three) times daily.    . metoprolol tartrate (LOPRESSOR) 50 MG tablet Take 1.5 tablets (75 mg total) by mouth 2 (two) times daily.  . nitrofurantoin, macrocrystal-monohydrate, (MACROBID) 100 MG capsule Take 1 capsule (100 mg total) by mouth 2 (two) times daily.  . nortriptyline (PAMELOR) 50 MG  capsule TAKE 3 CAPSULES (150 MG TOTAL) BY MOUTH AT BEDTIME.  . [DISCONTINUED] pantoprazole (PROTONIX) 40 MG tablet TAKE ONE (1) TABLET BY MOUTH EVERY DAY    Allergies  Allergen Reactions  . Aspirin Other (See Comments)    REACTION: SWELLING  . Latex Anaphylaxis    REACTION: HIVES  . Codeine     REACTION: SWELLING  . Fexofenadine     REACTION: VOMITING  . Nsaids     REACTION: SWELLING  . Tramadol     REACTION: ITCH  . Zolpidem Tartrate     REACTION: unknown    SOCIAL HISTORY/FAMILY HISTORY   Reviewed in Epic:  Pertinent findings: Widowed - January 18, 2019 - husband died - lung Ca.  Having ahard time gettingg back into routine.  Some good &some baddays.  Still inher house-grandson lives next door.   OBJCTIVE -PE, EKG, labs   Wt Readings from Last 3 Encounters:  10/01/19 129 lb 12.8 oz (  58.9 kg)  05/13/19 130 lb 6 oz (59.1 kg)  03/22/19 129 lb (58.5 kg)    Physical Exam: BP 124/78   Pulse 85   Temp (!) 97 F (36.1 C)   Ht 5' 3.5" (1.613 m)   Wt 129 lb 12.8 oz (58.9 kg)   SpO2 99%   BMI 22.63 kg/m  Physical Exam  Constitutional: She is oriented to person, place, and time. She appears well-developed and well-nourished. No distress.  Healthy-appearing.  Well-groomed.  Seems younger than stated age.  HENT:  Head: Normocephalic and atraumatic.  Neck: JVD present.  Cardiovascular: Normal rate, regular rhythm, S2 normal and intact distal pulses.  No extrasystoles are present. PMI is not displaced. Exam reveals distant heart sounds. Exam reveals no gallop and no friction rub.  No murmur heard. Pulmonary/Chest: Effort normal and breath sounds normal. No respiratory distress. She has no wheezes. She has no rales.  Musculoskeletal:        General: No edema. Normal range of motion.     Cervical back: Normal range of motion and neck supple.  Neurological: She is alert and oriented to person, place, and time.  She does have some issues occasionally getting out words that she  wants to say.  But otherwise very clear  Psychiatric: She has a normal mood and affect. Her behavior is normal. Judgment and thought content normal.  Vitals reviewed.   Adult ECG Report  Rate: 85 ;  Rhythm: normal sinus rhythm and RBBB.  Otherwise normal axis, intervals and durations.;   Narrative Interpretation: Relatively normal EKG besides the bundle branch block.  Recent Labs: I do not have any labs from there for COVID-19.  Supposedly being followed by PCP, but not available. Lab Results  Component Value Date   CHOL 198 12/28/2017   HDL 71.30 12/28/2017   LDLCALC 112 (H) 12/28/2017   LDLDIRECT 138.7 01/12/2012   TRIG 76.0 12/28/2017   CHOLHDL 3 12/28/2017   Lab Results  Component Value Date   CREATININE 0.87 01/22/2019   BUN 4 (L) 01/22/2019   NA 138 01/22/2019   K 3.6 01/22/2019   CL 100 01/22/2019   CO2 21 01/22/2019   Lab Results  Component Value Date   TSH 2.34 01/21/2016    ASSESSMENT/PLAN   Tiffany [name] is doing great.  Her palpitations have been well controlled now on current dose of metoprolol.  She is taking 75 mg twice daily.  Her blood pressure is well controlled requiring no further medications.  She has not had any prolonged spells of palpitations lasting more than a minute and is pretty much asymptomatic.  I think she is right that her flareup last summer was probably related to anxiety and stress not only from the loss of her husband but all of the COVID-19 restrictions.  She seems to be a much better state.  She has had her Covid vaccines and has her grandson keeping an eye on her.  Plan: Simply continue current dose of beta-blocker.  She will follow-up in 12 months with Jory Sims, NP and then a 2 years with me.  Problem List Items Addressed This Visit    Heart palpitations (Chronic)   Relevant Orders   EKG 12-Lead   Frequent unifocal PVCs - Primary (Chronic)   Relevant Orders   EKG 12-Lead   Essential hypertension, benign (Chronic)       COVID-19 Education: The signs and symptoms of COVID-19 were discussed with the patient and how to seek care for testing (  follow up with PCP or arrange E-visit).   The importance of social distancing and COVID-19 vaccination was discussed today.  I spent a total of 64mnutes with the patient. >  50% of the time was spent in direct patient consultation.  Additional time spent with chart review  / charting (studies, outside notes, etc): 6 Total Time: 24 min   Current medicines are reviewed at length with the patient today.  (+/- concerns) n/a  Notice: This dictation was prepared with Dragon dictation along with smaller phrase technology. Any transcriptional errors that result from this process are unintentional and may not be corrected upon review.  Patient Instructions / Medication Changes & Studies & Tests Ordered   Patient Instructions  Medication Instructions:  OK TO TAKE EXTRA 1/2 METOPROLOL FOR PALPITATIONS *If you need a refill on your cardiac medications before your next appointment, please call your pharmacy*  Follow-Up: Your next appointment:  12 month(s) Please call our office 2 months in advance to schedule this appointment In Person with KJory Sims DNP, ANP 2 YEARS WITH DR HGilman you and your health needs are our priority.  As part of our continuing mission to provide you with exceptional heart care, we have created designated Provider Care Teams.  These Care Teams include your primary Cardiologist (physician) and Advanced Practice Providers (APPs -  Physician Assistants and Nurse Practitioners) who all work together to provide you with the care you need, when you need it.  We recommend signing up for the patient portal called "MyChart".  Sign up information is provided on this After Visit Summary.  MyChart is used to connect with patients for Virtual Visits (Telemedicine).  Patients are able to view lab/test results, encounter notes, upcoming  appointments, etc.  Non-urgent messages can be sent to your provider as well.   To learn more about what you can do with MyChart, go to [email or web address]         Studies Ordered:   Orders Placed This Encounter  Procedures  . EKG 12-Lead     DGlenetta Hew M.D., M.S. Interventional Cardiologist   Pager # [phone] # [phone] [address] [place] [address]  [facility] you for choosing [facility]!

## 2019-10-02 ENCOUNTER — Other Ambulatory Visit: Payer: Self-pay | Admitting: Family Medicine

## 2019-10-03 ENCOUNTER — Encounter: Payer: Self-pay | Admitting: Cardiology

## 2019-10-08 ENCOUNTER — Emergency Department (HOSPITAL_COMMUNITY): Payer: Medicare Other

## 2019-10-08 ENCOUNTER — Other Ambulatory Visit: Payer: Self-pay

## 2019-10-08 ENCOUNTER — Emergency Department (HOSPITAL_COMMUNITY)
Admission: EM | Admit: 2019-10-08 | Discharge: 2019-10-08 | Disposition: A | Discharge status: Home / Self Care | Payer: Medicare Other | Attending: Emergency Medicine | Admitting: Emergency Medicine

## 2019-10-08 DIAGNOSIS — Z79899 Other long term (current) drug therapy: Secondary | ICD-10-CM | POA: Insufficient documentation

## 2019-10-08 DIAGNOSIS — Y999 Unspecified external cause status: Secondary | ICD-10-CM | POA: Insufficient documentation

## 2019-10-08 DIAGNOSIS — S199XXA Unspecified injury of neck, initial encounter: Secondary | ICD-10-CM | POA: Diagnosis not present

## 2019-10-08 DIAGNOSIS — I10 Essential (primary) hypertension: Secondary | ICD-10-CM | POA: Insufficient documentation

## 2019-10-08 DIAGNOSIS — Y929 Unspecified place or not applicable: Secondary | ICD-10-CM | POA: Diagnosis not present

## 2019-10-08 DIAGNOSIS — S0093XA Contusion of unspecified part of head, initial encounter: Secondary | ICD-10-CM | POA: Diagnosis not present

## 2019-10-08 DIAGNOSIS — R519 Headache, unspecified: Secondary | ICD-10-CM | POA: Diagnosis present

## 2019-10-08 DIAGNOSIS — Y939 Activity, unspecified: Secondary | ICD-10-CM | POA: Insufficient documentation

## 2019-10-08 DIAGNOSIS — S161XXA Strain of muscle, fascia and tendon at neck level, initial encounter: Secondary | ICD-10-CM

## 2019-10-08 DIAGNOSIS — M542 Cervicalgia: Secondary | ICD-10-CM | POA: Diagnosis not present

## 2019-10-08 DIAGNOSIS — G4489 Other headache syndrome: Secondary | ICD-10-CM | POA: Diagnosis not present

## 2019-10-08 DIAGNOSIS — R609 Edema, unspecified: Secondary | ICD-10-CM | POA: Diagnosis not present

## 2019-10-08 DIAGNOSIS — Z743 Need for continuous supervision: Secondary | ICD-10-CM | POA: Diagnosis not present

## 2019-10-08 DIAGNOSIS — S0003XA Contusion of scalp, initial encounter: Secondary | ICD-10-CM | POA: Diagnosis not present

## 2019-10-08 MED ORDER — ACETAMINOPHEN 500 MG PO TABS
1000.0000 mg | ORAL_TABLET | Freq: Once | ORAL | Status: AC
  Administered 2019-10-08: 1000 mg via ORAL
  Filled 2019-10-08: qty 2

## 2019-10-08 MED ORDER — HYDROCODONE-ACETAMINOPHEN 5-325 MG PO TABS: 1.0000 | ORAL_TABLET | ORAL | 0 refills | Status: DC | PRN

## 2019-10-08 MED ORDER — HYDROCODONE-ACETAMINOPHEN 5-325 MG PO TABS
1.0000 | ORAL_TABLET | Freq: Once | ORAL | Status: AC
  Administered 2019-10-08: 18:00:00 1 via ORAL
  Filled 2019-10-08: qty 1

## 2019-10-08 NOTE — ED Provider Notes (Signed)
Pt signed out by Dr. Anitra Lauth pending CT scans.   IMPRESSION:  CT of the head: No acute intracranial abnormality noted.    Mild scalp hematoma posteriorly consistent with the recent injury.    CT of the cervical spine: Multilevel degenerative change without  acute abnormality.   Pt is stable for d/c.  Return if worse.   Jacalyn Lefevre, MD 10/08/19 1806
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
## 2019-10-10 ENCOUNTER — Ambulatory Visit (INDEPENDENT_AMBULATORY_CARE_PROVIDER_SITE_OTHER): Payer: Medicare Other | Admitting: Family Medicine

## 2019-10-10 ENCOUNTER — Other Ambulatory Visit: Payer: Self-pay

## 2019-10-10 ENCOUNTER — Encounter: Payer: Self-pay | Admitting: Family Medicine

## 2019-10-10 DIAGNOSIS — S0990XA Unspecified injury of head, initial encounter: Secondary | ICD-10-CM | POA: Diagnosis not present

## 2019-10-10 DIAGNOSIS — S46812A Strain of other muscles, fascia and tendons at shoulder and upper arm level, left arm, initial encounter: Secondary | ICD-10-CM | POA: Insufficient documentation

## 2019-10-10 NOTE — Assessment & Plan Note (Signed)
Tylenol, home PT, heat  and muscle relaxnt prn
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
## 2019-10-10 NOTE — Assessment & Plan Note (Signed)
Possible mild concussion.. improving. Continue brain rest.

## 2019-10-10 NOTE — Patient Instructions (Signed)
Possible mild concussion.. start brain and physical rest until headache resolved.  Continue tylenol 1000 mg three times day  As needed.  Continue robaxin.  Continue neck range of motion exercise and massage and heat on neck.
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
## 2019-11-12 DIAGNOSIS — M4608 Spinal enthesopathy, sacral and sacrococcygeal region: Secondary | ICD-10-CM | POA: Diagnosis not present

## 2019-11-12 DIAGNOSIS — M19011 Primary osteoarthritis, right shoulder: Secondary | ICD-10-CM | POA: Diagnosis not present

## 2019-11-12 DIAGNOSIS — M25511 Pain in right shoulder: Secondary | ICD-10-CM | POA: Diagnosis not present

## 2019-11-12 DIAGNOSIS — M7541 Impingement syndrome of right shoulder: Secondary | ICD-10-CM | POA: Diagnosis not present

## 2019-12-10 ENCOUNTER — Other Ambulatory Visit: Payer: Self-pay | Admitting: Family Medicine

## 2019-12-10 DIAGNOSIS — M7581 Other shoulder lesions, right shoulder: Secondary | ICD-10-CM | POA: Diagnosis not present
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
## 2020-02-10 DIAGNOSIS — H40013 Open angle with borderline findings, low risk, bilateral: Secondary | ICD-10-CM | POA: Diagnosis not present

## 2020-02-10 DIAGNOSIS — H35373 Puckering of macula, bilateral: Secondary | ICD-10-CM | POA: Diagnosis not present

## 2020-02-10 DIAGNOSIS — H353131 Nonexudative age-related macular degeneration, bilateral, early dry stage: Secondary | ICD-10-CM | POA: Diagnosis not present

## 2020-02-10 DIAGNOSIS — H04123 Dry eye syndrome of bilateral lacrimal glands: Secondary | ICD-10-CM | POA: Diagnosis not present

## 2020-02-10 DIAGNOSIS — H26493 Other secondary cataract, bilateral: Secondary | ICD-10-CM | POA: Diagnosis not present

## 2020-02-12 ENCOUNTER — Other Ambulatory Visit (HOSPITAL_COMMUNITY): Payer: Self-pay | Admitting: Anesthesiology

## 2020-02-12 DIAGNOSIS — M19011 Primary osteoarthritis, right shoulder: Secondary | ICD-10-CM | POA: Diagnosis not present

## 2020-02-12 DIAGNOSIS — M7541 Impingement syndrome of right shoulder: Secondary | ICD-10-CM | POA: Diagnosis not present

## 2020-02-12 DIAGNOSIS — M25511 Pain in right shoulder: Secondary | ICD-10-CM | POA: Diagnosis not present

## 2020-02-12 DIAGNOSIS — M4608 Spinal enthesopathy, sacral and sacrococcygeal region: Secondary | ICD-10-CM | POA: Diagnosis not present

## 2020-02-28 DIAGNOSIS — M1711 Unilateral primary osteoarthritis, right knee: Secondary | ICD-10-CM | POA: Diagnosis not present

## 2020-03-10 ENCOUNTER — Other Ambulatory Visit: Payer: Self-pay | Admitting: Family Medicine
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
## 2020-03-19 DIAGNOSIS — S46011A Strain of muscle(s) and tendon(s) of the rotator cuff of right shoulder, initial encounter: Secondary | ICD-10-CM | POA: Diagnosis not present

## 2020-04-27 ENCOUNTER — Ambulatory Visit (INDEPENDENT_AMBULATORY_CARE_PROVIDER_SITE_OTHER)
Admission: RE | Admit: 2020-04-27 | Discharge: 2020-04-27 | Disposition: A | Discharge status: Home / Self Care | Payer: Medicare Other | Source: Ambulatory Visit | Attending: Family Medicine | Admitting: Family Medicine

## 2020-04-27 ENCOUNTER — Encounter: Payer: Self-pay | Admitting: Family Medicine

## 2020-04-27 ENCOUNTER — Other Ambulatory Visit: Payer: Self-pay

## 2020-04-27 ENCOUNTER — Telehealth: Payer: Self-pay

## 2020-04-27 ENCOUNTER — Ambulatory Visit (INDEPENDENT_AMBULATORY_CARE_PROVIDER_SITE_OTHER): Payer: Medicare Other | Admitting: Family Medicine

## 2020-04-27 ENCOUNTER — Other Ambulatory Visit: Payer: Self-pay | Admitting: Family Medicine

## 2020-04-27 VITALS — BP 90/60 | HR 60 | Temp 97.4°F | Ht 63.0 in | Wt 131.5 lb

## 2020-04-27 DIAGNOSIS — R1031 Right lower quadrant pain: Secondary | ICD-10-CM

## 2020-04-27 DIAGNOSIS — R1030 Lower abdominal pain, unspecified: Secondary | ICD-10-CM | POA: Diagnosis not present

## 2020-04-27 DIAGNOSIS — R1032 Left lower quadrant pain: Secondary | ICD-10-CM

## 2020-04-27 DIAGNOSIS — R109 Unspecified abdominal pain: Secondary | ICD-10-CM | POA: Diagnosis not present

## 2020-04-27 LAB — CBC WITH DIFFERENTIAL/PLATELET
Basophils Absolute: 0.1 10*3/uL (ref 0.0–0.1)
Basophils Relative: 1 % (ref 0.0–3.0)
Eosinophils Absolute: 0.3 10*3/uL (ref 0.0–0.7)
Eosinophils Relative: 2.7 % (ref 0.0–5.0)
HCT: 39.2 % (ref 36.0–46.0)
Hemoglobin: 12.7 g/dL (ref 12.0–15.0)
Lymphocytes Relative: 18.8 % (ref 12.0–46.0)
Lymphs Abs: 2 10*3/uL (ref 0.7–4.0)
MCHC: 32.5 g/dL (ref 30.0–36.0)
MCV: 94.2 fl (ref 78.0–100.0)
Monocytes Absolute: 1 10*3/uL (ref 0.1–1.0)
Monocytes Relative: 9.3 % (ref 3.0–12.0)
Neutro Abs: 7.1 10*3/uL (ref 1.4–7.7)
Neutrophils Relative %: 68.2 % (ref 43.0–77.0)
Platelets: 324 10*3/uL (ref 150.0–400.0)
RBC: 4.16 Mil/uL (ref 3.87–5.11)
RDW: 13.5 % (ref 11.5–15.5)
WBC: 10.4 10*3/uL (ref 4.0–10.5)

## 2020-04-27 LAB — POC URINALSYSI DIPSTICK (AUTOMATED)
Bilirubin, UA: NEGATIVE
Blood, UA: NEGATIVE
Glucose, UA: NEGATIVE
Ketones, UA: NEGATIVE
Nitrite, UA: NEGATIVE
Protein, UA: POSITIVE — AB
Spec Grav, UA: 1.015 (ref 1.010–1.025)
Urobilinogen, UA: 0.2 E.U./dL
pH, UA: 7 (ref 5.0–8.0)

## 2020-04-27 LAB — LIPASE: Lipase: 12 U/L (ref 11.0–59.0)

## 2020-04-27 LAB — BASIC METABOLIC PANEL
BUN: 5 mg/dL — ABNORMAL LOW (ref 6–23)
CO2: 33 mEq/L — ABNORMAL HIGH (ref 19–32)
Calcium: 9.2 mg/dL (ref 8.4–10.5)
Chloride: 99 mEq/L (ref 96–112)
Creatinine, Ser: 0.98 mg/dL (ref 0.40–1.20)
GFR: 56.23 mL/min — ABNORMAL LOW (ref >60.00)
Glucose, Bld: 90 mg/dL (ref 70–99)
Potassium: 4.7 mEq/L (ref 3.5–5.1)
Sodium: 137 mEq/L (ref 135–145)

## 2020-04-27 LAB — HEPATIC FUNCTION PANEL
ALT: 7 U/L (ref 0–35)
AST: 14 U/L (ref 0–37)
Albumin: 4 g/dL (ref 3.5–5.2)
Alkaline Phosphatase: 88 U/L (ref 39–117)
Bilirubin, Direct: 0 mg/dL (ref 0.0–0.3)
Total Bilirubin: 0.3 mg/dL (ref 0.2–1.2)
Total Protein: 6.9 g/dL (ref 6.0–8.3)

## 2020-04-27 MED ORDER — IOHEXOL 300 MG/ML  SOLN
100.0000 mL | Freq: Once | INTRAMUSCULAR | Status: AC | PRN
  Administered 2020-04-27: 100 mL via INTRAVENOUS

## 2020-04-27 MED ORDER — CIPROFLOXACIN HCL 500 MG PO TABS
500.0000 mg | ORAL_TABLET | Freq: Two times a day (BID) | ORAL | 0 refills | Status: DC
Start: 2020-04-27 — End: 2020-04-27

## 2020-04-27 MED ORDER — METRONIDAZOLE 500 MG PO TABS
500.0000 mg | ORAL_TABLET | Freq: Three times a day (TID) | ORAL | 0 refills | Status: DC
Start: 2020-04-27 — End: 2020-04-27

## 2020-04-27 NOTE — Telephone Encounter (Signed)
Pt already arrived for appt with Dr Patsy Lager 04/27/20 at 11 AM.
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
## 2020-04-27 NOTE — Telephone Encounter (Signed)
[facility] - Client TELEPHONE ADVICE RECORD [facility] Patient Name: Tiffany [name] Gender: Female DOB: [birth year] Age: 74 Y 1 M Return Phone Number: [phone] (Primary) Address: City/State/Zip: [place] [address] Client [facility] - Client Client Site [facility] - Day Physician Kerby Nora - MD Contact Type Call Who Is Calling Patient / Member / Family / Caregiver Call Type Triage / Clinical Relationship To Patient Self Return Phone Number [phone] (Primary) Chief Complaint ABDOMINAL PAIN - Severe and only in abdomen Reason for Call Symptomatic / Request for Health Information Initial Comment Caller is having abdominal pain in her lower abdomen 10/10. Caller said she has an appt this morning at 11AM. Caller was transferred by the clinic to get directives until her appt today. Translation No Nurse Assessment Nurse: Doylene Canard, RN, Lesa Date/Time (Eastern Time): 04/27/2020 9:18:22 AM Confirm and document reason for call. If symptomatic, describe symptoms. ---Caller states she has lower abdominal pain Does the patient have any new or worsening symptoms? ---Yes Will a triage be completed? ---Yes Related visit to physician within the last 2 weeks? ---No Does the PT have any chronic conditions? (i.e. diabetes, asthma, this includes High risk factors for pregnancy, etc.) ---No Is this a behavioral health or substance abuse call? ---No Guidelines Guideline Title Affirmed Question Affirmed Notes Nurse Date/Time Lamount Cohen Time) Abdominal Pain - Female [1] SEVERE pain (e.g., excruciating) AND [2] present > 1 hour Conner, RN, Lesa 04/27/2020 9:19:09 AM Disp. Time Lamount Cohen Time) Disposition Final User 04/27/2020 9:16:34 AM Send to Urgent Queue Gerrie Nordmann 04/27/2020 9:20:33 AM Go to ED Now Yes Doylene Canard, RN, Gean Maidens Caller Disagree/Comply Disagree Caller Understands Yes PLEASE NOTE: All  timestamps contained within this report are represented as [place] Standard Time. CONFIDENTIALTY NOTICE: This fax transmission is intended only for the addressee. It contains information that is legally privileged, confidential or otherwise protected from use or disclosure. If you are not the intended recipient, you are strictly prohibited from reviewing, disclosing, copying using or disseminating any of this information or taking any action in reliance on or regarding this information. If you have received this fax in error, please notify us immediately by telephone so that we can arrange for its return to [place]. Phone: [phone], Toll-Free: [phone], Fax: [phone] Page: 2 of 2 Call Id: [id number] PreDisposition Did not know what to do Care Advice Given Per Guideline GO TO ED NOW: * You need to be seen in the Emergency Department. * Go to the ED at ___________ Hospital. * Leave now. Drive carefully. ANOTHER ADULT SHOULD DRIVE: * It is better and safer if another adult drives instead of you. BRING MEDICINES: * Bring the pill bottles too. This will help the doctor (or NP/PA) to make certain you are taking the right medicines and the right dose. NOTHING BY MOUTH: * Do not eat or drink anything for now. * Reason: condition may need surgery and general anesthesia. CARE ADVICE given per Abdominal Pain, Female (Adult) guideline. Referrals GO TO FACILITY REFUSED

## 2020-04-27 NOTE — Addendum Note (Signed)
Addended by: Hannah Beat on: 04/27/2020 04:37 PM   Modules accepted: Orders

## 2020-04-28 LAB — URINE CULTURE
MICRO NUMBER:: 11203709
SPECIMEN QUALITY:: ADEQUATE

## 2020-04-29 ENCOUNTER — Other Ambulatory Visit: Payer: Self-pay

## 2020-04-29 ENCOUNTER — Other Ambulatory Visit: Payer: Self-pay | Admitting: Anesthesiology

## 2020-04-29 ENCOUNTER — Ambulatory Visit
Admission: RE | Admit: 2020-04-29 | Discharge: 2020-04-29 | Disposition: A | Discharge status: Home / Self Care | Payer: Medicare Other | Source: Ambulatory Visit | Attending: Anesthesiology | Admitting: Anesthesiology

## 2020-04-29 DIAGNOSIS — M4608 Spinal enthesopathy, sacral and sacrococcygeal region: Secondary | ICD-10-CM | POA: Diagnosis not present

## 2020-04-29 DIAGNOSIS — R52 Pain, unspecified: Secondary | ICD-10-CM

## 2020-04-29 DIAGNOSIS — M19011 Primary osteoarthritis, right shoulder: Secondary | ICD-10-CM | POA: Diagnosis not present

## 2020-04-29 DIAGNOSIS — M25511 Pain in right shoulder: Secondary | ICD-10-CM | POA: Diagnosis not present

## 2020-04-29 DIAGNOSIS — M7541 Impingement syndrome of right shoulder: Secondary | ICD-10-CM | POA: Diagnosis not present

## 2020-05-01 ENCOUNTER — Ambulatory Visit (INDEPENDENT_AMBULATORY_CARE_PROVIDER_SITE_OTHER): Payer: Medicare Other | Admitting: Family Medicine

## 2020-05-01 ENCOUNTER — Encounter: Payer: Self-pay | Admitting: Family Medicine

## 2020-05-01 ENCOUNTER — Other Ambulatory Visit: Payer: Self-pay

## 2020-05-01 VITALS — BP 130/80 | HR 121 | Temp 97.6°F | Ht 63.0 in | Wt 129.8 lb

## 2020-05-01 DIAGNOSIS — R109 Unspecified abdominal pain: Secondary | ICD-10-CM | POA: Diagnosis not present

## 2020-05-01 DIAGNOSIS — K5909 Other constipation: Secondary | ICD-10-CM

## 2020-05-01 NOTE — Patient Instructions (Addendum)
Hold Flagyl, continue Cipro twice daily. Start twice daily miralax.  Keep up with the fluids. Consider eating prunes or prune juice.  Continue dulcolax.  If no BM in next 1-2 days.[name] add enema or milk of magnesia.  Can use tylenol for pain. Call if not improving or NO BM by early next week.

## 2020-05-01 NOTE — Assessment & Plan Note (Signed)
Chronic problem for pt.   Linzess overtreated and cause diarrhea in past.

## 2020-05-01 NOTE — Progress Notes (Signed)
Chief Complaint  Patient presents with  . Follow-up    Abdominal Pain    History of Present Illness: HPI  74 year old female presents for follow up abdominal pain.   She saw Dr. Patsy Lager on 04/27/2020 for acute right lower abd pain Note reviewed in detail. Lab eval was unremarkable. Urine culture negative, normal lipase, cbc, CMET  Dr. Patsy Lager stated: "CT has returned with aortic calcification, but no clear appendicitis or diverticulitis. I am going to treat clinically as if early colitis or diverticulitis with cipro and flagyl with follow-up."  Today she reports no change in  Pain... now associated with no BM and nausea. Has tried 2 tabs of dulcolax.   Pain on scale of 6/10.  She has decreased appetite, nausea. Eating minimally.[name] keeping up with water ( 64 oz a day). She is on day 3 of antibiotics.  Last Friday was last BM... was normal for her.    Has history of constipation. On daily dulcolax.    Today she is on day 4 of antibiotics.  This visit occurred during the SARS-CoV-2 public health emergency.  Safety protocols were in place, including screening questions prior to the visit, additional usage of staff PPE, and extensive cleaning of exam room while observing appropriate contact time as indicated for disinfecting solutions.   COVID 19 screen:  No recent travel or known exposure to COVID19 The patient denies respiratory symptoms of COVID 19 at this time. The importance of social distancing was discussed today.     Review of Systems  Constitutional: Negative for chills and fever.  HENT: Negative for congestion and ear pain.   Eyes: Negative for pain and redness.  Respiratory: Negative for cough and shortness of breath.   Cardiovascular: Negative for chest pain, palpitations and leg swelling.  Gastrointestinal: Negative for abdominal pain, blood in stool, constipation, diarrhea, nausea and vomiting.  Genitourinary: Negative for dysuria.  Musculoskeletal: Negative for  falls and myalgias.  Skin: Negative for rash.  Neurological: Negative for dizziness.  Psychiatric/Behavioral: Negative for depression. The patient is not nervous/anxious.       Past Medical History:  Diagnosis Date  . Anxiety and depression   . Arthritis    knee  . Dupuytren's contracture of hand   . Esophageal spasm   . GERD (gastroesophageal reflux disease)   . Heart murmur   . History of MRI of cervical spine 09/98   Dr. Elesa Hacker  . History of MRI of lumbar spine 10/1998   Dr. Elesa Hacker  . Hyperlipemia   . Hypertension   . Osteoporosis 07/15/1999  . Plantar fasciitis, bilateral     reports that she has never smoked. She has never used smokeless tobacco. She reports that she does not drink alcohol and does not use drugs.   Current Outpatient Medications:  .  ALPRAZolam (XANAX) 0.5 MG tablet, Take 1 tablet (0.5 mg total) by mouth 3 (three) times daily as needed for anxiety., Disp: 90 tablet, Rfl: 0 .  bisacodyl (BISACODYL) 5 MG EC tablet, Take 1 tablet (5 mg total) by mouth daily as needed for moderate constipation., Disp: 30 tablet, Rfl: 0 .  Cholecalciferol (VITAMIN D3 PO), Take 1 tablet by mouth daily., Disp: , Rfl:  .  ciprofloxacin (CIPRO) 500 MG tablet, Take 1 tablet (500 mg total) by mouth 2 (two) times daily for 10 days., Disp: 20 tablet, Rfl: 0 .  methocarbamol (ROBAXIN-750) 750 MG tablet, Take 750 mg by mouth 3 (three) times daily.  , Disp: , Rfl:  .  metoprolol tartrate (LOPRESSOR) 50 MG tablet, Take 1.5 tablets (75 mg total) by mouth 2 (two) times daily., Disp: 270 tablet, Rfl: 3 .  metroNIDAZOLE (FLAGYL) 500 MG tablet, Take 1 tablet (500 mg total) by mouth 3 (three) times daily for 10 days., Disp: 30 tablet, Rfl: 0 .  nortriptyline (PAMELOR) 50 MG capsule, TAKE 3 CAPSULES (150 MG TOTAL) BY MOUTH AT BEDTIME., Disp: 270 capsule, Rfl: 0 .  pantoprazole (PROTONIX) 40 MG tablet, TAKE ONE (1) TABLET BY MOUTH EVERY DAY, Disp: 90 tablet, Rfl: 1 .  vitamin B-12 (CYANOCOBALAMIN)  1000 MCG tablet, Take 1,000 mcg by mouth daily., Disp: , Rfl:    Observations/Objective: Blood pressure 130/80, pulse (!) 121, temperature 97.6 F (36.4 C), temperature source Temporal, height 5\' 3"  (1.6 m), weight 129 lb 12 oz (58.9 kg), SpO2 98 %.  Physical Exam Constitutional:      General: She is not in acute distress.    Appearance: Normal appearance. She is well-developed. She is not ill-appearing or toxic-appearing.  HENT:     Head: Normocephalic.     Right Ear: Hearing, tympanic membrane, ear canal and external ear normal. Tympanic membrane is not erythematous, retracted or bulging.     Left Ear: Hearing, tympanic membrane, ear canal and external ear normal. Tympanic membrane is not erythematous, retracted or bulging.     Nose: No mucosal edema or rhinorrhea.     Right Sinus: No maxillary sinus tenderness or frontal sinus tenderness.     Left Sinus: No maxillary sinus tenderness or frontal sinus tenderness.     Mouth/Throat:     Pharynx: Uvula midline.  Eyes:     General: Lids are normal. Lids are everted, no foreign bodies appreciated.     Conjunctiva/sclera: Conjunctivae normal.     Pupils: Pupils are equal, round, and reactive to light.  Neck:     Thyroid: No thyroid mass or thyromegaly.     Vascular: No carotid bruit.     Trachea: Trachea normal.  Cardiovascular:     Rate and Rhythm: Normal rate and regular rhythm.     Pulses: Normal pulses.     Heart sounds: Normal heart sounds, S1 normal and S2 normal. No murmur heard.  No friction rub. No gallop.   Pulmonary:     Effort: Pulmonary effort is normal. No tachypnea or respiratory distress.     Breath sounds: Normal breath sounds. No decreased breath sounds, wheezing, rhonchi or rales.  Abdominal:     General: Bowel sounds are normal.     Palpations: Abdomen is soft.     Tenderness: There is generalized abdominal tenderness. There is no right CVA tenderness, left CVA tenderness, guarding or rebound.     Comments:   Greatest pain at  Supra pubic area and LLQ  Musculoskeletal:     Cervical back: Normal range of motion and neck supple.  Skin:    General: Skin is warm and dry.     Findings: No rash.  Neurological:     Mental Status: She is alert.  Psychiatric:        Mood and Affect: Mood is not anxious or depressed.        Speech: Speech normal.        Behavior: Behavior normal. Behavior is cooperative.        Thought Content: Thought content normal.        Judgment: Judgment normal.      Assessment and Plan Acute abdominal pain  Unremarkable labs and CT  scan.  Pt with likely nausea from antibitoics .  NO BM in 1 week... constipation most likely culprit of cause of pain.   Treat constipation see pt instructions for recs.   Will stop flagyl given nausea and no clear improvement. Continue cipro just in case to cover early colitis/ infection as Dr. Patsy Lager was concerned about.  Other constipation Chronic problem for pt.   Linzess overtreated and cause diarrhea in past.       Kerby Nora, MD

## 2020-05-01 NOTE — Assessment & Plan Note (Signed)
Unremarkable labs and CT scan.  Pt with likely nausea from antibitoics .  NO BM in 1 week... constipation most likely culprit of cause of pain.   Treat constipation see pt instructions for recs.   Will stop flagyl given nausea and no clear improvement. Continue cipro just in case to cover early colitis/ infection as Dr. Patsy Lager was concerned about.

## 2020-05-13 ENCOUNTER — Telehealth: Payer: Self-pay | Admitting: Family Medicine

## 2020-05-13 ENCOUNTER — Other Ambulatory Visit: Payer: Self-pay | Admitting: Family Medicine

## 2020-05-13 DIAGNOSIS — M7541 Impingement syndrome of right shoulder: Secondary | ICD-10-CM | POA: Diagnosis not present

## 2020-05-13 DIAGNOSIS — M25511 Pain in right shoulder: Secondary | ICD-10-CM | POA: Diagnosis not present

## 2020-05-13 DIAGNOSIS — M19011 Primary osteoarthritis, right shoulder: Secondary | ICD-10-CM | POA: Diagnosis not present

## 2020-05-13 DIAGNOSIS — M4608 Spinal enthesopathy, sacral and sacrococcygeal region: Secondary | ICD-10-CM | POA: Diagnosis not present

## 2020-05-13 NOTE — Telephone Encounter (Signed)
Call left message for patient to call the office . °

## 2020-05-13 NOTE — Telephone Encounter (Signed)
Please call and schedule MWV with nurse and CPE with fasting labs prior.

## 2020-05-21 NOTE — Telephone Encounter (Signed)
Spoke with patient scheduled MWV and labs

## 2020-05-27 DIAGNOSIS — M7541 Impingement syndrome of right shoulder: Secondary | ICD-10-CM | POA: Diagnosis not present

## 2020-05-27 DIAGNOSIS — M25511 Pain in right shoulder: Secondary | ICD-10-CM | POA: Diagnosis not present

## 2020-05-27 DIAGNOSIS — M4608 Spinal enthesopathy, sacral and sacrococcygeal region: Secondary | ICD-10-CM | POA: Diagnosis not present

## 2020-05-27 DIAGNOSIS — M19011 Primary osteoarthritis, right shoulder: Secondary | ICD-10-CM | POA: Diagnosis not present

## 2020-05-28 ENCOUNTER — Other Ambulatory Visit: Payer: Self-pay | Admitting: Anesthesiology

## 2020-05-28 DIAGNOSIS — M25519 Pain in unspecified shoulder: Secondary | ICD-10-CM

## 2020-06-02 ENCOUNTER — Ambulatory Visit
Admission: RE | Admit: 2020-06-02 | Discharge: 2020-06-02 | Disposition: A | Discharge status: Home / Self Care | Payer: Medicare Other | Source: Ambulatory Visit | Attending: Anesthesiology | Admitting: Anesthesiology

## 2020-06-02 ENCOUNTER — Other Ambulatory Visit: Payer: Self-pay

## 2020-06-02 DIAGNOSIS — M25519 Pain in unspecified shoulder: Secondary | ICD-10-CM

## 2020-06-02 DIAGNOSIS — M25511 Pain in right shoulder: Secondary | ICD-10-CM | POA: Diagnosis not present

## 2020-06-08 ENCOUNTER — Other Ambulatory Visit: Payer: Self-pay | Admitting: Family Medicine

## 2020-06-08 NOTE — Telephone Encounter (Signed)
Last office visit 05/01/2020 for follow up abdominal pain.  Last refilled 03/11/2020 for #[id number] with no refills.  CPE scheduled 07/03/2020.

## 2020-06-22 ENCOUNTER — Telehealth: Payer: Self-pay | Admitting: Family Medicine

## 2020-06-22 DIAGNOSIS — E7849 Other hyperlipidemia: Secondary | ICD-10-CM

## 2020-06-22 DIAGNOSIS — Z862 Personal history of diseases of the blood and blood-forming organs and certain disorders involving the immune mechanism: Secondary | ICD-10-CM

## 2020-06-22 NOTE — Telephone Encounter (Signed)
-----   Message from Tamera L Johnson, RT sent at 06/08/2020  4:10 PM EST ----- Regarding: Lab Orders for Friday 1.14.2022 Please place lab orders for Friday 1.14.2022, office visit for physical on Friday 1.21.2022 Thank you, Tamera Johnson RT(R)       

## 2020-06-25 ENCOUNTER — Other Ambulatory Visit (HOSPITAL_COMMUNITY): Payer: Self-pay | Admitting: Anesthesiology

## 2020-06-25 DIAGNOSIS — M25511 Pain in right shoulder: Secondary | ICD-10-CM | POA: Diagnosis not present

## 2020-06-25 DIAGNOSIS — M4608 Spinal enthesopathy, sacral and sacrococcygeal region: Secondary | ICD-10-CM | POA: Diagnosis not present

## 2020-06-25 DIAGNOSIS — M19011 Primary osteoarthritis, right shoulder: Secondary | ICD-10-CM | POA: Diagnosis not present

## 2020-06-25 DIAGNOSIS — M7541 Impingement syndrome of right shoulder: Secondary | ICD-10-CM | POA: Diagnosis not present

## 2020-06-26 ENCOUNTER — Other Ambulatory Visit: Payer: Self-pay

## 2020-06-26 ENCOUNTER — Other Ambulatory Visit (INDEPENDENT_AMBULATORY_CARE_PROVIDER_SITE_OTHER): Payer: Medicare Other

## 2020-06-26 DIAGNOSIS — E7849 Other hyperlipidemia: Secondary | ICD-10-CM

## 2020-06-26 LAB — LIPID PANEL
Cholesterol: 199 mg/dL (ref 0–200)
HDL: 66.6 mg/dL (ref >39.00)
LDL Cholesterol: 112 mg/dL — ABNORMAL HIGH (ref 0–99)
NonHDL: 132.4
Total CHOL/HDL Ratio: 3
Triglycerides: 102 mg/dL (ref 0.0–149.0)
VLDL: 20.4 mg/dL (ref 0.0–40.0)

## 2020-06-26 LAB — COMPREHENSIVE METABOLIC PANEL
ALT: 7 U/L (ref 0–35)
AST: 14 U/L (ref 0–37)
Albumin: 4.1 g/dL (ref 3.5–5.2)
Alkaline Phosphatase: 87 U/L (ref 39–117)
BUN: 8 mg/dL (ref 6–23)
CO2: 30 mEq/L (ref 19–32)
Calcium: 9.1 mg/dL (ref 8.4–10.5)
Chloride: 100 mEq/L (ref 96–112)
Creatinine, Ser: 1.02 mg/dL (ref 0.40–1.20)
GFR: 53.54 mL/min — ABNORMAL LOW (ref >60.00)
Glucose, Bld: 90 mg/dL (ref 70–99)
Potassium: 4.1 mEq/L (ref 3.5–5.1)
Sodium: 137 mEq/L (ref 135–145)
Total Bilirubin: 0.3 mg/dL (ref 0.2–1.2)
Total Protein: 6.6 g/dL (ref 6.0–8.3)

## 2020-06-26 NOTE — Progress Notes (Signed)
No critical labs need to be addressed urgently. We will discuss labs in detail at upcoming office visit.   

## 2020-07-03 ENCOUNTER — Ambulatory Visit: Payer: Medicare Other | Admitting: Family Medicine

## 2020-07-16 ENCOUNTER — Other Ambulatory Visit: Payer: Self-pay | Admitting: Family Medicine

## 2020-07-16 ENCOUNTER — Encounter: Payer: Self-pay | Admitting: Family Medicine

## 2020-07-16 ENCOUNTER — Ambulatory Visit (INDEPENDENT_AMBULATORY_CARE_PROVIDER_SITE_OTHER): Payer: Medicare Other | Admitting: Family Medicine

## 2020-07-16 ENCOUNTER — Other Ambulatory Visit: Payer: Self-pay

## 2020-07-16 VITALS — BP 110/60 | HR 69 | Temp 98.1°F | Ht 63.5 in | Wt 126.5 lb

## 2020-07-16 DIAGNOSIS — E7849 Other hyperlipidemia: Secondary | ICD-10-CM | POA: Diagnosis not present

## 2020-07-16 DIAGNOSIS — I10 Essential (primary) hypertension: Secondary | ICD-10-CM | POA: Diagnosis not present

## 2020-07-16 DIAGNOSIS — Z0000 Encounter for general adult medical examination without abnormal findings: Secondary | ICD-10-CM | POA: Diagnosis not present

## 2020-07-16 DIAGNOSIS — I700 Atherosclerosis of aorta: Secondary | ICD-10-CM | POA: Diagnosis not present

## 2020-07-16 DIAGNOSIS — F331 Major depressive disorder, recurrent, moderate: Secondary | ICD-10-CM | POA: Diagnosis not present

## 2020-07-16 MED ORDER — ATORVASTATIN CALCIUM 10 MG PO TABS
ORAL_TABLET | ORAL | 11 refills | Status: DC
Start: 2020-07-16 — End: 2020-07-30

## 2020-07-16 NOTE — Assessment & Plan Note (Signed)
Statin indicated and initiated.

## 2020-07-16 NOTE — Patient Instructions (Signed)
Start low dose cholesterol medication. Return for lab check in three month to check on cholesterol.

## 2020-07-16 NOTE — Assessment & Plan Note (Signed)
Stable, chronic.  Continue current medication.   Metoprolol 75 mg daily

## 2020-07-16 NOTE — Progress Notes (Signed)
Patient ID: Tiffany [name], female    DOB: 05/25/1946, 75 y.o.   MRN: [id number]  This visit was conducted in person.  BP 110/60   Pulse 69   Temp 98.1 F (36.7 C) (Temporal)   Ht 5' 3.5" (1.613 m)   Wt 126 lb 8 oz (57.4 kg)   SpO2 99%   BMI 22.06 kg/m    CC:  Chief Complaint  Patient presents with  . Medicare Wellness    Subjective:   HPI: Tiffany [name] is a 75 y.o. female presenting on 07/16/2020 for Medicare Wellness  The patient presents for annual medicare wellness, complete physical and review of chronic health problems. He/She also has the following acute concerns today:  I have personally reviewed the Medicare Annual Wellness questionnaire and have noted 1. The patient's medical and social history 2. Their use of alcohol, tobacco or illicit drugs 3. Their current medications and supplements 4. The patient's functional ability including ADL's, fall risks, home safety risks and hearing or visual             impairment. 5. Diet and physical activities 6. Evidence for depression or mood disorders 7.         Updated provider list Cognitive evaluation was performed and recorded on pt medicare questionnaire form. The patients weight, height, BMI and visual acuity have been recorded in the chart  I have made referrals, counseling and provided education to the patient based review of the above and I have provided the pt with a written personalized care plan for preventive services.   Documentation of this information was scanned into the electronic record under the media tab.   Advance directives and end of life planning reviewed in detail with patient and documented in EMR. Patient given handout on advance care directives if needed. HCPOA and living will updated if needed.   Hearing Screening   Method: Audiometry   125Hz  250Hz  500Hz  1000Hz  2000Hz  3000Hz  4000Hz  6000Hz  8000Hz   Right ear:   25 0 20  0    Left ear:   0 0 40  0    Vision Screening Comments:  Wears Glasses-Eye Exam with Dr. 12/2019   MDD, GAD: : 4 Stable control on nortriptyline 150 mg  and alprazolam 3 times daily prn. PDMP reviewed, no red flags.  Hypertension: At goal on BBlocker for PVCs.     BP Readings from Last 3 Encounters:  07/16/20 110/60  05/01/20 130/80  04/27/20 90/60  Using medication without problems or lightheadedness:  none Chest pain with exertion:none Edema:none Short of breath:none Average home BPs: Other issues:  Elevated Cholesterol:  Elevated risk of CVD and indication present for statin use, Lab Results  Component Value Date   CHOL 199 06/26/2020   HDL 66.60 06/26/2020   LDLCALC 112 (H) 06/26/2020   LDLDIRECT 138.7 01/12/2012   TRIG 102.0 06/26/2020   CHOLHDL 3 06/26/2020  Using medications without problems: Muscle aches:  Diet compliance: heart healthy diet Exercise: walking several times a week Other complaints: The 10-year ASCVD risk score 09/13/20 DC 05/03/20., et al., 2013) is: 17.9%   Values used to calculate the score:     Age: 70 years     Sex: Female     Is Non-Hispanic African American: No     Diabetic: No     Tobacco smoker: No     Systolic Blood Pressure: 110 mmHg     Is BP treated: Yes     HDL  Cholesterol: 66.6 mg/dL     Total Cholesterol: 199 mg/dL        Relevant past medical, surgical, family and social history reviewed and updated as indicated. Interim medical history since our last visit reviewed. Allergies and medications reviewed and updated. Outpatient Medications Prior to Visit  Medication Sig Dispense Refill  . ALPRAZolam (XANAX) 0.5 MG tablet Take 1 tablet (0.5 mg total) by mouth 3 (three) times daily as needed for anxiety. 90 tablet 0  . bisacodyl (BISACODYL) 5 MG EC tablet Take 1 tablet (5 mg total) by mouth daily as needed for moderate constipation. 30 tablet 0  . Cholecalciferol (VITAMIN D3 PO) Take 1 tablet by mouth daily.    . methocarbamol (ROBAXIN) 750 MG tablet Take 750 mg by mouth 3 (three)  times daily.    . metoprolol tartrate (LOPRESSOR) 50 MG tablet Take 1.5 tablets (75 mg total) by mouth 2 (two) times daily. 270 tablet 3  . nortriptyline (PAMELOR) 50 MG capsule TAKE 3 CAPSULES (150 MG TOTAL) BY MOUTH AT BEDTIME. 270 capsule 0  . pantoprazole (PROTONIX) 40 MG tablet TAKE ONE (1) TABLET BY MOUTH EVERY DAY 90 tablet 0  . vitamin B-12 (CYANOCOBALAMIN) 1000 MCG tablet Take 1,000 mcg by mouth daily.     No facility-administered medications prior to visit.     Per HPI unless specifically indicated in ROS section below Review of Systems  Constitutional: Negative for fatigue and fever.  HENT: Negative for congestion.   Eyes: Negative for pain.  Respiratory: Negative for cough and shortness of breath.   Cardiovascular: Negative for chest pain, palpitations and leg swelling.  Gastrointestinal: Negative for abdominal pain.  Genitourinary: Negative for dysuria and vaginal bleeding.  Musculoskeletal: Negative for back pain.  Neurological: Negative for syncope, light-headedness and headaches.  Psychiatric/Behavioral: Negative for dysphoric mood.   Objective:  BP 110/60   Pulse 69   Temp 98.1 F (36.7 C) (Temporal)   Ht 5' 3.5" (1.613 m)   Wt 126 lb 8 oz (57.4 kg)   SpO2 99%   BMI 22.06 kg/m   Wt Readings from Last 3 Encounters:  07/16/20 126 lb 8 oz (57.4 kg)  05/01/20 129 lb 12 oz (58.9 kg)  04/27/20 131 lb 8 oz (59.6 kg)      Physical Exam Constitutional:      General: She is not in acute distress.Vital signs are normal.     Appearance: Normal appearance. She is well-developed and well-nourished. She is not ill-appearing or toxic-appearing.  HENT:     Head: Normocephalic.     Right Ear: Hearing, tympanic membrane, ear canal and external ear normal.     Left Ear: Hearing, tympanic membrane, ear canal and external ear normal.     Nose: Nose normal.  Eyes:     General: Lids are normal. Lids are everted, no foreign bodies appreciated.     Extraocular Movements: EOM  normal.     Conjunctiva/sclera: Conjunctivae normal.     Pupils: Pupils are equal, round, and reactive to light.  Neck:     Thyroid: No thyroid mass or thyromegaly.     Vascular: No carotid bruit.     Trachea: Trachea normal.  Cardiovascular:     Rate and Rhythm: Normal rate and regular rhythm.     Pulses: Intact distal pulses.     Heart sounds: Normal heart sounds, S1 normal and S2 normal. No murmur heard. No gallop.   Pulmonary:     Effort: Pulmonary effort is normal. No  respiratory distress.     Breath sounds: Normal breath sounds. No wheezing, rhonchi or rales.  Abdominal:     General: Bowel sounds are normal. There is no distension or abdominal bruit.     Palpations: Abdomen is soft. There is no fluid wave, hepatosplenomegaly or mass.     Tenderness: There is no abdominal tenderness. There is no CVA tenderness, guarding or rebound.     Hernia: No hernia is present.  Musculoskeletal:     Cervical back: Normal range of motion and neck supple.  Lymphadenopathy:     Cervical: No cervical adenopathy.     Upper Body:  No axillary adenopathy present. Skin:    General: Skin is warm, dry and intact.     Findings: No rash.  Neurological:     Mental Status: She is alert.     Cranial Nerves: No cranial nerve deficit.     Sensory: No sensory deficit.     Deep Tendon Reflexes: Strength normal.  Psychiatric:        Mood and Affect: Mood is not anxious or depressed.        Speech: Speech normal.        Behavior: Behavior normal. Behavior is cooperative.        Cognition and Memory: Cognition and memory normal.        Judgment: Judgment normal.       Results for orders placed or performed in visit on 06/26/20  Comprehensive metabolic panel  Result Value Ref Range   Sodium 137 135 - 145 mEq/L   Potassium 4.1 3.5 - 5.1 mEq/L   Chloride 100 96 - 112 mEq/L   CO2 30 19 - 32 mEq/L   Glucose, Bld 90 70 - 99 mg/dL   BUN 8 6 - 23 mg/dL   Creatinine, Ser 2.26 0.40 - 1.20 mg/dL   Total  Bilirubin 0.3 0.2 - 1.2 mg/dL   Alkaline Phosphatase 87 39 - 117 U/L   AST 14 0 - 37 U/L   ALT 7 0 - 35 U/L   Total Protein 6.6 6.0 - 8.3 g/dL   Albumin 4.1 3.5 - 5.2 g/dL   GFR 33.35 (L) >45.62 mL/min   Calcium 9.1 8.4 - 10.5 mg/dL  Lipid panel  Result Value Ref Range   Cholesterol 199 0 - 200 mg/dL   Triglycerides 563.8 0.0 - 149.0 mg/dL   HDL 93.73 >42.87 mg/dL   VLDL 68.1 0.0 - 15.7 mg/dL   LDL Cholesterol 262 (H) 0 - 99 mg/dL   Total CHOL/HDL Ratio 3    NonHDL 132.40     This visit occurred during the SARS-CoV-2 public health emergency.  Safety protocols were in place, including screening questions prior to the visit, additional usage of staff PPE, and extensive cleaning of exam room while observing appropriate contact time as indicated for disinfecting solutions.   COVID 19 screen:  No recent travel or known exposure to COVID19 The patient denies respiratory symptoms of COVID 19 at this time. The importance of social distancing was discussed today.   Assessment and Plan   The patient's preventative maintenance and recommended screening tests for an annual wellness exam were reviewed in full today. Brought up to date unless services declined.  Counselled on the importance of diet, exercise, and its role in overall health and mortality. The patient's FH and SH was reviewed, including their home life, tobacco status, and drug and alcohol status.   Refused mammogram cologuard 2018.. no further indicated Uptodate with  vaccines. S/P COVID x 3, flu, PNA Last DEXA 2020 osteoporosis repeat due after 03/2021  Problem List Items Addressed This Visit    Aortic atherosclerosis (HCC)     Statin indicated and initiated.      Relevant Medications   atorvastatin (LIPITOR) 10 MG tablet   Other Relevant Orders   Lipid panel   Comprehensive metabolic panel   Depression, major, recurrent (HCC) (Chronic)   Essential hypertension, benign (Chronic)    Stable, chronic.  Continue  current medication.   Metoprolol 75 mg daily      Relevant Medications   atorvastatin (LIPITOR) 10 MG tablet   Other hyperlipidemia (Chronic)    Chronic, inadequate control:  Now with aortic atherosclerosis on CT abd noted. Elevated risk of CVD and indication present for statin use, she is agreeable today to try very low dose statin.  Start atorvastatin 10 mg  M, W F.       Relevant Medications   atorvastatin (LIPITOR) 10 MG tablet   Other Relevant Orders   Lipid panel   Comprehensive metabolic panel    Other Visit Diagnoses    Medicare annual wellness visit, subsequent    -  Primary       Meds ordered this encounter  Medications  . atorvastatin (LIPITOR) 10 MG tablet    Sig: 1 tablet po  three days a week.    Dispense:  30 tablet    Refill:  11    Kerby Nora, MD

## 2020-07-16 NOTE — Assessment & Plan Note (Addendum)
Chronic, inadequate control:  Now with aortic atherosclerosis on CT abd noted. Elevated risk of CVD and indication present for statin use, she is agreeable today to try very low dose statin.  Start atorvastatin 10 mg  M, W F.

## 2020-07-30 ENCOUNTER — Ambulatory Visit: Payer: Medicare Other | Admitting: Cardiology

## 2020-07-30 ENCOUNTER — Encounter: Payer: Self-pay | Admitting: Cardiology

## 2020-07-30 ENCOUNTER — Other Ambulatory Visit: Payer: Self-pay

## 2020-07-30 VITALS — BP 94/60 | HR 72 | Ht 63.5 in | Wt 128.6 lb

## 2020-07-30 DIAGNOSIS — E785 Hyperlipidemia, unspecified: Secondary | ICD-10-CM

## 2020-07-30 DIAGNOSIS — R002 Palpitations: Secondary | ICD-10-CM | POA: Diagnosis not present

## 2020-07-30 DIAGNOSIS — I10 Essential (primary) hypertension: Secondary | ICD-10-CM | POA: Diagnosis not present

## 2020-07-30 DIAGNOSIS — I059 Rheumatic mitral valve disease, unspecified: Secondary | ICD-10-CM

## 2020-07-30 DIAGNOSIS — I700 Atherosclerosis of aorta: Secondary | ICD-10-CM

## 2020-07-30 DIAGNOSIS — I493 Ventricular premature depolarization: Secondary | ICD-10-CM | POA: Diagnosis not present

## 2020-07-30 DIAGNOSIS — E7849 Other hyperlipidemia: Secondary | ICD-10-CM

## 2020-07-30 MED ORDER — METOPROLOL TARTRATE 50 MG PO TABS: 50.0000 mg | ORAL_TABLET | Freq: Two times a day (BID) | ORAL | 3 refills | Status: DC

## 2020-07-30 MED ORDER — ATORVASTATIN CALCIUM 10 MG PO TABS: ORAL_TABLET | ORAL | 11 refills | Status: DC

## 2020-07-30 NOTE — Progress Notes (Signed)
Primary Care Provider: [name], MD Cardiologist: Glenetta Hew, MD Electrophysiologist: None  Clinic Note: Chief Complaint  Patient presents with  . Follow-up    Frequent PVCs -> relatively asymptomatic  . Dizziness    Poor balance, orthostatic   ===================================  ASSESSMENT/PLAN   Problem List Items Addressed This Visit    Aortic atherosclerosis ([place])    Aortic atherosclerosis is a 75 year old is pretty much to be expected.  As such, we do want to be more cautious with her lipids.  Her LDL was 112 in January.  I think we want to shoot for less than 100.  I asked that she could potentially increase her Lipitor dosing to 5 days a week instead of 3.  Blood pressure is adequately controlled, in fact a little bit too much controlled.  She is not diabetic, and no longer smokes.  There is no evidence of aortic dilatation or ectasia/aneurysm.      Relevant Medications   metoprolol tartrate (LOPRESSOR) 50 MG tablet   atorvastatin (LIPITOR) 10 MG tablet   Hyperlipidemia LDL goal <100 (Chronic)    Not as well-controlled.  She did try low-dose statin.  At this point I would like for her to try to increase to 5 mg a day to try to achieve an LDL less than 100.      Relevant Medications   metoprolol tartrate (LOPRESSOR) 50 MG tablet   atorvastatin (LIPITOR) 10 MG tablet   Mitral valve disorder (Chronic)    Maybe only mild mitral prolapse noticed on most recent echocardiogram.  No indication for prophylactic antibiotics.  I probably would only recheck if there is evidence of mitral regurgitation murmur.      Relevant Medications   metoprolol tartrate (LOPRESSOR) 50 MG tablet   atorvastatin (LIPITOR) 10 MG tablet   Heart palpitations (Chronic)    Well-controlled on beta-blocker.  We can reduce the dose but allow for 25 to 50 mg PRN.      Relevant Orders   EKG 12-Lead (Completed)   Frequent unifocal PVCs - Primary (Chronic)    Well-controlled on current  dose of metoprolol.  I think she should be okay dropping down to 50 mg twice daily.  Will use additional 25 to 50 mg as needed.      Relevant Medications   metoprolol tartrate (LOPRESSOR) 50 MG tablet   atorvastatin (LIPITOR) 10 MG tablet   Other Relevant Orders   EKG 12-Lead (Completed)   Essential hypertension, benign (Chronic)    Blood pressure actually is little low today.  With her having some dizziness and off-balance symptoms and will be concerned about this pressure.  Plan: Reduce metoprolol dose to 50 mg twice daily.  Okay to use additional 1 to 2 tablets as needed for either elevated blood pressure or palpitations.        Relevant Medications   metoprolol tartrate (LOPRESSOR) 50 MG tablet   atorvastatin (LIPITOR) 10 MG tablet   Other Relevant Orders   EKG 12-Lead (Completed)     ===================================  HPI:    Tiffany [name] is a 75 y.o. female with a PMH below who presents today for annual follow-up  She is a former patient of Dr. Terance Ice, whom he was following for her palpitations and a reported history of mitral valve prolapse from the cardiac catheterization in 2002.[name]  MVP has not been confirmed by several echocardiogram evaluations --> only noted Mild MR, No MVP.  Tiffany [name] was last seen on October 01, 2019: Was doing well from cardiac standpoint.  At that time she was starting to show signs of emotionally coming to grips with her husband passing.  She had had some episodes in July and August of the previous year with lots of palpitations that were related to stress about her husband's death and she had to take additional dose of metoprolol.  She recalled having a pretty significant grief reaction.  Since then, she has not had any further episodes. => When her episodes do occur they are brief episodes lasting few seconds and she feels a little short of breath and dizzy but they did not last long enough to cause any near syncope.  She  was taking 75 mg twice daily Lasix  Recent Hospitalizations: None  Reviewed  CV studies:    The following studies were reviewed today: (if available, images/films reviewed: From Epic Chart or Care Everywhere) . None:   Interval History:   Tiffany [name] returns here today a little early for routine follow-up noting mostly that she has had some dizziness off and on her balance is off a little bit.  She really has not any palpitations.  Just occasionally here and there but nothing lasting more than a few seconds.  Nothing makes her symptomatic.  Probably even less than the last visit.  She is has had less emotional stress.  Of course if she does get stressed out or if she is more tired than usual, or has not been either eating or drinking well, she will get some palpitations then.  She was very concerned though because she had a CT scan of the abdomen pelvis done back in November looking for appendicitis and it indicated aortic atherosclerosis.  She was not sure what this meant and was asking for clarification.  CV Review of Symptoms (Summary): no chest pain or dyspnea on exertion positive for - palpitations and Dizziness, poor balance, low blood pressure negative for - edema, orthopnea, paroxysmal nocturnal dyspnea, rapid heart rate, shortness of breath or Although she has lightheadedness, there is no syncope or near syncope, no TIA or amaurosis fugax, claudication  The patient does have symptoms concerning for COVID-19 infection (fever, chills, cough, or new shortness of breath).   REVIEWED OF SYSTEMS   Review of Systems  Constitutional: Negative for malaise/fatigue and weight loss.  HENT: Negative for congestion and nosebleeds.   Respiratory: Negative for cough and shortness of breath.   Gastrointestinal: Negative for blood in stool.  Genitourinary: Negative for flank pain and hematuria.  Musculoskeletal: Negative for joint pain.  Neurological: Positive for dizziness (Balance is  off, somewhat orthostatic). Negative for weakness.  Psychiatric/Behavioral: Negative for depression and memory loss. The patient is not nervous/anxious and does not have insomnia.    I have reviewed and (if needed) personally updated the patient's problem list, medications, allergies, past medical and surgical history, social and family history.   PAST MEDICAL HISTORY   Past Medical History:  Diagnosis Date  . Anxiety and depression   . Arthritis    knee  . Dupuytren's contracture of hand   . Esophageal spasm   . GERD (gastroesophageal reflux disease)   . Heart murmur   . History of MRI of cervical spine 09/98   Dr. Rolin Barry  . History of MRI of lumbar spine 10/1998   Dr. Rolin Barry  . Hyperlipemia   . Hypertension   . Osteoporosis 07/15/1999  . Plantar fasciitis, bilateral     PAST SURGICAL HISTORY   Past  Surgical History:  Procedure Laterality Date  . 2 D Echo  08/1999~04/13/2006   Mild MVP, MILD MR, Mild aortic sclerosis  . Abd ultrasound  03/02/1999   NML, no gallstones  . CARDIAC CATHETERIZATION  01/2001   Nonobstructive CAD  . ESOPHAGOGASTRODUODENOSCOPY     Sliding H. H. ~ 07/1995-11/2003 Neg  . ESOPHAGOGASTRODUODENOSCOPY N/A 08/10/2012   Procedure: ESOPHAGOGASTRODUODENOSCOPY (EGD);  Surgeon: Lafayette Dragon, MD;  Location: [facility];  Service: Endoscopy;  Laterality: N/A;  . KNEE SURGERY    . [place]  . NM LEXISCAN MYOVIEW LTD  03/26/2012   LOW RISK; no ischemia or infarction. Gut attenuation.  [name] SAVORY DILATION N/A 08/10/2012   Procedure: SAVORY DILATION;  Surgeon: Lafayette Dragon, MD;  Location: [facility];  Service: Endoscopy;  Laterality: N/A;  . SPIROMETRY  06/2004   NML  . TONSILLECTOMY AND ADENOIDECTOMY  as a child  . TRANSTHORACIC ECHOCARDIOGRAM  02/11/2011   Normal LV Size & function; ef ~39-76%; grade 1 diastolic dysfunction. MILD MAC no mitral stenosis and trace mitral regurgitation, no comment of prolapse  . TUBAL LIGATION  1975    benign tumor    Immunization History  Administered Date(s) Administered  . Fluad Quad(high Dose 65+) 02/19/2019, 03/23/2020  . Hepatitis B 06/13/1996, 07/14/1996, 11/11/1996  . Influenza Whole 04/09/2007, 04/01/2008, 04/24/2009, 04/06/2010  . Influenza, High Dose Seasonal PF 04/05/2018  . Influenza,inj,Quad PF,6+ Mos 03/19/2013, 02/26/2014, 05/12/2015, 03/11/2016, 04/13/2017  . PFIZER(Purple Top)SARS-COV-2 Vaccination 07/06/2019, 07/29/2019, 03/23/2020  . Pneumococcal Conjugate-13 05/01/2014  . Pneumococcal Polysaccharide-23 04/24/2009  . Td 06/13/2000, 06/09/2017    MEDICATIONS/ALLERGIES   Current Meds  Medication Sig  . ALPRAZolam (XANAX) 0.5 MG tablet Take 1 tablet (0.5 mg total) by mouth 3 (three) times daily as needed for anxiety.  . bisacodyl (BISACODYL) 5 MG EC tablet Take 1 tablet (5 mg total) by mouth daily as needed for moderate constipation.  . Cholecalciferol (VITAMIN D3 PO) Take 1 tablet by mouth daily.  . methocarbamol (ROBAXIN) 750 MG tablet Take 750 mg by mouth 3 (three) times daily.  . nortriptyline (PAMELOR) 50 MG capsule TAKE 3 CAPSULES (150 MG TOTAL) BY MOUTH AT BEDTIME.  . pantoprazole (PROTONIX) 40 MG tablet TAKE ONE (1) TABLET BY MOUTH EVERY DAY  . vitamin B-12 (CYANOCOBALAMIN) 1000 MCG tablet Take 1,000 mcg by mouth daily.  . [DISCONTINUED] atorvastatin (LIPITOR) 10 MG tablet 1 tablet po  three days a week.  . [DISCONTINUED] metoprolol tartrate (LOPRESSOR) 50 MG tablet Take 1.5 tablets (75 mg total) by mouth 2 (two) times daily.    Allergies  Allergen Reactions  . Aspirin Other (See Comments)    REACTION: SWELLING  . Latex Anaphylaxis    REACTION: HIVES  . Codeine     REACTION: SWELLING  . Fexofenadine     REACTION: VOMITING  . Nsaids     REACTION: SWELLING  . Tramadol     REACTION: ITCH  . Zolpidem Tartrate     REACTION: unknown    SOCIAL HISTORY/FAMILY HISTORY   Reviewed in Epic:  Pertinent findings:  Social History   Tobacco Use  .  Smoking status: Never Smoker  . Smokeless tobacco: Never Used  Vaping Use  . Vaping Use: Never used  Substance Use Topics  . Alcohol use: No    Alcohol/week: 0.0 standard drinks  . Drug use: No   Social History   Social History Narrative   75 year old, white woman, widowed (recently - July 2020) mother of 3 with  only one child living.    She has 3 grandchildren.  Her Grandson lives next door & "keeps an eye on her"   Her son that had spina bifida died at age 90 01-Aug-2009).   Never smoked. Does not drink alcohol.    OBJCTIVE -PE, EKG, labs   Wt Readings from Last 3 Encounters:  07/30/20 128 lb 9.6 oz (58.3 kg)  07/16/20 126 lb 8 oz (57.4 kg)  05/01/20 129 lb 12 oz (58.9 kg)    Physical Exam: BP 94/60   Pulse 72   Ht 5' 3.5" (1.613 m)   Wt 128 lb 9.6 oz (58.3 kg)   BMI 22.42 kg/m  Physical Exam Vitals reviewed.  Constitutional:      General: She is not in acute distress.    Appearance: Normal appearance. She is normal weight. She is not ill-appearing or toxic-appearing.     Comments: Well-groomed.  Healthy-appearing appears younger than stated age.  HENT:     Head: Normocephalic and atraumatic.  Neck:     Vascular: No carotid bruit or JVD.  Cardiovascular:     Rate and Rhythm: Normal rate and regular rhythm.  No extrasystoles are present.    Chest Wall: PMI is not displaced.     Pulses: Normal pulses.     Heart sounds: Normal heart sounds. No murmur heard. No friction rub. No gallop.   Pulmonary:     Effort: Pulmonary effort is normal. No respiratory distress.     Breath sounds: Normal breath sounds.  Chest:     Chest wall: No tenderness.  Musculoskeletal:        General: No swelling. Normal range of motion.     Cervical back: Normal range of motion and neck supple.  Skin:    General: Skin is warm and dry.  Neurological:     General: No focal deficit present.     Mental Status: She is alert and oriented to person, place, and time.  Psychiatric:        Mood and  Affect: Mood normal.        Behavior: Behavior normal.        Thought Content: Thought content normal.        Judgment: Judgment normal.     Adult ECG Report  Rate: 67 ;  Rhythm: normal sinus rhythm and LAFB (-67 ) RBBB.  Low voltage.;   Narrative Interpretation: Stable Reviewed Recent Labs: Reviewed Lab Results  Component Value Date   CHOL 199 06/26/2020   HDL 66.60 06/26/2020   LDLCALC 112 (H) 06/26/2020   LDLDIRECT 138.7 01/12/2012   TRIG 102.0 06/26/2020   CHOLHDL 3 06/26/2020   Lab Results  Component Value Date   CREATININE 1.02 06/26/2020   BUN 8 06/26/2020   NA 137 06/26/2020   K 4.1 06/26/2020   CL 100 06/26/2020   CO2 30 06/26/2020   CBC Latest Ref Rng & Units 04/27/2020 09/14/2017 11/15/2016  WBC 4.0 - 10.5 K/uL 10.4 6.0 5.8  Hemoglobin 12.0 - 15.0 g/dL 12.7 12.7 12.8  Hematocrit 36.0 - 46.0 % 39.2 38.2 38.9  Platelets 150.0 - 400.0 K/uL 324.0 311.0 300.0    Lab Results  Component Value Date   TSH 2.34 01/21/2016    ==================================================  COVID-19 Education: The signs and symptoms of COVID-19 were discussed with the patient and how to seek care for testing (follow up with PCP or arrange E-visit).   The importance of social distancing and COVID-19 vaccination was discussed today. The  patient is practicing social distancing & Masking.   I spent a total of 68mnutes with the patient spent in direct patient consultation.  Additional time spent with chart review  / charting (studies, outside notes, etc): 12 min Total Time: 42 min   Current medicines are reviewed at length with the patient today.  (+/- concerns) n/a  This visit occurred during the SARS-CoV-2 public health emergency.  Safety protocols were in place, including screening questions prior to the visit, additional usage of staff PPE, and extensive cleaning of exam room while observing appropriate contact time as indicated for disinfecting solutions.  Notice: This  dictation was prepared with Dragon dictation along with smaller phrase technology. Any transcriptional errors that result from this process are unintentional and may not be corrected upon review.  Patient Instructions / Medication Changes & Studies & Tests Ordered   Patient Instructions  Medication Instructions:   Decrease Metoprolol  Tartrate  To 50 mg one tablet twice a day May take 1/2 tablet  Metoprolol tartrate  Tablet  As needed for increase palpitation.   *If you need a refill on your cardiac medications before your next appointment, please call your pharmacy*   Lab Work: Not needed   Testing/Procedures: Not needed   Follow-Up: [facility] and your health needs are our priority.  As part of our continuing mission to provide you with exceptional heart care, we have created designated Provider Care Teams.  These Care Teams include your primary Cardiologist (physician) and Advanced Practice Providers (APPs -  Physician Assistants and Nurse Practitioners) who all work together to provide you with the care you need, when you need it.  We recommend signing up for the patient portal called "MyChart".  Sign up information is provided on this After Visit Summary.  MyChart is used to connect with patients for Virtual Visits (Telemedicine).  Patients are able to view lab/test results, encounter notes, upcoming appointments, etc.  Non-urgent messages can be sent to your provider as well.   To learn more about what you can do with MyChart, go to [email or web address]    Your next appointment:   12 month(s)  The format for your next appointment:   In Person  Provider:   DGlenetta Hew MD    Studies Ordered:   Orders Placed This Encounter  Procedures  . EKG 12-Lead     DGlenetta Hew M.D., M.S. Interventional Cardiologist   Pager # [phone] # [phone] [address] [place] [address]  [facility] you [facility] at  [facility]!

## 2020-07-30 NOTE — Patient Instructions (Signed)
Medication Instructions:   Decrease Metoprolol  Tartrate  To 50 mg one tablet twice a day May take 1/2 tablet  Metoprolol tartrate  Tablet  As needed for increase palpitation.   *If you need a refill on your cardiac medications before your next appointment, please call your pharmacy*   Lab Work: Not needed   Testing/Procedures: Not needed   Follow-Up: At [facility], you and your health needs are our priority.  As part of our continuing mission to provide you with exceptional heart care, we have created designated Provider Care Teams.  These Care Teams include your primary Cardiologist (physician) and Advanced Practice Providers (APPs -  Physician Assistants and Nurse Practitioners) who all work together to provide you with the care you need, when you need it.  We recommend signing up for the patient portal called "MyChart".  Sign up information is provided on this After Visit Summary.  MyChart is used to connect with patients for Virtual Visits (Telemedicine).  Patients are able to view lab/test results, encounter notes, upcoming appointments, etc.  Non-urgent messages can be sent to your provider as well.   To learn more about what you can do with MyChart, go to [email or web address].    Your next appointment:   12 month(s)  The format for your next appointment:   In Person  Provider:   Bryan Lemma, MD

## 2020-08-05 ENCOUNTER — Other Ambulatory Visit (HOSPITAL_COMMUNITY): Payer: Self-pay | Admitting: Anesthesiology

## 2020-08-05 DIAGNOSIS — Z79899 Other long term (current) drug therapy: Secondary | ICD-10-CM | POA: Diagnosis not present

## 2020-08-05 DIAGNOSIS — M75121 Complete rotator cuff tear or rupture of right shoulder, not specified as traumatic: Secondary | ICD-10-CM | POA: Diagnosis not present

## 2020-08-05 DIAGNOSIS — M19011 Primary osteoarthritis, right shoulder: Secondary | ICD-10-CM | POA: Diagnosis not present

## 2020-08-05 DIAGNOSIS — M4608 Spinal enthesopathy, sacral and sacrococcygeal region: Secondary | ICD-10-CM | POA: Diagnosis not present

## 2020-08-05 DIAGNOSIS — M25511 Pain in right shoulder: Secondary | ICD-10-CM | POA: Diagnosis not present

## 2020-08-07 DIAGNOSIS — M25511 Pain in right shoulder: Secondary | ICD-10-CM | POA: Diagnosis not present

## 2020-08-13 DIAGNOSIS — M25511 Pain in right shoulder: Secondary | ICD-10-CM | POA: Diagnosis not present

## 2020-08-19 DIAGNOSIS — S46011D Strain of muscle(s) and tendon(s) of the rotator cuff of right shoulder, subsequent encounter: Secondary | ICD-10-CM | POA: Diagnosis not present

## 2020-08-21 ENCOUNTER — Other Ambulatory Visit (HOSPITAL_COMMUNITY): Payer: Self-pay | Admitting: Surgical

## 2020-08-21 DIAGNOSIS — M75121 Complete rotator cuff tear or rupture of right shoulder, not specified as traumatic: Secondary | ICD-10-CM | POA: Diagnosis not present

## 2020-08-21 DIAGNOSIS — M6281 Muscle weakness (generalized): Secondary | ICD-10-CM | POA: Diagnosis not present

## 2020-08-21 DIAGNOSIS — M25511 Pain in right shoulder: Secondary | ICD-10-CM | POA: Diagnosis not present

## 2020-08-21 DIAGNOSIS — M25611 Stiffness of right shoulder, not elsewhere classified: Secondary | ICD-10-CM | POA: Diagnosis not present

## 2020-08-29 ENCOUNTER — Encounter: Payer: Self-pay | Admitting: Cardiology

## 2020-08-29 NOTE — Assessment & Plan Note (Signed)
Well-controlled on current dose of metoprolol.  I think she should be okay dropping down to 50 mg twice daily.  Will use additional 25 to 50 mg as needed.

## 2020-08-29 NOTE — Assessment & Plan Note (Signed)
Maybe only mild mitral prolapse noticed on most recent echocardiogram.  No indication for prophylactic antibiotics.  I probably would only recheck if there is evidence of mitral regurgitation murmur.

## 2020-08-29 NOTE — Assessment & Plan Note (Signed)
Well-controlled on beta-blocker.  We can reduce the dose but allow for 25 to 50 mg PRN.

## 2020-08-29 NOTE — Assessment & Plan Note (Signed)
Not as well-controlled.  She did try low-dose statin.  At this point I would like for her to try to increase to 5 mg a day to try to achieve an LDL less than 100.

## 2020-08-29 NOTE — Assessment & Plan Note (Signed)
Blood pressure actually is little low today.  With her having some dizziness and off-balance symptoms and will be concerned about this pressure.  Plan: Reduce metoprolol dose to 50 mg twice daily.  Okay to use additional 1 to 2 tablets as needed for either elevated blood pressure or palpitations.

## 2020-08-29 NOTE — Assessment & Plan Note (Addendum)
Aortic atherosclerosis is a 75 year old is pretty much to be expected.  As such, we do want to be more cautious with her lipids.  Her LDL was 112 in January.  I think we want to shoot for less than 100.  I asked that she could potentially increase her Lipitor dosing to 5 days a week instead of 3.  Blood pressure is adequately controlled, in fact a little bit too much controlled.  She is not diabetic, and no longer smokes.  There is no evidence of aortic dilatation or ectasia/aneurysm.

## 2020-09-01 ENCOUNTER — Other Ambulatory Visit: Payer: Self-pay | Admitting: Family Medicine

## 2020-09-01 NOTE — Telephone Encounter (Signed)
Last office visit 07/16/2020 for MWV.  Last refilled 06/08/2020 for #[id number] with no refills.  No future appointments with PCP.

## 2020-09-02 ENCOUNTER — Telehealth: Payer: Self-pay

## 2020-09-02 DIAGNOSIS — S46011A Strain of muscle(s) and tendon(s) of the rotator cuff of right shoulder, initial encounter: Secondary | ICD-10-CM | POA: Diagnosis not present

## 2020-09-02 NOTE — Telephone Encounter (Signed)
   [facility] Pre-operative Risk Assessment    Request for surgical clearance:  1. What type of surgery is being performed? RIGHT SHOULDER REPLACEMENT  2. When is this surgery scheduled? TBD   3. What type of clearance is required (medical clearance vs. Pharmacy clearance to hold med vs. Both)? MEDICAL  4. Are there any medications that need to be held prior to surgery and how long? NONE   5. Practice name and name of physician performing surgery? [facility]    6. What is the office phone number? [phone]   7.   What is the office fax number? [phone]  8.   Anesthesia type (None, local, MAC, general) ? GENERAL

## 2020-09-02 NOTE — Telephone Encounter (Signed)
   Primary Cardiologist: Bryan Lemma, MD  Chart reviewed as part of pre-operative protocol coverage. Given past medical history and time since last visit, based on ACC/AHA guidelines, Tiffany [name] would be at acceptable risk for the planned procedure without further cardiovascular testing.   Her RCRI is a class I risk, 0.4% risk of major cardiac event.  I will route this recommendation to the requesting party via Epic fax function and remove from pre-op pool.  Please call with questions.  Thomasene Ripple. Cleaver NP-C    09/02/2020, 2:45 PM [facility] [address] Office [phone] Fax [phone]

## 2020-09-04 ENCOUNTER — Other Ambulatory Visit: Payer: Self-pay | Admitting: Cardiology

## 2020-09-09 ENCOUNTER — Telehealth: Payer: Self-pay

## 2020-09-09 NOTE — Telephone Encounter (Signed)
Patient called in requesting follow-up regarding a surgical clearance form to be completed. Patient stated that her cardiologist sent it over. Please advise.

## 2020-09-10 NOTE — Telephone Encounter (Signed)
Form completed, but pt need clearance form cardiology.. may have already received as she just saw them. Form completed in Donna's box.

## 2020-09-10 NOTE — Telephone Encounter (Signed)
Surgical Clearance form faxed to [employer].  Ms. Haefner notified of this via telephone.

## 2020-09-13 ENCOUNTER — Other Ambulatory Visit (HOSPITAL_COMMUNITY): Payer: Self-pay

## 2020-09-13 MED FILL — Atorvastatin Calcium Tab 10 MG (Base Equivalent): ORAL | 84 days supply | Qty: 60 | Fill #0 | Status: AC

## 2020-10-05 ENCOUNTER — Other Ambulatory Visit (HOSPITAL_COMMUNITY): Payer: Self-pay

## 2020-10-05 DIAGNOSIS — M12811 Other specific arthropathies, not elsewhere classified, right shoulder: Secondary | ICD-10-CM | POA: Diagnosis present

## 2020-10-05 DIAGNOSIS — S46011D Strain of muscle(s) and tendon(s) of the rotator cuff of right shoulder, subsequent encounter: Secondary | ICD-10-CM | POA: Diagnosis not present

## 2020-10-05 MED FILL — Alprazolam Tab 0.5 MG: ORAL | 30 days supply | Qty: 90 | Fill #0 | Status: AC

## 2020-10-05 MED FILL — Methocarbamol Tab 750 MG: ORAL | 30 days supply | Qty: 120 | Fill #0 | Status: AC

## 2020-10-05 NOTE — Patient Instructions (Addendum)
DUE TO COVID-19 ONLY ONE VISITOR IS ALLOWED TO COME WITH YOU AND STAY IN THE WAITING ROOM ONLY DURING PRE OP AND PROCEDURE DAY OF SURGERY.  TWO  VISITOR  MAY VISIT WITH YOU AFTER SURGERY IN YOUR PRIVATE ROOM DURING VISITING HOURS ONLY!  YOU NEED TO HAVE A COVID 19 TEST ON__5-6_____ @_______ , THIS TEST MUST BE DONE BEFORE SURGERY,  COVID TESTING SITE [address] [place] ,   IT IS ON THE RIGHT GOING OUT [address] APPROXIMATELY  2 MINUTES PAST [employer] ON THE RIGHT. ONCE YOUR COVID TEST IS COMPLETED,  PLEASE BEGIN THE QUARANTINE INSTRUCTIONS AS OUTLINED IN YOUR HANDOUT.                LALANYA RUFENER  10/05/2020   Your procedure is scheduled on: 10-20-20   Report to [facility] Main  Entrance   Report to short stay   at    0515  AM     Call this number if you have problems the morning of surgery [phone]    Remember: NO SOLID FOOD AFTER MIDNIGHT THE NIGHT PRIOR TO SURGERY. NOTHING BY MOUTH EXCEPT CLEAR LIQUIDS UNTIL   0430 am .   PLEASE FINISH ENSURE DRINK PER SURGEON ORDER  WHICH NEEDS TO BE COMPLETED AT     0430 am then nothing by mouth.    CLEAR LIQUID DIET   Foods Allowed                                                                              Foods Excluded  BLACK Coffee and tea, regular and decaf                             liquids that you cannot  Plain Jell-O any favor except red or purple                                           see through such as: Fruit ices (not with fruit pulp)                                                     milk, soups, orange juice  Iced Popsicles                                                           All solid food Carbonated beverages, regular and diet                                    Cranberry, grape and apple juices Sports drinks like Gatorade Lightly seasoned clear broth or consume(fat  free) Sugar, honey  syrup  _____________________________________________________________________    BRUSH YOUR TEETH MORNING OF SURGERY AND RINSE YOUR MOUTH OUT, NO CHEWING GUM CANDY OR MINTS.     Take these medicines the morning of surgery with A SIP OF WATER: pantoprazole,nortriptyline, metoprolol, gabapentin,lipitor,xanax as needed                                You may not have any metal on your body including hair pins and              piercings  Do not wear jewelry, make-up, lotions, powders or perfumes, deodorant             Do not wear nail polish on your fingernails.  Do not shave  48 hours prior to surgery.     Do not bring valuables to the hospital. [place] IS NOT             RESPONSIBLE   FOR VALUABLES.  Contacts, dentures or bridgework may not be worn into surgery.      Patients discharged the day of surgery will not be allowed to drive home. IF YOU ARE HAVING SURGERY AND GOING HOME THE SAME DAY, YOU MUST HAVE AN ADULT TO DRIVE YOU HOME AND BE WITH YOU FOR 24 HOURS. YOU MAY GO HOME BY TAXI OR UBER OR ORTHERWISE, BUT AN ADULT MUST ACCOMPANY YOU HOME AND STAY WITH YOU FOR 24 HOURS.  Name and phone number of your driver:  Special Instructions: N/A              Please read over the following fact sheets you were given: _____________________________________________________________________             [facility] - Preparing for Surgery Before surgery, you can play an important role.  Because skin is not sterile, your skin needs to be as free of germs as possible.  You can reduce the number of germs on your skin by washing with CHG (chlorahexidine gluconate) soap before surgery.  CHG is an antiseptic cleaner which kills germs and bonds with the skin to continue killing germs even after washing. Please DO NOT use if you have an allergy to CHG or antibacterial soaps.  If your skin becomes reddened/irritated stop using the CHG and inform your nurse when you arrive at Short Stay. Do not shave  (including legs and underarms) for at least 48 hours prior to the first CHG shower.  You may shave your face/neck. Please follow these instructions carefully:  1.  Shower with CHG Soap the night before surgery and the  morning of Surgery.  2.  If you choose to wash your hair, wash your hair first as usual with your  normal  shampoo.  3.  After you shampoo, rinse your hair and body thoroughly to remove the  shampoo.                           4.  Use CHG as you would any other liquid soap.  You can apply chg directly  to the skin and wash                       Gently with a scrungie or clean washcloth.  5.  Apply the CHG Soap to your body ONLY FROM THE NECK DOWN.   Do not use on face/ open  Wound or open sores. Avoid contact with eyes, ears mouth and genitals (private parts).                       Wash face,  Genitals (private parts) with your normal soap.             6.  Wash thoroughly, paying special attention to the area where your surgery  will be performed.  7.  Thoroughly rinse your body with warm water from the neck down.  8.  DO NOT shower/wash with your normal soap after using and rinsing off  the CHG Soap.                9.  Pat yourself dry with a clean towel.            10.  Wear clean pajamas.            11.  Place clean sheets on your bed the night of your first shower and do not  sleep with pets. Day of Surgery : Do not apply any lotions/deodorants the morning of surgery.  Please wear clean clothes to the hospital/surgery center.  FAILURE TO FOLLOW THESE INSTRUCTIONS MAY RESULT IN THE CANCELLATION OF YOUR SURGERY PATIENT SIGNATURE_________________________________  NURSE SIGNATURE__________________________________  ________________________________________________________________________   Adam Phenix  An incentive spirometer is a tool that can help keep your lungs clear and active. This tool measures how well you are filling your lungs with  each breath. Taking long deep breaths may help reverse or decrease the chance of developing breathing (pulmonary) problems (especially infection) following:  A long period of time when you are unable to move or be active. BEFORE THE PROCEDURE   If the spirometer includes an indicator to show your best effort, your nurse or respiratory therapist will set it to a desired goal.  If possible, sit up straight or lean slightly forward. Try not to slouch.  Hold the incentive spirometer in an upright position. INSTRUCTIONS FOR USE  1. Sit on the edge of your bed if possible, or sit up as far as you can in bed or on a chair. 2. Hold the incentive spirometer in an upright position. 3. Breathe out normally. 4. Place the mouthpiece in your mouth and seal your lips tightly around it. 5. Breathe in slowly and as deeply as possible, raising the piston or the ball toward the top of the column. 6. Hold your breath for 3-5 seconds or for as long as possible. Allow the piston or ball to fall to the bottom of the column. 7. Remove the mouthpiece from your mouth and breathe out normally. 8. Rest for a few seconds and repeat Steps 1 through 7 at least 10 times every 1-2 hours when you are awake. Take your time and take a few normal breaths between deep breaths. 9. The spirometer may include an indicator to show your best effort. Use the indicator as a goal to work toward during each repetition. 10. After each set of 10 deep breaths, practice coughing to be sure your lungs are clear. If you have an incision (the cut made at the time of surgery), support your incision when coughing by placing a pillow or rolled up towels firmly against it. Once you are able to get out of bed, walk around indoors and cough well. You may stop using the incentive spirometer when instructed by your caregiver.  RISKS AND COMPLICATIONS  Take your time so you do not get  dizzy or light-headed.  If you are in pain, you may need to take or  ask for pain medication before doing incentive spirometry. It is harder to take a deep breath if you are having pain. AFTER USE  Rest and breathe slowly and easily.  It can be helpful to keep track of a log of your progress. Your caregiver can provide you with a simple table to help with this. If you are using the spirometer at home, follow these instructions: [place] IF:   You are having difficultly using the spirometer.  You have trouble using the spirometer as often as instructed.  Your pain medication is not giving enough relief while using the spirometer.  You develop fever of 100.5 F (38.1 C) or higher. SEEK IMMEDIATE MEDICAL CARE IF:   You cough up bloody sputum that had not been present before.  You develop fever of 102 F (38.9 C) or greater.  You develop worsening pain at or near the incision site. MAKE SURE YOU:   Understand these instructions.  Will watch your condition.  Will get help right away if you are not doing well or get worse. Document Released: 10/10/2006 Document Revised: 08/22/2011 Document Reviewed: 12/11/2006 [facility] Patient Information 2014 [place], [place].   ________________________________________________________________________

## 2020-10-05 NOTE — Progress Notes (Addendum)
PCP - Alric Ran MD Cardiologist - Clearance 09-02-20 epic lov Bryan Lemma 07-30-20 epic  PPM/ICD -  Device Orders -  Rep Notified -   Chest x-ray -  EKG - 07-30-20  Stress Test -  ECHO - 2012 Cardiac Cath -   Sleep Study -  CPAP -   Fasting Blood Sugar -  Checks Blood Sugar _____ times a day  Blood Thinner Instructions: Aspirin Instructions:  ERAS Protcol - PRE-SURGERY Ensure or G2-   COVID TEST- 10-16-20  Activity-Able to walk a flight of stairs without SOB  Anesthesia review: HTN, Mummur, MVP  Patient denies shortness of breath, fever, cough and chest pain at PAT appointment   All instructions explained to the patient, with a verbal understanding of the material. Patient agrees to go over the instructions while at home for a better understanding. Patient also instructed to self quarantine after being tested for COVID-19. The opportunity to ask questions was provided.

## 2020-10-05 NOTE — Progress Notes (Signed)
Please place orders in epic pt. Is scheduled for a preop  

## 2020-10-09 ENCOUNTER — Encounter (HOSPITAL_COMMUNITY)
Admission: RE | Admit: 2020-10-09 | Discharge: 2020-10-09 | Disposition: A | Discharge status: Home / Self Care | Payer: Medicare Other | Source: Ambulatory Visit | Attending: Orthopedic Surgery | Admitting: Orthopedic Surgery

## 2020-10-09 ENCOUNTER — Other Ambulatory Visit: Payer: Self-pay

## 2020-10-09 ENCOUNTER — Encounter (HOSPITAL_COMMUNITY): Payer: Self-pay

## 2020-10-09 DIAGNOSIS — Z01812 Encounter for preprocedural laboratory examination: Secondary | ICD-10-CM | POA: Diagnosis not present

## 2020-10-09 HISTORY — DX: Nonrheumatic mitral (valve) prolapse: I34.1

## 2020-10-09 HISTORY — DX: Carpal tunnel syndrome, unspecified upper limb: G56.00

## 2020-10-09 HISTORY — DX: Atherosclerosis of aorta: I70.0

## 2020-10-09 LAB — BASIC METABOLIC PANEL
Anion gap: 5 (ref 5–15)
BUN: 6 mg/dL — ABNORMAL LOW (ref 8–23)
CO2: 31 mmol/L (ref 22–32)
Calcium: 9.3 mg/dL (ref 8.9–10.3)
Chloride: 105 mmol/L (ref 98–111)
Creatinine, Ser: 0.8 mg/dL (ref 0.44–1.00)
GFR, Estimated: >60 mL/min (ref >60)
Glucose, Bld: 83 mg/dL (ref 70–99)
Potassium: 4.8 mmol/L (ref 3.5–5.1)
Sodium: 141 mmol/L (ref 135–145)

## 2020-10-09 LAB — CBC
HCT: 39.2 % (ref 36.0–46.0)
Hemoglobin: 12.1 g/dL (ref 12.0–15.0)
MCH: 30.3 pg (ref 26.0–34.0)
MCHC: 30.9 g/dL (ref 30.0–36.0)
MCV: 98.2 fL (ref 80.0–100.0)
Platelets: 309 10*3/uL (ref 150–400)
RBC: 3.99 MIL/uL (ref 3.87–5.11)
RDW: 14.2 % (ref 11.5–15.5)
WBC: 7.8 10*3/uL (ref 4.0–10.5)
nRBC: 0 % (ref 0.0–0.2)

## 2020-10-09 LAB — SURGICAL PCR SCREEN
MRSA, PCR: NEGATIVE
Staphylococcus aureus: NEGATIVE

## 2020-10-12 NOTE — Anesthesia Preprocedure Evaluation (Addendum)
Anesthesia Evaluation  Patient identified by MRN, date of birth, ID band Patient awake    Reviewed: Allergy & Precautions, NPO status , Patient's Chart, lab work & pertinent test results  Airway Mallampati: II  TM Distance: >3 FB Neck ROM: Full    Dental no notable dental hx.    Pulmonary neg pulmonary ROS,    Pulmonary exam normal breath sounds clear to auscultation       Cardiovascular hypertension, Normal cardiovascular exam Rhythm:Regular Rate:Normal     Neuro/Psych Anxiety Depression negative neurological ROS     GI/Hepatic Neg liver ROS, GERD  ,  Endo/Other  negative endocrine ROS  Renal/GU negative Renal ROS  negative genitourinary   Musculoskeletal negative musculoskeletal ROS (+)   Abdominal   Peds negative pediatric ROS (+)  Hematology negative hematology ROS (+)   Anesthesia Other Findings   Reproductive/Obstetrics negative OB ROS                           Anesthesia Physical Anesthesia Plan  ASA: II  Anesthesia Plan: General   Post-op Pain Management:  [facility] for Post-op pain   Induction: Intravenous  PONV Risk Score and Plan: 3 and Ondansetron, Dexamethasone and Treatment may vary due to age or medical condition  Airway Management Planned: Oral ETT  Additional Equipment:   Intra-op Plan:   Post-operative Plan: Extubation in OR  Informed Consent: I have reviewed the patients History and Physical, chart, labs and discussed the procedure including the risks, benefits and alternatives for the proposed anesthesia with the patient or authorized representative who has indicated his/her understanding and acceptance.     Dental advisory given  Plan Discussed with: CRNA and Surgeon  Anesthesia Plan Comments: (Per cardiology note 09/02/20, "Chart reviewed as part of pre-operative protocol coverage. Given past medical history and time since last visit, based on ACC/AHA  guidelines, Tiffany [name] would be at acceptable risk for the planned procedure without further cardiovascular testing.   Her RCRI is a class I risk, 0.4% risk of major cardiac event.")       Anesthesia Quick Evaluation

## 2020-10-15 ENCOUNTER — Other Ambulatory Visit (INDEPENDENT_AMBULATORY_CARE_PROVIDER_SITE_OTHER): Payer: Medicare Other

## 2020-10-15 ENCOUNTER — Other Ambulatory Visit: Payer: Self-pay

## 2020-10-15 DIAGNOSIS — E7849 Other hyperlipidemia: Secondary | ICD-10-CM

## 2020-10-15 DIAGNOSIS — Z79899 Other long term (current) drug therapy: Secondary | ICD-10-CM

## 2020-10-15 LAB — LIPID PANEL
Cholesterol: 210 mg/dL — ABNORMAL HIGH (ref 0–200)
HDL: 73.9 mg/dL (ref >39.00)
LDL Cholesterol: 119 mg/dL — ABNORMAL HIGH (ref 0–99)
NonHDL: 136.34
Total CHOL/HDL Ratio: 3
Triglycerides: 87 mg/dL (ref 0.0–149.0)
VLDL: 17.4 mg/dL (ref 0.0–40.0)

## 2020-10-15 LAB — HEPATIC FUNCTION PANEL
ALT: 10 U/L (ref 0–35)
AST: 13 U/L (ref 0–37)
Albumin: 4.2 g/dL (ref 3.5–5.2)
Alkaline Phosphatase: 95 U/L (ref 39–117)
Bilirubin, Direct: 0.1 mg/dL (ref 0.0–0.3)
Total Bilirubin: 0.5 mg/dL (ref 0.2–1.2)
Total Protein: 6.7 g/dL (ref 6.0–8.3)

## 2020-10-16 ENCOUNTER — Other Ambulatory Visit (HOSPITAL_COMMUNITY)
Admission: RE | Admit: 2020-10-16 | Discharge: 2020-10-16 | Disposition: A | Discharge status: Home / Self Care | Payer: Medicare Other | Source: Ambulatory Visit | Attending: Orthopedic Surgery | Admitting: Orthopedic Surgery

## 2020-10-16 DIAGNOSIS — Z01812 Encounter for preprocedural laboratory examination: Secondary | ICD-10-CM | POA: Insufficient documentation

## 2020-10-16 DIAGNOSIS — Z20822 Contact with and (suspected) exposure to covid-19: Secondary | ICD-10-CM | POA: Diagnosis not present

## 2020-10-17 LAB — SARS CORONAVIRUS 2 (TAT 6-24 HRS): SARS Coronavirus 2: NEGATIVE

## 2020-10-19 ENCOUNTER — Other Ambulatory Visit (HOSPITAL_COMMUNITY): Payer: Self-pay

## 2020-10-19 DIAGNOSIS — M25511 Pain in right shoulder: Secondary | ICD-10-CM | POA: Diagnosis not present

## 2020-10-19 DIAGNOSIS — M4608 Spinal enthesopathy, sacral and sacrococcygeal region: Secondary | ICD-10-CM | POA: Diagnosis not present

## 2020-10-19 DIAGNOSIS — M19011 Primary osteoarthritis, right shoulder: Secondary | ICD-10-CM | POA: Diagnosis not present

## 2020-10-19 DIAGNOSIS — Z79899 Other long term (current) drug therapy: Secondary | ICD-10-CM | POA: Diagnosis not present

## 2020-10-19 MED ORDER — ALPRAZOLAM 0.5 MG PO TABS
ORAL_TABLET | ORAL | 2 refills | Status: DC
  Filled 2020-11-02: qty 90, 30d supply, fill #0
  Filled 2020-12-02: qty 90, 30d supply, fill #1
  Filled 2020-12-31: qty 90, 30d supply, fill #2

## 2020-10-19 MED ORDER — METHOCARBAMOL 750 MG PO TABS: ORAL_TABLET | ORAL | 4 refills | Status: DC
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
## 2020-10-20 ENCOUNTER — Ambulatory Visit (HOSPITAL_COMMUNITY): Payer: Medicare Other | Admitting: Physician Assistant

## 2020-10-20 ENCOUNTER — Encounter (HOSPITAL_COMMUNITY)
Admission: RE | Disposition: A | Discharge status: Home / Self Care | Payer: Self-pay | Source: Home / Self Care | Attending: Orthopedic Surgery

## 2020-10-20 ENCOUNTER — Ambulatory Visit (HOSPITAL_COMMUNITY)
Admission: RE | Admit: 2020-10-20 | Discharge: 2020-10-20 | Disposition: A | Discharge status: Home / Self Care | Payer: Medicare Other | Attending: Orthopedic Surgery | Admitting: Orthopedic Surgery

## 2020-10-20 ENCOUNTER — Ambulatory Visit (HOSPITAL_COMMUNITY): Payer: Medicare Other

## 2020-10-20 ENCOUNTER — Ambulatory Visit (HOSPITAL_COMMUNITY): Payer: Medicare Other | Admitting: Anesthesiology

## 2020-10-20 ENCOUNTER — Encounter (HOSPITAL_COMMUNITY): Payer: Self-pay | Admitting: Orthopedic Surgery

## 2020-10-20 ENCOUNTER — Other Ambulatory Visit (HOSPITAL_COMMUNITY): Payer: Self-pay

## 2020-10-20 DIAGNOSIS — I10 Essential (primary) hypertension: Secondary | ICD-10-CM | POA: Diagnosis not present

## 2020-10-20 DIAGNOSIS — Z888 Allergy status to other drugs, medicaments and biological substances status: Secondary | ICD-10-CM | POA: Insufficient documentation

## 2020-10-20 DIAGNOSIS — Z79899 Other long term (current) drug therapy: Secondary | ICD-10-CM | POA: Insufficient documentation

## 2020-10-20 DIAGNOSIS — Y939 Activity, unspecified: Secondary | ICD-10-CM | POA: Insufficient documentation

## 2020-10-20 DIAGNOSIS — Z885 Allergy status to narcotic agent status: Secondary | ICD-10-CM | POA: Diagnosis not present

## 2020-10-20 DIAGNOSIS — Z800 Family history of malignant neoplasm of digestive organs: Secondary | ICD-10-CM | POA: Diagnosis not present

## 2020-10-20 DIAGNOSIS — E785 Hyperlipidemia, unspecified: Secondary | ICD-10-CM | POA: Diagnosis not present

## 2020-10-20 DIAGNOSIS — Z91040 Latex allergy status: Secondary | ICD-10-CM | POA: Insufficient documentation

## 2020-10-20 DIAGNOSIS — Z96611 Presence of right artificial shoulder joint: Secondary | ICD-10-CM

## 2020-10-20 DIAGNOSIS — Z803 Family history of malignant neoplasm of breast: Secondary | ICD-10-CM | POA: Insufficient documentation

## 2020-10-20 DIAGNOSIS — S46011A Strain of muscle(s) and tendon(s) of the rotator cuff of right shoulder, initial encounter: Secondary | ICD-10-CM | POA: Diagnosis not present

## 2020-10-20 DIAGNOSIS — M12811 Other specific arthropathies, not elsewhere classified, right shoulder: Secondary | ICD-10-CM | POA: Diagnosis present

## 2020-10-20 DIAGNOSIS — M19011 Primary osteoarthritis, right shoulder: Secondary | ICD-10-CM | POA: Insufficient documentation

## 2020-10-20 DIAGNOSIS — Z886 Allergy status to analgesic agent status: Secondary | ICD-10-CM | POA: Insufficient documentation

## 2020-10-20 DIAGNOSIS — Z471 Aftercare following joint replacement surgery: Secondary | ICD-10-CM | POA: Diagnosis not present

## 2020-10-20 DIAGNOSIS — K219 Gastro-esophageal reflux disease without esophagitis: Secondary | ICD-10-CM | POA: Insufficient documentation

## 2020-10-20 DIAGNOSIS — X58XXXA Exposure to other specified factors, initial encounter: Secondary | ICD-10-CM | POA: Diagnosis not present

## 2020-10-20 HISTORY — PX: REVERSE SHOULDER ARTHROPLASTY: SHX5054

## 2020-10-20 SURGERY — ARTHROPLASTY, SHOULDER, TOTAL, REVERSE
Anesthesia: General | Site: Shoulder | Laterality: Right

## 2020-10-20 MED ORDER — MIDAZOLAM HCL 5 MG/5ML IJ SOLN
INTRAMUSCULAR | Status: DC | PRN
  Administered 2020-10-20: 1 mg via INTRAVENOUS

## 2020-10-20 MED ORDER — EPHEDRINE 5 MG/ML INJ
INTRAVENOUS | Status: AC
  Filled 2020-10-20: qty 10

## 2020-10-20 MED ORDER — LIDOCAINE 2% (20 MG/ML) 5 ML SYRINGE
INTRAMUSCULAR | Status: AC
  Filled 2020-10-20: qty 5

## 2020-10-20 MED ORDER — SENNA-DOCUSATE SODIUM 8.6-50 MG PO TABS
2.0000 | ORAL_TABLET | Freq: Every day | ORAL | 1 refills | Status: AC
  Filled 2020-10-20: qty 30, 15d supply, fill #0

## 2020-10-20 MED ORDER — ALBUMIN HUMAN 5 % IV SOLN
INTRAVENOUS | Status: AC
  Filled 2020-10-20: qty 250

## 2020-10-20 MED ORDER — DEXAMETHASONE SODIUM PHOSPHATE 10 MG/ML IJ SOLN
INTRAMUSCULAR | Status: AC
  Filled 2020-10-20: qty 1

## 2020-10-20 MED ORDER — LACTATED RINGERS IV BOLUS: 250.0000 mL | Freq: Once | INTRAVENOUS | Status: DC

## 2020-10-20 MED ORDER — ONDANSETRON HCL 4 MG/2ML IJ SOLN
INTRAMUSCULAR | Status: AC
  Filled 2020-10-20: qty 2

## 2020-10-20 MED ORDER — EPHEDRINE SULFATE 50 MG/ML IJ SOLN
INTRAMUSCULAR | Status: DC | PRN
  Administered 2020-10-20 (×2): 5 mg via INTRAVENOUS
  Administered 2020-10-20: 10 mg via INTRAVENOUS

## 2020-10-20 MED ORDER — STERILE WATER FOR IRRIGATION IR SOLN
Status: DC | PRN
  Administered 2020-10-20: 1000 mL

## 2020-10-20 MED ORDER — ROCURONIUM BROMIDE 100 MG/10ML IV SOLN
INTRAVENOUS | Status: DC | PRN
  Administered 2020-10-20: 60 mg via INTRAVENOUS

## 2020-10-20 MED ORDER — PROPOFOL 10 MG/ML IV BOLUS
INTRAVENOUS | Status: DC | PRN
  Administered 2020-10-20: 80 mg via INTRAVENOUS

## 2020-10-20 MED ORDER — DEXAMETHASONE SODIUM PHOSPHATE 10 MG/ML IJ SOLN
INTRAMUSCULAR | Status: DC | PRN
  Administered 2020-10-20: 8 mg via INTRAVENOUS

## 2020-10-20 MED ORDER — ONDANSETRON HCL 4 MG PO TABS
4.0000 mg | ORAL_TABLET | Freq: Three times a day (TID) | ORAL | 0 refills | Status: DC | PRN
  Filled 2020-10-20: qty 10, 4d supply, fill #0

## 2020-10-20 MED ORDER — ORAL CARE MOUTH RINSE: 15.0000 mL | Freq: Once | OROMUCOSAL | Status: AC

## 2020-10-20 MED ORDER — LACTATED RINGERS IV BOLUS
250.0000 mL | Freq: Once | INTRAVENOUS | Status: AC
  Administered 2020-10-20: 250 mL via INTRAVENOUS

## 2020-10-20 MED ORDER — MIDAZOLAM HCL 2 MG/2ML IJ SOLN
INTRAMUSCULAR | Status: AC
  Filled 2020-10-20: qty 2

## 2020-10-20 MED ORDER — HYDROMORPHONE HCL 1 MG/ML IJ SOLN: 0.2500 mg | INTRAMUSCULAR | Status: DC | PRN

## 2020-10-20 MED ORDER — PROPOFOL 10 MG/ML IV BOLUS
INTRAVENOUS | Status: AC
  Filled 2020-10-20: qty 20

## 2020-10-20 MED ORDER — SODIUM CHLORIDE 0.9 % IR SOLN
Status: DC | PRN
  Administered 2020-10-20: 1000 mL

## 2020-10-20 MED ORDER — CEFAZOLIN SODIUM-DEXTROSE 2-4 GM/100ML-% IV SOLN
2.0000 g | INTRAVENOUS | Status: AC
  Administered 2020-10-20: 2 g via INTRAVENOUS
  Filled 2020-10-20: qty 100

## 2020-10-20 MED ORDER — PHENYLEPHRINE HCL (PRESSORS) 10 MG/ML IV SOLN
INTRAVENOUS | Status: AC
  Filled 2020-10-20: qty 1

## 2020-10-20 MED ORDER — ALBUMIN HUMAN 5 % IV SOLN: INTRAVENOUS | Status: DC | PRN

## 2020-10-20 MED ORDER — POVIDONE-IODINE 10 % EX SWAB
2.0000 "application " | Freq: Once | CUTANEOUS | Status: AC
  Administered 2020-10-20: 2 via TOPICAL

## 2020-10-20 MED ORDER — PHENYLEPHRINE HCL-NACL 10-0.9 MG/250ML-% IV SOLN
INTRAVENOUS | Status: DC | PRN
  Administered 2020-10-20: 60 ug/min via INTRAVENOUS

## 2020-10-20 MED ORDER — FENTANYL CITRATE (PF) 100 MCG/2ML IJ SOLN
INTRAMUSCULAR | Status: DC | PRN
  Administered 2020-10-20: 50 ug via INTRAVENOUS
  Administered 2020-10-20: 25 ug via INTRAVENOUS

## 2020-10-20 MED ORDER — ONDANSETRON HCL 4 MG/2ML IJ SOLN
INTRAMUSCULAR | Status: DC | PRN
  Administered 2020-10-20: 4 mg via INTRAVENOUS

## 2020-10-20 MED ORDER — ONDANSETRON HCL 4 MG/2ML IJ SOLN: 4.0000 mg | Freq: Once | INTRAMUSCULAR | Status: DC | PRN

## 2020-10-20 MED ORDER — CHLORHEXIDINE GLUCONATE 0.12 % MT SOLN
15.0000 mL | Freq: Once | OROMUCOSAL | Status: AC
  Administered 2020-10-20: 15 mL via OROMUCOSAL

## 2020-10-20 MED ORDER — ROCURONIUM BROMIDE 10 MG/ML (PF) SYRINGE
PREFILLED_SYRINGE | INTRAVENOUS | Status: AC
  Filled 2020-10-20: qty 10

## 2020-10-20 MED ORDER — HYDROCODONE-ACETAMINOPHEN 7.5-325 MG PO TABS: 1.0000 | ORAL_TABLET | ORAL | Status: DC | PRN

## 2020-10-20 MED ORDER — SUGAMMADEX SODIUM 200 MG/2ML IV SOLN
INTRAVENOUS | Status: DC | PRN
  Administered 2020-10-20: 150 mg via INTRAVENOUS
  Administered 2020-10-20: 50 mg via INTRAVENOUS

## 2020-10-20 MED ORDER — HYDROCODONE-ACETAMINOPHEN 10-325 MG PO TABS
1.0000 | ORAL_TABLET | Freq: Four times a day (QID) | ORAL | 0 refills | Status: DC | PRN
  Filled 2020-10-20: qty 28, 7d supply, fill #0

## 2020-10-20 MED ORDER — LACTATED RINGERS IV SOLN: INTRAVENOUS | Status: DC

## 2020-10-20 MED ORDER — TRANEXAMIC ACID-NACL 1000-0.7 MG/100ML-% IV SOLN
1000.0000 mg | INTRAVENOUS | Status: AC
  Administered 2020-10-20: 1000 mg via INTRAVENOUS
  Filled 2020-10-20: qty 100

## 2020-10-20 MED ORDER — LACTATED RINGERS IV BOLUS
500.0000 mL | Freq: Once | INTRAVENOUS | Status: AC
  Administered 2020-10-20: 500 mL via INTRAVENOUS

## 2020-10-20 MED ORDER — LIDOCAINE HCL (CARDIAC) PF 100 MG/5ML IV SOSY
PREFILLED_SYRINGE | INTRAVENOUS | Status: DC | PRN
  Administered 2020-10-20: 100 mg via INTRAVENOUS

## 2020-10-20 MED ORDER — PHENYLEPHRINE HCL (PRESSORS) 10 MG/ML IV SOLN
INTRAVENOUS | Status: DC | PRN
  Administered 2020-10-20: 80 ug via INTRAVENOUS
  Administered 2020-10-20: 60 ug via INTRAVENOUS
  Administered 2020-10-20 (×2): 80 ug via INTRAVENOUS

## 2020-10-20 MED ORDER — ACETAMINOPHEN 500 MG PO TABS
1000.0000 mg | ORAL_TABLET | Freq: Once | ORAL | Status: AC
  Administered 2020-10-20: 1000 mg via ORAL
  Filled 2020-10-20: qty 2

## 2020-10-20 MED ORDER — HYDROCODONE-ACETAMINOPHEN 7.5-325 MG PO TABS
ORAL_TABLET | ORAL | Status: AC
  Administered 2020-10-20: 1 via ORAL
  Filled 2020-10-20: qty 1

## 2020-10-20 MED ORDER — FENTANYL CITRATE (PF) 250 MCG/5ML IJ SOLN
INTRAMUSCULAR | Status: AC
  Filled 2020-10-20: qty 5

## 2020-10-20 MED ORDER — PHENYLEPHRINE 40 MCG/ML (10ML) SYRINGE FOR IV PUSH (FOR BLOOD PRESSURE SUPPORT)
PREFILLED_SYRINGE | INTRAVENOUS | Status: AC
  Filled 2020-10-20: qty 10

## 2020-10-20 SURGICAL SUPPLY — 62 items
BAG ZIPLOCK 12X15 (MISCELLANEOUS) ×2 IMPLANT
BASEPLATE GLENOSPHERE 25 (Plate) ×2 IMPLANT
BEARING HUMERAL SHLDER 36M STD (Shoulder) ×1 IMPLANT
BIT DRILL TWIST 2.7 (BIT) ×2 IMPLANT
BLADE SAW SAG 73X25 THK (BLADE) ×1
BLADE SAW SGTL 73X25 THK (BLADE) ×1 IMPLANT
BOOTIES KNEE HIGH SLOAN (MISCELLANEOUS) ×2 IMPLANT
BOWL SMART MIX CTS (DISPOSABLE) IMPLANT
COVER BACK TABLE 60X90IN (DRAPES) ×2 IMPLANT
COVER MAYO STAND STRL (DRAPES) ×2 IMPLANT
COVER SURGICAL LIGHT HANDLE (MISCELLANEOUS) ×2 IMPLANT
COVER WAND RF STERILE (DRAPES) IMPLANT
DECANTER SPIKE VIAL GLASS SM (MISCELLANEOUS) IMPLANT
DRAPE ORTHO SPLIT 77X108 STRL (DRAPES)
DRAPE SHEET LG 3/4 BI-LAMINATE (DRAPES) ×4 IMPLANT
DRAPE SURG 17X11 SM STRL (DRAPES) ×2 IMPLANT
DRAPE SURG ORHT 6 SPLT 77X108 (DRAPES) IMPLANT
DRAPE U-SHAPE 47X51 STRL (DRAPES) ×2 IMPLANT
DRESSING MEPILEX FLEX 4X4 (GAUZE/BANDAGES/DRESSINGS) ×1 IMPLANT
DRSG MEPILEX BORDER 4X8 (GAUZE/BANDAGES/DRESSINGS) ×2 IMPLANT
DRSG MEPILEX FLEX 4X4 (GAUZE/BANDAGES/DRESSINGS) ×2
DURAPREP 26ML APPLICATOR (WOUND CARE) ×2 IMPLANT
ELECT REM PT RETURN 15FT ADLT (MISCELLANEOUS) ×2 IMPLANT
GLENOID SPHERE STD STRL 36MM (Orthopedic Implant) ×2 IMPLANT
GLOVE SRG 8 PF TXTR STRL LF DI (GLOVE) ×1 IMPLANT
GLOVE SURG ENC MOIS LTX SZ7 (GLOVE) ×2 IMPLANT
GLOVE SURG ORTHO LTX SZ7.5 (GLOVE) ×2 IMPLANT
GLOVE SURG UNDER POLY LF SZ7 (GLOVE) ×2 IMPLANT
GLOVE SURG UNDER POLY LF SZ8 (GLOVE) ×2
GOWN STRL REUS W/TWL 2XL LVL3 (GOWN DISPOSABLE) ×2 IMPLANT
GOWN STRL REUS W/TWL LRG LVL3 (GOWN DISPOSABLE) ×2 IMPLANT
HOOD PEEL AWAY FLYTE STAYCOOL (MISCELLANEOUS) ×6 IMPLANT
KIT BASIN OR (CUSTOM PROCEDURE TRAY) ×2 IMPLANT
KIT TURNOVER KIT A (KITS) ×2 IMPLANT
PACK SHOULDER (CUSTOM PROCEDURE TRAY) ×2 IMPLANT
PENCIL SMOKE EVACUATOR (MISCELLANEOUS) IMPLANT
PIN STEINMANN THREADED TIP (PIN) ×2 IMPLANT
PIN THREADED REVERSE (PIN) ×2 IMPLANT
PROTECTOR NERVE ULNAR (MISCELLANEOUS) ×2 IMPLANT
RESTRAINT HEAD UNIVERSAL NS (MISCELLANEOUS) ×2 IMPLANT
SCREW BONE CORT 6.5X35MM (Screw) ×2 IMPLANT
SCREW BONE LOCKING 4.75X30X3.5 (Screw) ×2 IMPLANT
SCREW LOCKING 4.75MMX15MM (Screw) ×4 IMPLANT
SCREW LOCKING STRL 4.75X25X3.5 (Screw) ×2 IMPLANT
SHOULDER HUMERAL BEAR 36M STD (Shoulder) ×2 IMPLANT
SLING ARM FOAM STRAP MED (SOFTGOODS) ×2 IMPLANT
SLING ARM IMMOBILIZER LRG (SOFTGOODS) ×2 IMPLANT
STEM HUMERAL STRL 11MMX55MM (Stem) ×2 IMPLANT
STRIP CLOSURE SKIN 1/2X4 (GAUZE/BANDAGES/DRESSINGS) ×2 IMPLANT
SUCTION FRAZIER HANDLE 12FR (TUBING) ×2
SUCTION TUBE FRAZIER 12FR DISP (TUBING) ×1 IMPLANT
SUPPORT WRAP ARM LG (MISCELLANEOUS) ×2 IMPLANT
SUT FIBERWIRE #2 38 REV NDL BL (SUTURE) ×8
SUT VIC AB 1 CT1 36 (SUTURE) ×2 IMPLANT
SUT VIC AB 2-0 CT1 27 (SUTURE) ×2
SUT VIC AB 2-0 CT1 TAPERPNT 27 (SUTURE) ×1 IMPLANT
SUT VIC AB 3-0 SH 8-18 (SUTURE) ×2 IMPLANT
SUTURE FIBERWR#2 38 REV NDL BL (SUTURE) ×4 IMPLANT
TOWEL OR 17X26 10 PK STRL BLUE (TOWEL DISPOSABLE) ×2 IMPLANT
TOWEL OR NON WOVEN STRL DISP B (DISPOSABLE) ×2 IMPLANT
TOWER CARTRIDGE SMART MIX (DISPOSABLE) IMPLANT
TRAY HUM REV SHOULDER STD +6 (Shoulder) ×2 IMPLANT

## 2020-10-20 NOTE — Evaluation (Signed)
Occupational Therapy Evaluation Patient Details Name: Tiffany [name] MRN: [id number] DOB: February 24, 1946 Today's Date: 10/20/2020    History of Present Illness Patient is a 75 year old female s/p right reverse total shoulder arthroplasty.   Clinical Impression   Patient is a 75 year old female s/p shoulder replacement without functional use of right dominant upper extremity secondary to effects of surgery and interscalene block and shoulder precautions. Therapist provided education and instruction to patient and daughter in regards to exercises, precautions, positioning, donning upper extremity clothing and bathing while maintaining shoulder precautions, ice and edema management and donning/doffing sling. Patient and daughter verbalized understanding and demonstrated as needed. Patient needed assistance to donn shirt,  and provided with instruction on compensatory strategies to perform ADLs. Patient to follow up with MD for further therapy needs.      Follow Up Recommendations  Follow surgeon's recommendation for DC plan and follow-up therapies    Equipment Recommendations  None recommended by OT       Precautions / Restrictions Precautions Precautions: Shoulder Type of Shoulder Precautions: AROM elbow, wrist, hand ok. NO A/PROM shoulder Shoulder Interventions: Shoulder sling/immobilizer;Off for dressing/bathing/exercises Precaution Booklet Issued: Yes (comment) Required Braces or Orthoses: Sling Restrictions Weight Bearing Restrictions: Yes RUE Weight Bearing: Non weight bearing      Mobility Bed Mobility               General bed mobility comments: in chair    Transfers Overall transfer level: Modified independent                    Balance Overall balance assessment: Mild deficits observed, not formally tested                                         ADL either performed or assessed with clinical judgement   ADL Overall ADL's : Needs  assistance/impaired     Grooming: Modified independent   Upper Body Bathing: Minimal assistance   Lower Body Bathing: Modified independent   Upper Body Dressing : Minimal assistance;Sitting;Cueing for UE precautions;Cueing for compensatory techniques;Cueing for sequencing Upper Body Dressing Details (indicate cue type and reason): assist with threading R UE Lower Body Dressing: Modified independent Lower Body Dressing Details (indicate cue type and reason): patient able to don underwear, pants, shoes without assist Toilet Transfer: Modified Independent   Toileting- Clothing Manipulation and Hygiene: Modified independent       Functional mobility during ADLs: Modified independent General ADL Comments: patient and daughter educated in compensatory strategies for ADL tasks in order to maintain shoulder precautions                  Pertinent Vitals/Pain Pain Assessment: Faces Faces Pain Scale: Hurts little more Pain Location: R arm Pain Descriptors / Indicators: Heaviness Pain Intervention(s): Monitored during session     Hand Dominance Right   Extremity/Trunk Assessment Upper Extremity Assessment Upper Extremity Assessment: RUE deficits/detail RUE Deficits / Details: + nerve block RUE: Unable to fully assess due to immobilization   Lower Extremity Assessment Lower Extremity Assessment: Overall WFL for tasks assessed       Communication Communication Communication: No difficulties   Cognition Arousal/Alertness: Awake/alert Behavior During Therapy: WFL for tasks assessed/performed Overall Cognitive Status: Within Functional Limits for tasks assessed  Exercises Exercises: Shoulder   Shoulder Instructions Shoulder Instructions Donning/doffing shirt without moving shoulder: Minimal assistance;Patient able to independently direct caregiver;Caregiver independent with task Method for sponge bathing under operated  UE: Minimal assistance;Caregiver independent with task;Patient able to independently direct caregiver Donning/doffing sling/immobilizer: Minimal assistance;Patient able to independently direct caregiver;Caregiver independent with task Correct positioning of sling/immobilizer: Caregiver independent with task;Patient able to independently direct caregiver Pendulum exercises (written home exercise program):  (N/A) ROM for elbow, wrist and digits of operated UE: Caregiver independent with task;Patient able to independently direct caregiver Sling wearing schedule (on at all times/off for ADL's): Caregiver independent with task;Patient able to independently direct caregiver Proper positioning of operated UE when showering: Patient able to independently direct caregiver;Caregiver independent with task Dressing change:  (N/A) Positioning of UE while sleeping: Patient able to independently direct caregiver;Caregiver independent with task    Home Living Family/patient expects to be discharged to:: Private residence Living Arrangements: Alone Available Help at Discharge: Family;Available 24 hours/day (3-4 days post op) Type of Home: House Home Access: Ramped entrance     Home Layout: One level     Bathroom Shower/Tub: Producer, television/film/video: Standard     Home Equipment: Cane - single point;Shower seat;Bedside commode;Walker - 2 wheels   Additional Comments: reports house is handicap accessible      Prior Functioning/Environment Level of Independence: Independent                 OT Problem List: Pain;Impaired UE functional use;Decreased knowledge of precautions         OT Goals(Current goals can be found in the care plan section) Acute Rehab OT Goals Patient Stated Goal: home OT Goal Formulation: All assessment and education complete, DC therapy   AM-PAC OT "6 Clicks" Daily Activity     Outcome Measure Help from another person eating meals?: A Little Help from another  person taking care of personal grooming?: None Help from another person toileting, which includes using toliet, bedpan, or urinal?: A Little Help from another person bathing (including washing, rinsing, drying)?: A Little Help from another person to put on and taking off regular upper body clothing?: A Little Help from another person to put on and taking off regular lower body clothing?: None 6 Click Score: 20   End of Session Equipment Utilized During Treatment: Other (comment) (sling) Nurse Communication: Other (comment) (OT complete)  Activity Tolerance: Patient tolerated treatment well Patient left: in chair;with call bell/phone within reach;with family/visitor present  OT Visit Diagnosis: Pain Pain - Right/Left: Right Pain - part of body: Shoulder                Time: 1100-1130 OT Time Calculation (min): 30 min Charges:  OT General Charges $OT Visit: 1 Visit OT Evaluation $OT Eval Low Complexity: 1 Low OT Treatments $Self Care/Home Management : 8-22 mins  Marlyce Huge OT OT pager: [phone]  Carmelia Roller 10/20/2020, 11:37 AM

## 2020-10-20 NOTE — Anesthesia Procedure Notes (Signed)
Anesthesia Procedure Image    

## 2020-10-20 NOTE — Op Note (Signed)
10/20/2020  9:51 AM  PATIENT:  Tiffany [name]    PRE-OPERATIVE DIAGNOSIS:  Right irreparable rotator cuff tear  POST-OPERATIVE DIAGNOSIS:  Same  PROCEDURE: RIGHT Reverse Total Shoulder Arthroplasty  SURGEON:  Eulas Post, MD  PHYSICIAN ASSISTANT: Janine Ores, PA-C, present and scrubbed throughout the case, critical for completion in a timely fashion, and for retraction, instrumentation, and closure.  ANESTHESIA:   General with interscalene block using Exparel  ESTIMATED BLOOD LOSS: 100 ml  UNIQUE ASPECTS OF THE CASE:  Bone quality was poor.  The subchondral bone on the glenoid fragmented at the junction between the inferior blush and the superior transition to the subchondral plate.  The fiberwire were easily delivered through the bone for the subscapularis repair.  The tension of the construct felt excellent, even though I think the height of the head cut was just between the anatomic and standard reverse cut.  It was a little challenging to get the glenosphere in because the metal cap on the humeral surface was difficult to get out of the way, but ultimately I was able to get this moved manually.    PREOPERATIVE INDICATIONS:  Tiffany [name] is a  75 y.o. female with a diagnosis of degenerative joint disease right shoulder who failed conservative measures and elected for surgical management.    The risks benefits and alternatives were discussed with the patient preoperatively including but not limited to the risks of infection, bleeding, nerve injury, cardiopulmonary complications, the need for revision surgery, dislocation, brachial plexus palsy, incomplete relief of pain, among others, and the patient was willing to proceed.  OPERATIVE IMPLANTS: Biomet size 11 micro humeral stem press-fit with a 36 mm std offset reverse shoulder arthroplasty tray with a 36 mm Vivacit-E standardy polyethylene liner and a 36 mm glenosphere set on B placed with inferior offset, with a mini  baseplate and 4 locking screws and one central nonlocking screw.  OPERATIVE FINDINGS: she actually had a fair amount of chondral damage on the superior head.  Rotator cuff supraspinatus was fibrotic and deficient.  The subscapularis was good quality and repaired nicely.    OPERATIVE PROCEDURE: The patient was brought to the operating room and placed in the supine position. General anesthesia was administered. IV antibiotics were given.  Time out was performed. The upper extremity was prepped and draped in usual sterile fashion. The patient was in a beachchair position. Deltopectoral approach was carried out. The biceps was tenodesed to the pectoralis tendon with #2 Fiberwire. The subscapularis was released off of the bone.   I then performed circumferential releases of the humerus, and then dislocated the head, and then reamed with the reamer to the above named size.  I then applied the jig, and cut the humeral head in 30 of retroversion, and then turned my attention to the glenoid.  Deep retractors were placed, and I resected the labrum, and then placed a guidepin into the center position on the glenoid, with slight inferior inclination. I then reamed over the guidepin, and this created a small metaphyseal cancellus blush inferiorly, removing just the cartilage to the subchondral bone superiorly. The base plate was selected and impacted place, and then I secured it centrally with a nonlocking screw, and I had excellent purchase both inferiorly and superiorly. I placed a short locking screws on anterior and posterior aspects.  I then turned my attention to the glenosphere, and impacted this into place, placing slight inferior offset (set on B).   The glenosphere was completely  seated, and had engagement of the [facility] taper. I then turned my attention back to the humerus.  I sequentially broached, and then trialed, and was found to restore soft tissue tension, and it had 2 finger tightness. Therefore  the above named components were selected. The shoulder felt stable throughout functional motion.  Before I placed the real prosthesis I had also placed a total of 3 #2 FiberWire through drill holes in the humerus for later subscapularis repair.  I then impacted the real prosthesis into place, as well as the real humeral tray, and reduced the shoulder. The shoulder had excellent motion, and was stable, and I irrigated the wounds copiously.    I then used these to repair the subscapularis. This came down to bone.  I then irrigated the shoulder copiously once more, repaired the deltopectoral interval with Vicryl followed by subcutaneous Vicryl with Steri-Strips and sterile gauze for the skin. The patient was awakened and returned back in stable and satisfactory condition. There were no complications and She tolerated the procedure well.

## 2020-10-20 NOTE — Transfer of Care (Signed)
Immediate Anesthesia Transfer of Care Note  Patient: TENIKA KEERAN  Procedure(s) Performed: REVERSE SHOULDER ARTHROPLASTY (Right Shoulder)  Patient Location: PACU  Anesthesia Type:General  Level of Consciousness: awake, alert , oriented and patient cooperative  Airway & Oxygen Therapy: Patient Spontanous Breathing and Patient connected to face mask oxygen  Post-op Assessment: Report given to RN and Post -op Vital signs reviewed and stable  Post vital signs: Reviewed and stable  Last Vitals:  Vitals Value Taken Time  BP 128/65 10/20/20 1000  Temp    Pulse 79 10/20/20 1001  Resp 20 10/20/20 1001  SpO2 100 % 10/20/20 1001  Vitals shown include unvalidated device data.  Last Pain:  Vitals:   10/20/20 0627  TempSrc:   PainSc: 0-No pain      Patients Stated Pain Goal: 4 (10/20/20 3086)  Complications: No complications documented.

## 2020-10-20 NOTE — Anesthesia Procedure Notes (Signed)

## 2020-10-20 NOTE — Interval H&P Note (Signed)
History and Physical Interval Note:  10/20/2020 7:18 AM  Tiffany [name]  has presented today for surgery, with the diagnosis of right shoulder rotator cuff irreparable tear.  The various methods of treatment have been discussed with the patient and family. After consideration of risks, benefits and other options for treatment, the patient has consented to  Procedure(s): REVERSE SHOULDER ARTHROPLASTY (Right) as a surgical intervention.  The patient's history has been reviewed, patient examined, no change in status, stable for surgery.  I have reviewed the patient's chart and labs.  Questions were answered to the patient's satisfaction.     Eulas Post

## 2020-10-20 NOTE — Discharge Instructions (Signed)
INSTRUCTIONS AFTER JOINT REPLACEMENT   o Remove items at home which could result in a fall. This includes throw rugs or furniture in walking pathways o ICE to the affected joint every three hours while awake for 30 minutes at a time, for at least the first 3-5 days, and then as needed for pain and swelling.  Continue to use ice for pain and swelling. This is normal after surgery.   o Continue to use the breathing machine you got in the hospital (incentive spirometer) which will help keep your temperature down.  It is common for your temperature to cycle up and down following surgery, especially at night when you are not up moving around and exerting yourself.  The breathing machine keeps your lungs expanded and your temperature down.  Diet: As you were doing prior to hospitalization   Shower:  May shower but keep the wounds dry, use an occlusive plastic wrap, NO SOAKING IN TUB.  If the bandage gets wet, change with a clean dry gauze.    Dressing:  You may change your dressing 3-5 days after surgery. There are sticky tapes (steri-strips) on your wounds and all the stitches are absorbable.  Leave the steri-strips in place when changing your dressings, they will peel off with time, usually 2-3 weeks.  Activity:  Increase activity slowly as tolerated, but follow the weight bearing instructions below.  The rules on driving is that you can not be taking narcotics while you drive, and you must feel in control of the vehicle.    Weight Bearing:   No bearing weight with right arm. Please keep sling on and continue hand, wrist, and elbow exercises given to you be occupational therapy in the hospital.   MEDICATIONS:  See your medication summary on the "After Visit Summary" that nursing will review with you.  The use of Xanax, Robaxin (muscle relaxer), and narcotic pain medication like hydrocodone and oxycodone can have serious side effects that can cause you to stop breathing. Please limit the use of Xanax and  Robaxin while using post operative pain medication. (If you need to take the Xanax, please discontinue the Robaxin until you are done using all narcotic pain medication). Do not use all 3 medications at the same time.   POST-OPERATIVE OPIOID TAPER INSTRUCTIONS: . It is important to wean off of your opioid medication as soon as possible. If you do not need pain medication after your surgery it is ok to stop day one. [name] Opioids include: o Codeine, Hydrocodone(Norco, Vicodin), Oxycodone(Percocet, oxycontin) and hydromorphone amongst others.  . Long term and even short term use of opiods can cause: o Increased pain response o Dependence o Constipation o Depression o Respiratory depression o And more.  . Withdrawal symptoms can include o Flu like symptoms o Nausea, vomiting o And more . Techniques to manage these symptoms o Hydrate well o Eat regular healthy meals o Stay active o Use relaxation techniques(deep breathing, meditating, yoga) . Do Not substitute Alcohol to help with tapering . If you have been on opioids for less than two weeks and do not have pain than it is ok to stop all together.  . Plan to wean off of opioids o This plan should start within one week post op of your joint replacement. o Maintain the same interval or time between taking each dose and first decrease the dose.  o Cut the total daily intake of opioids by one tablet each day o Next start to increase the time  between doses. o The last dose that should be eliminated is the evening dose.   To prevent constipation: you may use a stool softener such as -  Colace (over the counter) 100 mg by mouth twice a day  Drink plenty of fluids (prune juice may be helpful) and high fiber foods Miralax (over the counter) for constipation as needed.    Itching:  If you experience itching with your medications, try taking only a single pain pill, or even half a pain pill at a time.  You may take up to 10 pain pills per day, and  you can also use benadryl over the counter for itching or also to help with sleep.   Precautions:  If you experience chest pain or shortness of breath - call 911 immediately for transfer to the hospital emergency department!!  If you develop a fever greater that 101 F, purulent drainage from wound, increased redness or drainage from wound, or calf pain -- Call the office at [phone]                                                Follow- Up Appointment:  Please call for an appointment to be seen in 2 weeks [place] - [phone]

## 2020-10-21 NOTE — Anesthesia Postprocedure Evaluation (Signed)
Anesthesia Post Note  Patient: Tiffany [name]  Procedure(s) Performed: REVERSE SHOULDER ARTHROPLASTY (Right Shoulder)     Patient location during evaluation: PACU Anesthesia Type: General Level of consciousness: awake and alert Pain management: pain level controlled Vital Signs Assessment: post-procedure vital signs reviewed and stable Respiratory status: spontaneous breathing, nonlabored ventilation, respiratory function stable and patient connected to nasal cannula oxygen Cardiovascular status: blood pressure returned to baseline and stable Postop Assessment: no apparent nausea or vomiting Anesthetic complications: no   No complications documented.  Last Vitals:  Vitals:   10/20/20 1048 10/20/20 1135  BP: 118/60 (!) 127/58  Pulse: 78 60  Resp:    Temp:    SpO2: 93% 94%    Last Pain:  Vitals:   10/20/20 1205  TempSrc:   PainSc: 2                  Antron Seth S

## 2020-10-22 ENCOUNTER — Encounter (HOSPITAL_COMMUNITY): Payer: Self-pay | Admitting: Orthopedic Surgery

## 2020-10-28 ENCOUNTER — Other Ambulatory Visit (HOSPITAL_COMMUNITY): Payer: Self-pay

## 2020-10-28 ENCOUNTER — Other Ambulatory Visit: Payer: Self-pay | Admitting: Surgical

## 2020-10-29 ENCOUNTER — Other Ambulatory Visit (HOSPITAL_COMMUNITY): Payer: Self-pay

## 2020-10-29 MED ORDER — HYDROCODONE-ACETAMINOPHEN 10-325 MG PO TABS
1.0000 | ORAL_TABLET | Freq: Four times a day (QID) | ORAL | 0 refills | Status: DC | PRN
  Filled 2020-10-29: qty 28, 7d supply, fill #0

## 2020-11-02 ENCOUNTER — Other Ambulatory Visit (HOSPITAL_COMMUNITY): Payer: Self-pay

## 2020-11-02 DIAGNOSIS — M19011 Primary osteoarthritis, right shoulder: Secondary | ICD-10-CM | POA: Diagnosis not present

## 2020-11-02 MED FILL — Methocarbamol Tab 750 MG: ORAL | 30 days supply | Qty: 120 | Fill #1 | Status: AC

## 2020-11-30 DIAGNOSIS — M19011 Primary osteoarthritis, right shoulder: Secondary | ICD-10-CM | POA: Diagnosis not present

## 2020-12-02 ENCOUNTER — Other Ambulatory Visit (HOSPITAL_COMMUNITY): Payer: Self-pay

## 2020-12-02 ENCOUNTER — Other Ambulatory Visit: Payer: Self-pay | Admitting: Family Medicine

## 2020-12-02 MED FILL — Methocarbamol Tab 750 MG: ORAL | 30 days supply | Qty: 120 | Fill #2 | Status: AC

## 2020-12-02 NOTE — Telephone Encounter (Signed)
Last office visit 07/16/2020 for MWV.  Last refilled 09/01/20 for #[id number] with no refills. No future appointments.

## 2020-12-03 ENCOUNTER — Other Ambulatory Visit (HOSPITAL_COMMUNITY): Payer: Self-pay

## 2020-12-03 DIAGNOSIS — R531 Weakness: Secondary | ICD-10-CM | POA: Diagnosis not present

## 2020-12-03 DIAGNOSIS — M25611 Stiffness of right shoulder, not elsewhere classified: Secondary | ICD-10-CM | POA: Diagnosis not present

## 2020-12-03 DIAGNOSIS — M25511 Pain in right shoulder: Secondary | ICD-10-CM | POA: Diagnosis not present

## 2020-12-04 ENCOUNTER — Other Ambulatory Visit (HOSPITAL_COMMUNITY): Payer: Self-pay

## 2020-12-04 ENCOUNTER — Other Ambulatory Visit: Payer: Self-pay | Admitting: Family Medicine

## 2020-12-04 MED FILL — Nortriptyline HCl Cap 50 MG: ORAL | 90 days supply | Qty: 270 | Fill #0 | Status: AC

## 2020-12-04 NOTE — Telephone Encounter (Signed)
Last office visit 07/16/2020 for MWV.  Last refilled 09/01/2020 for #[id number] with no refills.  No future appointments.

## 2020-12-10 ENCOUNTER — Other Ambulatory Visit: Payer: Self-pay | Admitting: Family Medicine

## 2020-12-10 ENCOUNTER — Other Ambulatory Visit (HOSPITAL_COMMUNITY): Payer: Self-pay

## 2020-12-10 DIAGNOSIS — M25511 Pain in right shoulder: Secondary | ICD-10-CM | POA: Diagnosis not present

## 2020-12-10 DIAGNOSIS — R531 Weakness: Secondary | ICD-10-CM | POA: Diagnosis not present

## 2020-12-10 DIAGNOSIS — M25611 Stiffness of right shoulder, not elsewhere classified: Secondary | ICD-10-CM | POA: Diagnosis not present

## 2020-12-10 MED ORDER — PANTOPRAZOLE SODIUM 40 MG PO TBEC
DELAYED_RELEASE_TABLET | Freq: Every day | ORAL | 0 refills | Status: DC
  Filled 2020-12-10: qty 90, 90d supply, fill #0

## 2020-12-11 ENCOUNTER — Telehealth: Payer: Self-pay | Admitting: Cardiology

## 2020-12-11 ENCOUNTER — Other Ambulatory Visit (HOSPITAL_COMMUNITY): Payer: Self-pay

## 2020-12-11 MED ORDER — METOPROLOL TARTRATE 50 MG PO TABS
50.0000 mg | ORAL_TABLET | Freq: Two times a day (BID) | ORAL | 3 refills | Status: DC
  Filled 2020-12-11: qty 225, 90d supply, fill #0

## 2020-12-11 NOTE — Telephone Encounter (Signed)
*  STAT* If patient is at the pharmacy, call can be transferred to refill team.   1. Which medications need to be refilled? (please list name of each medication and dose if known)  metoprolol tartrate (LOPRESSOR) 50 MG tablet  2. Which pharmacy/location (including street and city if local pharmacy) is medication to be sent to? [facility] at [facility]  3. Do they need a 30 day or 90 day supply? 90 day supply

## 2020-12-11 NOTE — Telephone Encounter (Signed)
Refills for patient's medication has been sent to the pharmacy.

## 2020-12-31 ENCOUNTER — Other Ambulatory Visit: Payer: Self-pay

## 2020-12-31 ENCOUNTER — Other Ambulatory Visit (HOSPITAL_COMMUNITY): Payer: Self-pay

## 2020-12-31 MED ORDER — METHOCARBAMOL 750 MG PO TABS
750.0000 mg | ORAL_TABLET | Freq: Four times a day (QID) | ORAL | 4 refills | Status: DC | PRN
  Filled 2020-12-31: qty 120, 30d supply, fill #0
  Filled 2021-01-28: qty 120, 30d supply, fill #1
  Filled 2021-03-01: qty 120, 30d supply, fill #2
  Filled 2021-03-30: qty 120, 30d supply, fill #3

## 2021-01-01 ENCOUNTER — Other Ambulatory Visit (HOSPITAL_COMMUNITY): Payer: Self-pay

## 2021-01-05 ENCOUNTER — Other Ambulatory Visit (HOSPITAL_COMMUNITY): Payer: Self-pay

## 2021-01-07 ENCOUNTER — Other Ambulatory Visit (HOSPITAL_COMMUNITY): Payer: Self-pay

## 2021-01-07 MED FILL — Atorvastatin Calcium Tab 10 MG (Base Equivalent): ORAL | 84 days supply | Qty: 60 | Fill #1 | Status: AC

## 2021-01-08 ENCOUNTER — Other Ambulatory Visit (HOSPITAL_COMMUNITY): Payer: Self-pay

## 2021-01-18 ENCOUNTER — Other Ambulatory Visit (HOSPITAL_COMMUNITY): Payer: Self-pay

## 2021-01-18 DIAGNOSIS — Z79899 Other long term (current) drug therapy: Secondary | ICD-10-CM | POA: Diagnosis not present

## 2021-01-18 DIAGNOSIS — M25511 Pain in right shoulder: Secondary | ICD-10-CM | POA: Diagnosis not present

## 2021-01-18 DIAGNOSIS — G894 Chronic pain syndrome: Secondary | ICD-10-CM | POA: Diagnosis not present

## 2021-01-18 DIAGNOSIS — Z79891 Long term (current) use of opiate analgesic: Secondary | ICD-10-CM | POA: Diagnosis not present

## 2021-01-18 DIAGNOSIS — M19011 Primary osteoarthritis, right shoulder: Secondary | ICD-10-CM | POA: Diagnosis not present

## 2021-01-18 DIAGNOSIS — M4608 Spinal enthesopathy, sacral and sacrococcygeal region: Secondary | ICD-10-CM | POA: Diagnosis not present

## 2021-01-18 MED ORDER — ALPRAZOLAM 0.5 MG PO TABS
0.5000 mg | ORAL_TABLET | Freq: Three times a day (TID) | ORAL | 2 refills | Status: DC | PRN
  Filled 2021-01-18 – 2021-01-29 (×2): qty 90, 30d supply, fill #0
  Filled 2021-03-01: qty 90, 30d supply, fill #1
  Filled 2021-03-30: qty 90, 30d supply, fill #2

## 2021-01-19 DIAGNOSIS — M47818 Spondylosis without myelopathy or radiculopathy, sacral and sacrococcygeal region: Secondary | ICD-10-CM | POA: Diagnosis not present

## 2021-01-19 DIAGNOSIS — M533 Sacrococcygeal disorders, not elsewhere classified: Secondary | ICD-10-CM | POA: Diagnosis not present

## 2021-01-28 ENCOUNTER — Other Ambulatory Visit (HOSPITAL_COMMUNITY): Payer: Self-pay

## 2021-01-29 ENCOUNTER — Other Ambulatory Visit (HOSPITAL_COMMUNITY): Payer: Self-pay

## 2021-02-10 DIAGNOSIS — H353131 Nonexudative age-related macular degeneration, bilateral, early dry stage: Secondary | ICD-10-CM | POA: Diagnosis not present

## 2021-02-10 DIAGNOSIS — H40003 Preglaucoma, unspecified, bilateral: Secondary | ICD-10-CM | POA: Diagnosis not present

## 2021-02-10 DIAGNOSIS — H35373 Puckering of macula, bilateral: Secondary | ICD-10-CM | POA: Diagnosis not present

## 2021-02-10 DIAGNOSIS — H04123 Dry eye syndrome of bilateral lacrimal glands: Secondary | ICD-10-CM | POA: Diagnosis not present

## 2021-02-16 DIAGNOSIS — M47812 Spondylosis without myelopathy or radiculopathy, cervical region: Secondary | ICD-10-CM | POA: Diagnosis not present

## 2021-02-16 DIAGNOSIS — G894 Chronic pain syndrome: Secondary | ICD-10-CM | POA: Diagnosis not present

## 2021-02-16 DIAGNOSIS — M25511 Pain in right shoulder: Secondary | ICD-10-CM | POA: Diagnosis not present

## 2021-02-16 DIAGNOSIS — Z79899 Other long term (current) drug therapy: Secondary | ICD-10-CM | POA: Diagnosis not present

## 2021-03-01 ENCOUNTER — Other Ambulatory Visit (HOSPITAL_COMMUNITY): Payer: Self-pay

## 2021-03-01 DIAGNOSIS — M25511 Pain in right shoulder: Secondary | ICD-10-CM | POA: Diagnosis not present

## 2021-03-02 ENCOUNTER — Other Ambulatory Visit (HOSPITAL_COMMUNITY): Payer: Self-pay

## 2021-03-03 ENCOUNTER — Other Ambulatory Visit (HOSPITAL_COMMUNITY): Payer: Self-pay

## 2021-03-05 ENCOUNTER — Other Ambulatory Visit: Payer: Self-pay | Admitting: Family Medicine

## 2021-03-05 ENCOUNTER — Encounter: Payer: Self-pay | Admitting: Family Medicine

## 2021-03-05 ENCOUNTER — Other Ambulatory Visit: Payer: Self-pay

## 2021-03-05 ENCOUNTER — Other Ambulatory Visit (HOSPITAL_COMMUNITY): Payer: Self-pay

## 2021-03-05 ENCOUNTER — Ambulatory Visit (INDEPENDENT_AMBULATORY_CARE_PROVIDER_SITE_OTHER): Payer: Medicare Other | Admitting: Family Medicine

## 2021-03-05 VITALS — BP 110/60 | HR 72 | Temp 98.1°F | Ht 63.5 in | Wt 121.0 lb

## 2021-03-05 DIAGNOSIS — H9193 Unspecified hearing loss, bilateral: Secondary | ICD-10-CM | POA: Insufficient documentation

## 2021-03-05 DIAGNOSIS — H539 Unspecified visual disturbance: Secondary | ICD-10-CM | POA: Diagnosis not present

## 2021-03-05 DIAGNOSIS — Z23 Encounter for immunization: Secondary | ICD-10-CM

## 2021-03-05 DIAGNOSIS — M5416 Radiculopathy, lumbar region: Secondary | ICD-10-CM | POA: Insufficient documentation

## 2021-03-05 DIAGNOSIS — H6983 Other specified disorders of Eustachian tube, bilateral: Secondary | ICD-10-CM

## 2021-03-05 DIAGNOSIS — H6993 Unspecified Eustachian tube disorder, bilateral: Secondary | ICD-10-CM | POA: Insufficient documentation

## 2021-03-05 MED ORDER — NORTRIPTYLINE HCL 50 MG PO CAPS
150.0000 mg | ORAL_CAPSULE | Freq: Every day | ORAL | 0 refills | Status: DC
  Filled 2021-03-05 – 2021-03-16 (×2): qty 270, 90d supply, fill #0

## 2021-03-05 MED ORDER — PREDNISONE 20 MG PO TABS
ORAL_TABLET | ORAL | 0 refills | Status: DC
  Filled 2021-03-05: qty 15, 7d supply, fill #0

## 2021-03-05 NOTE — Assessment & Plan Note (Signed)
Likely due to ETD.  No red flags for intracranial hemorrhage or brain mass.   If not improving with prednisone taper.. refer to ENT for further eval.

## 2021-03-05 NOTE — Assessment & Plan Note (Signed)
Continue heat, low back stretching and complete prednsione taper. ESI awaiting approval per pr.. Dr. Vear Clock.

## 2021-03-05 NOTE — Telephone Encounter (Signed)
Last office visit today 03/05/2021 for bilateral hearing loss, ETD, Lumbar radiulopathy & Vision Changes.  Last refilled 12/04/2020 for #270 with no refills.  No future appointments.
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
## 2021-03-05 NOTE — Patient Instructions (Signed)
Complete prednisone taper for right buttock pain likely from lumbar spine.  Continue heat and low back stretching.  Call Dr. Vear Clock for plan of steroid injection in lumbar spine.  Steroids should also help fluid behind ear drums drain.. if hearing not improved in 2 week call for ENT referral.  Call if any new neurologic changes.

## 2021-03-05 NOTE — Assessment & Plan Note (Signed)
New, per pt opthalmology stated normal slit lamp exam and just new acuity changes.  Glasses being made.

## 2021-03-05 NOTE — Assessment & Plan Note (Signed)
Most likely cause for fairly sudden  Hearing loss.  Treat with prednisone taper.

## 2021-03-09 ENCOUNTER — Telehealth: Payer: Self-pay | Admitting: *Deleted

## 2021-03-09 ENCOUNTER — Other Ambulatory Visit (HOSPITAL_COMMUNITY): Payer: Self-pay

## 2021-03-09 NOTE — Telephone Encounter (Signed)
Patient has been scheduled for an appointment with Dr. Milinda Antis tomorrow 03/10/21 at 10:30 am. Tried to call patient to get more information regarding her symptoms. No answer so a message was left on voicemail for patient to cal the office back.

## 2021-03-09 NOTE — Telephone Encounter (Signed)
PLEASE NOTE: All timestamps contained within this report are represented as [place] Standard Time. CONFIDENTIALTY NOTICE: This fax transmission is intended only for the addressee. It contains information that is legally privileged, confidential or otherwise protected from use or disclosure. If you are not the intended recipient, you are strictly prohibited from reviewing, disclosing, copying using or disseminating any of this information or taking any action in reliance on or regarding this information. If you have received this fax in error, please notify us immediately by telephone so that we can arrange for its return to [place]. Phone: [phone], Toll-Free: [phone], Fax: [phone] Page: 1 of 2 Call Id: [id number] [facility] Day - Client TELEPHONE ADVICE RECORD Access[facility] Patient Name: Tiffany [name] Gender: Female DOB: 1945-10-24 Age: 75 Y 11 M 12 D Return Phone Number: [phone] (Primary) Address: City/ State/ Zip: [place]  [address] [facility] Day - Client Client Site [facility] - Day Physician Kerby Nora - MD Contact Type Call Who Is Calling Patient / Member / Family / Caregiver Call Type Triage / Clinical Relationship To Patient Self Return Phone Number [phone] (Primary) Chief Complaint Headache Reason for Call Symptomatic / Request for Health Information Initial Comment Transferred from office, no appts today. PT started Prednisone on Friday 03/05/2021 for lower back pain and now having swelling around her face and hands with headaches. Translation No Nurse Assessment Nurse: D'Heur Ezzard Standing, RN, Adrienne Date/Time (Eastern Time): 03/09/2021 11:34:01 AM Confirm and document reason for call. If symptomatic, describe symptoms. ---Caller states she started Prednisone on Saturday 03/06/2021 for lower back pain and now she having swelling around her face and hands, with  headaches. No other s/s. Does the patient have any new or worsening symptoms? ---Yes Will a triage be completed? ---Yes Related visit to physician within the last 2 weeks? ---Yes Does the PT have any chronic conditions? (i.e. diabetes, asthma, this includes High risk factors for pregnancy, etc.) ---Yes List chronic conditions. ---HTN, mitral valve prolapse, GERD, anxiety Is this a behavioral health or substance abuse call? ---No Guidelines Guideline Title Affirmed Question Affirmed Notes Nurse Date/Time Lamount Cohen Time) Hand Swelling MODERATE hand swelling (e.g., visible swelling of hand and fingers; pitting edema) D'Heur Ezzard Standing, RN, Adrienne 03/09/2021 11:38:27 AM Disp. Time Lamount Cohen Time) Disposition Final User PLEASE NOTE: All timestamps contained within this report are represented as [place] Standard Time. CONFIDENTIALTY NOTICE: This fax transmission is intended only for the addressee. It contains information that is legally privileged, confidential or otherwise protected from use or disclosure. If you are not the intended recipient, you are strictly prohibited from reviewing, disclosing, copying using or disseminating any of this information or taking any action in reliance on or regarding this information. If you have received this fax in error, please notify us immediately by telephone so that we can arrange for its return to [place]. Phone: [phone], Toll-Free: [phone], Fax: [phone] Page: 2 of 2 Call Id: [id number] 03/09/2021 11:42:30 AM See PCP within 24 Hours Yes D'Heur Ezzard Standing, RN, Hansel Starling Caller Disagree/Comply Comply Caller Understands Yes PreDisposition Call Doctor Care Advice Given Per Guideline SEE PCP WITHIN 24 HOURS: * IF OFFICE WILL BE OPEN: You need to be examined within the next 24 hours. Call your doctor (or NP/PA) when the office opens and make an appointment. HAND PUFFINESS OR SWELLING: * Avoid wearing watches or other jewelry that is too tight on  your wrists. CALL BACK IF: * Breathing difficulty or chest pain occurs *  You become worse CARE ADVICE given per Hand Swelling (Adult) guideline. Referrals REFERRED TO PCP OFFICE

## 2021-03-10 ENCOUNTER — Other Ambulatory Visit: Payer: Self-pay

## 2021-03-10 ENCOUNTER — Encounter: Payer: Self-pay | Admitting: Family Medicine

## 2021-03-10 ENCOUNTER — Ambulatory Visit (INDEPENDENT_AMBULATORY_CARE_PROVIDER_SITE_OTHER): Payer: Medicare Other | Admitting: Family Medicine

## 2021-03-10 VITALS — BP 104/68 | HR 65 | Temp 97.9°F | Ht 63.5 in | Wt 125.5 lb

## 2021-03-10 DIAGNOSIS — G4489 Other headache syndrome: Secondary | ICD-10-CM | POA: Diagnosis not present

## 2021-03-10 DIAGNOSIS — I10 Essential (primary) hypertension: Secondary | ICD-10-CM

## 2021-03-10 DIAGNOSIS — H6983 Other specified disorders of Eustachian tube, bilateral: Secondary | ICD-10-CM | POA: Diagnosis not present

## 2021-03-10 DIAGNOSIS — R519 Headache, unspecified: Secondary | ICD-10-CM | POA: Insufficient documentation

## 2021-03-10 DIAGNOSIS — R609 Edema, unspecified: Secondary | ICD-10-CM | POA: Diagnosis not present

## 2021-03-10 DIAGNOSIS — M5416 Radiculopathy, lumbar region: Secondary | ICD-10-CM | POA: Diagnosis not present

## 2021-03-10 LAB — BASIC METABOLIC PANEL
BUN: 11 mg/dL (ref 6–23)
CO2: 36 mEq/L — ABNORMAL HIGH (ref 19–32)
Calcium: 9.2 mg/dL (ref 8.4–10.5)
Chloride: 99 mEq/L (ref 96–112)
Creatinine, Ser: 0.85 mg/dL (ref 0.40–1.20)
GFR: 66.3 mL/min (ref >60.00)
Glucose, Bld: 81 mg/dL (ref 70–99)
Potassium: 3.5 mEq/L (ref 3.5–5.1)
Sodium: 141 mEq/L (ref 135–145)

## 2021-03-10 LAB — CBC WITH DIFFERENTIAL/PLATELET
Basophils Absolute: 0.1 10*3/uL (ref 0.0–0.1)
Basophils Relative: 0.5 % (ref 0.0–3.0)
Eosinophils Absolute: 0 10*3/uL (ref 0.0–0.7)
Eosinophils Relative: 0.4 % (ref 0.0–5.0)
HCT: 38 % (ref 36.0–46.0)
Hemoglobin: 12.4 g/dL (ref 12.0–15.0)
Lymphocytes Relative: 31 % (ref 12.0–46.0)
Lymphs Abs: 3 10*3/uL (ref 0.7–4.0)
MCHC: 32.6 g/dL (ref 30.0–36.0)
MCV: 91.1 fl (ref 78.0–100.0)
Monocytes Absolute: 0.9 10*3/uL (ref 0.1–1.0)
Monocytes Relative: 9.4 % (ref 3.0–12.0)
Neutro Abs: 5.8 10*3/uL (ref 1.4–7.7)
Neutrophils Relative %: 58.7 % (ref 43.0–77.0)
Platelets: 348 10*3/uL (ref 150.0–400.0)
RBC: 4.17 Mil/uL (ref 3.87–5.11)
RDW: 14.3 % (ref 11.5–15.5)
WBC: 9.8 10*3/uL (ref 4.0–10.5)

## 2021-03-10 LAB — HEPATIC FUNCTION PANEL
ALT: 11 U/L (ref 0–35)
AST: 12 U/L (ref 0–37)
Albumin: 3.9 g/dL (ref 3.5–5.2)
Alkaline Phosphatase: 67 U/L (ref 39–117)
Bilirubin, Direct: 0.1 mg/dL (ref 0.0–0.3)
Total Bilirubin: 0.3 mg/dL (ref 0.2–1.2)
Total Protein: 6.2 g/dL (ref 6.0–8.3)

## 2021-03-10 LAB — TSH: TSH: 3.68 u[IU]/mL (ref 0.35–5.50)

## 2021-03-10 NOTE — Assessment & Plan Note (Signed)
BP stable currently  BP: 104/68  Metoprolol 50 mg bid

## 2021-03-10 NOTE — Patient Instructions (Addendum)
If your headache gets worse please let us know  Go to the ER if severe   Drink fluids   Labs now   We may consider and imaging study of the brain/head for headache   Watch for  Worse headache Dizziness  Rash (especially hives) Trouble breathing or swallowing or speaking Facial droop Numbness or weakness   Continue to hold prednisone
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
## 2021-03-10 NOTE — Telephone Encounter (Signed)
Patient had appointment with Dr.Tower today.

## 2021-03-10 NOTE — Assessment & Plan Note (Signed)
New bilateral constant frontal headache  On prednisone-pt thinks it is a side effect and it certainly can be  Low threshold to image due to age  Nl neuro exam/ reassuring  Rev s/s to watch for in detail  Labs pending

## 2021-03-10 NOTE — Assessment & Plan Note (Signed)
Per pt no imp in hearing so far with prednisone  Nl ear exam

## 2021-03-10 NOTE — Assessment & Plan Note (Signed)
Pt notes swelling in R hand and swollen/tight feeling around mouth and eyes  Since pt is new to me- cannot see a difference from prior  No severe or acute changes  Pt denies itching or rash or any wheeze/sob  Discussed possible prednisone reaction - is holding it today  Labs drawn

## 2021-03-11 ENCOUNTER — Telehealth: Payer: Self-pay | Admitting: Family Medicine

## 2021-03-11 ENCOUNTER — Ambulatory Visit (INDEPENDENT_AMBULATORY_CARE_PROVIDER_SITE_OTHER)
Admission: RE | Admit: 2021-03-11 | Discharge: 2021-03-11 | Disposition: A | Discharge status: Home / Self Care | Payer: Medicare Other | Source: Ambulatory Visit | Attending: Family Medicine | Admitting: Family Medicine

## 2021-03-11 ENCOUNTER — Inpatient Hospital Stay: Admission: RE | Admit: 2021-03-11 | Payer: Medicare Other | Source: Ambulatory Visit

## 2021-03-11 DIAGNOSIS — R519 Headache, unspecified: Secondary | ICD-10-CM

## 2021-03-11 DIAGNOSIS — G4489 Other headache syndrome: Secondary | ICD-10-CM

## 2021-03-11 NOTE — Telephone Encounter (Signed)
I want to go ahead and order a CT of her head since this is a new headache at 75 yo  Ordered this with pref for [facility] (first available)  She lives in [place]  She is not in office   Will cc to pcp   Will cc to referrals

## 2021-03-11 NOTE — Telephone Encounter (Signed)
Patient scanned today 03/11/21 at 3pm at [facility] [place]  Nothing further needed.

## 2021-03-11 NOTE — Progress Notes (Signed)
Noted  

## 2021-03-11 NOTE — Telephone Encounter (Signed)
-----   Message from Nyra Capes, [place] sent at 03/10/2021  4:37 PM EDT ----- Called the patient headache is still present but less than before. Swelling inside your mouth is what she says. She says it's like when you have a dry mouth. Patient states she is drinking plenty of water. Patient stated she has been taking Tylenol but its not helping. Patient stated she was going to take some benadryl or zyrtec.

## 2021-03-15 ENCOUNTER — Other Ambulatory Visit (HOSPITAL_COMMUNITY): Payer: Self-pay

## 2021-03-15 DIAGNOSIS — M47816 Spondylosis without myelopathy or radiculopathy, lumbar region: Secondary | ICD-10-CM | POA: Diagnosis not present

## 2021-03-16 ENCOUNTER — Other Ambulatory Visit (HOSPITAL_COMMUNITY): Payer: Self-pay

## 2021-03-30 ENCOUNTER — Other Ambulatory Visit (HOSPITAL_COMMUNITY): Payer: Self-pay

## 2021-03-30 ENCOUNTER — Other Ambulatory Visit: Payer: Self-pay | Admitting: Family Medicine

## 2021-03-30 DIAGNOSIS — M47816 Spondylosis without myelopathy or radiculopathy, lumbar region: Secondary | ICD-10-CM | POA: Diagnosis not present

## 2021-03-30 MED ORDER — PANTOPRAZOLE SODIUM 40 MG PO TBEC
40.0000 mg | DELAYED_RELEASE_TABLET | Freq: Every day | ORAL | 0 refills | Status: DC
  Filled 2021-03-30: qty 90, 90d supply, fill #0

## 2021-03-30 MED FILL — Atorvastatin Calcium Tab 10 MG (Base Equivalent): ORAL | 84 days supply | Qty: 60 | Fill #2 | Status: AC

## 2021-03-31 ENCOUNTER — Other Ambulatory Visit (HOSPITAL_COMMUNITY): Payer: Self-pay

## 2021-04-19 ENCOUNTER — Other Ambulatory Visit (HOSPITAL_COMMUNITY): Payer: Self-pay

## 2021-04-19 DIAGNOSIS — M47812 Spondylosis without myelopathy or radiculopathy, cervical region: Secondary | ICD-10-CM | POA: Diagnosis not present

## 2021-04-19 DIAGNOSIS — M25511 Pain in right shoulder: Secondary | ICD-10-CM | POA: Diagnosis not present

## 2021-04-19 DIAGNOSIS — Z79899 Other long term (current) drug therapy: Secondary | ICD-10-CM | POA: Diagnosis not present

## 2021-04-19 DIAGNOSIS — G894 Chronic pain syndrome: Secondary | ICD-10-CM | POA: Diagnosis not present

## 2021-04-19 MED ORDER — ALPRAZOLAM 0.5 MG PO TABS
0.5000 mg | ORAL_TABLET | Freq: Three times a day (TID) | ORAL | 2 refills | Status: DC | PRN
  Filled 2021-04-19 – 2021-04-29 (×2): qty 90, 30d supply, fill #0
  Filled 2021-05-27: qty 90, 30d supply, fill #1
  Filled 2021-06-25: qty 90, 30d supply, fill #2

## 2021-04-19 MED ORDER — METHOCARBAMOL 750 MG PO TABS
750.0000 mg | ORAL_TABLET | Freq: Four times a day (QID) | ORAL | 4 refills | Status: DC | PRN
  Filled 2021-04-19: qty 120, 30d supply, fill #0
  Filled 2021-05-18: qty 120, 30d supply, fill #1
  Filled 2021-06-17: qty 120, 30d supply, fill #2
  Filled 2021-07-16: qty 120, 30d supply, fill #3
  Filled 2021-08-13: qty 120, 30d supply, fill #4

## 2021-04-20 ENCOUNTER — Other Ambulatory Visit (HOSPITAL_COMMUNITY): Payer: Self-pay

## 2021-04-29 ENCOUNTER — Other Ambulatory Visit (HOSPITAL_COMMUNITY): Payer: Self-pay

## 2021-05-18 ENCOUNTER — Other Ambulatory Visit (HOSPITAL_COMMUNITY): Payer: Self-pay

## 2021-05-23 DIAGNOSIS — M25532 Pain in left wrist: Secondary | ICD-10-CM | POA: Diagnosis not present

## 2021-05-27 ENCOUNTER — Other Ambulatory Visit: Payer: Self-pay | Admitting: Cardiology

## 2021-05-27 ENCOUNTER — Other Ambulatory Visit: Payer: Self-pay

## 2021-05-27 ENCOUNTER — Other Ambulatory Visit (HOSPITAL_COMMUNITY): Payer: Self-pay

## 2021-05-27 MED ORDER — ATORVASTATIN CALCIUM 10 MG PO TABS
10.0000 mg | ORAL_TABLET | Freq: Every day | ORAL | 0 refills | Status: DC
  Filled 2021-05-27 (×2): qty 30, 30d supply, fill #0

## 2021-05-27 MED ORDER — ATORVASTATIN CALCIUM 10 MG PO TABS
10.0000 mg | ORAL_TABLET | ORAL | 0 refills | Status: DC
  Filled 2021-05-27: qty 30, fill #0

## 2021-05-31 ENCOUNTER — Other Ambulatory Visit (HOSPITAL_COMMUNITY): Payer: Self-pay

## 2021-06-08 ENCOUNTER — Other Ambulatory Visit: Payer: Self-pay | Admitting: Anesthesiology

## 2021-06-08 ENCOUNTER — Ambulatory Visit
Admission: RE | Admit: 2021-06-08 | Discharge: 2021-06-08 | Disposition: A | Discharge status: Home / Self Care | Payer: Medicare Other | Source: Ambulatory Visit | Attending: Anesthesiology | Admitting: Anesthesiology

## 2021-06-08 ENCOUNTER — Other Ambulatory Visit: Payer: Self-pay

## 2021-06-08 DIAGNOSIS — M25511 Pain in right shoulder: Secondary | ICD-10-CM | POA: Diagnosis not present

## 2021-06-08 DIAGNOSIS — M25552 Pain in left hip: Secondary | ICD-10-CM

## 2021-06-08 DIAGNOSIS — M47812 Spondylosis without myelopathy or radiculopathy, cervical region: Secondary | ICD-10-CM | POA: Diagnosis not present

## 2021-06-08 DIAGNOSIS — Z79899 Other long term (current) drug therapy: Secondary | ICD-10-CM | POA: Diagnosis not present

## 2021-06-08 DIAGNOSIS — G894 Chronic pain syndrome: Secondary | ICD-10-CM | POA: Diagnosis not present

## 2021-06-16 ENCOUNTER — Other Ambulatory Visit: Payer: Self-pay | Admitting: Family Medicine

## 2021-06-16 ENCOUNTER — Other Ambulatory Visit (HOSPITAL_COMMUNITY): Payer: Self-pay

## 2021-06-16 MED ORDER — NORTRIPTYLINE HCL 50 MG PO CAPS
150.0000 mg | ORAL_CAPSULE | Freq: Every day | ORAL | 0 refills | Status: DC
  Filled 2021-06-16: qty 270, 90d supply, fill #0

## 2021-06-16 NOTE — Telephone Encounter (Signed)
Last office visit 03/10/2021 with Dr. Milinda Antis for medication concerns.  Last refilled 03/05/2021 for #[id number] with no refills.  No future appointments.

## 2021-06-17 ENCOUNTER — Other Ambulatory Visit (HOSPITAL_COMMUNITY): Payer: Self-pay

## 2021-06-17 DIAGNOSIS — S63502A Unspecified sprain of left wrist, initial encounter: Secondary | ICD-10-CM | POA: Diagnosis not present

## 2021-06-25 ENCOUNTER — Other Ambulatory Visit (HOSPITAL_COMMUNITY): Payer: Self-pay

## 2021-07-01 DIAGNOSIS — M25552 Pain in left hip: Secondary | ICD-10-CM | POA: Diagnosis not present

## 2021-07-01 DIAGNOSIS — M545 Low back pain, unspecified: Secondary | ICD-10-CM | POA: Diagnosis not present

## 2021-07-02 ENCOUNTER — Other Ambulatory Visit: Payer: Self-pay | Admitting: Family Medicine

## 2021-07-02 ENCOUNTER — Other Ambulatory Visit (HOSPITAL_COMMUNITY): Payer: Self-pay

## 2021-07-02 ENCOUNTER — Other Ambulatory Visit: Payer: Self-pay | Admitting: Cardiology

## 2021-07-02 MED ORDER — METOPROLOL TARTRATE 50 MG PO TABS
50.0000 mg | ORAL_TABLET | Freq: Two times a day (BID) | ORAL | 0 refills | Status: DC
  Filled 2021-07-02: qty 90, 45d supply, fill #0

## 2021-07-02 MED ORDER — PANTOPRAZOLE SODIUM 40 MG PO TBEC
40.0000 mg | DELAYED_RELEASE_TABLET | Freq: Every day | ORAL | 0 refills | Status: DC
  Filled 2021-07-02: qty 90, 90d supply, fill #0

## 2021-07-02 MED ORDER — ATORVASTATIN CALCIUM 10 MG PO TABS
10.0000 mg | ORAL_TABLET | Freq: Every day | ORAL | 0 refills | Status: DC
  Filled 2021-07-02: qty 30, 30d supply, fill #0

## 2021-07-02 NOTE — Telephone Encounter (Signed)
Please schedule Medicare Wellness with nurse and CPE with fasting labs prior with Dr. Ermalene Searing for after 07/16/2021.  Ok to wait until we are back at [facility].

## 2021-07-05 DIAGNOSIS — Z79899 Other long term (current) drug therapy: Secondary | ICD-10-CM | POA: Diagnosis not present

## 2021-07-05 DIAGNOSIS — G894 Chronic pain syndrome: Secondary | ICD-10-CM | POA: Diagnosis not present

## 2021-07-05 DIAGNOSIS — M25511 Pain in right shoulder: Secondary | ICD-10-CM | POA: Diagnosis not present

## 2021-07-05 DIAGNOSIS — M5416 Radiculopathy, lumbar region: Secondary | ICD-10-CM | POA: Diagnosis not present

## 2021-07-05 NOTE — Telephone Encounter (Signed)
Called pt and lvmtcb.

## 2021-07-07 DIAGNOSIS — M545 Low back pain, unspecified: Secondary | ICD-10-CM | POA: Diagnosis not present

## 2021-07-09 DIAGNOSIS — M545 Low back pain, unspecified: Secondary | ICD-10-CM | POA: Diagnosis not present

## 2021-07-12 ENCOUNTER — Other Ambulatory Visit (HOSPITAL_COMMUNITY): Payer: Self-pay

## 2021-07-13 DIAGNOSIS — M48061 Spinal stenosis, lumbar region without neurogenic claudication: Secondary | ICD-10-CM | POA: Diagnosis not present

## 2021-07-13 DIAGNOSIS — M5417 Radiculopathy, lumbosacral region: Secondary | ICD-10-CM | POA: Diagnosis not present

## 2021-07-16 ENCOUNTER — Other Ambulatory Visit (HOSPITAL_COMMUNITY): Payer: Self-pay

## 2021-07-26 DIAGNOSIS — M25511 Pain in right shoulder: Secondary | ICD-10-CM | POA: Diagnosis not present

## 2021-07-26 DIAGNOSIS — M5416 Radiculopathy, lumbar region: Secondary | ICD-10-CM | POA: Diagnosis not present

## 2021-07-26 DIAGNOSIS — G894 Chronic pain syndrome: Secondary | ICD-10-CM | POA: Diagnosis not present

## 2021-07-26 DIAGNOSIS — Z79899 Other long term (current) drug therapy: Secondary | ICD-10-CM | POA: Diagnosis not present

## 2021-07-27 ENCOUNTER — Other Ambulatory Visit (HOSPITAL_COMMUNITY): Payer: Self-pay

## 2021-07-29 ENCOUNTER — Other Ambulatory Visit (HOSPITAL_COMMUNITY): Payer: Self-pay

## 2021-08-05 ENCOUNTER — Other Ambulatory Visit (HOSPITAL_COMMUNITY): Payer: Self-pay

## 2021-08-10 ENCOUNTER — Other Ambulatory Visit: Payer: Self-pay | Admitting: Cardiology

## 2021-08-10 ENCOUNTER — Other Ambulatory Visit (HOSPITAL_COMMUNITY): Payer: Self-pay

## 2021-08-10 MED ORDER — ATORVASTATIN CALCIUM 10 MG PO TABS
10.0000 mg | ORAL_TABLET | Freq: Every day | ORAL | 0 refills | Status: DC
  Filled 2021-08-10: qty 15, 15d supply, fill #0

## 2021-08-13 ENCOUNTER — Other Ambulatory Visit (HOSPITAL_COMMUNITY): Payer: Self-pay

## 2021-08-13 DIAGNOSIS — M48062 Spinal stenosis, lumbar region with neurogenic claudication: Secondary | ICD-10-CM | POA: Diagnosis not present

## 2021-08-13 DIAGNOSIS — M48061 Spinal stenosis, lumbar region without neurogenic claudication: Secondary | ICD-10-CM | POA: Diagnosis not present

## 2021-08-13 DIAGNOSIS — M4150 Other secondary scoliosis, site unspecified: Secondary | ICD-10-CM | POA: Diagnosis not present

## 2021-08-13 DIAGNOSIS — M5442 Lumbago with sciatica, left side: Secondary | ICD-10-CM | POA: Diagnosis not present

## 2021-08-13 DIAGNOSIS — G8929 Other chronic pain: Secondary | ICD-10-CM | POA: Diagnosis not present

## 2021-08-13 DIAGNOSIS — M5416 Radiculopathy, lumbar region: Secondary | ICD-10-CM | POA: Diagnosis not present

## 2021-08-20 ENCOUNTER — Encounter: Payer: Self-pay | Admitting: Family Medicine

## 2021-08-20 ENCOUNTER — Other Ambulatory Visit: Payer: Self-pay

## 2021-08-20 ENCOUNTER — Other Ambulatory Visit (HOSPITAL_COMMUNITY): Payer: Self-pay

## 2021-08-20 ENCOUNTER — Ambulatory Visit (INDEPENDENT_AMBULATORY_CARE_PROVIDER_SITE_OTHER): Payer: Medicare Other | Admitting: Family Medicine

## 2021-08-20 VITALS — BP 118/74 | HR 58 | Ht 63.5 in | Wt 127.2 lb

## 2021-08-20 DIAGNOSIS — F331 Major depressive disorder, recurrent, moderate: Secondary | ICD-10-CM

## 2021-08-20 DIAGNOSIS — I10 Essential (primary) hypertension: Secondary | ICD-10-CM

## 2021-08-20 DIAGNOSIS — E7849 Other hyperlipidemia: Secondary | ICD-10-CM | POA: Diagnosis not present

## 2021-08-20 DIAGNOSIS — I700 Atherosclerosis of aorta: Secondary | ICD-10-CM

## 2021-08-20 DIAGNOSIS — E785 Hyperlipidemia, unspecified: Secondary | ICD-10-CM | POA: Diagnosis not present

## 2021-08-20 DIAGNOSIS — F411 Generalized anxiety disorder: Secondary | ICD-10-CM

## 2021-08-20 LAB — LIPID PANEL
Cholesterol: 167 mg/dL (ref 0–200)
HDL: 83.2 mg/dL (ref >39.00)
LDL Cholesterol: 71 mg/dL (ref 0–99)
NonHDL: 83.47
Total CHOL/HDL Ratio: 2
Triglycerides: 60 mg/dL (ref 0.0–149.0)
VLDL: 12 mg/dL (ref 0.0–40.0)

## 2021-08-20 LAB — COMPREHENSIVE METABOLIC PANEL
ALT: 10 U/L (ref 0–35)
AST: 19 U/L (ref 0–37)
Albumin: 4.2 g/dL (ref 3.5–5.2)
Alkaline Phosphatase: 97 U/L (ref 39–117)
BUN: 6 mg/dL (ref 6–23)
CO2: 33 mEq/L — ABNORMAL HIGH (ref 19–32)
Calcium: 9.7 mg/dL (ref 8.4–10.5)
Chloride: 102 mEq/L (ref 96–112)
Creatinine, Ser: 0.98 mg/dL (ref 0.40–1.20)
GFR: 55.72 mL/min — ABNORMAL LOW (ref >60.00)
Glucose, Bld: 91 mg/dL (ref 70–99)
Potassium: 4.6 mEq/L (ref 3.5–5.1)
Sodium: 141 mEq/L (ref 135–145)
Total Bilirubin: 0.4 mg/dL (ref 0.2–1.2)
Total Protein: 6.8 g/dL (ref 6.0–8.3)

## 2021-08-20 MED ORDER — NORTRIPTYLINE HCL 50 MG PO CAPS
150.0000 mg | ORAL_CAPSULE | Freq: Every day | ORAL | 1 refills | Status: DC
  Filled 2021-08-20 – 2021-08-24 (×2): qty 270, 90d supply, fill #0
  Filled 2021-12-06: qty 270, 90d supply, fill #1

## 2021-08-20 NOTE — Assessment & Plan Note (Signed)
Stable, chronic.  Continue current medication. ? ? ?On metoprolol 50 mg 2 times daily, may take an extra half tablet as needed for increased palpitations ?

## 2021-08-20 NOTE — Assessment & Plan Note (Signed)
Chronic, well controlled ? ?Doing well on nortriptyline 150 mg p.o. at bedtime.  Refill given ?

## 2021-08-20 NOTE — Assessment & Plan Note (Signed)
Chronic, due for reeval ? ?She has been able to increase atorvastatin 10 mg to daily without side effects.  We will recheck cholesterol panel today to make sure her LDL is at goal less than 70.  If not when prescription is refilled I will increase the dose.  Patient is agreeable ?

## 2021-08-20 NOTE — Patient Instructions (Signed)
Please stop at the lab to have labs drawn.  ? Once results return we will refill the atorvastatin at 10 mg if appropriate. ?

## 2021-08-20 NOTE — Progress Notes (Signed)
Patient ID: Tiffany [name], female    DOB: 08-20-1945, 76 y.o.   MRN: [id number]  This visit was conducted in person.  BP 118/74    Pulse (!) 58    Ht 5' 3.5" (1.613 m)    Wt 127 lb 3.2 oz (57.7 kg)    BMI 22.18 kg/m    CC:  Chief Complaint  Patient presents with   Medication Refill    Pt need refill on medication on liptor and pamelor     Subjective:   HPI: Tiffany [name] is a 76 y.o. female presenting on 08/20/2021 for Medication Refill (Pt need refill on medication on liptor and pamelor )  Elevated Cholesterol:  atorvastatin 10 mg daily... not at goal LDL < 70 on current regimen. Hx of aortic atherosclerosis She has increase to daily as opposed to every other day. Lab Results  Component Value Date   CHOL 210 (H) 10/15/2020   HDL 73.90 10/15/2020   LDLCALC 119 (H) 10/15/2020   LDLDIRECT 138.7 01/12/2012   TRIG 87.0 10/15/2020   CHOLHDL 3 10/15/2020   The 10-year ASCVD risk score (Arnett DK, et al., 2019) is: 22.9%   Values used to calculate the score:     Age: 33 years     Sex: Female     Is Non-Hispanic African American: No     Diabetic: No     Tobacco smoker: No     Systolic Blood Pressure: 118 mmHg     Is BP treated: Yes     HDL Cholesterol: 73.9 mg/dL     Total Cholesterol: 210 mg/dL Using medications without problems: none Muscle aches:  none Diet compliance: good Exercise:walking Other complaints:  Hypertension:   BP Readings from Last 3 Encounters:  08/20/21 118/74  03/10/21 104/68  03/05/21 110/60     Using medication without problems or lightheadedness: none Chest pain with exertion:none Edema:none Short of breath:none Average home BPs: Other issues:   MDD/GAD:  On nortryptiline 150 mg at bedtime. Helps keep her calm and helps sleep at night. PHQ9 SCORE ONLY 08/20/2021 03/10/2021 07/16/2020  PHQ-9 Total Score 13 0 4    GAD7:  1   On alprazolam TID long-term.. has been unable to wean off.     Relevant past medical, surgical, family  and social history reviewed and updated as indicated. Interim medical history since our last visit reviewed. Allergies and medications reviewed and updated. Outpatient Medications Prior to Visit  Medication Sig Dispense Refill   ALPRAZolam (XANAX) 0.5 MG tablet Take 1 tablet (0.5 mg total) by mouth every 8 (eight) hours as needed. 90 tablet 2   atorvastatin (LIPITOR) 10 MG tablet Take 1 tablet (10 mg total) by mouth daily. PATIENT NEEDS TO SCHEDULE AN OFFICE VISIT FOR FUTURE REFILLS 15 tablet 0   Cholecalciferol (VITAMIN D3 PO) Take 1 tablet by mouth daily.     methocarbamol (ROBAXIN) 750 MG tablet Take 1 tablet (750 mg total) by mouth 4 (four) times daily as needed. 120 tablet 4   methocarbamol (ROBAXIN) 750 MG tablet Take 1 tablet (750 mg total) by mouth 4 (four) times daily as needed. 120 tablet 4   metoprolol tartrate (LOPRESSOR) 50 MG tablet Take 1 tablet (50 mg total) by mouth 2 (two) times daily. May take an extra 1/2 tablet ( 25 mg ) as needed for increase palpitations 90 tablet 0   nortriptyline (PAMELOR) 50 MG capsule Take 3 capsules (150 mg total) by mouth at bedtime. 270 capsule  0   pantoprazole (PROTONIX) 40 MG tablet Take 1 tablet (40 mg total) by mouth daily. 90 tablet 0   sennosides-docusate sodium (SENOKOT-S) 8.6-50 MG tablet Take 2 tablets by mouth daily. 30 tablet 1   vitamin B-12 (CYANOCOBALAMIN) 1000 MCG tablet Take 1,000 mcg by mouth daily.     predniSONE (DELTASONE) 20 MG tablet 3 tabs by mouth daily x 3 days, then 2 tabs by mouth daily x 2 days then 1 tab by mouth daily x 2 days 15 tablet 0   No facility-administered medications prior to visit.     Per HPI unless specifically indicated in ROS section below Review of Systems  Constitutional:  Negative for fatigue and fever.  HENT:  Negative for congestion.   Eyes:  Negative for pain.  Respiratory:  Negative for cough and shortness of breath.   Cardiovascular:  Negative for chest pain, palpitations and leg swelling.   Gastrointestinal:  Negative for abdominal pain.  Genitourinary:  Negative for dysuria and vaginal bleeding.  Musculoskeletal:  Negative for back pain.  Neurological:  Negative for syncope, light-headedness and headaches.  Psychiatric/Behavioral:  Negative for dysphoric mood.   Objective:  BP 118/74    Pulse (!) 58    Ht 5' 3.5" (1.613 m)    Wt 127 lb 3.2 oz (57.7 kg)    BMI 22.18 kg/m   Wt Readings from Last 3 Encounters:  08/20/21 127 lb 3.2 oz (57.7 kg)  03/10/21 125 lb 8 oz (56.9 kg)  03/05/21 121 lb (54.9 kg)      Physical Exam Constitutional:      General: She is not in acute distress.    Appearance: Normal appearance. She is well-developed. She is not ill-appearing or toxic-appearing.  HENT:     Head: Normocephalic.     Right Ear: Hearing, tympanic membrane, ear canal and external ear normal. Tympanic membrane is not erythematous, retracted or bulging.     Left Ear: Hearing, tympanic membrane, ear canal and external ear normal. Tympanic membrane is not erythematous, retracted or bulging.     Nose: No mucosal edema or rhinorrhea.     Right Sinus: No maxillary sinus tenderness or frontal sinus tenderness.     Left Sinus: No maxillary sinus tenderness or frontal sinus tenderness.     Mouth/Throat:     Pharynx: Uvula midline.  Eyes:     General: Lids are normal. Lids are everted, no foreign bodies appreciated.     Conjunctiva/sclera: Conjunctivae normal.     Pupils: Pupils are equal, round, and reactive to light.  Neck:     Thyroid: No thyroid mass or thyromegaly.     Vascular: No carotid bruit.     Trachea: Trachea normal.  Cardiovascular:     Rate and Rhythm: Normal rate and regular rhythm.     Pulses: Normal pulses.     Heart sounds: Normal heart sounds, S1 normal and S2 normal. No murmur heard.   No friction rub. No gallop.  Pulmonary:     Effort: Pulmonary effort is normal. No tachypnea or respiratory distress.     Breath sounds: Normal breath sounds. No decreased  breath sounds, wheezing, rhonchi or rales.  Abdominal:     General: Bowel sounds are normal.     Palpations: Abdomen is soft.     Tenderness: There is no abdominal tenderness.  Musculoskeletal:     Cervical back: Normal range of motion and neck supple.  Skin:    General: Skin is warm and dry.  Findings: No rash.  Neurological:     Mental Status: She is alert.  Psychiatric:        Mood and Affect: Mood is not anxious or depressed.        Speech: Speech normal.        Behavior: Behavior normal. Behavior is cooperative.        Thought Content: Thought content normal.        Judgment: Judgment normal.      Results for orders placed or performed in visit on 03/10/21  Basic metabolic panel  Result Value Ref Range   Sodium 141 135 - 145 mEq/L   Potassium 3.5 3.5 - 5.1 mEq/L   Chloride 99 96 - 112 mEq/L   CO2 36 (H) 19 - 32 mEq/L   Glucose, Bld 81 70 - 99 mg/dL   BUN 11 6 - 23 mg/dL   Creatinine, Ser 8.290.85 0.40 - 1.20 mg/dL   GFR 56.2166.30 >30.86>60.00 mL/min   Calcium 9.2 8.4 - 10.5 mg/dL  Hepatic function panel  Result Value Ref Range   Total Bilirubin 0.3 0.2 - 1.2 mg/dL   Bilirubin, Direct 0.1 0.0 - 0.3 mg/dL   Alkaline Phosphatase 67 39 - 117 U/L   AST 12 0 - 37 U/L   ALT 11 0 - 35 U/L   Total Protein 6.2 6.0 - 8.3 g/dL   Albumin 3.9 3.5 - 5.2 g/dL  CBC with Differential/Platelet  Result Value Ref Range   WBC 9.8 4.0 - 10.5 K/uL   RBC 4.17 3.87 - 5.11 Mil/uL   Hemoglobin 12.4 12.0 - 15.0 g/dL   HCT 57.838.0 46.936.0 - 62.946.0 %   MCV 91.1 78.0 - 100.0 fl   MCHC 32.6 30.0 - 36.0 g/dL   RDW 52.814.3 41.311.5 - 24.415.5 %   Platelets 348.0 150.0 - 400.0 K/uL   Neutrophils Relative % 58.7 43.0 - 77.0 %   Lymphocytes Relative 31.0 12.0 - 46.0 %   Monocytes Relative 9.4 3.0 - 12.0 %   Eosinophils Relative 0.4 0.0 - 5.0 %   Basophils Relative 0.5 0.0 - 3.0 %   Neutro Abs 5.8 1.4 - 7.7 K/uL   Lymphs Abs 3.0 0.7 - 4.0 K/uL   Monocytes Absolute 0.9 0.1 - 1.0 K/uL   Eosinophils Absolute 0.0 0.0 - 0.7  K/uL   Basophils Absolute 0.1 0.0 - 0.1 K/uL  TSH  Result Value Ref Range   TSH 3.68 0.35 - 5.50 uIU/mL    This visit occurred during the SARS-CoV-2 public health emergency.  Safety protocols were in place, including screening questions prior to the visit, additional usage of staff PPE, and extensive cleaning of exam room while observing appropriate contact time as indicated for disinfecting solutions.   COVID 19 screen:  No recent travel or known exposure to COVID19 The patient denies respiratory symptoms of COVID 19 at this time. The importance of social distancing was discussed today.   Assessment and Plan Problem List Items Addressed This Visit     Depression, major, recurrent (HCC) (Chronic)    Chronic, well controlled  Doing well on nortriptyline 150 mg p.o. at bedtime.  Refill given      Relevant Medications   nortriptyline (PAMELOR) 50 MG capsule   Essential hypertension, benign (Chronic)    Stable, chronic.  Continue current medication.   On metoprolol 50 mg 2 times daily, may take an extra half tablet as needed for increased palpitations      GAD (generalized anxiety disorder) (Chronic)  Relevant Medications   nortriptyline (PAMELOR) 50 MG capsule   Hyperlipidemia LDL goal <100 (Chronic)    Chronic, due for reeval  She has been able to increase atorvastatin 10 mg to daily without side effects.  We will recheck cholesterol panel today to make sure her LDL is at goal less than 70.  If not when prescription is refilled I will increase the dose.  Patient is agreeable      Aortic atherosclerosis (HCC) - Primary   Other Visit Diagnoses     Other hyperlipidemia       Relevant Orders   Lipid panel   Comprehensive metabolic panel          Kerby Nora, MD

## 2021-08-24 ENCOUNTER — Other Ambulatory Visit (HOSPITAL_COMMUNITY): Payer: Self-pay

## 2021-08-24 ENCOUNTER — Telehealth: Payer: Self-pay | Admitting: Family Medicine

## 2021-08-24 MED ORDER — ALPRAZOLAM 0.5 MG PO TABS
0.5000 mg | ORAL_TABLET | Freq: Three times a day (TID) | ORAL | 1 refills | Status: DC | PRN
  Filled 2021-08-24: qty 90, 30d supply, fill #0
  Filled 2021-09-22: qty 90, 30d supply, fill #1

## 2021-08-24 MED ORDER — ATORVASTATIN CALCIUM 10 MG PO TABS
10.0000 mg | ORAL_TABLET | Freq: Every day | ORAL | 3 refills | Status: DC
  Filled 2021-08-24: qty 90, 90d supply, fill #0

## 2021-08-24 NOTE — Telephone Encounter (Signed)
?  Encourage patient to contact the pharmacy for refills or they can request refills through [facility] ? ?LAST APPOINTMENT DATE:  Please schedule appointment if longer than 1 year ? ?NEXT APPOINTMENT DATE: ? ?MEDICATION:atorvastatin (LIPITOR) 10 MG tablet ? ?Is the patient out of medication?  ? ?PHARMACY:[facility] ? ?Let patient know to contact pharmacy at the end of the day to make sure medication is ready. ? ?Please notify patient to allow 48-72 hours to process ? ?CLINICAL FILLS OUT ALL BELOW:  ? ?LAST REFILL: ? ?QTY: ? ?REFILL DATE: ? ? ? ?OTHER COMMENTS:  ? ? ?Okay for refill? ? ?Please advise ? ? ?  ?

## 2021-08-24 NOTE — Telephone Encounter (Signed)
Refill sent as requested. 

## 2021-08-25 ENCOUNTER — Other Ambulatory Visit (HOSPITAL_COMMUNITY): Payer: Self-pay

## 2021-09-06 DIAGNOSIS — M25511 Pain in right shoulder: Secondary | ICD-10-CM | POA: Diagnosis not present

## 2021-09-06 DIAGNOSIS — Z79899 Other long term (current) drug therapy: Secondary | ICD-10-CM | POA: Diagnosis not present

## 2021-09-06 DIAGNOSIS — M48062 Spinal stenosis, lumbar region with neurogenic claudication: Secondary | ICD-10-CM | POA: Diagnosis not present

## 2021-09-06 DIAGNOSIS — M5416 Radiculopathy, lumbar region: Secondary | ICD-10-CM | POA: Diagnosis not present

## 2021-09-22 ENCOUNTER — Other Ambulatory Visit (HOSPITAL_COMMUNITY): Payer: Self-pay

## 2021-09-22 ENCOUNTER — Other Ambulatory Visit: Payer: Self-pay | Admitting: Family Medicine

## 2021-09-22 ENCOUNTER — Other Ambulatory Visit: Payer: Self-pay | Admitting: Cardiology

## 2021-09-22 MED ORDER — METOPROLOL TARTRATE 50 MG PO TABS
50.0000 mg | ORAL_TABLET | Freq: Two times a day (BID) | ORAL | 0 refills | Status: DC
  Filled 2021-09-22: qty 75, 38d supply, fill #0

## 2021-09-22 MED ORDER — PANTOPRAZOLE SODIUM 40 MG PO TBEC
40.0000 mg | DELAYED_RELEASE_TABLET | Freq: Every day | ORAL | 0 refills | Status: DC
  Filled 2021-09-22: qty 90, 90d supply, fill #0

## 2021-09-29 ENCOUNTER — Other Ambulatory Visit (HOSPITAL_COMMUNITY): Payer: Self-pay

## 2021-09-30 ENCOUNTER — Other Ambulatory Visit (HOSPITAL_COMMUNITY): Payer: Self-pay

## 2021-10-07 ENCOUNTER — Ambulatory Visit (INDEPENDENT_AMBULATORY_CARE_PROVIDER_SITE_OTHER): Payer: Medicare Other | Admitting: *Deleted

## 2021-10-07 DIAGNOSIS — Z780 Asymptomatic menopausal state: Secondary | ICD-10-CM | POA: Diagnosis not present

## 2021-10-07 DIAGNOSIS — Z0000 Encounter for general adult medical examination without abnormal findings: Secondary | ICD-10-CM | POA: Diagnosis not present
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
## 2021-10-07 NOTE — Patient Instructions (Signed)
Tiffany [name] , ?Thank you for taking time to come for your Medicare Wellness Visit. I appreciate your ongoing commitment to your health goals. Please review the following plan we discussed and let me know if I can assist you in the future.  ? ?Screening recommendations/referrals: ?Colonoscopy: no longer required ?Mammogram: declined ?Bone Density: Education provided ?Recommended yearly ophthalmology/optometry visit for glaucoma screening and checkup ?Recommended yearly dental visit for hygiene and checkup ? ?Vaccinations: ?Influenza vaccine: up to date ?Pneumococcal vaccine: up to date ?Tdap vaccine: up to date ?Shingles vaccine: Education provided   ? ?Advanced directives: Education provided ? ?Conditions/risks identified:  ? ? ? ?Preventive Care 3 Years and Older, Female ?Preventive care refers to lifestyle choices and visits with your health care provider that can promote health and wellness. ?What does preventive care include? ?A yearly physical exam. This is also called an annual well check. ?Dental exams once or twice a year. ?Routine eye exams. Ask your health care provider how often you should have your eyes checked. ?Personal lifestyle choices, including: ?Daily care of your teeth and gums. ?Regular physical activity. ?Eating a healthy diet. ?Avoiding tobacco and drug use. ?Limiting alcohol use. ?Practicing safe sex. ?Taking low-dose aspirin every day. ?Taking vitamin and mineral supplements as recommended by your health care provider. ?What happens during an annual well check? ?The services and screenings done by your health care provider during your annual well check will depend on your age, overall health, lifestyle risk factors, and family history of disease. ?Counseling  ?Your health care provider may ask you questions about your: ?Alcohol use. ?Tobacco use. ?Drug use. ?Emotional well-being. ?Home and relationship well-being. ?Sexual activity. ?Eating habits. ?History of falls. ?Memory and ability to  understand (cognition). ?Work and work Astronomer. ?Reproductive health. ?Screening  ?You may have the following tests or measurements: ?Height, weight, and BMI. ?Blood pressure. ?Lipid and cholesterol levels. These may be checked every 5 years, or more frequently if you are over 44 years old. ?Skin check. ?Lung cancer screening. You may have this screening every year starting at age 19 if you have a 30-pack-year history of smoking and currently smoke or have quit within the past 15 years. ?Fecal occult blood test (FOBT) of the stool. You may have this test every year starting at age 3. ?Flexible sigmoidoscopy or colonoscopy. You may have a sigmoidoscopy every 5 years or a colonoscopy every 10 years starting at age 38. ?Hepatitis C blood test. ?Hepatitis B blood test. ?Sexually transmitted disease (STD) testing. ?Diabetes screening. This is done by checking your blood sugar (glucose) after you have not eaten for a while (fasting). You may have this done every 1-3 years. ?Bone density scan. This is done to screen for osteoporosis. You may have this done starting at age 27. ?Mammogram. This may be done every 1-2 years. Talk to your health care provider about how often you should have regular mammograms. ?Talk with your health care provider about your test results, treatment options, and if necessary, the need for more tests. ?Vaccines  ?Your health care provider may recommend certain vaccines, such as: ?Influenza vaccine. This is recommended every year. ?Tetanus, diphtheria, and acellular pertussis (Tdap, Td) vaccine. You may need a Td booster every 10 years. ?Zoster vaccine. You may need this after age 22. ?Pneumococcal 13-valent conjugate (PCV13) vaccine. One dose is recommended after age 16. ?Pneumococcal polysaccharide (PPSV23) vaccine. One dose is recommended after age 33. ?Talk to your health care provider about which screenings and vaccines you need and how  often you need them. ?This information is not  intended to replace advice given to you by your health care provider. Make sure you discuss any questions you have with your health care provider. ?Document Released: 06/26/2015 Document Revised: 02/17/2016 Document Reviewed: 03/31/2015 ?Elsevier Interactive Patient Education ? 2017 [place]. ? ?Fall Prevention in the Home ?Falls can cause injuries. They can happen to people of all ages. There are many things you can do to make your home safe and to help prevent falls. ?What can I do on the outside of my home? ?Regularly fix the edges of walkways and driveways and fix any cracks. ?Remove anything that might make you trip as you walk through a door, such as a raised step or threshold. ?Trim any bushes or trees on the path to your home. ?Use bright outdoor lighting. ?Clear any walking paths of anything that might make someone trip, such as rocks or tools. ?Regularly check to see if handrails are loose or broken. Make sure that both sides of any steps have handrails. ?Any raised decks and porches should have guardrails on the edges. ?Have any leaves, snow, or ice cleared regularly. ?Use sand or salt on walking paths during winter. ?Clean up any spills in your garage right away. This includes oil or grease spills. ?What can I do in the bathroom? ?Use night lights. ?Install grab bars by the toilet and in the tub and shower. Do not use towel bars as grab bars. ?Use non-skid mats or decals in the tub or shower. ?If you need to sit down in the shower, use a plastic, non-slip stool. ?Keep the floor dry. Clean up any water that spills on the floor as soon as it happens. ?Remove soap buildup in the tub or shower regularly. ?Attach bath mats securely with double-sided non-slip rug tape. ?Do not have throw rugs and other things on the floor that can make you trip. ?What can I do in the bedroom? ?Use night lights. ?Make sure that you have a light by your bed that is easy to reach. ?Do not use any sheets or blankets that are  too big for your bed. They should not hang down onto the floor. ?Have a firm chair that has side arms. You can use this for support while you get dressed. ?Do not have throw rugs and other things on the floor that can make you trip. ?What can I do in the kitchen? ?Clean up any spills right away. ?Avoid walking on wet floors. ?Keep items that you use a lot in easy-to-reach places. ?If you need to reach something above you, use a strong step stool that has a grab bar. ?Keep electrical cords out of the way. ?Do not use floor polish or wax that makes floors slippery. If you must use wax, use non-skid floor wax. ?Do not have throw rugs and other things on the floor that can make you trip. ?What can I do with my stairs? ?Do not leave any items on the stairs. ?Make sure that there are handrails on both sides of the stairs and use them. Fix handrails that are broken or loose. Make sure that handrails are as long as the stairways. ?Check any carpeting to make sure that it is firmly attached to the stairs. Fix any carpet that is loose or worn. ?Avoid having throw rugs at the top or bottom of the stairs. If you do have throw rugs, attach them to the floor with carpet tape. ?Make sure that you have a  light switch at the top of the stairs and the bottom of the stairs. If you do not have them, ask someone to add them for you. ?What else can I do to help prevent falls? ?Wear shoes that: ?Do not have high heels. ?Have rubber bottoms. ?Are comfortable and fit you well. ?Are closed at the toe. Do not wear sandals. ?If you use a stepladder: ?Make sure that it is fully opened. Do not climb a closed stepladder. ?Make sure that both sides of the stepladder are locked into place. ?Ask someone to hold it for you, if possible. ?Clearly mark and make sure that you can see: ?Any grab bars or handrails. ?First and last steps. ?Where the edge of each step is. ?Use tools that help you move around (mobility aids) if they are needed. These  include: ?Canes. ?Walkers. ?Scooters. ?Crutches. ?Turn on the lights when you go into a dark area. Replace any light bulbs as soon as they burn out. ?Set up your furniture so you have a clear path. Avoid moving your

## 2021-10-11 DIAGNOSIS — M542 Cervicalgia: Secondary | ICD-10-CM | POA: Diagnosis not present

## 2021-10-11 DIAGNOSIS — M25511 Pain in right shoulder: Secondary | ICD-10-CM | POA: Diagnosis not present

## 2021-10-12 DIAGNOSIS — M542 Cervicalgia: Secondary | ICD-10-CM | POA: Diagnosis not present

## 2021-10-22 ENCOUNTER — Other Ambulatory Visit: Payer: Self-pay

## 2021-10-22 ENCOUNTER — Other Ambulatory Visit (HOSPITAL_COMMUNITY): Payer: Self-pay

## 2021-10-25 ENCOUNTER — Other Ambulatory Visit: Payer: Self-pay

## 2021-10-25 ENCOUNTER — Other Ambulatory Visit (HOSPITAL_COMMUNITY): Payer: Self-pay

## 2021-10-25 MED ORDER — ALPRAZOLAM 0.25 MG PO TABS
0.2500 mg | ORAL_TABLET | Freq: Three times a day (TID) | ORAL | 2 refills | Status: DC | PRN
  Filled 2021-10-25: qty 90, 30d supply, fill #0
  Filled 2021-11-25: qty 90, 30d supply, fill #1
  Filled 2021-12-23: qty 90, 30d supply, fill #2

## 2021-10-27 ENCOUNTER — Other Ambulatory Visit (HOSPITAL_COMMUNITY): Payer: Self-pay

## 2021-10-28 DIAGNOSIS — M5416 Radiculopathy, lumbar region: Secondary | ICD-10-CM | POA: Diagnosis not present

## 2021-10-28 DIAGNOSIS — G894 Chronic pain syndrome: Secondary | ICD-10-CM | POA: Diagnosis not present

## 2021-10-28 DIAGNOSIS — Z79899 Other long term (current) drug therapy: Secondary | ICD-10-CM | POA: Diagnosis not present

## 2021-10-28 DIAGNOSIS — M25511 Pain in right shoulder: Secondary | ICD-10-CM | POA: Diagnosis not present
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
## 2021-11-09 ENCOUNTER — Other Ambulatory Visit (HOSPITAL_COMMUNITY): Payer: Self-pay

## 2021-11-09 ENCOUNTER — Ambulatory Visit: Payer: Medicare Other | Admitting: Cardiology

## 2021-11-09 ENCOUNTER — Encounter: Payer: Self-pay | Admitting: Cardiology

## 2021-11-09 VITALS — BP 120/68 | HR 72 | Ht 63.5 in | Wt 127.2 lb

## 2021-11-09 DIAGNOSIS — I10 Essential (primary) hypertension: Secondary | ICD-10-CM

## 2021-11-09 DIAGNOSIS — I059 Rheumatic mitral valve disease, unspecified: Secondary | ICD-10-CM

## 2021-11-09 DIAGNOSIS — I700 Atherosclerosis of aorta: Secondary | ICD-10-CM | POA: Diagnosis not present

## 2021-11-09 DIAGNOSIS — R002 Palpitations: Secondary | ICD-10-CM | POA: Diagnosis not present

## 2021-11-09 DIAGNOSIS — E785 Hyperlipidemia, unspecified: Secondary | ICD-10-CM

## 2021-11-09 DIAGNOSIS — I493 Ventricular premature depolarization: Secondary | ICD-10-CM

## 2021-11-09 MED ORDER — ATORVASTATIN CALCIUM 10 MG PO TABS
10.0000 mg | ORAL_TABLET | Freq: Every day | ORAL | 3 refills | Status: DC
  Filled 2021-11-09: qty 90, 90d supply, fill #0
  Filled 2022-01-07 – 2022-02-23 (×2): qty 90, 90d supply, fill #1
  Filled 2022-05-20: qty 90, 90d supply, fill #2
  Filled 2022-08-30: qty 90, 90d supply, fill #3

## 2021-11-09 MED ORDER — METOPROLOL TARTRATE 50 MG PO TABS
50.0000 mg | ORAL_TABLET | Freq: Two times a day (BID) | ORAL | 3 refills | Status: DC
  Filled 2021-11-09: qty 185, 74d supply, fill #0
  Filled 2022-02-23: qty 185, 74d supply, fill #1
  Filled 2022-05-20: qty 185, 74d supply, fill #2
  Filled 2022-08-16: qty 185, 74d supply, fill #3

## 2021-11-09 NOTE — Patient Instructions (Signed)
Medication Instructions:  ?Your physician recommends that you continue on your current medications as directed. Please refer to the Current Medication list given to you today.  ? ?*If you need a refill on your cardiac medications before your next appointment, please call your pharmacy* ? ? ?Lab Work: ?NONE ordered at this time of appointment  ? ?If you have labs (blood work) drawn today and your tests are completely normal, you will receive your results only by: ?MyChart Message (if you have MyChart) OR ?A paper copy in the mail ?If you have any lab test that is abnormal or we need to change your treatment, we will call you to review the results. ? ? ?Testing/Procedures: ?NONE ordered at this time of appointment  ? ? ? ?Follow-Up: ?At [facility], you and your health needs are our priority.  As part of our continuing mission to provide you with exceptional heart care, we have created designated Provider Care Teams.  These Care Teams include your primary Cardiologist (physician) and Advanced Practice Providers (APPs -  Physician Assistants and Nurse Practitioners) who all work together to provide you with the care you need, when you need it. ? ?We recommend signing up for the patient portal called "MyChart".  Sign up information is provided on this After Visit Summary.  MyChart is used to connect with patients for Virtual Visits (Telemedicine).  Patients are able to view lab/test results, encounter notes, upcoming appointments, etc.  Non-urgent messages can be sent to your provider as well.   ?To learn more about what you can do with MyChart, go to [email or web address].   ? ?Your next appointment:   ?1 year(s) ? ?The format for your next appointment:   ?In Person ? ?Provider:   ?David Harding, MD   ? ? ?Other Instructions ? ? ?Important Information About Sugar ? ? ? ? ? ? ?

## 2021-11-11 ENCOUNTER — Encounter (HOSPITAL_COMMUNITY): Payer: Self-pay | Admitting: Emergency Medicine

## 2021-11-11 ENCOUNTER — Other Ambulatory Visit: Payer: Self-pay

## 2021-11-11 ENCOUNTER — Emergency Department (HOSPITAL_COMMUNITY)
Admission: EM | Admit: 2021-11-11 | Discharge: 2021-11-11 | Disposition: A | Discharge status: Home / Self Care | Payer: Medicare Other | Attending: Emergency Medicine | Admitting: Emergency Medicine

## 2021-11-11 DIAGNOSIS — S81812A Laceration without foreign body, left lower leg, initial encounter: Secondary | ICD-10-CM | POA: Diagnosis not present

## 2021-11-11 DIAGNOSIS — Z91040 Latex allergy status: Secondary | ICD-10-CM | POA: Diagnosis not present

## 2021-11-11 DIAGNOSIS — S8992XA Unspecified injury of left lower leg, initial encounter: Secondary | ICD-10-CM | POA: Diagnosis present

## 2021-11-11 DIAGNOSIS — W540XXA Bitten by dog, initial encounter: Secondary | ICD-10-CM | POA: Diagnosis not present

## 2021-11-11 DIAGNOSIS — S81852A Open bite, left lower leg, initial encounter: Secondary | ICD-10-CM | POA: Insufficient documentation

## 2021-11-11 DIAGNOSIS — I10 Essential (primary) hypertension: Secondary | ICD-10-CM | POA: Diagnosis not present

## 2021-11-11 DIAGNOSIS — Z79899 Other long term (current) drug therapy: Secondary | ICD-10-CM | POA: Insufficient documentation

## 2021-11-11 MED ORDER — OXYCODONE HCL 5 MG PO TABS
5.0000 mg | ORAL_TABLET | Freq: Once | ORAL | Status: AC
  Administered 2021-11-11: 5 mg via ORAL
  Filled 2021-11-11: qty 1

## 2021-11-11 MED ORDER — LIDOCAINE-EPINEPHRINE 2 %-1:200000 IJ SOLN
20.0000 mL | Freq: Once | INTRAMUSCULAR | Status: AC
Start: 2021-11-11 — End: 2021-11-11
  Administered 2021-11-11: 20 mL
  Filled 2021-11-11: qty 20

## 2021-11-11 MED ORDER — LIDOCAINE-EPINEPHRINE 2 %-1:100000 IJ SOLN: 20.0000 mL | Freq: Once | INTRAMUSCULAR | Status: DC

## 2021-11-11 MED ORDER — CEPHALEXIN 250 MG PO CAPS
500.0000 mg | ORAL_CAPSULE | Freq: Once | ORAL | Status: AC
  Administered 2021-11-11: 500 mg via ORAL
  Filled 2021-11-11: qty 2

## 2021-11-11 MED ORDER — LIDOCAINE-EPINEPHRINE 1 %-1:100000 IJ SOLN: 20.0000 mL | Freq: Once | INTRAMUSCULAR | Status: DC

## 2021-11-11 MED ORDER — CEPHALEXIN 500 MG PO CAPS: 500.0000 mg | ORAL_CAPSULE | Freq: Four times a day (QID) | ORAL | 0 refills | Status: AC

## 2021-11-11 MED ORDER — OXYCODONE HCL 5 MG PO TABS
5.0000 mg | ORAL_TABLET | Freq: Three times a day (TID) | ORAL | 0 refills | Status: AC | PRN
  Filled 2021-11-11: qty 9, 3d supply, fill #0

## 2021-11-11 NOTE — ED Notes (Signed)
Suture card at bedside, wound irrigation started with betadine/sterile water solution.

## 2021-11-11 NOTE — ED Triage Notes (Signed)
Pt here with dog bite to LLE. Adipose tissue showing, bleeding controlled at this time. Dog is pt's grandson's, unsure if dog's vaccines are up to date.

## 2021-11-11 NOTE — ED Provider Triage Note (Signed)
Emergency Medicine Provider Triage Evaluation Note  Tiffany [name] , a 76 y.o. female  was evaluated in triage.  Pt complains of animal bite.  Patient reports her grandson's dog bit her on her left leg.  Patient unsure of vaccination status of dog.  Patient states last tetanus was 2 years ago.  Patient wound irrigated in triage, bandaged.  Please see provided picture for further details.  Review of Systems  Positive:  Negative:   Physical Exam  BP (!) 142/84 (BP Location: Right Arm)   Pulse 67   Temp 98.6 F (37 C) (Oral)   Resp 17   SpO2 98%  Gen:   Awake, no distress   Resp:  Normal effort  MSK:   Moves extremities without difficulty  Other:     Medical Decision Making  Medically screening exam initiated at 9:49 PM.  Appropriate orders placed.  ASYRIA KOLANDER was informed that the remainder of the evaluation will be completed by another provider, this initial triage assessment does not replace that evaluation, and the importance of remaining in the ED until their evaluation is complete.     Al Decant, PA-C 11/11/21 2150

## 2021-11-11 NOTE — ED Provider Notes (Signed)
[facility] EMERGENCY DEPARTMENT Provider Note   CSN: [id number] Arrival date & time: 11/11/21  2133     History  Chief Complaint  Patient presents with   Animal Bite    Tiffany [name] is a 76 y.o. female with a history of HTN and HLD presenting to the ED with a dog bite.  Patient states that she was going to open a gate when her grandson's dog bit her on the left shin.  She did not fall or hit her head during the incident.  She notes a large wound to the left shin but otherwise denies any other injuries.  She has been able to bear weight since the incident.  She states that she has had a tetanus shot within the past 5 years.  She spoke with her grandson and they are not sure if the dog is up-to-date on his shots.   Animal Bite     Home Medications Prior to Admission medications   Medication Sig Start Date End Date Taking? Authorizing Provider  cephALEXin (KEFLEX) 500 MG capsule Take 1 capsule (500 mg total) by mouth 4 (four) times daily for 7 days. 11/11/21 11/18/21 Yes Laurence Compton, MD  oxyCODONE (ROXICODONE) 5 MG immediate release tablet Take 1 tablet (5 mg total) by mouth every 8 (eight) hours as needed for up to 3 days for severe pain. 11/11/21 11/14/21 Yes Laurence Compton, MD  ALPRAZolam Prudy Feeler) 0.25 MG tablet Take 1 tablet (0.25 mg total) by mouth every 8 (eight) hours as needed. 10/25/21     atorvastatin (LIPITOR) 10 MG tablet Take 1 tablet (10 mg total) by mouth daily. 11/09/21   Marykay Lex, MD  Cholecalciferol (VITAMIN D3 PO) Take 1 tablet by mouth daily.    [provider]  methocarbamol (ROBAXIN) 750 MG tablet Take 1 tablet (750 mg total) by mouth 4 (four) times daily as needed. 12/31/20     methocarbamol (ROBAXIN) 750 MG tablet Take 1 tablet (750 mg total) by mouth 4 (four) times daily as needed. 04/19/21     metoprolol tartrate (LOPRESSOR) 50 MG tablet Take 1 tablet (50 mg total) by mouth 2 (two) times daily. May take an extra 1/2 tablet ( 25 mg  ) as needed for increase palpitations 11/09/21   Marykay Lex, MD  nortriptyline (PAMELOR) 50 MG capsule Take 3 capsules (150 mg total) by mouth at bedtime. 08/20/21   Bedsole, Amy E, MD  pantoprazole (PROTONIX) 40 MG tablet Take 1 tablet (40 mg total) by mouth daily. 09/22/21   Bedsole, Amy E, MD  sennosides-docusate sodium (SENOKOT-S) 8.6-50 MG tablet Take 2 tablets by mouth daily. 10/20/20   Armida Sans, PA-C  vitamin B-12 (CYANOCOBALAMIN) 1000 MCG tablet Take 1,000 mcg by mouth daily.    [provider]      Allergies    Aspirin, Latex, Codeine, Fexofenadine, Nsaids, Tramadol, and Zolpidem tartrate    Review of Systems   Review of Systems  Skin:  Positive for wound.   Physical Exam Updated Vital Signs BP 135/90   Pulse 87   Temp 98.6 F (37 C)   Resp 17   SpO2 98%  Physical Exam Vitals and nursing note reviewed.  Constitutional:      General: She is not in acute distress.    Appearance: She is not toxic-appearing or diaphoretic.     Comments: Elderly.  HENT:     Head: Normocephalic and atraumatic.     Nose: Nose normal.  Cardiovascular:  Rate and Rhythm: Normal rate and regular rhythm.     Pulses: Normal pulses.  Pulmonary:     Effort: Pulmonary effort is normal. No respiratory distress.  Musculoskeletal:     Cervical back: Neck supple.     Right lower leg: No edema.     Left lower leg: No edema.     Comments: No bony tenderness or deformity.  Skin:    General: Skin is warm and dry.     Comments: 6x6cm circular skin defect/wound down to muscle over the left shin. There is an additional 1.5cm laceration at about 5 o'clock .  Neurological:     General: No focal deficit present.     Mental Status: She is alert and oriented to person, place, and time.     Sensory: No sensory deficit.     Motor: No weakness.    ED Results / Procedures / Treatments   Labs (all labs ordered are listed, but only abnormal results are displayed) Labs Reviewed - No data  to display  EKG None  Radiology No results found.  Procedures .Marland KitchenLaceration Repair  Date/Time: 11/11/2021 11:42 PM Performed by: Laurence Compton, MD Authorized by: Charlynne Pander, MD   Consent:    Consent obtained:  Verbal   Consent given by:  Patient   Risks, benefits, and alternatives were discussed: yes     Risks discussed:  Infection, pain and need for additional repair   Alternatives discussed:  No treatment Anesthesia:    Anesthesia method:  Local infiltration   Local anesthetic:  Lidocaine 1% WITH epi Laceration details:    Location:  Leg   Leg location:  L lower leg   Length (cm):  6 Treatment:    Area cleansed with:  Saline   Amount of cleaning:  Extensive   Irrigation solution:  Sterile saline (betadine)   Irrigation volume:  1.5L   Irrigation method:  Syringe   Debridement:  None Skin repair:    Repair method:  Sutures   Suture size:  4-0   Suture material:  Prolene   Suture technique:  Simple interrupted   Number of sutures:  3 Approximation:    Approximation:  Close (rest of wound was left open) Repair type:    Repair type:  Simple Post-procedure details:    Dressing:  Non-adherent dressing (xeroform)   Procedure completion:  Tolerated    Medications Ordered in ED Medications  lidocaine-EPINEPHrine (XYLOCAINE W/EPI) 2 %-1:200000 (PF) injection 20 mL (20 mLs Infiltration Given by Other 11/11/21 2233)  cephALEXin (KEFLEX) capsule 500 mg (500 mg Oral Given 11/11/21 2318)  oxyCODONE (Oxy IR/ROXICODONE) immediate release tablet 5 mg (5 mg Oral Given 11/11/21 2318)    ED Course/ Medical Decision Making/ A&P                           Medical Decision Making Risk Prescription drug management.   76yo female with a history of HLD and HTN presenting to the ED with a dog bit to the left shin.  On the left shin there is a 6x6cm circular wound/skin defect down to the muscle. Does not involve muscle. There is a small laceration at ~5 o'clock. She is  neurologically intact distal to the injury.  The wound was thoroughly explored and there were no foreign bodies noted within the wound.  Wound was extensively cleansed with Betadine/normal saline.  Unfortunately, the skin defect is so large that closure is not possible.  The laceration about 5:00 was sutured as per the procedure note above.  The rest of the wound was left open and iodinated Xeroform gauze was placed within the wound and covered with a nonstick dressing and wrapped.  Advised the patient that she will need to follow-up with plastic surgery and she was given the phone number for Dr. Krista BlueSinger to follow-up within the next week.  Patient's tetanus is up-to-date.  She is not sure if the dog has had its rabies vaccination series.  However, she states that her grandson can watch the dog for the next 10 days.  I spoke with her at length about the importance of keeping close eyes on the dog for a full 10 days and to return to the emergency department if they note any behavioral or health changes in the dog for likely rabies vaccination.  Patient verbalizes understanding.  Patient given a dose of oxycodone for pain in the ED.  Also given a dose of Keflex while in the ED.  I have instructed her to start Keflex, 4 times daily for 7 days.  Strict return precautions were discussed and the patient was discharged home in stable condition.        Final Clinical Impression(s) / ED Diagnoses Final diagnoses:  Dog bite, initial encounter    Rx / DC Orders ED Discharge Orders          Ordered    cephALEXin (KEFLEX) 500 MG capsule  4 times daily        11/11/21 2257    oxyCODONE (ROXICODONE) 5 MG immediate release tablet  Every 8 hours PRN        11/11/21 2312              Laurence ComptonFoster, Kavi Almquist, MD 11/11/21 2343    Charlynne PanderYao, David Hsienta, MD 11/17/21 1529

## 2021-11-11 NOTE — Discharge Instructions (Addendum)
Start Keflex, 1 capsule, 4 times a day, for 7 days. You have 3 sutures in place over the right shin that need to be removed in 10-14 days. Please call Plastic Surgery (Dr. Kelly Splinter) for a follow-up appointment for next week for further wound management. Keep the current dressing in place until your follow-up with Plastic Surgery next week. Please ensure that the dog is being watched closely over the next 10 days - if the dog has any changes in health or behavior, please immediately return to the Emergency Department for possible rabies vaccination.

## 2021-11-12 ENCOUNTER — Other Ambulatory Visit (HOSPITAL_COMMUNITY): Payer: Self-pay

## 2021-11-12 DIAGNOSIS — M4150 Other secondary scoliosis, site unspecified: Secondary | ICD-10-CM | POA: Diagnosis not present

## 2021-11-12 DIAGNOSIS — M542 Cervicalgia: Secondary | ICD-10-CM | POA: Diagnosis not present

## 2021-11-12 DIAGNOSIS — M4712 Other spondylosis with myelopathy, cervical region: Secondary | ICD-10-CM | POA: Diagnosis not present

## 2021-11-12 MED ORDER — HYDROCODONE-ACETAMINOPHEN 5-325 MG PO TABS
1.0000 | ORAL_TABLET | Freq: Four times a day (QID) | ORAL | 0 refills | Status: DC | PRN
  Filled 2021-11-12 – 2021-11-15 (×2): qty 16, 4d supply, fill #0

## 2021-11-12 MED ORDER — CEPHALEXIN 500 MG PO CAPS
500.0000 mg | ORAL_CAPSULE | Freq: Four times a day (QID) | ORAL | 0 refills | Status: DC
  Filled 2021-11-12: qty 28, 7d supply, fill #0

## 2021-11-15 ENCOUNTER — Ambulatory Visit: Payer: Medicare Other | Admitting: Plastic Surgery

## 2021-11-15 ENCOUNTER — Encounter: Payer: Self-pay | Admitting: Plastic Surgery

## 2021-11-15 ENCOUNTER — Other Ambulatory Visit (HOSPITAL_COMMUNITY): Payer: Self-pay

## 2021-11-15 VITALS — BP 119/56 | HR 95 | Ht 63.5 in | Wt 128.8 lb

## 2021-11-15 DIAGNOSIS — W540XXA Bitten by dog, initial encounter: Secondary | ICD-10-CM | POA: Diagnosis not present

## 2021-11-15 DIAGNOSIS — S91052S Open bite, left ankle, sequela: Secondary | ICD-10-CM

## 2021-11-15 DIAGNOSIS — S81852A Open bite, left lower leg, initial encounter: Secondary | ICD-10-CM | POA: Diagnosis not present
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
## 2021-11-22 ENCOUNTER — Telehealth: Payer: Self-pay | Admitting: *Deleted

## 2021-11-22 ENCOUNTER — Ambulatory Visit: Payer: Medicare Other | Admitting: Plastic Surgery

## 2021-11-22 DIAGNOSIS — W540XXA Bitten by dog, initial encounter: Secondary | ICD-10-CM | POA: Diagnosis not present

## 2021-11-22 DIAGNOSIS — S91052A Open bite, left ankle, initial encounter: Secondary | ICD-10-CM | POA: Diagnosis not present

## 2021-11-22 DIAGNOSIS — W540XXS Bitten by dog, sequela: Secondary | ICD-10-CM

## 2021-11-22 DIAGNOSIS — I872 Venous insufficiency (chronic) (peripheral): Secondary | ICD-10-CM | POA: Diagnosis not present

## 2021-11-22 NOTE — Telephone Encounter (Signed)
Spoke to Tiffany [name] in response to vm she left on clinical line stating that her wound has an odor and she is finished with antibiotics. Discussed with Dr. Domenica Reamer who requests pt to be seen today in clinic if possible. Call transferred to front to schedule appointment

## 2021-11-22 NOTE — Telephone Encounter (Signed)
Received on (11/19/21) via of fax Urgent:Documentation Needed from [facility].  Attention, there are some issues delaying service to your patient.  Please provide [facility] with wound DEPTH to make an accurate supply assessment for your patient.  The wound information provided needs to be clarified to ensure prompt service.  Please contact [facility] or update the orders for the patient and resubmit.  Wound(s):Left leg  Called [facility] and spoke with Barbara Cower and gave him the wound measurements:(7x8.5x2).  Barbara Cower verbalized understanding and agreed.//AB/CMA

## 2021-11-23 ENCOUNTER — Encounter: Payer: Self-pay | Admitting: Cardiology

## 2021-11-23 NOTE — Assessment & Plan Note (Signed)
Well-controlled on moderate dose Toprol.  Overall doing better with lower dose.

## 2021-11-23 NOTE — Assessment & Plan Note (Signed)
Well-controlled on lower dose of Toprol.  She is currently on 50 mg and has not required any additional doses.

## 2021-11-23 NOTE — Assessment & Plan Note (Signed)
On appropriate risk factor lowering medications including atorvastatin reducing LDL down to almost 70.  BP controlled.  Nondiabetic.

## 2021-11-23 NOTE — Assessment & Plan Note (Signed)
Most recent echo showed only mild mitral prolapse.  I do not suspect this will get any worse.  No indication for SBE prophylaxis.  No plans to recheck echo unless murmur worsens.

## 2021-11-23 NOTE — Assessment & Plan Note (Signed)
Most recent labs from March show LDL of 71.  Pretty well controlled.  She is tolerating the 10 mg rosuvastatin daily.

## 2021-11-23 NOTE — Progress Notes (Signed)
Patient is status post dog bite to left lower extremity and was concerned about infection.  Physical exam Left lower extremity wound with fat in the tissue base and no evidence of infection or cellulitis.  Assessment and plan Plan is still for eventual skin graft or skin graft substitute with staged skin graft.  No concerns for infection.

## 2021-11-23 NOTE — Assessment & Plan Note (Signed)
Blood pressure looks great on moderate dose Toprol.  No change.

## 2021-11-25 ENCOUNTER — Other Ambulatory Visit (HOSPITAL_COMMUNITY): Payer: Self-pay

## 2021-11-26 ENCOUNTER — Inpatient Hospital Stay: Payer: Medicare Other | Admitting: Family Medicine

## 2021-11-29 DIAGNOSIS — M5416 Radiculopathy, lumbar region: Secondary | ICD-10-CM | POA: Diagnosis not present

## 2021-11-29 DIAGNOSIS — M25511 Pain in right shoulder: Secondary | ICD-10-CM | POA: Diagnosis not present

## 2021-11-29 DIAGNOSIS — Z79899 Other long term (current) drug therapy: Secondary | ICD-10-CM | POA: Diagnosis not present

## 2021-11-29 DIAGNOSIS — G894 Chronic pain syndrome: Secondary | ICD-10-CM | POA: Diagnosis not present

## 2021-12-06 ENCOUNTER — Other Ambulatory Visit (HOSPITAL_COMMUNITY): Payer: Self-pay

## 2021-12-10 ENCOUNTER — Telehealth: Payer: Self-pay

## 2021-12-10 NOTE — Telephone Encounter (Signed)
Called patient advised surgery scheduled on 12/23/2021 at 2:30 with arrival time of 12:30pm. Pre-op on 12/17/2021-11:10 and PO 7/21 at 3:00.Surgery schedule sheet was sent via Mychart.
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
## 2021-12-17 ENCOUNTER — Other Ambulatory Visit (HOSPITAL_COMMUNITY): Payer: Self-pay

## 2021-12-17 ENCOUNTER — Ambulatory Visit (INDEPENDENT_AMBULATORY_CARE_PROVIDER_SITE_OTHER): Payer: Medicare Other | Admitting: Physician Assistant

## 2021-12-17 VITALS — BP 119/67 | HR 68 | Temp 98.6°F | Wt 128.7 lb

## 2021-12-17 DIAGNOSIS — W540XXA Bitten by dog, initial encounter: Secondary | ICD-10-CM

## 2021-12-17 DIAGNOSIS — S91052A Open bite, left ankle, initial encounter: Secondary | ICD-10-CM

## 2021-12-17 DIAGNOSIS — S91052S Open bite, left ankle, sequela: Secondary | ICD-10-CM

## 2021-12-17 MED ORDER — OXYCODONE HCL 5 MG PO TABS
5.0000 mg | ORAL_TABLET | Freq: Four times a day (QID) | ORAL | 0 refills | Status: AC | PRN
  Filled 2021-12-17: qty 20, 5d supply, fill #0

## 2021-12-17 MED ORDER — ONDANSETRON 4 MG PO TBDP
4.0000 mg | ORAL_TABLET | Freq: Three times a day (TID) | ORAL | 0 refills | Status: DC | PRN
  Filled 2021-12-17: qty 20, 7d supply, fill #0

## 2021-12-22 ENCOUNTER — Other Ambulatory Visit: Payer: Self-pay

## 2021-12-22 ENCOUNTER — Encounter (HOSPITAL_COMMUNITY): Payer: Self-pay | Admitting: Plastic Surgery

## 2021-12-22 ENCOUNTER — Other Ambulatory Visit (HOSPITAL_COMMUNITY): Payer: Self-pay

## 2021-12-22 NOTE — Pre-Procedure Instructions (Signed)
[facility] [address] [place] [address] Phone: [phone] Fax: [phone]   PCP - [name], MD Cardiologist - Marykay Lex, MD   EKG - 11/09/21 ECHO - 02/11/11 Cardiac Cath - 02/05/01  ERAS Protcol - Clears until 1145  Anesthesia review: Y  Patient verbally denies any shortness of breath, fever, cough and chest pain during phone call   -------------  SDW INSTRUCTIONS given:  Your procedure is scheduled on 12/23/21.  Report to [facility]" at 1215 P.M., and check in at the Admitting office.  Call this number if you have problems the morning of surgery:  [phone]   Remember:  Do not eat after midnight the night before your surgery  You may drink clear liquids until 1145 the morning of your surgery.   Clear liquids allowed are: Water, Non-Citrus Juices (without pulp), Carbonated Beverages, Clear Tea, Black Coffee Only, and Gatorade    Take these medicines the morning of surgery with A SIP OF WATER  ALPRAZolam (XANAX)  metoprolol tartrate (LOPRESSOR) pantoprazole (PROTONIX)  methocarbamol (ROBAXIN)-if needed   As of today, STOP taking any Aspirin (unless otherwise instructed by your surgeon) Aleve, Naproxen, Ibuprofen, Motrin, Advil, Goody's, BC's, all herbal medications, fish oil, and all vitamins.                      Do not wear jewelry, make up, or nail polish            Do not wear lotions, powders, perfumes/colognes, or deodorant.            Do not shave 48 hours prior to surgery.  Men may shave face and neck.            Do not bring valuables to the hospital.            [facility] is not responsible for any belongings or valuables.  Do NOT Smoke (Tobacco/Vaping) 24 hours prior to your procedure If you use a CPAP at night, you may bring all equipment for your overnight stay.   Contacts, glasses, dentures or bridgework may not be worn into surgery.      For patients admitted to the hospital,  discharge time will be determined by your treatment team.   Patients discharged the day of surgery will not be allowed to drive home, and someone needs to stay with them for 24 hours.    Special instructions:   [facility]- Preparing For Surgery  Before surgery, you can play an important role. Because skin is not sterile, your skin needs to be as free of germs as possible. You can reduce the number of germs on your skin by washing with CHG (chlorahexidine gluconate) Soap before surgery.  CHG is an antiseptic cleaner which kills germs and bonds with the skin to continue killing germs even after washing.    Oral Hygiene is also important to reduce your risk of infection.  Remember - BRUSH YOUR TEETH THE MORNING OF SURGERY WITH YOUR REGULAR TOOTHPASTE  Please do not use if you have an allergy to CHG or antibacterial soaps. If your skin becomes reddened/irritated stop using the CHG.  Do not shave (including legs and underarms) for at least 48 hours prior to first CHG shower. It is OK to shave your face.  Please follow these instructions carefully.   Shower the NIGHT BEFORE SURGERY and the MORNING OF SURGERY with DIAL Soap.   Pat yourself dry with  a CLEAN TOWEL.  Wear CLEAN PAJAMAS to bed the night before surgery  Place CLEAN SHEETS on your bed the night of your first shower and DO NOT SLEEP WITH PETS.   Day of Surgery: Please shower morning of surgery  Wear Clean/Comfortable clothing the morning of surgery Do not apply any deodorants/lotions.   Remember to brush your teeth WITH YOUR REGULAR TOOTHPASTE.   Questions were answered. Patient verbalized understanding of instructions.

## 2021-12-22 NOTE — Anesthesia Preprocedure Evaluation (Signed)
Anesthesia Evaluation  Patient identified by MRN, date of birth, ID band Patient awake    Reviewed: Allergy & Precautions, NPO status , Patient's Chart, lab work & pertinent test results  Airway Mallampati: I  TM Distance: >3 FB Neck ROM: Full    Dental no notable dental hx. (+) Edentulous Upper, Upper Dentures   Pulmonary neg pulmonary ROS,    Pulmonary exam normal breath sounds clear to auscultation       Cardiovascular hypertension, Normal cardiovascular exam Rhythm:Regular Rate:Normal     Neuro/Psych PSYCHIATRIC DISORDERS Anxiety Depression  Neuromuscular disease    GI/Hepatic Neg liver ROS, hiatal hernia, GERD  ,  Endo/Other    Renal/GU      Musculoskeletal  (+) Arthritis ,   Abdominal   Peds  Hematology   Anesthesia Other Findings ALL: ASA Latex, codeine, NSAIDs, tradol, allegra  Reproductive/Obstetrics                            Anesthesia Physical Anesthesia Plan  ASA: 3  Anesthesia Plan: General   Post-op Pain Management: Ofirmev IV (intra-op)* and Precedex   Induction: Intravenous  PONV Risk Score and Plan: 4 or greater and Treatment may vary due to age or medical condition, Midazolam and Ondansetron  Airway Management Planned: LMA  Additional Equipment: None  Intra-op Plan:   Post-operative Plan:   Informed Consent: I have reviewed the patients History and Physical, chart, labs and discussed the procedure including the risks, benefits and alternatives for the proposed anesthesia with the patient or authorized representative who has indicated his/her understanding and acceptance.     Dental advisory given  Plan Discussed with:   Anesthesia Plan Comments:        Anesthesia Quick Evaluation

## 2021-12-23 ENCOUNTER — Other Ambulatory Visit (HOSPITAL_COMMUNITY): Payer: Self-pay

## 2021-12-23 ENCOUNTER — Ambulatory Visit (HOSPITAL_BASED_OUTPATIENT_CLINIC_OR_DEPARTMENT_OTHER): Payer: Medicare Other | Admitting: Physician Assistant

## 2021-12-23 ENCOUNTER — Other Ambulatory Visit: Payer: Self-pay

## 2021-12-23 ENCOUNTER — Ambulatory Visit (HOSPITAL_COMMUNITY)
Admission: RE | Admit: 2021-12-23 | Discharge: 2021-12-23 | Disposition: A | Discharge status: Home / Self Care | Payer: Medicare Other | Attending: Plastic Surgery | Admitting: Plastic Surgery

## 2021-12-23 ENCOUNTER — Encounter (HOSPITAL_COMMUNITY): Payer: Self-pay | Admitting: Plastic Surgery

## 2021-12-23 ENCOUNTER — Encounter (HOSPITAL_COMMUNITY)
Admission: RE | Disposition: A | Discharge status: Home / Self Care | Payer: Self-pay | Source: Home / Self Care | Attending: Plastic Surgery

## 2021-12-23 ENCOUNTER — Ambulatory Visit (HOSPITAL_COMMUNITY): Payer: Medicare Other | Admitting: Physician Assistant

## 2021-12-23 DIAGNOSIS — S91052S Open bite, left ankle, sequela: Secondary | ICD-10-CM | POA: Diagnosis not present

## 2021-12-23 DIAGNOSIS — S81802A Unspecified open wound, left lower leg, initial encounter: Secondary | ICD-10-CM | POA: Diagnosis not present

## 2021-12-23 DIAGNOSIS — W540XXS Bitten by dog, sequela: Secondary | ICD-10-CM | POA: Diagnosis not present

## 2021-12-23 DIAGNOSIS — G709 Myoneural disorder, unspecified: Secondary | ICD-10-CM | POA: Insufficient documentation

## 2021-12-23 DIAGNOSIS — F418 Other specified anxiety disorders: Secondary | ICD-10-CM

## 2021-12-23 DIAGNOSIS — W540XXA Bitten by dog, initial encounter: Secondary | ICD-10-CM

## 2021-12-23 DIAGNOSIS — F32A Depression, unspecified: Secondary | ICD-10-CM | POA: Diagnosis not present

## 2021-12-23 DIAGNOSIS — K219 Gastro-esophageal reflux disease without esophagitis: Secondary | ICD-10-CM | POA: Insufficient documentation

## 2021-12-23 DIAGNOSIS — M1990 Unspecified osteoarthritis, unspecified site: Secondary | ICD-10-CM | POA: Insufficient documentation

## 2021-12-23 DIAGNOSIS — I10 Essential (primary) hypertension: Secondary | ICD-10-CM

## 2021-12-23 DIAGNOSIS — S91052A Open bite, left ankle, initial encounter: Secondary | ICD-10-CM

## 2021-12-23 DIAGNOSIS — F419 Anxiety disorder, unspecified: Secondary | ICD-10-CM | POA: Diagnosis not present

## 2021-12-23 HISTORY — PX: IRRIGATION AND DEBRIDEMENT OF WOUND WITH SPLIT THICKNESS SKIN GRAFT: SHX5879

## 2021-12-23 LAB — BASIC METABOLIC PANEL
Anion gap: 11 (ref 5–15)
BUN: 7 mg/dL — ABNORMAL LOW (ref 8–23)
CO2: 25 mmol/L (ref 22–32)
Calcium: 9.1 mg/dL (ref 8.9–10.3)
Chloride: 99 mmol/L (ref 98–111)
Creatinine, Ser: 0.95 mg/dL (ref 0.44–1.00)
GFR, Estimated: >60 mL/min (ref >60)
Glucose, Bld: 94 mg/dL (ref 70–99)
Potassium: 4.3 mmol/L (ref 3.5–5.1)
Sodium: 135 mmol/L (ref 135–145)

## 2021-12-23 LAB — CBC
HCT: 37.9 % (ref 36.0–46.0)
Hemoglobin: 12.2 g/dL (ref 12.0–15.0)
MCH: 30.8 pg (ref 26.0–34.0)
MCHC: 32.2 g/dL (ref 30.0–36.0)
MCV: 95.7 fL (ref 80.0–100.0)
Platelets: 307 10*3/uL (ref 150–400)
RBC: 3.96 MIL/uL (ref 3.87–5.11)
RDW: 14.2 % (ref 11.5–15.5)
WBC: 8 10*3/uL (ref 4.0–10.5)
nRBC: 0 % (ref 0.0–0.2)

## 2021-12-23 SURGERY — IRRIGATION AND DEBRIDEMENT OF WOUND WITH SPLIT THICKNESS SKIN GRAFT
Anesthesia: General | Site: Leg Lower | Laterality: Left

## 2021-12-23 MED ORDER — CHLORHEXIDINE GLUCONATE CLOTH 2 % EX PADS: 6.0000 | MEDICATED_PAD | Freq: Once | CUTANEOUS | Status: DC

## 2021-12-23 MED ORDER — PHENYLEPHRINE 80 MCG/ML (10ML) SYRINGE FOR IV PUSH (FOR BLOOD PRESSURE SUPPORT)
PREFILLED_SYRINGE | INTRAVENOUS | Status: DC | PRN
  Administered 2021-12-23 (×5): 160 ug via INTRAVENOUS

## 2021-12-23 MED ORDER — BUPIVACAINE-EPINEPHRINE (PF) 0.25% -1:200000 IJ SOLN
INTRAMUSCULAR | Status: AC
  Filled 2021-12-23: qty 30

## 2021-12-23 MED ORDER — OXYCODONE HCL 5 MG/5ML PO SOLN: 5.0000 mg | Freq: Once | ORAL | Status: AC | PRN

## 2021-12-23 MED ORDER — CHLORHEXIDINE GLUCONATE 0.12 % MT SOLN
OROMUCOSAL | Status: AC
  Administered 2021-12-23: 15 mL via OROMUCOSAL
  Filled 2021-12-23: qty 15

## 2021-12-23 MED ORDER — FENTANYL CITRATE (PF) 100 MCG/2ML IJ SOLN: 25.0000 ug | INTRAMUSCULAR | Status: DC | PRN

## 2021-12-23 MED ORDER — AMISULPRIDE (ANTIEMETIC) 5 MG/2ML IV SOLN: 10.0000 mg | Freq: Once | INTRAVENOUS | Status: DC | PRN

## 2021-12-23 MED ORDER — EPHEDRINE SULFATE-NACL 50-0.9 MG/10ML-% IV SOSY
PREFILLED_SYRINGE | INTRAVENOUS | Status: DC | PRN
  Administered 2021-12-23 (×2): 10 mg via INTRAVENOUS
  Administered 2021-12-23: 5 mg via INTRAVENOUS

## 2021-12-23 MED ORDER — ONDANSETRON HCL 4 MG/2ML IJ SOLN
INTRAMUSCULAR | Status: AC
  Filled 2021-12-23: qty 2

## 2021-12-23 MED ORDER — PROPOFOL 10 MG/ML IV BOLUS
INTRAVENOUS | Status: DC | PRN
  Administered 2021-12-23: 100 mg via INTRAVENOUS

## 2021-12-23 MED ORDER — PHENYLEPHRINE 80 MCG/ML (10ML) SYRINGE FOR IV PUSH (FOR BLOOD PRESSURE SUPPORT)
PREFILLED_SYRINGE | INTRAVENOUS | Status: AC
  Filled 2021-12-23: qty 10

## 2021-12-23 MED ORDER — FENTANYL CITRATE (PF) 100 MCG/2ML IJ SOLN
INTRAMUSCULAR | Status: DC | PRN
  Administered 2021-12-23: 50 ug via INTRAVENOUS

## 2021-12-23 MED ORDER — PROPOFOL 10 MG/ML IV BOLUS
INTRAVENOUS | Status: AC
  Filled 2021-12-23: qty 20

## 2021-12-23 MED ORDER — LIDOCAINE 2% (20 MG/ML) 5 ML SYRINGE
INTRAMUSCULAR | Status: AC
  Filled 2021-12-23: qty 5

## 2021-12-23 MED ORDER — CEFAZOLIN SODIUM-DEXTROSE 2-4 GM/100ML-% IV SOLN
2.0000 g | INTRAVENOUS | Status: AC
  Administered 2021-12-23: 2 g via INTRAVENOUS
  Filled 2021-12-23: qty 100

## 2021-12-23 MED ORDER — OXYCODONE HCL 5 MG PO TABS
ORAL_TABLET | ORAL | Status: AC
  Filled 2021-12-23: qty 1

## 2021-12-23 MED ORDER — ORAL CARE MOUTH RINSE: 15.0000 mL | Freq: Once | OROMUCOSAL | Status: AC

## 2021-12-23 MED ORDER — LIDOCAINE-EPINEPHRINE 1 %-1:100000 IJ SOLN
INTRAMUSCULAR | Status: AC
  Filled 2021-12-23: qty 1

## 2021-12-23 MED ORDER — LIDOCAINE 2% (20 MG/ML) 5 ML SYRINGE
INTRAMUSCULAR | Status: DC | PRN
  Administered 2021-12-23: 100 mg via INTRAVENOUS

## 2021-12-23 MED ORDER — ONDANSETRON HCL 4 MG/2ML IJ SOLN
4.0000 mg | Freq: Once | INTRAMUSCULAR | Status: DC | PRN
Start: 2021-12-23 — End: 2021-12-23

## 2021-12-23 MED ORDER — 0.9 % SODIUM CHLORIDE (POUR BTL) OPTIME
TOPICAL | Status: DC | PRN
  Administered 2021-12-23: 1000 mL

## 2021-12-23 MED ORDER — EPHEDRINE 5 MG/ML INJ
INTRAVENOUS | Status: AC
  Filled 2021-12-23: qty 5

## 2021-12-23 MED ORDER — MIDAZOLAM HCL 5 MG/5ML IJ SOLN
INTRAMUSCULAR | Status: DC | PRN
  Administered 2021-12-23: 2 mg via INTRAVENOUS

## 2021-12-23 MED ORDER — ACETAMINOPHEN 10 MG/ML IV SOLN
1000.0000 mg | Freq: Once | INTRAVENOUS | Status: DC | PRN
  Administered 2021-12-23: 1000 mg via INTRAVENOUS

## 2021-12-23 MED ORDER — CHLORHEXIDINE GLUCONATE 0.12 % MT SOLN
15.0000 mL | Freq: Once | OROMUCOSAL | Status: AC
Start: 2021-12-23 — End: 2021-12-23

## 2021-12-23 MED ORDER — ONDANSETRON HCL 4 MG/2ML IJ SOLN
INTRAMUSCULAR | Status: DC | PRN
  Administered 2021-12-23: 4 mg via INTRAVENOUS

## 2021-12-23 MED ORDER — OXYCODONE HCL 5 MG PO TABS
5.0000 mg | ORAL_TABLET | Freq: Once | ORAL | Status: AC | PRN
  Administered 2021-12-23: 5 mg via ORAL

## 2021-12-23 MED ORDER — ACETAMINOPHEN 10 MG/ML IV SOLN
INTRAVENOUS | Status: AC
  Filled 2021-12-23: qty 100

## 2021-12-23 MED ORDER — BUPIVACAINE-EPINEPHRINE (PF) 0.25% -1:200000 IJ SOLN
INTRAMUSCULAR | Status: DC | PRN
  Administered 2021-12-23: 20 mL

## 2021-12-23 MED ORDER — MINERAL OIL LIGHT 100 % EX OIL
1.0000 | TOPICAL_OIL | Freq: Once | CUTANEOUS | Status: AC
  Administered 2021-12-23: 3 via TOPICAL
  Filled 2021-12-23 (×3): qty 25

## 2021-12-23 MED ORDER — MIDAZOLAM HCL 2 MG/2ML IJ SOLN
INTRAMUSCULAR | Status: AC
  Filled 2021-12-23: qty 2

## 2021-12-23 MED ORDER — OXYCODONE HCL 5 MG PO TABS
5.0000 mg | ORAL_TABLET | Freq: Four times a day (QID) | ORAL | 0 refills | Status: DC | PRN
  Filled 2021-12-23: qty 15, 4d supply, fill #0

## 2021-12-23 MED ORDER — LACTATED RINGERS IV SOLN: INTRAVENOUS | Status: DC

## 2021-12-23 MED ORDER — FENTANYL CITRATE (PF) 250 MCG/5ML IJ SOLN
INTRAMUSCULAR | Status: AC
  Filled 2021-12-23: qty 5

## 2021-12-23 SURGICAL SUPPLY — 68 items
APPLICATOR COTTON TIP 6 STRL (MISCELLANEOUS) IMPLANT
APPLICATOR COTTON TIP 6IN STRL (MISCELLANEOUS)
BAG COUNTER SPONGE SURGICOUNT (BAG) ×1 IMPLANT
BAG DECANTER FOR FLEXI CONT (MISCELLANEOUS) IMPLANT
BENZOIN TINCTURE PRP APPL 2/3 (GAUZE/BANDAGES/DRESSINGS) ×1 IMPLANT
BLADE DERMATOME SS (BLADE) ×1 IMPLANT
BLADE SURG 11 STRL SS (BLADE) ×1 IMPLANT
BLADE SURG 15 STRL LF DISP TIS (BLADE) IMPLANT
BLADE SURG 15 STRL SS (BLADE) ×2
CANISTER SUCT 3000ML PPV (MISCELLANEOUS) ×2 IMPLANT
CNTNR URN SCR LID CUP LEK RST (MISCELLANEOUS) IMPLANT
CONT SPEC 4OZ STRL OR WHT (MISCELLANEOUS)
COTTONBALL LRG STERILE PKG (GAUZE/BANDAGES/DRESSINGS) ×2 IMPLANT
COVER SURGICAL LIGHT HANDLE (MISCELLANEOUS) ×2 IMPLANT
DERMACARRIERS GRAFT 1 TO 1.5 (DISPOSABLE) ×2
DRAIN CHANNEL 19F RND (DRAIN) IMPLANT
DRAIN JP 10F RND SILICONE (MISCELLANEOUS) IMPLANT
DRAPE HALF SHEET 40X57 (DRAPES) ×1 IMPLANT
DRAPE IMP U-DRAPE 54X76 (DRAPES) ×1 IMPLANT
DRAPE INCISE IOBAN 66X45 STRL (DRAPES) IMPLANT
DRAPE LAPAROSCOPIC ABDOMINAL (DRAPES) IMPLANT
DRAPE LAPAROTOMY 100X72 PEDS (DRAPES) ×1 IMPLANT
DRSG ADAPTIC 3X8 NADH LF (GAUZE/BANDAGES/DRESSINGS) IMPLANT
DRSG MEPILEX BORDER 4X8 (GAUZE/BANDAGES/DRESSINGS) ×1 IMPLANT
DRSG PAD ABDOMINAL 8X10 ST (GAUZE/BANDAGES/DRESSINGS) IMPLANT
DRSG TEGADERM 4X4.75 (GAUZE/BANDAGES/DRESSINGS) ×1 IMPLANT
DRSG TELFA 3X8 NADH (GAUZE/BANDAGES/DRESSINGS) IMPLANT
DRSG VAC ATS LRG SENSATRAC (GAUZE/BANDAGES/DRESSINGS) IMPLANT
DRSG VAC ATS MED SENSATRAC (GAUZE/BANDAGES/DRESSINGS) IMPLANT
DRSG VAC ATS SM SENSATRAC (GAUZE/BANDAGES/DRESSINGS) IMPLANT
ELECT CAUTERY BLADE 6.4 (BLADE) IMPLANT
ELECT REM PT RETURN 9FT ADLT (ELECTROSURGICAL) ×2
ELECTRODE REM PT RTRN 9FT ADLT (ELECTROSURGICAL) ×1 IMPLANT
GAUZE SPONGE 4X4 12PLY STRL (GAUZE/BANDAGES/DRESSINGS) IMPLANT
GAUZE XEROFORM 5X9 LF (GAUZE/BANDAGES/DRESSINGS) ×1 IMPLANT
GLOVE BIO SURGEON STRL SZ 6.5 (GLOVE) ×2 IMPLANT
GLOVE BIOGEL M 6.5 STRL (GLOVE) ×2 IMPLANT
GLOVE SURG ENC MOIS LTX SZ7.5 (GLOVE) ×2 IMPLANT
GOWN STRL REUS W/ TWL LRG LVL3 (GOWN DISPOSABLE) ×1 IMPLANT
GOWN STRL REUS W/TWL LRG LVL3 (GOWN DISPOSABLE) ×2
GOWN STRL REUS W/TWL XL LVL3 (GOWN DISPOSABLE) ×4 IMPLANT
GRAFT DERMACARRIERS 1 TO 1.5 (DISPOSABLE) IMPLANT
KIT BASIN OR (CUSTOM PROCEDURE TRAY) ×2 IMPLANT
KIT TURNOVER KIT B (KITS) ×2 IMPLANT
MARKER SKIN DUAL TIP RULER LAB (MISCELLANEOUS) ×1 IMPLANT
NDL HYPO 25GX1X1/2 BEV (NEEDLE) ×1 IMPLANT
NEEDLE 22X1 1/2 (OR ONLY) (NEEDLE) ×1 IMPLANT
NEEDLE HYPO 25GX1X1/2 BEV (NEEDLE) IMPLANT
NS IRRIG 1000ML POUR BTL (IV SOLUTION) ×2 IMPLANT
PACK GENERAL/GYN (CUSTOM PROCEDURE TRAY) ×2 IMPLANT
PACK UNIVERSAL I (CUSTOM PROCEDURE TRAY) ×2 IMPLANT
PAD ARMBOARD 7.5X6 YLW CONV (MISCELLANEOUS) ×4 IMPLANT
PAD DRESSING TELFA 3X8 NADH (GAUZE/BANDAGES/DRESSINGS) IMPLANT
STAPLER VISISTAT 35W (STAPLE) ×2 IMPLANT
SURGILUBE 2OZ TUBE FLIPTOP (MISCELLANEOUS) IMPLANT
SUT MNCRL AB 3-0 PS2 27 (SUTURE) IMPLANT
SUT MNCRL AB 4-0 PS2 18 (SUTURE) IMPLANT
SUT MON AB 2-0 CT1 36 (SUTURE) IMPLANT
SUT MON AB 5-0 PS2 18 (SUTURE) IMPLANT
SUT SILK 3 0 SH 30 (SUTURE) ×1 IMPLANT
SUT SILK 3 0SH CR/8 30 (SUTURE) ×1 IMPLANT
SUT VIC AB 5-0 PS2 18 (SUTURE) IMPLANT
SUT VICRYL 3 0 (SUTURE) IMPLANT
SWAB COLLECTION DEVICE MRSA (MISCELLANEOUS) IMPLANT
SWAB CULTURE ESWAB REG 1ML (MISCELLANEOUS) IMPLANT
SYR CONTROL 10ML LL (SYRINGE) ×2 IMPLANT
TOWEL GREEN STERILE (TOWEL DISPOSABLE) ×2 IMPLANT
UNDERPAD 30X36 HEAVY ABSORB (UNDERPADS AND DIAPERS) ×1 IMPLANT

## 2021-12-23 NOTE — Op Note (Signed)
Operative Note   DATE OF OPERATION: 12/23/2021  SURGICAL DEPARTMENT: Plastic Surgery  PREOPERATIVE DIAGNOSES:  open wound left lower extremity  POSTOPERATIVE DIAGNOSES:  same  PROCEDURE:   1) debridement of 4 cm x 2.5 cm left lower extremity wound in preparation for skin graft 2) STSG to left lower extremity wound 4 cm x 2.5 cm wound  SURGEON: Jaxden Blyden P. Azaiah Licciardi, MD  ASSISTANT: Evelena Leyden, PA  ANESTHESIA:  General.   COMPLICATIONS: None.   INDICATIONS FOR PROCEDURE:  The patient, Tiffany [name] is a 76 y.o. female born on February 12, 1946, is here for treatment of left lower extremity wound. MRN: [id number]  CONSENT:  Informed consent was obtained directly from the patient. Risks, benefits and alternatives were fully discussed. Specific risks including but not limited to bleeding, infection, hematoma, seroma, scarring, pain, contracture, asymmetry, wound healing problems, and need for further surgery were all discussed. The patient did have an ample opportunity to have questions answered to satisfaction.   DESCRIPTION OF PROCEDURE:  The patient was taken to the operating room. SCDs were placed and preop antibiotics were given. General anesthesia was administered.  The patient's operative site was prepped and draped in a sterile fashion. A time out was performed and all information was confirmed to be correct.    The left lower extremity was prepped and draped in the standard sterile fashioned.  The lower extremity was sharply debrided with curette.  Following this we harvest a skin graft with a 2 cm guard with a Zimmer dermatome.  The skin graft was pie crusted, placed on the wound and stapled in place.  Since this was such a small wound the remaining graft was placed back on the the donor site.  A bolster was placed on the grafted site and a tegaderm was placed on the donor site.  The advanced practice practitioner (APP) assisted throughout the case.  The APP was essential in  retraction and counter traction when needed to make the case progress smoothly.  This retraction and assistance made it possible to see the tissue planes for the procedure.  The assistance was needed for hemostasis, tissue re-approximation and closure of the incision site.    The patient tolerated the procedure well.  There were no complications. The patient was allowed to wake from anesthesia, extubated and taken to the recovery room in satisfactory condition.

## 2021-12-23 NOTE — Transfer of Care (Signed)
Immediate Anesthesia Transfer of Care Note  Patient: Tiffany [name]  Procedure(s) Performed: Debridement and skin graft to the left leg (Left: Leg Lower)  Patient Location: PACU  Anesthesia Type:General  Level of Consciousness: drowsy and patient cooperative  Airway & Oxygen Therapy: Patient Spontanous Breathing and Patient connected to nasal cannula oxygen  Post-op Assessment: Report given to RN and Post -op Vital signs reviewed and stable  Post vital signs: Reviewed and stable  Last Vitals:  Vitals Value Taken Time  BP 114/59 12/23/21 1548  Temp    Pulse 60 12/23/21 1552  Resp 12 12/23/21 1552  SpO2 97 % 12/23/21 1552  Vitals shown include unvalidated device data.  Last Pain:  Vitals:   12/23/21 1303  TempSrc:   PainSc: 0-No pain         Complications: No notable events documented.

## 2021-12-23 NOTE — Discharge Instructions (Signed)
Diet: High protein, low sugar.  Medications: Please take the Zofran as needed for nausea symptoms.  As for pain control, please take Tylenol 500 mg every 6 hours as needed.  The prescribed oxycodone should be taken only as needed for breakthrough pain.  Please note that this drug can make you drowsy.  Wound care: Your lower leg wound was debrided and cleaned. We then harvested skin from your thigh and grafted is on your lower leg wound, secured with a bolster.  The dressings on your thigh and lower leg can remain in place until you are seen in clinic next week.   Activity: Elevated your leg and avoid any strenuous activity until next week.  Continue to walk in effort to mitigate risk of clots.    Mild drainage from underneath the dressing is OK. However, please call the office should you have severe pain or swelling, or if you have any fevers.

## 2021-12-23 NOTE — Anesthesia Postprocedure Evaluation (Signed)
Anesthesia Post Note  Patient: Tiffany [name]  Procedure(s) Performed: Debridement and skin graft to the left leg (Left: Leg Lower)     Patient location during evaluation: PACU Anesthesia Type: General Level of consciousness: awake and alert Pain management: pain level controlled Vital Signs Assessment: post-procedure vital signs reviewed and stable Respiratory status: spontaneous breathing, nonlabored ventilation, respiratory function stable and patient connected to nasal cannula oxygen Cardiovascular status: blood pressure returned to baseline and stable Postop Assessment: no apparent nausea or vomiting Anesthetic complications: no   No notable events documented.  Last Vitals:  Vitals:   12/23/21 1600 12/23/21 1615  BP: 109/65 135/63  Pulse: (!) 59 (!) 54  Resp: 15 12  Temp:  36.6 C  SpO2: 91% 97%    Last Pain:  Vitals:   12/23/21 1600  TempSrc:   PainSc: 7                  Trevor Iha

## 2021-12-23 NOTE — Interval H&P Note (Signed)
History and Physical Interval Note:  12/23/2021 2:40 PM  Tiffany [name]  has presented today for surgery, with the diagnosis of Dog bite  S91.0525 W54.0XXS.  The various methods of treatment have been discussed with the patient and family. After consideration of risks, benefits and other options for treatment, the patient has consented to  Procedure(s): Debridement of left leg, split thickness with skin graft to left leg (Left) as a surgical intervention.  The patient's history has been reviewed, patient examined, no change in status, stable for surgery.  I have reviewed the patient's chart and labs.  Questions were answered to the patient's satisfaction.     Janne Napoleon

## 2021-12-23 NOTE — Anesthesia Procedure Notes (Signed)
Procedure Name: LMA Insertion Date/Time: 12/23/2021 2:55 PM  Performed by: Shireen Quan, CRNAPre-anesthesia Checklist: Patient identified, Emergency Drugs available, Suction available and Patient being monitored Patient Re-evaluated:Patient Re-evaluated prior to induction Oxygen Delivery Method: Circle System Utilized Preoxygenation: Pre-oxygenation with 100% oxygen Induction Type: IV induction Ventilation: Mask ventilation without difficulty LMA: LMA inserted LMA Size: 4.0 Number of attempts: 1 Placement Confirmation: positive ETCO2 Tube secured with: Tape Dental Injury: Teeth and Oropharynx as per pre-operative assessment

## 2021-12-24 ENCOUNTER — Other Ambulatory Visit (HOSPITAL_COMMUNITY): Payer: Self-pay

## 2021-12-24 ENCOUNTER — Encounter (HOSPITAL_COMMUNITY): Payer: Self-pay | Admitting: Plastic Surgery

## 2021-12-29 DIAGNOSIS — I872 Venous insufficiency (chronic) (peripheral): Secondary | ICD-10-CM | POA: Diagnosis not present

## 2021-12-31 ENCOUNTER — Ambulatory Visit (INDEPENDENT_AMBULATORY_CARE_PROVIDER_SITE_OTHER): Payer: Medicare Other | Admitting: Plastic Surgery

## 2021-12-31 DIAGNOSIS — W540XXS Bitten by dog, sequela: Secondary | ICD-10-CM

## 2021-12-31 DIAGNOSIS — W540XXA Bitten by dog, initial encounter: Secondary | ICD-10-CM

## 2021-12-31 DIAGNOSIS — S91052A Open bite, left ankle, initial encounter: Secondary | ICD-10-CM

## 2021-12-31 NOTE — Progress Notes (Signed)
Patient is status post dog bite to the left leg and subsequent skin graft on 12/23/2021.  She is doing well without complaints other than some pain  Physical exam Skin graft is intact and donor site is healing well.  100% take at this time.  Assessment and plan Staples and bolster removed we will see the patient back in a couple weeks to check her progress.

## 2022-01-04 ENCOUNTER — Other Ambulatory Visit: Payer: Self-pay | Admitting: Family Medicine

## 2022-01-04 ENCOUNTER — Other Ambulatory Visit (HOSPITAL_COMMUNITY): Payer: Self-pay

## 2022-01-04 MED ORDER — PANTOPRAZOLE SODIUM 40 MG PO TBEC
40.0000 mg | DELAYED_RELEASE_TABLET | Freq: Every day | ORAL | 0 refills | Status: DC
  Filled 2022-01-04: qty 90, 90d supply, fill #0

## 2022-01-04 NOTE — Telephone Encounter (Signed)
Please schedule CPE with fasting labs prior with Dr. Bedsole. .  Already had her MWV with nurse.   

## 2022-01-04 NOTE — Telephone Encounter (Signed)
Patient has been scheduled

## 2022-01-05 ENCOUNTER — Telehealth: Payer: Self-pay

## 2022-01-05 NOTE — Telephone Encounter (Signed)
Faxed new order and op note to [facility] with confirmed receipt. Sending to scan.

## 2022-01-06 ENCOUNTER — Telehealth: Payer: Self-pay

## 2022-01-06 NOTE — Telephone Encounter (Signed)
Per [facility]: [facility] will be contacting the patient with pricing options. Their insurance plan does not cover the supplies ordered under their benefits as supplies deemed non billable.

## 2022-01-07 ENCOUNTER — Other Ambulatory Visit (HOSPITAL_COMMUNITY): Payer: Self-pay
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
## 2022-01-14 ENCOUNTER — Ambulatory Visit (INDEPENDENT_AMBULATORY_CARE_PROVIDER_SITE_OTHER): Payer: Medicare Other | Admitting: Physician Assistant

## 2022-01-14 DIAGNOSIS — W540XXD Bitten by dog, subsequent encounter: Secondary | ICD-10-CM

## 2022-01-14 DIAGNOSIS — W540XXS Bitten by dog, sequela: Secondary | ICD-10-CM

## 2022-01-14 DIAGNOSIS — S91052D Open bite, left ankle, subsequent encounter: Secondary | ICD-10-CM

## 2022-01-24 ENCOUNTER — Other Ambulatory Visit (HOSPITAL_COMMUNITY): Payer: Self-pay

## 2022-01-25 ENCOUNTER — Other Ambulatory Visit (HOSPITAL_COMMUNITY): Payer: Self-pay

## 2022-01-26 ENCOUNTER — Other Ambulatory Visit (HOSPITAL_COMMUNITY): Payer: Self-pay

## 2022-01-26 MED ORDER — ALPRAZOLAM 0.25 MG PO TABS
0.2500 mg | ORAL_TABLET | Freq: Three times a day (TID) | ORAL | 2 refills | Status: DC | PRN
  Filled 2022-01-26: qty 90, 30d supply, fill #0
  Filled 2022-02-23: qty 90, 30d supply, fill #1

## 2022-01-26 MED ORDER — ALPRAZOLAM 0.25 MG PO TABS
0.2500 mg | ORAL_TABLET | Freq: Three times a day (TID) | ORAL | 2 refills | Status: DC | PRN
  Filled 2022-01-26: qty 90, 30d supply, fill #0

## 2022-01-28 ENCOUNTER — Other Ambulatory Visit (HOSPITAL_COMMUNITY): Payer: Self-pay

## 2022-01-28 ENCOUNTER — Other Ambulatory Visit: Payer: Self-pay | Admitting: Family Medicine

## 2022-01-28 MED ORDER — PANTOPRAZOLE SODIUM 40 MG PO TBEC
40.0000 mg | DELAYED_RELEASE_TABLET | Freq: Every day | ORAL | 0 refills | Status: DC
  Filled 2022-01-28 – 2022-04-13 (×2): qty 90, 90d supply, fill #0

## 2022-02-05 ENCOUNTER — Telehealth: Payer: Self-pay | Admitting: Family Medicine

## 2022-02-05 DIAGNOSIS — Z862 Personal history of diseases of the blood and blood-forming organs and certain disorders involving the immune mechanism: Secondary | ICD-10-CM

## 2022-02-05 DIAGNOSIS — E785 Hyperlipidemia, unspecified: Secondary | ICD-10-CM

## 2022-02-05 NOTE — Telephone Encounter (Signed)
-----   Message from Alvina Chou sent at 02/02/2022  3:26 PM EDT ----- Regarding: Lab orders for Friday, 9.8.23 Patient is scheduled for CPX labs, please order future labs, Thanks , Camelia Eng

## 2022-02-08 ENCOUNTER — Other Ambulatory Visit (HOSPITAL_COMMUNITY): Payer: Self-pay

## 2022-02-08 MED ORDER — ALPRAZOLAM 0.25 MG PO TABS: 0.2500 mg | ORAL_TABLET | Freq: Three times a day (TID) | ORAL | 0 refills | Status: DC | PRN

## 2022-02-15 DIAGNOSIS — M4712 Other spondylosis with myelopathy, cervical region: Secondary | ICD-10-CM | POA: Diagnosis not present

## 2022-02-15 DIAGNOSIS — M48061 Spinal stenosis, lumbar region without neurogenic claudication: Secondary | ICD-10-CM | POA: Diagnosis not present

## 2022-02-18 ENCOUNTER — Other Ambulatory Visit (INDEPENDENT_AMBULATORY_CARE_PROVIDER_SITE_OTHER): Payer: Medicare Other

## 2022-02-18 DIAGNOSIS — E785 Hyperlipidemia, unspecified: Secondary | ICD-10-CM | POA: Diagnosis not present

## 2022-02-18 DIAGNOSIS — Z862 Personal history of diseases of the blood and blood-forming organs and certain disorders involving the immune mechanism: Secondary | ICD-10-CM | POA: Diagnosis not present

## 2022-02-18 LAB — CBC WITH DIFFERENTIAL/PLATELET
Basophils Absolute: 0 10*3/uL (ref 0.0–0.1)
Basophils Relative: 0.8 % (ref 0.0–3.0)
Eosinophils Absolute: 0.3 10*3/uL (ref 0.0–0.7)
Eosinophils Relative: 4.2 % (ref 0.0–5.0)
HCT: 38.5 % (ref 36.0–46.0)
Hemoglobin: 12.6 g/dL (ref 12.0–15.0)
Lymphocytes Relative: 25.3 % (ref 12.0–46.0)
Lymphs Abs: 1.6 10*3/uL (ref 0.7–4.0)
MCHC: 32.9 g/dL (ref 30.0–36.0)
MCV: 94.6 fl (ref 78.0–100.0)
Monocytes Absolute: 0.5 10*3/uL (ref 0.1–1.0)
Monocytes Relative: 8.1 % (ref 3.0–12.0)
Neutro Abs: 3.8 10*3/uL (ref 1.4–7.7)
Neutrophils Relative %: 61.6 % (ref 43.0–77.0)
Platelets: 288 10*3/uL (ref 150.0–400.0)
RBC: 4.07 Mil/uL (ref 3.87–5.11)
RDW: 14.4 % (ref 11.5–15.5)
WBC: 6.1 10*3/uL (ref 4.0–10.5)

## 2022-02-18 LAB — COMPREHENSIVE METABOLIC PANEL
ALT: 15 U/L (ref 0–35)
AST: 20 U/L (ref 0–37)
Albumin: 4.3 g/dL (ref 3.5–5.2)
Alkaline Phosphatase: 96 U/L (ref 39–117)
BUN: 10 mg/dL (ref 6–23)
CO2: 28 mEq/L (ref 19–32)
Calcium: 9.5 mg/dL (ref 8.4–10.5)
Chloride: 100 mEq/L (ref 96–112)
Creatinine, Ser: 1 mg/dL (ref 0.40–1.20)
GFR: 54.19 mL/min — ABNORMAL LOW (ref >60.00)
Glucose, Bld: 68 mg/dL — ABNORMAL LOW (ref 70–99)
Potassium: 3.7 mEq/L (ref 3.5–5.1)
Sodium: 138 mEq/L (ref 135–145)
Total Bilirubin: 0.4 mg/dL (ref 0.2–1.2)
Total Protein: 7.5 g/dL (ref 6.0–8.3)

## 2022-02-18 LAB — LIPID PANEL
Cholesterol: 188 mg/dL (ref 0–200)
HDL: 74.1 mg/dL (ref >39.00)
LDL Cholesterol: 98 mg/dL (ref 0–99)
NonHDL: 114.2
Total CHOL/HDL Ratio: 3
Triglycerides: 82 mg/dL (ref 0.0–149.0)
VLDL: 16.4 mg/dL (ref 0.0–40.0)

## 2022-02-18 NOTE — Progress Notes (Signed)
No critical labs need to be addressed urgently. We will discuss labs in detail at upcoming office visit.   

## 2022-02-23 ENCOUNTER — Other Ambulatory Visit (HOSPITAL_COMMUNITY): Payer: Self-pay

## 2022-02-25 ENCOUNTER — Ambulatory Visit (INDEPENDENT_AMBULATORY_CARE_PROVIDER_SITE_OTHER): Payer: Medicare Other | Admitting: Family Medicine

## 2022-02-25 ENCOUNTER — Telehealth: Payer: Self-pay | Admitting: Family Medicine

## 2022-02-25 ENCOUNTER — Encounter: Payer: Self-pay | Admitting: Family Medicine

## 2022-02-25 ENCOUNTER — Other Ambulatory Visit (HOSPITAL_COMMUNITY): Payer: Self-pay

## 2022-02-25 VITALS — BP 100/60 | HR 90 | Temp 98.6°F | Ht 63.0 in | Wt 126.4 lb

## 2022-02-25 DIAGNOSIS — F411 Generalized anxiety disorder: Secondary | ICD-10-CM

## 2022-02-25 DIAGNOSIS — F331 Major depressive disorder, recurrent, moderate: Secondary | ICD-10-CM

## 2022-02-25 DIAGNOSIS — Z0000 Encounter for general adult medical examination without abnormal findings: Secondary | ICD-10-CM

## 2022-02-25 DIAGNOSIS — I10 Essential (primary) hypertension: Secondary | ICD-10-CM

## 2022-02-25 DIAGNOSIS — I700 Atherosclerosis of aorta: Secondary | ICD-10-CM | POA: Diagnosis not present

## 2022-02-25 DIAGNOSIS — E785 Hyperlipidemia, unspecified: Secondary | ICD-10-CM

## 2022-02-25 DIAGNOSIS — Z23 Encounter for immunization: Secondary | ICD-10-CM | POA: Diagnosis not present

## 2022-02-25 MED ORDER — ALPRAZOLAM 0.5 MG PO TABS
0.5000 mg | ORAL_TABLET | Freq: Every day | ORAL | 0 refills | Status: DC | PRN
  Filled 2022-02-25: qty 30, 30d supply, fill #0

## 2022-02-25 MED ORDER — DULOXETINE HCL 30 MG PO CPEP
30.0000 mg | ORAL_CAPSULE | Freq: Every day | ORAL | 3 refills | Status: DC
  Filled 2022-02-25: qty 30, 30d supply, fill #0
  Filled 2022-03-24: qty 30, 30d supply, fill #1

## 2022-02-25 NOTE — Assessment & Plan Note (Signed)
Chronic, moderate control.  She is interested in weaning off alprazolam and is currently dealing with chronic back and neck pain.  We will start a trial of Cymbalta 30 mg p.o. daily and she will follow-up in 4 weeks for reevaluation.

## 2022-02-25 NOTE — Assessment & Plan Note (Signed)
Chronic, worsening control despite atorvastatin 10 mg daily.  She will get back on track with low fat and low cholesterol diet.

## 2022-02-25 NOTE — Assessment & Plan Note (Signed)
On statin, risk factor reduction

## 2022-02-25 NOTE — Assessment & Plan Note (Signed)
Neck inadequate control and need to come off alprazolam given increased risk at her age.  We discussed how to wean off the alprazolam.  We will start Cymbalta to treat both anxiety, depression and chronic back pain.

## 2022-02-25 NOTE — Assessment & Plan Note (Signed)
Stable, chronic.  Continue current medication.  Metoprolol 50 mg 2 times daily

## 2022-02-25 NOTE — Patient Instructions (Addendum)
Start trial of cymbalta 30 mg daily.  Wean off  alprazolam  gradually as directed.  Get back on track with lean meat.  Can get shingles vaccine at pharmacy.  Please call the location of your choice from the menu below to schedule your Mammogram and/or Bone Density appointment.    [facility]   [facility]                      Phone:  [phone] [address]                               [place], [place] [address]                                                             Services: Traditional and 3D Mammogram, Bone Density   [facility]                 Phone: [phone] [address]                                                       [place], [place] [address]    Service: Bone Density ONLY   *this site does NOT perform mammograms  [facility]                        Phone:  [phone] [address]                                  [place], [place] [address]                                            Services:  3D Mammogram and Bone Density    [facility]   Phone:  [phone]   [address]                                                                            [place], [place] [address]                                            Services: 3D Mammogram and Bone [facility])  Phone:  [phone]   [address]  [place], [place] [address]                                              Services:  3D Mammogram and Bone Density

## 2022-02-25 NOTE — Progress Notes (Signed)
Patient ID: Tiffany [name], female    DOB: 1946/01/25, 76 y.o.   MRN: [id number]  This visit was conducted in person.  BP 100/60   Pulse 90   Temp 98.6 F (37 C) (Oral)   Ht 5\' 3"  (1.6 m)   Wt 126 lb 6 oz (57.3 kg)   SpO2 97%   BMI 22.39 kg/m    CC:  Chief Complaint  Patient presents with   Annual Exam    Part 2  ([facility] 10/07/21)    Subjective:   HPI: Tiffany [name] is a 76 y.o. female presenting on 02/25/2022 for Annual Exam (Part 2  ([facility] 10/07/21))  The patient presents for  complete physical and review of chronic health problems. He/She also has the following acute concerns today:none  She is seeing Dr. Lovell Sheehan for chronic pain... pain MD will not treat with pain medication given she is on benzodiazapine.  MDD, GAD: Stable control on nortriptyline 150 mg p.o. nightly and alprazolam.. she has decreased to once a day  at 6 PM.  Paxil helped in the past.   She is interested in weaning off the alprazolam.  PHQ9:3  GAD7:4  Hypertension:  At goal on beta-blocker for PVCs BP Readings from Last 3 Encounters:  02/25/22 100/60  12/23/21 135/63  12/17/21 119/67  Using medication without problems or lightheadedness:  none Chest pain with exertion: none Edema:none Short of breath:none Average home BPs: Other issues:    Elevated Cholesterol: Improved control on atorvastatin 10 mg p.o. daily not at goal LDL less than 70 (aortic atherosclerosis) Lab Results  Component Value Date   CHOL 188 02/18/2022   HDL 74.10 02/18/2022   LDLCALC 98 02/18/2022   LDLDIRECT 138.7 01/12/2012   TRIG 82.0 02/18/2022   CHOLHDL 3 02/18/2022  Using medications without problems: none Muscle aches: none Diet compliance: moderate.. eating more meat lately Exercise: walking some Other complaints:   Relevant past medical, surgical, family and social history reviewed and updated as indicated. Interim medical history since our last visit reviewed. Allergies and medications reviewed and  updated. Outpatient Medications Prior to Visit  Medication Sig Dispense Refill   ALPRAZolam (XANAX) 0.5 MG tablet Take 0.5 mg by mouth 3 (three) times daily.     atorvastatin (LIPITOR) 10 MG tablet Take 1 tablet (10 mg total) by mouth daily. 90 tablet 3   Cholecalciferol (VITAMIN D3 PO) Take 1 tablet by mouth 3 (three) times a week.     methocarbamol (ROBAXIN) 750 MG tablet Take 1 tablet (750 mg total) by mouth 4 (four) times daily as needed. 120 tablet 4   metoprolol tartrate (LOPRESSOR) 50 MG tablet Take 1 tablet (50 mg total) by mouth 2 (two) times daily. May take an extra 1/2 tablet ( 25 mg ) as needed for increase palpitations 185 tablet 3   nortriptyline (PAMELOR) 50 MG capsule Take 3 capsules (150 mg total) by mouth at bedtime. 270 capsule 1   pantoprazole (PROTONIX) 40 MG tablet Take 1 tablet (40 mg total) by mouth daily. 90 tablet 0   sennosides-docusate sodium (SENOKOT-S) 8.6-50 MG tablet Take 2 tablets by mouth daily. 30 tablet 1   vitamin B-12 (CYANOCOBALAMIN) 1000 MCG tablet Take 1,000 mcg by mouth daily.     ALPRAZolam (XANAX) 0.25 MG tablet Take 1 tablet (0.25 mg total) by mouth every 8 (eight) hours as needed. 90 tablet 2   ALPRAZolam (XANAX) 0.25 MG tablet Take 1 tablet (0.25 mg total) by mouth every 8 (eight)  hours as needed. 90 tablet 2   ALPRAZolam (XANAX) 0.25 MG tablet Take 1 tablet (0.25 mg total) by mouth every 8 (eight) hours as needed. 90 tablet 0   No facility-administered medications prior to visit.     Per HPI unless specifically indicated in ROS section below Review of Systems  Constitutional:  Negative for fatigue and fever.  HENT:  Negative for congestion.   Eyes:  Negative for pain.  Respiratory:  Negative for cough and shortness of breath.   Cardiovascular:  Negative for chest pain, palpitations and leg swelling.  Gastrointestinal:  Negative for abdominal pain.  Genitourinary:  Negative for dysuria and vaginal bleeding.  Musculoskeletal:  Negative for  back pain.  Neurological:  Negative for syncope, light-headedness and headaches.  Psychiatric/Behavioral:  Positive for dysphoric mood. Negative for suicidal ideas. The patient is nervous/anxious.    Objective:  BP 100/60   Pulse 90   Temp 98.6 F (37 C) (Oral)   Ht 5\' 3"  (1.6 m)   Wt 126 lb 6 oz (57.3 kg)   SpO2 97%   BMI 22.39 kg/m   Wt Readings from Last 3 Encounters:  02/25/22 126 lb 6 oz (57.3 kg)  12/22/21 130 lb (59 kg)  12/17/21 128 lb 12 oz (58.4 kg)      Physical Exam Vitals and nursing note reviewed.  Constitutional:      General: She is not in acute distress.    Appearance: Normal appearance. She is well-developed. She is not ill-appearing or toxic-appearing.  HENT:     Head: Normocephalic.     Right Ear: Hearing, tympanic membrane, ear canal and external ear normal.     Left Ear: Hearing, tympanic membrane, ear canal and external ear normal.     Nose: Nose normal.  Eyes:     General: Lids are normal. Lids are everted, no foreign bodies appreciated.     Conjunctiva/sclera: Conjunctivae normal.     Pupils: Pupils are equal, round, and reactive to light.  Neck:     Thyroid: No thyroid mass or thyromegaly.     Vascular: No carotid bruit.     Trachea: Trachea normal.  Cardiovascular:     Rate and Rhythm: Normal rate and regular rhythm.     Heart sounds: Normal heart sounds, S1 normal and S2 normal. No murmur heard.    No gallop.  Pulmonary:     Effort: Pulmonary effort is normal. No respiratory distress.     Breath sounds: Normal breath sounds. No wheezing, rhonchi or rales.  Abdominal:     General: Bowel sounds are normal. There is no distension or abdominal bruit.     Palpations: Abdomen is soft. There is no fluid wave or mass.     Tenderness: There is no abdominal tenderness. There is no guarding or rebound.     Hernia: No hernia is present.  Musculoskeletal:     Cervical back: Normal range of motion and neck supple.  Lymphadenopathy:     Cervical: No  cervical adenopathy.  Skin:    General: Skin is warm and dry.     Findings: No rash.  Neurological:     Mental Status: She is alert.     Cranial Nerves: No cranial nerve deficit.     Sensory: No sensory deficit.  Psychiatric:        Mood and Affect: Mood is not anxious or depressed.        Speech: Speech normal.        Behavior:  Behavior normal. Behavior is cooperative.        Judgment: Judgment normal.       Results for orders placed or performed in visit on 02/18/22  Comprehensive metabolic panel  Result Value Ref Range   Sodium 138 135 - 145 mEq/L   Potassium 3.7 3.5 - 5.1 mEq/L   Chloride 100 96 - 112 mEq/L   CO2 28 19 - 32 mEq/L   Glucose, Bld 68 (L) 70 - 99 mg/dL   BUN 10 6 - 23 mg/dL   Creatinine, Ser 7.06 0.40 - 1.20 mg/dL   Total Bilirubin 0.4 0.2 - 1.2 mg/dL   Alkaline Phosphatase 96 39 - 117 U/L   AST 20 0 - 37 U/L   ALT 15 0 - 35 U/L   Total Protein 7.5 6.0 - 8.3 g/dL   Albumin 4.3 3.5 - 5.2 g/dL   GFR 23.76 (L) >28.31 mL/min   Calcium 9.5 8.4 - 10.5 mg/dL  CBC with Differential/Platelet  Result Value Ref Range   WBC 6.1 4.0 - 10.5 K/uL   RBC 4.07 3.87 - 5.11 Mil/uL   Hemoglobin 12.6 12.0 - 15.0 g/dL   HCT 51.7 61.6 - 07.3 %   MCV 94.6 78.0 - 100.0 fl   MCHC 32.9 30.0 - 36.0 g/dL   RDW 71.0 62.6 - 94.8 %   Platelets 288.0 150.0 - 400.0 K/uL   Neutrophils Relative % 61.6 43.0 - 77.0 %   Lymphocytes Relative 25.3 12.0 - 46.0 %   Monocytes Relative 8.1 3.0 - 12.0 %   Eosinophils Relative 4.2 0.0 - 5.0 %   Basophils Relative 0.8 0.0 - 3.0 %   Neutro Abs 3.8 1.4 - 7.7 K/uL   Lymphs Abs 1.6 0.7 - 4.0 K/uL   Monocytes Absolute 0.5 0.1 - 1.0 K/uL   Eosinophils Absolute 0.3 0.0 - 0.7 K/uL   Basophils Absolute 0.0 0.0 - 0.1 K/uL  Lipid panel  Result Value Ref Range   Cholesterol 188 0 - 200 mg/dL   Triglycerides 54.6 0.0 - 149.0 mg/dL   HDL 27.03 >50.09 mg/dL   VLDL 38.1 0.0 - 82.9 mg/dL   LDL Cholesterol 98 0 - 99 mg/dL   Total CHOL/HDL Ratio 3     NonHDL 114.20      COVID 19 screen:  No recent travel or known exposure to COVID19 The patient denies respiratory symptoms of COVID 19 at this time. The importance of social distancing was discussed today.   Assessment and Plan   The patient's preventative maintenance and recommended screening tests for an annual wellness exam were reviewed in full today. Brought up to date unless services declined.  Counselled on the importance of diet, exercise, and its role in overall health and mortality. The patient's FH and SH was reviewed, including their home life, tobacco status, and drug and alcohol status.  Pending mammogram  cologuard 2018.. no further indicated  Uptodate with vaccines. S/P COVID x 3, flu, PNA Last DEXA 2020 osteoporosis ... Now scheduled.    Problem List Items Addressed This Visit     Aortic atherosclerosis (HCC) (Chronic)    On statin, risk factor reduction      Depression, major, recurrent (HCC) (Chronic)    Chronic, moderate control.  She is interested in weaning off alprazolam and is currently dealing with chronic back and neck pain.  We will start a trial of Cymbalta 30 mg p.o. daily and she will follow-up in 4 weeks for reevaluation.  Relevant Medications   DULoxetine (CYMBALTA) 30 MG capsule   Essential hypertension, benign (Chronic)    Stable, chronic.  Continue current medication.  Metoprolol 50 mg 2 times daily         GAD (generalized anxiety disorder) (Chronic)    Neck inadequate control and need to come off alprazolam given increased risk at her age.  We discussed how to wean off the alprazolam.  We will start Cymbalta to treat both anxiety, depression and chronic back pain.      Relevant Medications   DULoxetine (CYMBALTA) 30 MG capsule   Hyperlipidemia LDL goal <70    Chronic, worsening control despite atorvastatin 10 mg daily.  She will get back on track with low fat and low cholesterol diet.      Other Visit Diagnoses      Routine general medical examination at a health care facility    -  Primary   Need for influenza vaccination       Relevant Orders   Flu Vaccine QUAD High Dose(Fluad) (Completed)      Meds ordered this encounter  Medications   DULoxetine (CYMBALTA) 30 MG capsule    Sig: Take 1 capsule (30 mg total) by mouth daily.    Dispense:  30 capsule    Refill:  3   Orders Placed This Encounter  Procedures   Flu Vaccine QUAD High Dose(Fluad)     Kerby Nora, MD

## 2022-02-25 NOTE — Telephone Encounter (Signed)
Spoke with Ms. Lafever.  She states she had called in a refill on her alprazolam earlier this week but when she went to the pharmacy,  they would not let her have it because it had been discontinued.  Since she uses [facility] and they are connected to Epic, if we discontinue a Rx in patient's chart, it also cancels refills at the pharmacy.  Patient is asking that we resend in refill for her alprazolam.

## 2022-02-25 NOTE — Telephone Encounter (Signed)
Pt called in requesting a call back has questions regarding medication Xanax . Would like a call back #[phone]

## 2022-02-25 NOTE — Telephone Encounter (Signed)
Resent prescription

## 2022-02-28 DIAGNOSIS — Z79899 Other long term (current) drug therapy: Secondary | ICD-10-CM | POA: Diagnosis not present

## 2022-02-28 DIAGNOSIS — G894 Chronic pain syndrome: Secondary | ICD-10-CM | POA: Diagnosis not present

## 2022-02-28 DIAGNOSIS — M5416 Radiculopathy, lumbar region: Secondary | ICD-10-CM | POA: Diagnosis not present

## 2022-02-28 DIAGNOSIS — M25511 Pain in right shoulder: Secondary | ICD-10-CM | POA: Diagnosis not present

## 2022-03-02 ENCOUNTER — Other Ambulatory Visit: Payer: Self-pay | Admitting: Family Medicine

## 2022-03-02 ENCOUNTER — Other Ambulatory Visit (HOSPITAL_COMMUNITY): Payer: Self-pay

## 2022-03-02 MED ORDER — NORTRIPTYLINE HCL 50 MG PO CAPS
150.0000 mg | ORAL_CAPSULE | Freq: Every day | ORAL | 1 refills | Status: DC
  Filled 2022-03-02: qty 270, 90d supply, fill #0
  Filled 2022-05-26: qty 270, 90d supply, fill #1

## 2022-03-02 NOTE — Telephone Encounter (Signed)
Last office visit 02/25/22 for CPE.  Last refilled 08/20/2021 for #[id number] with 1 refill.  Next Appt: 03/25/22 for follow up GAD.

## 2022-03-24 ENCOUNTER — Other Ambulatory Visit (HOSPITAL_COMMUNITY): Payer: Self-pay

## 2022-03-25 ENCOUNTER — Other Ambulatory Visit (HOSPITAL_COMMUNITY): Payer: Self-pay

## 2022-03-25 ENCOUNTER — Ambulatory Visit (INDEPENDENT_AMBULATORY_CARE_PROVIDER_SITE_OTHER): Payer: Medicare Other | Admitting: Family Medicine

## 2022-03-25 ENCOUNTER — Other Ambulatory Visit: Payer: Self-pay | Admitting: Family Medicine

## 2022-03-25 VITALS — BP 120/62 | HR 91 | Temp 97.5°F | Ht 63.0 in | Wt 129.0 lb

## 2022-03-25 DIAGNOSIS — J309 Allergic rhinitis, unspecified: Secondary | ICD-10-CM

## 2022-03-25 DIAGNOSIS — F411 Generalized anxiety disorder: Secondary | ICD-10-CM

## 2022-03-25 DIAGNOSIS — F331 Major depressive disorder, recurrent, moderate: Secondary | ICD-10-CM

## 2022-03-25 MED ORDER — DULOXETINE HCL 60 MG PO CPEP
60.0000 mg | ORAL_CAPSULE | Freq: Every day | ORAL | 3 refills | Status: DC
  Filled 2022-03-25: qty 30, 30d supply, fill #0
  Filled 2022-04-25: qty 30, 30d supply, fill #1
  Filled 2022-05-20: qty 30, 30d supply, fill #2
  Filled 2022-06-22: qty 30, 30d supply, fill #3

## 2022-03-25 MED ORDER — ALPRAZOLAM 0.5 MG PO TABS
0.5000 mg | ORAL_TABLET | Freq: Every day | ORAL | 0 refills | Status: DC | PRN
  Filled 2022-03-25: qty 30, 30d supply, fill #0

## 2022-03-25 NOTE — Patient Instructions (Addendum)
Increase cymbalta to 60 mg daily.  Continue to decrease overtime use alprazolam as able.   Call if mood is not continuing to improve  in 4 week or so an we can consider trying a trial of paxil instead. Per nostril daily for 1-2 weeks .    Continue mucinex and zyrtec.  Add flonase  2 spray per nostril Call if new fever, new Shortness breath,  one sided face pain or  bloody mucus. Call if not improving in 7-10 days.

## 2022-03-25 NOTE — Assessment & Plan Note (Signed)
Chronic, well controlled per GAD-7 but per patient she is unable to wean off the alprazolam fully.  She has been able to decrease to half a tablet alternating with whole tablet every other day.  She would like to continue weaning off but will continue slowly.  We will increase her Cymbalta to 60 mg to hopefully facilitate this.  She will continue nortriptyline to help with sleep at night.  She will call in 3 to 4 weeks for update if her symptoms or not improved as well as call for alprazolam refill if needed.  If she is not improving she would like to try Paxil which she noted works well for her in the past.

## 2022-03-25 NOTE — Assessment & Plan Note (Signed)
Chronic,  good control on low dose cymbalta without side effects, but it has not helped with her chronic neck or low back pain.

## 2022-03-25 NOTE — Telephone Encounter (Signed)
Refill request Alprazolam Last refill 02/25/22 #30 Last office visit 03/25/22

## 2022-03-25 NOTE — Assessment & Plan Note (Signed)
Acute, most likely secondary to exposure to allergen in the store given sudden onset.  She will continue using Mucinex and Zyrtec.  She will add Flonase 2 sprays per nostril daily. There is no sign of bacterial infection and I think COVID and flu infection are unlikely.

## 2022-03-25 NOTE — Progress Notes (Signed)
Patient ID: Tiffany [name], female    DOB: Jan 22, 1946, 76 y.o.   MRN: [id number]  This visit was conducted in person.  BP 120/62   Pulse 91   Temp (!) 97.5 F (36.4 C) (Temporal)   Ht 5\' 3"  (1.6 m)   Wt 129 lb (58.5 kg)   SpO2 99%   BMI 22.85 kg/m    CC:  Chief Complaint  Patient presents with   Follow-up    4 wk-anxiety   Sinusitis    Subjective:   HPI: Tiffany [name] is a 76 y.o. female presenting on 03/25/2022 for Follow-up (4 wk-anxiety) and Sinusitis   MDD, GAD: At last office visit she was interested in weaning off alprazolam given associated complications with aging.  We started a trial of Cymbalta 30 mg daily given setting of chronic mid back and neck pain.  She continued nortriptyline at bedtime for sleep. Today she reports  uncontrolled  anxiety. She has been trying to decrease to 1/2 to 1 whole tablet.  Sleeping well.  HAs good and bad days.  GAD7: 2  PHQ9: 1   She also reports new onset  pain and pressure in forehead...ongoing x 3 days. Started suddenly in a store.   Has nasal congestion, no cough, no SOB. No ear  pain,  some PND and ST>  No fever,  No SOB, no chest pain.  No body aches.   She has started mucinex and zyrtec.      Relevant past medical, surgical, family and social history reviewed and updated as indicated. Interim medical history since our last visit reviewed. Allergies and medications reviewed and updated. Outpatient Medications Prior to Visit  Medication Sig Dispense Refill   ALPRAZolam (XANAX) 0.5 MG tablet Take 1 tablet (0.5 mg total) by mouth daily as needed for anxiety. 30 tablet 0   atorvastatin (LIPITOR) 10 MG tablet Take 1 tablet (10 mg total) by mouth daily. 90 tablet 3   Cholecalciferol (VITAMIN D3 PO) Take 1 tablet by mouth 3 (three) times a week.     DULoxetine (CYMBALTA) 30 MG capsule Take 1 capsule (30 mg total) by mouth daily. 30 capsule 3   HYDROcodone-acetaminophen (NORCO/VICODIN) 5-325 MG tablet Take 1  tablet by mouth 4 (four) times daily as needed.     methocarbamol (ROBAXIN) 750 MG tablet Take 1 tablet (750 mg total) by mouth 4 (four) times daily as needed. 120 tablet 4   metoprolol tartrate (LOPRESSOR) 50 MG tablet Take 1 tablet (50 mg total) by mouth 2 (two) times daily. May take an extra 1/2 tablet ( 25 mg ) as needed for increase palpitations 185 tablet 3   nortriptyline (PAMELOR) 50 MG capsule Take 3 capsules (150 mg total) by mouth at bedtime. 270 capsule 1   pantoprazole (PROTONIX) 40 MG tablet Take 1 tablet (40 mg total) by mouth daily. 90 tablet 0   sennosides-docusate sodium (SENOKOT-S) 8.6-50 MG tablet Take 2 tablets by mouth daily. 30 tablet 1   vitamin B-12 (CYANOCOBALAMIN) 1000 MCG tablet Take 1,000 mcg by mouth daily.     No facility-administered medications prior to visit.     Per HPI unless specifically indicated in ROS section below Review of Systems  Constitutional:  Negative for fatigue and fever.  HENT:  Positive for congestion, sinus pressure, sinus pain, sneezing and sore throat.   Eyes:  Negative for pain.  Respiratory:  Negative for cough and shortness of breath.   Cardiovascular:  Negative for chest pain, palpitations  and leg swelling.  Gastrointestinal:  Negative for abdominal pain.  Genitourinary:  Negative for dysuria and vaginal bleeding.  Musculoskeletal:  Negative for back pain.  Neurological:  Negative for syncope, light-headedness and headaches.  Psychiatric/Behavioral:  Negative for dysphoric mood.    Objective:  BP 120/62   Pulse 91   Temp (!) 97.5 F (36.4 C) (Temporal)   Ht 5\' 3"  (1.6 m)   Wt 129 lb (58.5 kg)   SpO2 99%   BMI 22.85 kg/m   Wt Readings from Last 3 Encounters:  03/25/22 129 lb (58.5 kg)  02/25/22 126 lb 6 oz (57.3 kg)  12/22/21 130 lb (59 kg)      Physical Exam Constitutional:      General: She is not in acute distress.    Appearance: Normal appearance. She is well-developed. She is not ill-appearing or  toxic-appearing.  HENT:     Head: Normocephalic.     Right Ear: Hearing, tympanic membrane, ear canal and external ear normal. Tympanic membrane is not erythematous, retracted or bulging.     Left Ear: Hearing, tympanic membrane, ear canal and external ear normal. Tympanic membrane is not erythematous, retracted or bulging.     Nose: No mucosal edema or rhinorrhea.     Right Turbinates: Swollen and pale.     Left Turbinates: Swollen and pale.     Right Sinus: No maxillary sinus tenderness or frontal sinus tenderness.     Left Sinus: No maxillary sinus tenderness or frontal sinus tenderness.     Mouth/Throat:     Pharynx: Uvula midline.  Eyes:     General: Lids are normal. Lids are everted, no foreign bodies appreciated.     Conjunctiva/sclera: Conjunctivae normal.     Pupils: Pupils are equal, round, and reactive to light.  Neck:     Thyroid: No thyroid mass or thyromegaly.     Vascular: No carotid bruit.     Trachea: Trachea normal.  Cardiovascular:     Rate and Rhythm: Normal rate and regular rhythm.     Pulses: Normal pulses.     Heart sounds: Normal heart sounds, S1 normal and S2 normal. No murmur heard.    No friction rub. No gallop.  Pulmonary:     Effort: Pulmonary effort is normal. No tachypnea or respiratory distress.     Breath sounds: Normal breath sounds. No decreased breath sounds, wheezing, rhonchi or rales.  Abdominal:     General: Bowel sounds are normal.     Palpations: Abdomen is soft.     Tenderness: There is no abdominal tenderness.  Musculoskeletal:     Cervical back: Normal range of motion and neck supple.  Skin:    General: Skin is warm and dry.     Findings: No rash.  Neurological:     Mental Status: She is alert.  Psychiatric:        Mood and Affect: Mood is not anxious or depressed.        Speech: Speech normal.        Behavior: Behavior normal. Behavior is cooperative.        Thought Content: Thought content normal.        Judgment: Judgment  normal.       Results for orders placed or performed in visit on 02/18/22  Comprehensive metabolic panel  Result Value Ref Range   Sodium 138 135 - 145 mEq/L   Potassium 3.7 3.5 - 5.1 mEq/L   Chloride 100 96 - 112 mEq/L  CO2 28 19 - 32 mEq/L   Glucose, Bld 68 (L) 70 - 99 mg/dL   BUN 10 6 - 23 mg/dL   Creatinine, Ser 6.29 0.40 - 1.20 mg/dL   Total Bilirubin 0.4 0.2 - 1.2 mg/dL   Alkaline Phosphatase 96 39 - 117 U/L   AST 20 0 - 37 U/L   ALT 15 0 - 35 U/L   Total Protein 7.5 6.0 - 8.3 g/dL   Albumin 4.3 3.5 - 5.2 g/dL   GFR 52.84 (L) >13.24 mL/min   Calcium 9.5 8.4 - 10.5 mg/dL  CBC with Differential/Platelet  Result Value Ref Range   WBC 6.1 4.0 - 10.5 K/uL   RBC 4.07 3.87 - 5.11 Mil/uL   Hemoglobin 12.6 12.0 - 15.0 g/dL   HCT 40.1 02.7 - 25.3 %   MCV 94.6 78.0 - 100.0 fl   MCHC 32.9 30.0 - 36.0 g/dL   RDW 66.4 40.3 - 47.4 %   Platelets 288.0 150.0 - 400.0 K/uL   Neutrophils Relative % 61.6 43.0 - 77.0 %   Lymphocytes Relative 25.3 12.0 - 46.0 %   Monocytes Relative 8.1 3.0 - 12.0 %   Eosinophils Relative 4.2 0.0 - 5.0 %   Basophils Relative 0.8 0.0 - 3.0 %   Neutro Abs 3.8 1.4 - 7.7 K/uL   Lymphs Abs 1.6 0.7 - 4.0 K/uL   Monocytes Absolute 0.5 0.1 - 1.0 K/uL   Eosinophils Absolute 0.3 0.0 - 0.7 K/uL   Basophils Absolute 0.0 0.0 - 0.1 K/uL  Lipid panel  Result Value Ref Range   Cholesterol 188 0 - 200 mg/dL   Triglycerides 25.9 0.0 - 149.0 mg/dL   HDL 56.38 >75.64 mg/dL   VLDL 33.2 0.0 - 95.1 mg/dL   LDL Cholesterol 98 0 - 99 mg/dL   Total CHOL/HDL Ratio 3    NonHDL 114.20      COVID 19 screen:  No recent travel or known exposure to COVID19 The patient denies respiratory symptoms of COVID 19 at this time. The importance of social distancing was discussed today.   Assessment and Plan    Problem List Items Addressed This Visit     Depression, major, recurrent (HCC) - Primary (Chronic)    Chronic,  good control on low dose cymbalta without side effects,  but it has not helped with her chronic neck or low back pain.      Relevant Medications   DULoxetine (CYMBALTA) 60 MG capsule   GAD (generalized anxiety disorder) (Chronic)    Chronic, well controlled per GAD-7 but per patient she is unable to wean off the alprazolam fully.  She has been able to decrease to half a tablet alternating with whole tablet every other day.  She would like to continue weaning off but will continue slowly.  We will increase her Cymbalta to 60 mg to hopefully facilitate this.  She will continue nortriptyline to help with sleep at night.  She will call in 3 to 4 weeks for update if her symptoms or not improved as well as call for alprazolam refill if needed.  If she is not improving she would like to try Paxil which she noted works well for her in the past.      Relevant Medications   DULoxetine (CYMBALTA) 60 MG capsule   Allergic sinusitis    Acute, most likely secondary to exposure to allergen in the store given sudden onset.  She will continue using Mucinex and Zyrtec.  She will add  Flonase 2 sprays per nostril daily. There is no sign of bacterial infection and I think COVID and flu infection are unlikely.      Meds ordered this encounter  Medications   DULoxetine (CYMBALTA) 60 MG capsule    Sig: Take 1 capsule (60 mg total) by mouth daily.    Dispense:  30 capsule    Refill:  3     Tiffany Lofts, MD

## 2022-03-28 ENCOUNTER — Other Ambulatory Visit (HOSPITAL_COMMUNITY): Payer: Self-pay

## 2022-03-28 DIAGNOSIS — M25511 Pain in right shoulder: Secondary | ICD-10-CM | POA: Diagnosis not present

## 2022-03-28 DIAGNOSIS — G894 Chronic pain syndrome: Secondary | ICD-10-CM | POA: Diagnosis not present

## 2022-03-28 DIAGNOSIS — Z79899 Other long term (current) drug therapy: Secondary | ICD-10-CM | POA: Diagnosis not present

## 2022-03-28 DIAGNOSIS — M5416 Radiculopathy, lumbar region: Secondary | ICD-10-CM | POA: Diagnosis not present

## 2022-03-28 DIAGNOSIS — Z79891 Long term (current) use of opiate analgesic: Secondary | ICD-10-CM | POA: Diagnosis not present

## 2022-03-29 ENCOUNTER — Ambulatory Visit (INDEPENDENT_AMBULATORY_CARE_PROVIDER_SITE_OTHER): Payer: Medicare Other | Admitting: Family Medicine

## 2022-03-29 ENCOUNTER — Other Ambulatory Visit (HOSPITAL_COMMUNITY): Payer: Self-pay

## 2022-03-29 VITALS — BP 136/64 | HR 80 | Temp 97.0°F | Resp 14 | Ht 63.0 in | Wt 128.4 lb

## 2022-03-29 DIAGNOSIS — B9689 Other specified bacterial agents as the cause of diseases classified elsewhere: Secondary | ICD-10-CM

## 2022-03-29 DIAGNOSIS — J019 Acute sinusitis, unspecified: Secondary | ICD-10-CM

## 2022-03-29 DIAGNOSIS — R3915 Urgency of urination: Secondary | ICD-10-CM

## 2022-03-29 LAB — POC URINALSYSI DIPSTICK (AUTOMATED)
Bilirubin, UA: NEGATIVE
Glucose, UA: NEGATIVE
Ketones, UA: NEGATIVE
Nitrite, UA: POSITIVE
Protein, UA: POSITIVE — AB
Spec Grav, UA: 1.01 (ref 1.010–1.025)
Urobilinogen, UA: 0.2 E.U./dL
pH, UA: 6 (ref 5.0–8.0)

## 2022-03-29 MED ORDER — AMOXICILLIN-POT CLAVULANATE 875-125 MG PO TABS
1.0000 | ORAL_TABLET | Freq: Two times a day (BID) | ORAL | 0 refills | Status: DC
  Filled 2022-03-29: qty 20, 10d supply, fill #0

## 2022-03-29 NOTE — Progress Notes (Signed)
Patient ID: Tiffany [name], female    DOB: Oct 02, 1945, 76 y.o.   MRN: [id number]  This visit was conducted in person.  BP 136/64   Pulse 80   Temp (!) 97 F (36.1 C) (Temporal)   Resp 14   Ht 5\' 3"  (1.6 m)   Wt 128 lb 6 oz (58.2 kg)   SpO2 99%   BMI 22.74 kg/m    CC:  Chief Complaint  Patient presents with   Urinary Frequency    Started 03/25/22 Urgency, bladder pressure.   Hoarse    Sx started on 03/25/22- post nasal drip, hoarse, congestion-coughing up green phlegm. Has been using nasal spray.    Subjective:   HPI: Tiffany [name] is a 76 y.o. female presenting on 03/29/2022 for Urinary Frequency (Started 03/25/22 Urgency, bladder pressure.) and Hoarse (Sx started on 03/25/22- post nasal drip, hoarse, congestion-coughing up green phlegm. Has been using nasal spray.)   She has noted 4 days of increased urinary frequency and urgency, bladder pressure.  Hard to get urine out.. there is urine odor. No dysuria, no fever, no flank pain No antibiotics in last  month.  UA reviewed. Good water intake.   Seen on 10/13 with allergic sinusitis ( told to take flonase) but has now progressed to productive cough with change of mucus to green phlegm.  Some ST and hoarse voice.    No SOB, no wheeze.  No chest pain.   Relevant past medical, surgical, family and social history reviewed and updated as indicated. Interim medical history since our last visit reviewed. Allergies and medications reviewed and updated. Outpatient Medications Prior to Visit  Medication Sig Dispense Refill   ALPRAZolam (XANAX) 0.5 MG tablet Take 1 tablet (0.5 mg total) by mouth daily as needed for anxiety. 30 tablet 0   atorvastatin (LIPITOR) 10 MG tablet Take 1 tablet (10 mg total) by mouth daily. 90 tablet 3   Cholecalciferol (VITAMIN D3 PO) Take 1 tablet by mouth 3 (three) times a week.     DULoxetine (CYMBALTA) 60 MG capsule Take 1 capsule (60 mg total) by mouth daily. 30 capsule 3    HYDROcodone-acetaminophen (NORCO/VICODIN) 5-325 MG tablet Take 1 tablet by mouth 4 (four) times daily as needed.     methocarbamol (ROBAXIN) 750 MG tablet Take 1 tablet (750 mg total) by mouth 4 (four) times daily as needed. 120 tablet 4   metoprolol tartrate (LOPRESSOR) 50 MG tablet Take 1 tablet (50 mg total) by mouth 2 (two) times daily. May take an extra 1/2 tablet ( 25 mg ) as needed for increase palpitations 185 tablet 3   nortriptyline (PAMELOR) 50 MG capsule Take 3 capsules (150 mg total) by mouth at bedtime. 270 capsule 1   pantoprazole (PROTONIX) 40 MG tablet Take 1 tablet (40 mg total) by mouth daily. 90 tablet 0   sennosides-docusate sodium (SENOKOT-S) 8.6-50 MG tablet Take 2 tablets by mouth daily. 30 tablet 1   vitamin B-12 (CYANOCOBALAMIN) 1000 MCG tablet Take 1,000 mcg by mouth daily.     No facility-administered medications prior to visit.     Per HPI unless specifically indicated in ROS section below Review of Systems  Constitutional:  Negative for fatigue and fever.  HENT:  Negative for congestion.   Eyes:  Negative for pain.  Respiratory:  Negative for cough and shortness of breath.   Cardiovascular:  Negative for chest pain, palpitations and leg swelling.  Gastrointestinal:  Negative for abdominal pain.  Genitourinary:  Positive for difficulty urinating and urgency. Negative for dysuria, flank pain, hematuria and vaginal bleeding.  Musculoskeletal:  Negative for back pain.  Neurological:  Negative for syncope, light-headedness and headaches.  Psychiatric/Behavioral:  Negative for dysphoric mood.    Objective:  BP 136/64   Pulse 80   Temp (!) 97 F (36.1 C) (Temporal)   Resp 14   Ht 5\' 3"  (1.6 m)   Wt 128 lb 6 oz (58.2 kg)   SpO2 99%   BMI 22.74 kg/m   Wt Readings from Last 3 Encounters:  03/29/22 128 lb 6 oz (58.2 kg)  03/25/22 129 lb (58.5 kg)  02/25/22 126 lb 6 oz (57.3 kg)      Physical Exam Constitutional:      General: She is not in acute  distress.    Appearance: Normal appearance. She is well-developed. She is not ill-appearing or toxic-appearing.  HENT:     Head: Normocephalic.     Right Ear: Hearing, tympanic membrane, ear canal and external ear normal. Tympanic membrane is not erythematous, retracted or bulging.     Left Ear: Hearing, tympanic membrane, ear canal and external ear normal. Tympanic membrane is not erythematous, retracted or bulging.     Nose: No mucosal edema or rhinorrhea.     Right Sinus: No maxillary sinus tenderness or frontal sinus tenderness.     Left Sinus: No maxillary sinus tenderness or frontal sinus tenderness.     Mouth/Throat:     Pharynx: Uvula midline.  Eyes:     General: Lids are normal. Lids are everted, no foreign bodies appreciated.     Conjunctiva/sclera: Conjunctivae normal.     Pupils: Pupils are equal, round, and reactive to light.  Neck:     Thyroid: No thyroid mass or thyromegaly.     Vascular: No carotid bruit.     Trachea: Trachea normal.  Cardiovascular:     Rate and Rhythm: Normal rate and regular rhythm.     Pulses: Normal pulses.     Heart sounds: Normal heart sounds, S1 normal and S2 normal. No murmur heard.    No friction rub. No gallop.  Pulmonary:     Effort: Pulmonary effort is normal. No tachypnea or respiratory distress.     Breath sounds: Normal breath sounds. No decreased breath sounds, wheezing, rhonchi or rales.  Abdominal:     General: Bowel sounds are normal.     Palpations: Abdomen is soft.     Tenderness: There is no abdominal tenderness. There is no right CVA tenderness or left CVA tenderness.  Musculoskeletal:     Cervical back: Normal range of motion and neck supple.  Skin:    General: Skin is warm and dry.     Findings: No rash.  Neurological:     Mental Status: She is alert.  Psychiatric:        Mood and Affect: Mood is not anxious or depressed.        Speech: Speech normal.        Behavior: Behavior normal. Behavior is cooperative.         Thought Content: Thought content normal.        Judgment: Judgment normal.      Results for orders placed or performed in visit on 03/29/22  POCT Urinalysis Dipstick (Automated)  Result Value Ref Range   Color, UA Yellow    Clarity, UA Cloudy    Glucose, UA Negative Negative   Bilirubin, UA Negative    Ketones, UA  Negative    Spec Grav, UA 1.010 1.010 - 1.025   Blood, UA Moderate (2+)    pH, UA 6.0 5.0 - 8.0   Protein, UA Positive (A) Negative   Urobilinogen, UA 0.2 0.2 or 1.0 E.U./dL   Nitrite, UA Positive    Leukocytes, UA Large (3+) (A) Negative     COVID 19 screen:  No recent travel or known exposure to COVID19 The patient denies respiratory symptoms of COVID 19 at this time. The importance of social distancing was discussed today.   Assessment and Plan    Problem List Items Addressed This Visit     Acute bacterial sinusitis    Acute, likely initial allergic versus viral sinusitis now progressed to bacterial infection.  Continue mucolytic, nasal saline and treat with a course of broaden antibiotics: Augmentin 1 tablet daily x10 days. Return and ER precautions provided      Relevant Medications   amoxicillin-clavulanate (AUGMENTIN) 875-125 MG tablet   Urinary urgency - Primary    Acute, possible bladder irritation versus infection.  Urinalysis unremarkable, but will send culture to verify no infection.      Relevant Orders   POCT Urinalysis Dipstick (Automated) (Completed)   Urine Culture (Completed)     Kerby Nora, MD

## 2022-04-01 LAB — URINE CULTURE
MICRO NUMBER:: 14061846
SPECIMEN QUALITY:: ADEQUATE

## 2022-04-05 ENCOUNTER — Other Ambulatory Visit: Payer: Medicare Other

## 2022-04-06 ENCOUNTER — Ambulatory Visit
Admission: RE | Admit: 2022-04-06 | Discharge: 2022-04-06 | Disposition: A | Discharge status: Home / Self Care | Payer: Medicare Other | Source: Ambulatory Visit | Attending: Family Medicine | Admitting: Family Medicine

## 2022-04-06 DIAGNOSIS — Z780 Asymptomatic menopausal state: Secondary | ICD-10-CM | POA: Diagnosis not present

## 2022-04-06 DIAGNOSIS — M810 Age-related osteoporosis without current pathological fracture: Secondary | ICD-10-CM | POA: Diagnosis not present

## 2022-04-13 ENCOUNTER — Other Ambulatory Visit (HOSPITAL_COMMUNITY): Payer: Self-pay

## 2022-04-13 ENCOUNTER — Encounter: Payer: Self-pay | Admitting: Family Medicine

## 2022-04-13 ENCOUNTER — Ambulatory Visit (INDEPENDENT_AMBULATORY_CARE_PROVIDER_SITE_OTHER): Payer: Medicare Other | Admitting: Family Medicine

## 2022-04-13 VITALS — BP 110/64 | HR 72 | Temp 97.9°F | Ht 63.0 in | Wt 130.1 lb

## 2022-04-13 DIAGNOSIS — M810 Age-related osteoporosis without current pathological fracture: Secondary | ICD-10-CM | POA: Diagnosis not present

## 2022-04-13 LAB — VITAMIN D 25 HYDROXY (VIT D DEFICIENCY, FRACTURES): VITD: 37.19 ng/mL (ref 30.00–100.00)

## 2022-04-13 MED ORDER — HYDROXYZINE HCL 10 MG PO TABS
10.0000 mg | ORAL_TABLET | Freq: Every day | ORAL | 0 refills | Status: DC | PRN
  Filled 2022-04-13: qty 30, 30d supply, fill #0

## 2022-04-13 NOTE — Patient Instructions (Addendum)
Please stop at the lab to have labs drawn.  We will look into starting Prolia.  Can use atarax instead of alprazolam as needed. Continue weaning alprazolam as able.

## 2022-04-13 NOTE — Progress Notes (Signed)
Patient ID: Tiffany [name], female    DOB: September 23, 1945, 76 y.o.   MRN: [id number]  This visit was conducted in person.  BP 110/64 (BP Location: Left Arm, Patient Position: Sitting)   Pulse 72   Temp 97.9 F (36.6 C) (Skin)   Ht 5\' 3"  (1.6 m)   Wt 130 lb 2 oz (59 kg)   BMI 23.05 kg/m    CC:  Chief Complaint  Patient presents with   Follow-up    Discuss bone density results    Subjective:   HPI: Tiffany [name] is a 76 y.o. female presenting on 04/13/2022 for Follow-up (Discuss bone density results)   Started on fosamax 06/2009, stopped after 4 years in 2015  2018 T -3.0 Worsening T score on bone density of osteoporosis 2023 T score T -3.4  She does daily exercise. She is a non smoker.    Ca 9.5  GFR 54 No recent vit D   Relevant past medical, surgical, family and social history reviewed and updated as indicated. Interim medical history since our last visit reviewed. Allergies and medications reviewed and updated. Outpatient Medications Prior to Visit  Medication Sig Dispense Refill   ALPRAZolam (XANAX) 0.5 MG tablet Take 1 tablet (0.5 mg total) by mouth daily as needed for anxiety. 30 tablet 0   atorvastatin (LIPITOR) 10 MG tablet Take 1 tablet (10 mg total) by mouth daily. 90 tablet 3   Cholecalciferol (VITAMIN D3 PO) Take 1 tablet by mouth 3 (three) times a week.     DULoxetine (CYMBALTA) 60 MG capsule Take 1 capsule (60 mg total) by mouth daily. 30 capsule 3   HYDROcodone-acetaminophen (NORCO/VICODIN) 5-325 MG tablet Take 1 tablet by mouth 4 (four) times daily as needed.     methocarbamol (ROBAXIN) 750 MG tablet Take 1 tablet (750 mg total) by mouth 4 (four) times daily as needed. 120 tablet 4   metoprolol tartrate (LOPRESSOR) 50 MG tablet Take 1 tablet (50 mg total) by mouth 2 (two) times daily. May take an extra 1/2 tablet ( 25 mg ) as needed for increase palpitations 185 tablet 3   nortriptyline (PAMELOR) 50 MG capsule Take 3 capsules (150 mg total) by  mouth at bedtime. 270 capsule 1   pantoprazole (PROTONIX) 40 MG tablet Take 1 tablet (40 mg total) by mouth daily. 90 tablet 0   sennosides-docusate sodium (SENOKOT-S) 8.6-50 MG tablet Take 2 tablets by mouth daily. 30 tablet 1   vitamin B-12 (CYANOCOBALAMIN) 1000 MCG tablet Take 1,000 mcg by mouth daily.     amoxicillin-clavulanate (AUGMENTIN) 875-125 MG tablet Take 1 tablet by mouth 2 (two) times daily. 20 tablet 0   No facility-administered medications prior to visit.     Per HPI unless specifically indicated in ROS section below Review of Systems  Constitutional:  Negative for fatigue and fever.  HENT:  Negative for congestion.   Eyes:  Negative for pain.  Respiratory:  Negative for cough and shortness of breath.   Cardiovascular:  Negative for chest pain, palpitations and leg swelling.  Gastrointestinal:  Negative for abdominal pain.  Genitourinary:  Negative for dysuria and vaginal bleeding.  Musculoskeletal:  Negative for back pain.  Neurological:  Negative for syncope, light-headedness and headaches.  Psychiatric/Behavioral:  Negative for dysphoric mood.    Objective:  BP 110/64 (BP Location: Left Arm, Patient Position: Sitting)   Pulse 72   Temp 97.9 F (36.6 C) (Skin)   Ht 5\' 3"  (1.6 m)   Wt 130  lb 2 oz (59 kg)   BMI 23.05 kg/m   Wt Readings from Last 3 Encounters:  04/13/22 130 lb 2 oz (59 kg)  03/29/22 128 lb 6 oz (58.2 kg)  03/25/22 129 lb (58.5 kg)      Physical Exam Constitutional:      General: She is not in acute distress.    Appearance: Normal appearance. She is well-developed. She is not ill-appearing or toxic-appearing.  HENT:     Head: Normocephalic.     Right Ear: Hearing, tympanic membrane, ear canal and external ear normal. Tympanic membrane is not erythematous, retracted or bulging.     Left Ear: Hearing, tympanic membrane, ear canal and external ear normal. Tympanic membrane is not erythematous, retracted or bulging.     Nose: No mucosal edema or  rhinorrhea.     Right Sinus: No maxillary sinus tenderness or frontal sinus tenderness.     Left Sinus: No maxillary sinus tenderness or frontal sinus tenderness.     Mouth/Throat:     Pharynx: Uvula midline.  Eyes:     General: Lids are normal. Lids are everted, no foreign bodies appreciated.     Conjunctiva/sclera: Conjunctivae normal.     Pupils: Pupils are equal, round, and reactive to light.  Neck:     Thyroid: No thyroid mass or thyromegaly.     Vascular: No carotid bruit.     Trachea: Trachea normal.  Cardiovascular:     Rate and Rhythm: Normal rate and regular rhythm.     Pulses: Normal pulses.     Heart sounds: Normal heart sounds, S1 normal and S2 normal. No murmur heard.    No friction rub. No gallop.  Pulmonary:     Effort: Pulmonary effort is normal. No tachypnea or respiratory distress.     Breath sounds: Normal breath sounds. No decreased breath sounds, wheezing, rhonchi or rales.  Abdominal:     General: Bowel sounds are normal.     Palpations: Abdomen is soft.     Tenderness: There is no abdominal tenderness.  Musculoskeletal:     Cervical back: Normal range of motion and neck supple.  Skin:    General: Skin is warm and dry.     Findings: No rash.  Neurological:     Mental Status: She is alert.  Psychiatric:        Mood and Affect: Mood is not anxious or depressed.        Speech: Speech normal.        Behavior: Behavior normal. Behavior is cooperative.        Thought Content: Thought content normal.        Judgment: Judgment normal.       Results for orders placed or performed in visit on 03/29/22  Urine Culture   Specimen: Urine  Result Value Ref Range   MICRO NUMBER: [id number]    SPECIMEN QUALITY: Adequate    Sample Source URINE    STATUS: FINAL    ISOLATE 1: Escherichia coli (A)       Susceptibility   Escherichia coli - URINE CULTURE, REFLEX    AMOX/CLAVULANIC 4 Sensitive     AMPICILLIN >=32 Resistant     AMPICILLIN/SULBACTAM 16 Intermediate      CEFAZOLIN* <=4 Not Reportable      * For infections other than uncomplicated UTI caused by E. coli, K. pneumoniae or P. mirabilis: Cefazolin is resistant if MIC > or = 8 mcg/mL. (Distinguishing susceptible versus intermediate for isolates with MIC <  or = 4 mcg/mL requires additional testing.) For uncomplicated UTI caused by E. coli, K. pneumoniae or P. mirabilis: Cefazolin is susceptible if MIC <32 mcg/mL and predicts susceptible to the oral agents cefaclor, cefdinir, cefpodoxime, cefprozil, cefuroxime, cephalexin and loracarbef.     CEFTAZIDIME <=1 Sensitive     CEFEPIME <=1 Sensitive     CEFTRIAXONE <=1 Sensitive     CIPROFLOXACIN 0.5 Intermediate     LEVOFLOXACIN 1 Intermediate     GENTAMICIN <=1 Sensitive     IMIPENEM <=0.25 Sensitive     NITROFURANTOIN <=16 Sensitive     PIP/TAZO <=4 Sensitive     TOBRAMYCIN <=1 Sensitive     TRIMETH/SULFA* <=20 Sensitive      * For infections other than uncomplicated UTI caused by E. coli, K. pneumoniae or P. mirabilis: Cefazolin is resistant if MIC > or = 8 mcg/mL. (Distinguishing susceptible versus intermediate for isolates with MIC < or = 4 mcg/mL requires additional testing.) For uncomplicated UTI caused by E. coli, K. pneumoniae or P. mirabilis: Cefazolin is susceptible if MIC <32 mcg/mL and predicts susceptible to the oral agents cefaclor, cefdinir, cefpodoxime, cefprozil, cefuroxime, cephalexin and loracarbef. Legend: S = Susceptible  I = Intermediate R = Resistant  NS = Not susceptible * = Not tested  NR = Not reported **NN = See antimicrobic comments   POCT Urinalysis Dipstick (Automated)  Result Value Ref Range   Color, UA Yellow    Clarity, UA Cloudy    Glucose, UA Negative Negative   Bilirubin, UA Negative    Ketones, UA Negative    Spec Grav, UA 1.010 1.010 - 1.025   Blood, UA Moderate (2+)    pH, UA 6.0 5.0 - 8.0   Protein, UA Positive (A) Negative   Urobilinogen, UA 0.2 0.2 or 1.0 E.U./dL   Nitrite, UA  Positive    Leukocytes, UA Large (3+) (A) Negative     COVID 19 screen:  No recent travel or known exposure to COVID19 The patient denies respiratory symptoms of COVID 19 at this time. The importance of social distancing was discussed today.   Assessment and Plan    Problem List Items Addressed This Visit     Osteoporosis - Primary (Chronic)    Chronic, recent worsening on bone density.  Calcium and renal function normal.  We will check vitamin D.  She is a non-smoker.  She does regular weightbearing exercise. In the past she was on Fosamax with minimal improvement in her bone density.  She has been on a drug holiday for the last 2 to 3 years.  We have decided to proceed with Prolia injections every 6 month.  We discussed risk and benefits of this medication.      Relevant Orders   VITAMIN D 25 Hydroxy (Vit-D Deficiency, Fractures)     Eliezer Lofts, MD

## 2022-04-13 NOTE — Assessment & Plan Note (Signed)
Chronic, recent worsening on bone density.  Calcium and renal function normal.  We will check vitamin D.  She is a non-smoker.  She does regular weightbearing exercise. In the past she was on Fosamax with minimal improvement in her bone density.  She has been on a drug holiday for the last 2 to 3 years.  We have decided to proceed with Prolia injections every 6 month.  We discussed risk and benefits of this medication.

## 2022-04-14 ENCOUNTER — Telehealth: Payer: Self-pay

## 2022-04-14 NOTE — Telephone Encounter (Signed)
[name], MD  Tiffany Kocher V, CMA This patient would like to begin Prolia injections.  Can you please start the process?  Thanks!  Benefits submitted. Pending fax

## 2022-04-21 NOTE — Telephone Encounter (Signed)
OOP cost is $315. Patient can not afford this. Would like to know other options. Thank you

## 2022-04-21 NOTE — Telephone Encounter (Signed)
You can enter a referral to [facility] infusion they should be able to help.

## 2022-04-21 NOTE — Telephone Encounter (Signed)
She has had minimal improvement with Fosamax so nexts best option that would be possibly more affordable than Prolia could be IV Boniva. I have not set this up recently(maybe since Shirlee Limerick was here)..  Can you help set this up or do I need to talk with the referral coordinator?

## 2022-04-21 NOTE — Telephone Encounter (Signed)
I need to set up a patient for  first time Boniva injection..  When I last did this ( years ago) we had a paper protocol that was sent to [facility] outpatient.  How do we set this up now and how can we check to see if it will be covered by her insurance?

## 2022-04-21 NOTE — Telephone Encounter (Signed)
Morrie Sheldon, are you familiar with this process now. There used to be a paper that the CMAs filled out and sent over to the Outpatient Infusion [facility]. Not sure if they changed the process??  The Referral Team doesn't handle infusions - that's handled by the Clinical team or used to be

## 2022-04-25 ENCOUNTER — Other Ambulatory Visit (HOSPITAL_COMMUNITY): Payer: Self-pay

## 2022-04-25 DIAGNOSIS — G894 Chronic pain syndrome: Secondary | ICD-10-CM | POA: Diagnosis not present

## 2022-04-25 DIAGNOSIS — Z79899 Other long term (current) drug therapy: Secondary | ICD-10-CM | POA: Diagnosis not present

## 2022-04-25 DIAGNOSIS — M47816 Spondylosis without myelopathy or radiculopathy, lumbar region: Secondary | ICD-10-CM | POA: Diagnosis not present

## 2022-04-25 DIAGNOSIS — M25511 Pain in right shoulder: Secondary | ICD-10-CM | POA: Diagnosis not present

## 2022-04-29 ENCOUNTER — Other Ambulatory Visit: Payer: Self-pay | Admitting: Family Medicine

## 2022-04-29 ENCOUNTER — Telehealth: Payer: Self-pay | Admitting: Pharmacy Technician

## 2022-04-29 NOTE — Telephone Encounter (Addendum)
Auth Submission: APPROVED Payer: [employer] MEDICARE Medication & CPT/J Code(s) submitted: BONIVA Route of submission (phone, fax, portal): PORTAL Phone # Fax # Auth type: Buy/Bill Units/visits requested: X4 DOSES Reference number: [id number] Approval from: 04/29/22 to 04/30/23  at [facility]

## 2022-05-01 DIAGNOSIS — R3915 Urgency of urination: Secondary | ICD-10-CM | POA: Insufficient documentation

## 2022-05-01 NOTE — Assessment & Plan Note (Signed)
Acute, possible bladder irritation versus infection.  Urinalysis unremarkable, but will send culture to verify no infection.

## 2022-05-01 NOTE — Assessment & Plan Note (Signed)
Acute, likely initial allergic versus viral sinusitis now progressed to bacterial infection.  Continue mucolytic, nasal saline and treat with a course of broaden antibiotics: Augmentin 1 tablet daily x10 days. Return and ER precautions provided

## 2022-05-07 IMAGING — CT CT HEAD W/O CM
3 of 4 series · 15 of 47 positions shown, 18 images · non-contrast
Comparison: CT head 10/08/2019

CLINICAL DATA: Headache

EXAM:
CT HEAD WITHOUT CONTRAST
TECHNIQUE: Contiguous axial images were obtained from the base of the skull
through the vertex without intravenous contrast.

[Series 3: head 2.0 hr68 · axial · 0.42mm/px · z∈[+167,+273]mm · 9 of 67 slices shown, 12 images]
[im 7/67  brain]
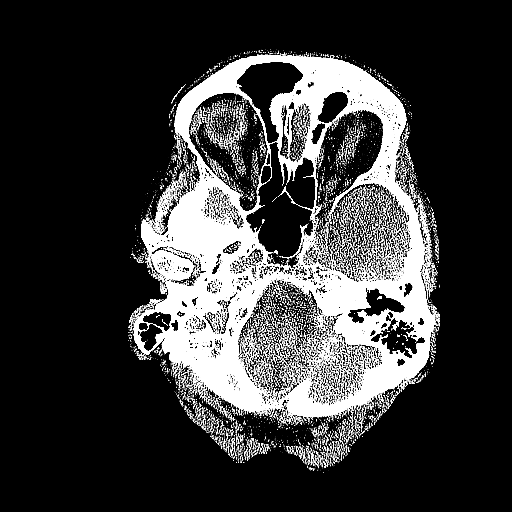
[im 7/67  bone]
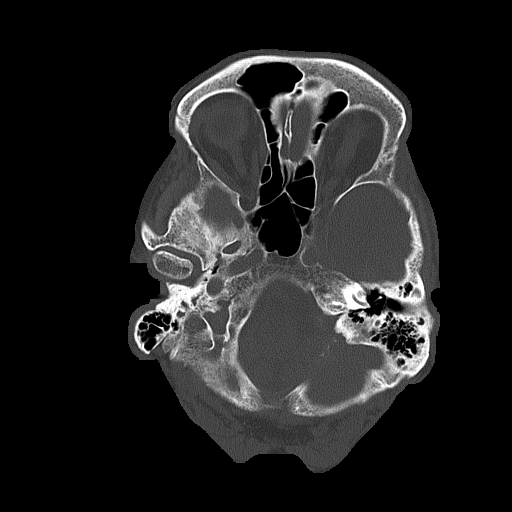
[im 14/67  brain]
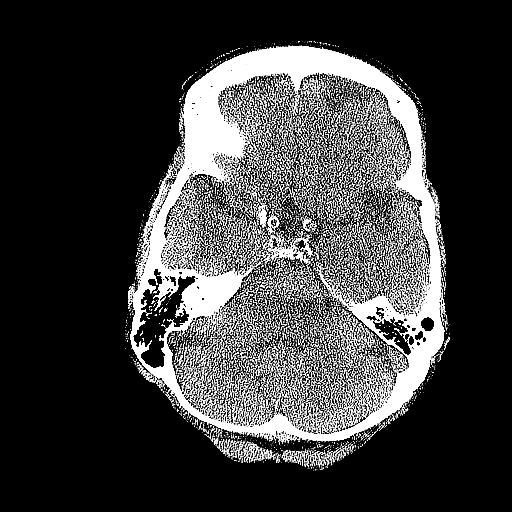
[im 20/67  brain]
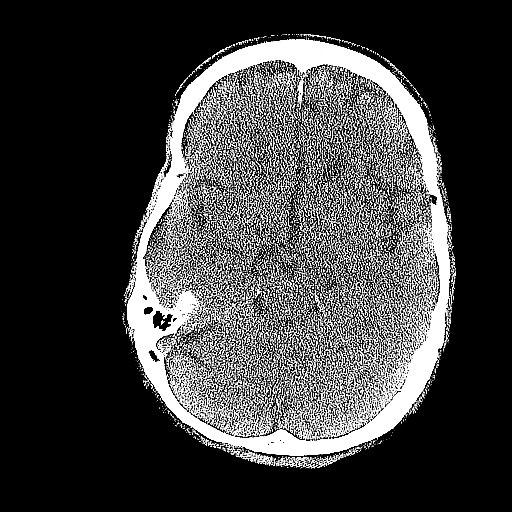
[im 27/67  brain]
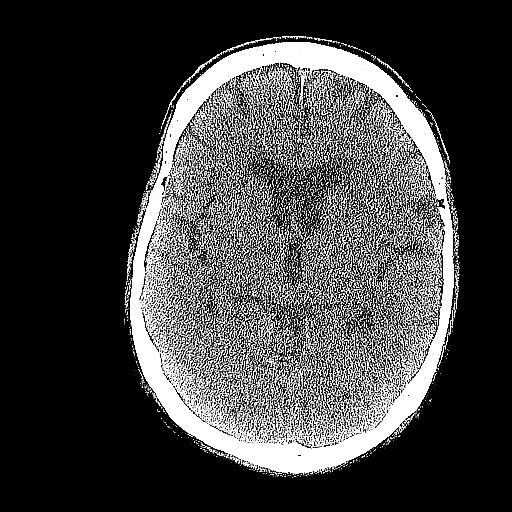
[im 34/67  brain]
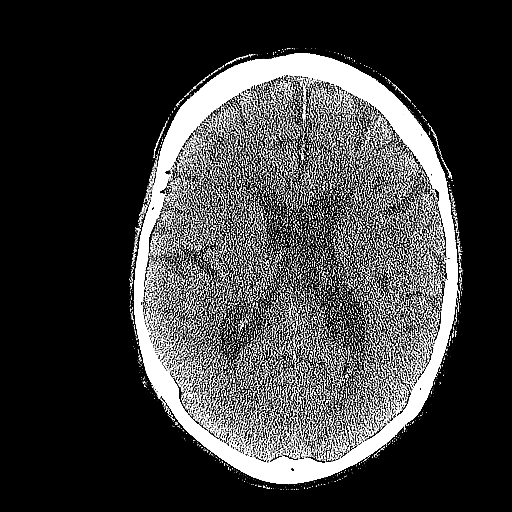
[im 34/67  bone]
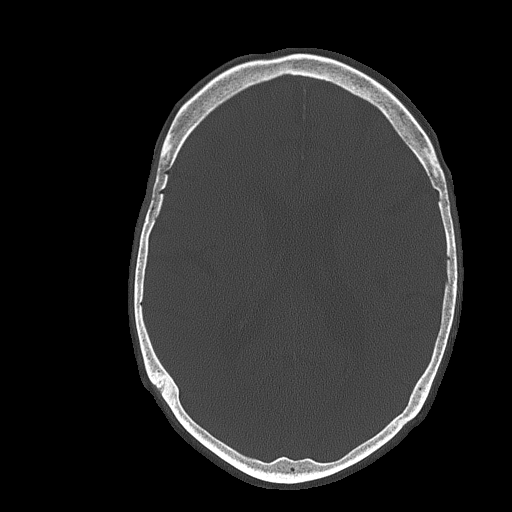
[im 40/67  brain]
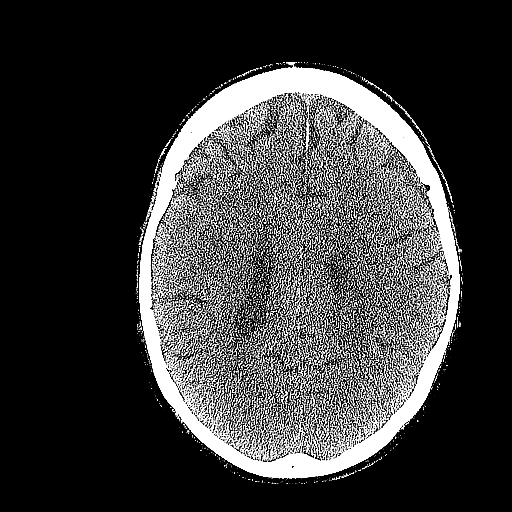
[im 47/67  brain]
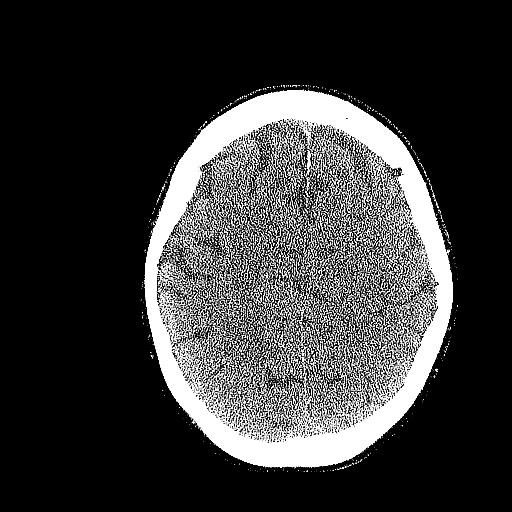
[im 53/67  brain]
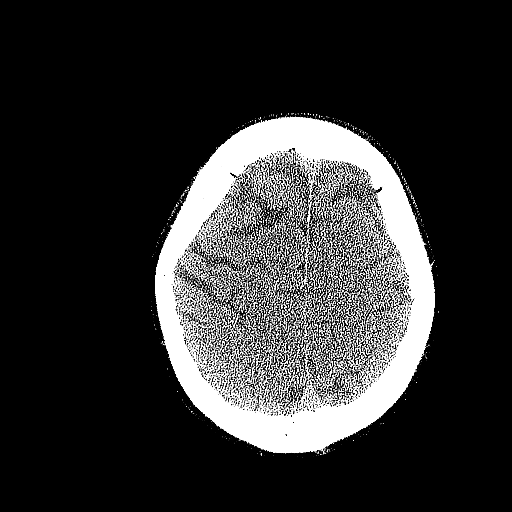
[im 60/67  brain]
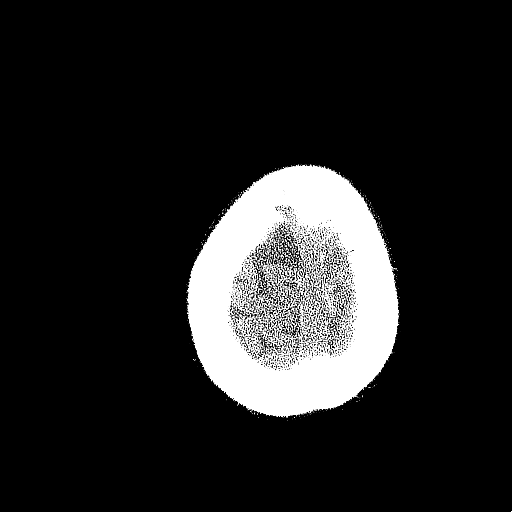
[im 60/67  bone]
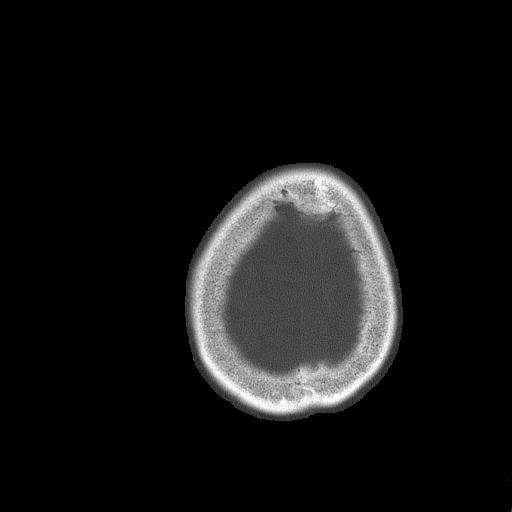

[Series 4: head 3.0 mpr cor · coronal · 0.29mm/px · 3 of 63 slices shown]
[im 21/63  brain]
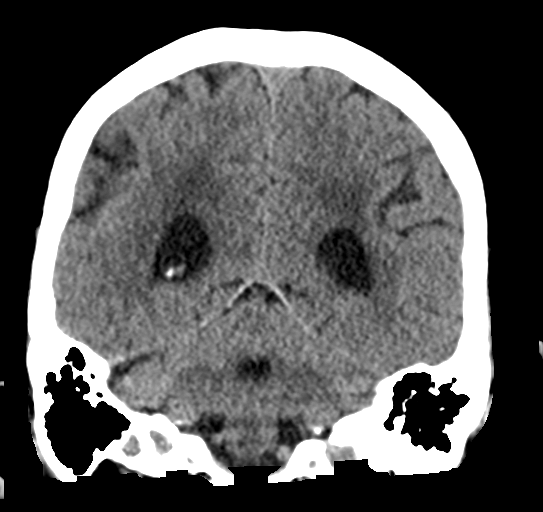
[im 28/63  brain]
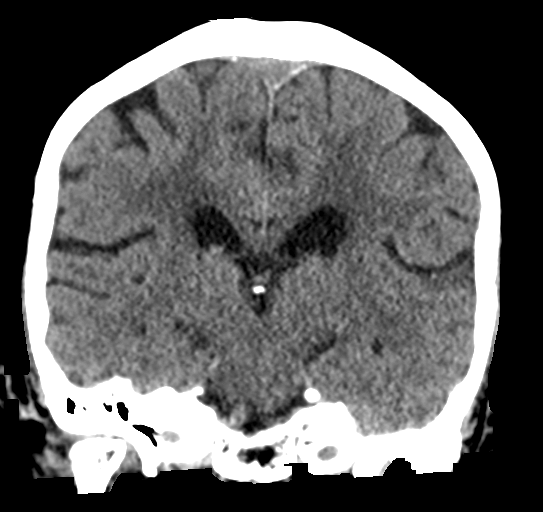
[im 35/63  brain]
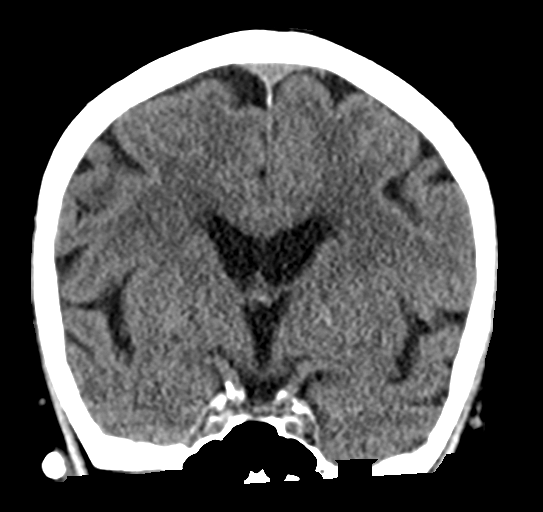

[Series 5: head 3.0 mpr sag · sagittal · 0.29mm/px · 3 of 55 slices shown]
[im 19/55  brain]
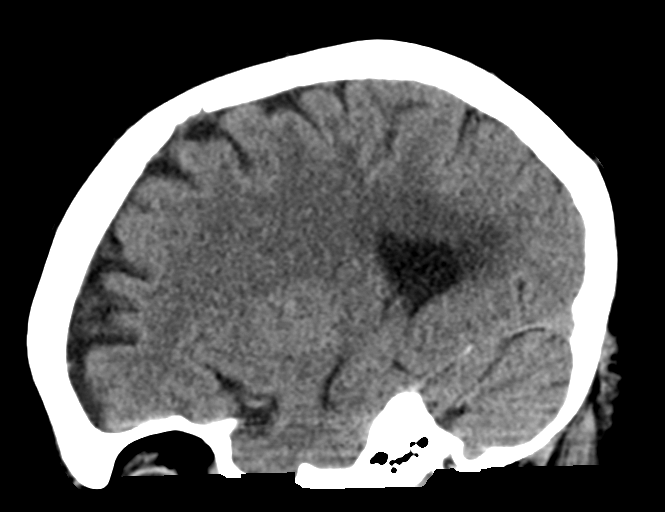
[im 28/55  brain]
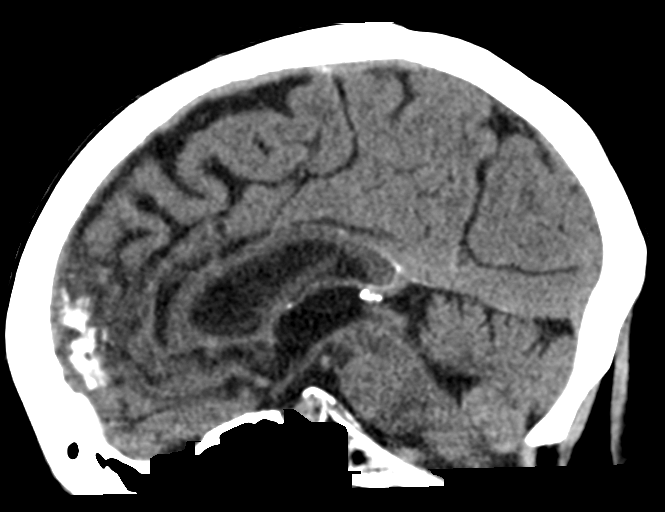
[im 37/55  brain]
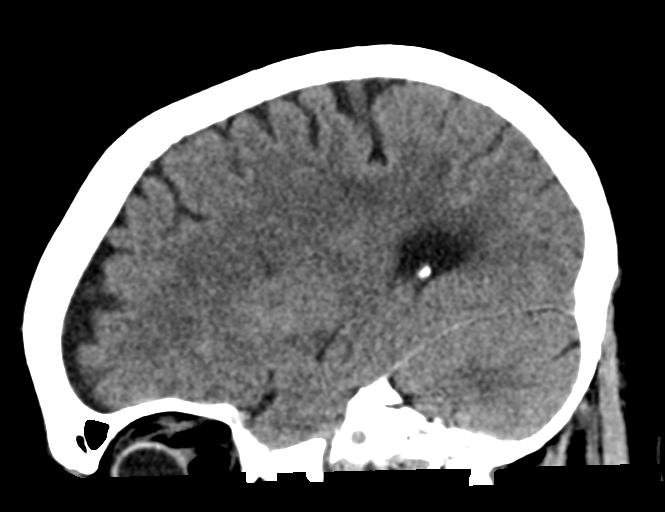

[15 of 47 positions shown; findings below may reference images not displayed]

FINDINGS: Brain: There is no acute intracranial hemorrhage, extra-axial fluid
collection, or acute infarct. The ventricles are stable in size.

Confluent hypodensity in the subcortical and periventricular white
matter likely reflects sequela of chronic white matter
microangiopathy. There is no mass lesion. There is no midline shift.

Vascular: There is calcification of the bilateral cavernous ICAs.

Skull: Normal. Negative for fracture or focal lesion.

Sinuses/Orbits: The imaged paranasal sinuses are clear. The imaged
globes and orbits are unremarkable.

Other: None.
IMPRESSION: No acute intracranial pathology.

## 2022-05-11 ENCOUNTER — Encounter: Payer: Self-pay | Admitting: Family Medicine

## 2022-05-14 DIAGNOSIS — S63501A Unspecified sprain of right wrist, initial encounter: Secondary | ICD-10-CM | POA: Diagnosis not present

## 2022-05-20 ENCOUNTER — Other Ambulatory Visit: Payer: Self-pay | Admitting: Family Medicine

## 2022-05-20 ENCOUNTER — Other Ambulatory Visit (HOSPITAL_COMMUNITY): Payer: Self-pay

## 2022-05-20 MED ORDER — HYDROXYZINE HCL 10 MG PO TABS
10.0000 mg | ORAL_TABLET | Freq: Every day | ORAL | 0 refills | Status: DC | PRN
  Filled 2022-05-20: qty 30, 30d supply, fill #0

## 2022-05-20 NOTE — Telephone Encounter (Signed)
Last office visit 04/13/22 for osteoporosis.  Last refilled 04/13/22 for #30 with no refills.  No future appointments.

## 2022-05-26 ENCOUNTER — Other Ambulatory Visit (HOSPITAL_COMMUNITY): Payer: Self-pay

## 2022-05-27 DIAGNOSIS — S52501A Unspecified fracture of the lower end of right radius, initial encounter for closed fracture: Secondary | ICD-10-CM | POA: Diagnosis not present

## 2022-06-03 DIAGNOSIS — S52501A Unspecified fracture of the lower end of right radius, initial encounter for closed fracture: Secondary | ICD-10-CM | POA: Diagnosis not present

## 2022-06-22 ENCOUNTER — Other Ambulatory Visit (HOSPITAL_COMMUNITY): Payer: Self-pay

## 2022-06-23 DIAGNOSIS — Z79899 Other long term (current) drug therapy: Secondary | ICD-10-CM | POA: Diagnosis not present

## 2022-06-23 DIAGNOSIS — M47816 Spondylosis without myelopathy or radiculopathy, lumbar region: Secondary | ICD-10-CM | POA: Diagnosis not present

## 2022-06-23 DIAGNOSIS — M25511 Pain in right shoulder: Secondary | ICD-10-CM | POA: Diagnosis not present

## 2022-06-23 DIAGNOSIS — G894 Chronic pain syndrome: Secondary | ICD-10-CM | POA: Diagnosis not present

## 2022-06-24 DIAGNOSIS — S52501D Unspecified fracture of the lower end of right radius, subsequent encounter for closed fracture with routine healing: Secondary | ICD-10-CM | POA: Diagnosis not present

## 2022-07-21 ENCOUNTER — Other Ambulatory Visit: Payer: Self-pay | Admitting: Family Medicine

## 2022-07-21 ENCOUNTER — Other Ambulatory Visit (HOSPITAL_COMMUNITY): Payer: Self-pay

## 2022-07-21 DIAGNOSIS — M47816 Spondylosis without myelopathy or radiculopathy, lumbar region: Secondary | ICD-10-CM | POA: Diagnosis not present

## 2022-07-21 DIAGNOSIS — G894 Chronic pain syndrome: Secondary | ICD-10-CM | POA: Diagnosis not present

## 2022-07-21 DIAGNOSIS — M4802 Spinal stenosis, cervical region: Secondary | ICD-10-CM | POA: Diagnosis not present

## 2022-07-21 DIAGNOSIS — M62830 Muscle spasm of back: Secondary | ICD-10-CM | POA: Diagnosis not present

## 2022-07-21 MED ORDER — PANTOPRAZOLE SODIUM 40 MG PO TBEC
40.0000 mg | DELAYED_RELEASE_TABLET | Freq: Every day | ORAL | 1 refills | Status: DC
  Filled 2022-07-21: qty 90, 90d supply, fill #0
  Filled 2022-11-16: qty 90, 90d supply, fill #1

## 2022-07-22 ENCOUNTER — Other Ambulatory Visit (HOSPITAL_COMMUNITY): Payer: Self-pay

## 2022-07-22 DIAGNOSIS — S52501D Unspecified fracture of the lower end of right radius, subsequent encounter for closed fracture with routine healing: Secondary | ICD-10-CM | POA: Diagnosis not present

## 2022-08-02 DIAGNOSIS — M25631 Stiffness of right wrist, not elsewhere classified: Secondary | ICD-10-CM | POA: Diagnosis not present

## 2022-08-02 DIAGNOSIS — M6281 Muscle weakness (generalized): Secondary | ICD-10-CM | POA: Diagnosis not present

## 2022-08-02 DIAGNOSIS — S52531D Colles' fracture of right radius, subsequent encounter for closed fracture with routine healing: Secondary | ICD-10-CM | POA: Diagnosis not present

## 2022-08-16 ENCOUNTER — Other Ambulatory Visit: Payer: Self-pay | Admitting: Family Medicine

## 2022-08-16 ENCOUNTER — Other Ambulatory Visit (HOSPITAL_COMMUNITY): Payer: Self-pay

## 2022-08-16 MED ORDER — NORTRIPTYLINE HCL 50 MG PO CAPS
150.0000 mg | ORAL_CAPSULE | Freq: Every day | ORAL | 1 refills | Status: DC
  Filled 2022-08-16: qty 270, 90d supply, fill #0
  Filled 2022-11-16: qty 270, 90d supply, fill #1

## 2022-08-16 NOTE — Telephone Encounter (Signed)
Last office visit 04/13/22 for osteoporosis.  Last refilled 03/02/2022 for #270 with 1 refill.  Next Appt: No future appointments.

## 2022-08-18 DIAGNOSIS — M62830 Muscle spasm of back: Secondary | ICD-10-CM | POA: Diagnosis not present

## 2022-08-18 DIAGNOSIS — M4802 Spinal stenosis, cervical region: Secondary | ICD-10-CM | POA: Diagnosis not present

## 2022-08-18 DIAGNOSIS — M47816 Spondylosis without myelopathy or radiculopathy, lumbar region: Secondary | ICD-10-CM | POA: Diagnosis not present

## 2022-08-18 DIAGNOSIS — G894 Chronic pain syndrome: Secondary | ICD-10-CM | POA: Diagnosis not present

## 2022-08-19 ENCOUNTER — Other Ambulatory Visit (HOSPITAL_COMMUNITY): Payer: Self-pay

## 2022-08-19 DIAGNOSIS — S52531D Colles' fracture of right radius, subsequent encounter for closed fracture with routine healing: Secondary | ICD-10-CM | POA: Diagnosis not present

## 2022-08-19 DIAGNOSIS — M6281 Muscle weakness (generalized): Secondary | ICD-10-CM | POA: Diagnosis not present

## 2022-08-19 MED ORDER — METHYLPREDNISOLONE 4 MG PO TBPK
ORAL_TABLET | ORAL | 0 refills | Status: DC
  Filled 2022-08-19: qty 21, 6d supply, fill #0

## 2022-08-30 ENCOUNTER — Other Ambulatory Visit (HOSPITAL_COMMUNITY): Payer: Self-pay

## 2022-09-16 DIAGNOSIS — S52531D Colles' fracture of right radius, subsequent encounter for closed fracture with routine healing: Secondary | ICD-10-CM | POA: Diagnosis not present

## 2022-10-03 ENCOUNTER — Telehealth: Payer: Self-pay | Admitting: Family Medicine

## 2022-10-03 NOTE — Telephone Encounter (Signed)
Pt already has appt scheduled with Dr Ermalene Searing on 10/04/22 at 11 am. Sending note to Dr Lore[address] pool and I teams Lupita Leash CMA.

## 2022-10-03 NOTE — Telephone Encounter (Signed)
[facility] - Client TELEPHONE ADVICE RECORD [facility] Patient Name: Tiffany [name] Gender: Female DOB: [birth year] Age: 77 Y 6 M 7 D Return Phone Number: [phone] (Primary) Address: City/ State/ Zip: [place]  [address] Client [facility] - Client Client Site  [facility] Day Provider Kerby Nora - MD Contact Type Call Who Is Calling Patient / Member / Family / Caregiver Call Type Triage / Clinical Relationship To Patient Self Return Phone Number [phone] (Primary) Chief Complaint Dizziness Reason for Call Symptomatic / Request for Health Information Initial Comment Caller stated that the pt needs to make an appt, is c/o dizziness that is causing falls, feels off balance. Additional Comment No 2nd number. Office stated appt has been made for tomorrow. Translation No Nurse Assessment Nurse: Evonnie Dawes, RN, Cala Bradford Date/Time (Eastern Time): 10/03/2022 2:34:15 PM Confirm and document reason for call. If symptomatic, describe symptoms. ---Caller stated that the pt needs to make an appt, is c/o dizziness that is causing falls, feels off balance. Symptoms started 2 weeks ago. Does the patient have any new or worsening symptoms? ---Yes Will a triage be completed? ---Yes Related visit to physician within the last 2 weeks? ---No Does the PT have any chronic conditions? (i.e. diabetes, asthma, this includes High risk factors for pregnancy, etc.) ---Yes List chronic conditions. ---MVP, GI problems, arthritis Is this a behavioral health or substance abuse call? ---No Guidelines Guideline Title Affirmed Question Affirmed Notes Nurse Date/Time (Eastern Time) Dizziness - Lightheadedness [1] MODERATE dizziness (e.g., interferes with normal activities) AND [2] has NOT been evaluated by doctor (or NP/PA) for this (Exception: Evonnie Dawes, RN, Cala Bradford 10/03/2022 2:35:22 PM PLEASE NOTE:  All timestamps contained within this report are represented as [place] Standard Time. CONFIDENTIALTY NOTICE: This fax transmission is intended only for the addressee. It contains information that is legally privileged, confidential or otherwise protected from use or disclosure. If you are not the intended recipient, you are strictly prohibited from reviewing, disclosing, copying using or disseminating any of this information or taking any action in reliance on or regarding this information. If you have received this fax in error, please notify us immediately by telephone so that we can arrange for its return to [place]. Phone: [phone], Toll-Free: [phone], Fax: [phone] Page: 2 of 2 Call Id: [id number] Guidelines Guideline Title Affirmed Question Affirmed Notes Nurse Date/Time Lamount Cohen Time) Dizziness caused by heat exposure, sudden standing, or poor fluid intake.) Disp. Time Lamount Cohen Time) Disposition Final User 10/03/2022 2:40:53 PM See PCP within 24 Hours Yes Evonnie Dawes, RN, Cala Bradford Final Disposition 10/03/2022 2:40:53 PM See PCP within 24 Hours Yes Daves, RN, Anders Simmonds Disagree/Comply Comply Caller Understands Yes PreDisposition Call Doctor Care Advice Given Per Guideline SEE PCP WITHIN 24 HOURS: * IF OFFICE WILL BE OPEN: You need to be examined within the next 24 hours. Call your doctor (or NP/PA) when the office opens and make an appointment. DRINK FLUIDS: * Drink several glasses of fruit juice, other clear fluids or water. * This will improve hydration and blood glucose. * If the weather is hot or you have a fever, make sure the fluids are cold. LIE DOWN AND REST: * Lie down with feet elevated for 1 hour. * This will improve circulation and increase blood flow to the brain. CALL BACK IF: * Passes out (faints) * You become worse CARE ADVICE given per Dizziness (Adult) guideline. Comments User: Jenene Slicker, RN Date/Time Lamount Cohen Time): 10/03/2022 2:37:22 PM BP was  145/70  at noon today. HR is 90. Referrals REFERRED TO PCP OFFIC

## 2022-10-03 NOTE — Telephone Encounter (Signed)
Pt called requesting a appt with [facility]. Pt stated she has been feeling very dizzy & has had many fall during the last 2 weeks due to not keeping her balance. Scheduled pt with Ermalene Searing on tomorrow, 4/23 @ 11:00am. Transferred pt to access nurse. Call back # [phone]

## 2022-10-04 ENCOUNTER — Ambulatory Visit
Admission: RE | Admit: 2022-10-04 | Discharge: 2022-10-04 | Disposition: A | Discharge status: Home / Self Care | Payer: Medicare Other | Source: Ambulatory Visit | Attending: Family Medicine | Admitting: Family Medicine

## 2022-10-04 ENCOUNTER — Ambulatory Visit (INDEPENDENT_AMBULATORY_CARE_PROVIDER_SITE_OTHER): Payer: Medicare Other | Admitting: Family Medicine

## 2022-10-04 ENCOUNTER — Encounter: Payer: Self-pay | Admitting: Family Medicine

## 2022-10-04 VITALS — BP 118/60 | HR 83 | Temp 97.6°F | Ht 63.0 in | Wt 132.5 lb

## 2022-10-04 DIAGNOSIS — S0990XA Unspecified injury of head, initial encounter: Secondary | ICD-10-CM | POA: Diagnosis not present

## 2022-10-04 DIAGNOSIS — R519 Headache, unspecified: Secondary | ICD-10-CM

## 2022-10-04 DIAGNOSIS — R296 Repeated falls: Secondary | ICD-10-CM | POA: Diagnosis not present

## 2022-10-04 DIAGNOSIS — R42 Dizziness and giddiness: Secondary | ICD-10-CM | POA: Diagnosis not present

## 2022-10-04 LAB — POC URINALSYSI DIPSTICK (AUTOMATED)
Bilirubin, UA: NEGATIVE
Blood, UA: NEGATIVE
Glucose, UA: NEGATIVE
Ketones, UA: NEGATIVE
Nitrite, UA: NEGATIVE
Protein, UA: NEGATIVE
Spec Grav, UA: 1.01 (ref 1.010–1.025)
Urobilinogen, UA: 0.2 E.U./dL
pH, UA: 8 (ref 5.0–8.0)

## 2022-10-04 NOTE — Telephone Encounter (Signed)
Noted  

## 2022-10-04 NOTE — Patient Instructions (Addendum)
We will set up head CT  to evaluate increase in headaches after head injury.  Start desensitization exercises 3 times daily   Can use Antivert/meclizine OTC if needed for severe vertigo.   Follow up if not improving as expected.

## 2022-10-04 NOTE — Progress Notes (Signed)
Patient ID: Tiffany [name], female    DOB: Feb 18, 1946, 77 y.o.   MRN: [id number]  This visit was conducted in person.  BP 118/60   Pulse 83   Temp 97.6 F (36.4 C) (Temporal)   Ht  (1.6 m)   Wt 132 lb 8 oz (60.1 kg)   SpO2 98%   BMI 23.47 kg/m    CC:  Chief Complaint  Patient presents with   Fall    4 or 5 falls in the last month.  Last on being Sunday night Fell 3 days last week.  Did hit head on concrete   Dizziness    Subjective:   HPI: Tiffany [name] is a 77 y.o. female presenting on 10/04/2022 for Fall (4 or 5 falls in the last month.  Last on being Sunday night/Fell 3 days last week.  Did hit head on concrete) and Dizziness    Frequent falls.[name] 4-5 in last month. Feels like head  spinning.. worse with movement... ongoing x 6-8 weeks.  No nausea.  When walking straight feels like she is veering left.. Balance and gait is off.  Decreased hearing bilateral ear.. no sinus issues.   Most recent last week.. had head injury on concrete.. hit back of head. No LOC.     No proceeding  palpiations, SOB.  HAs history of migraine..  nowhaving headaches 2-3 times daily.[name] last 2 hours if takes tylenol... more frequent than in past since falling. 8/10 on pain scale.  NO neurochanges.   Has history of back issues... sensation in feet good.  Seeing Dr. Vear Clock  Relevant past medical, surgical, family and social history reviewed and updated as indicated. Interim medical history since our last visit reviewed. Allergies and medications reviewed and updated. Outpatient Medications Prior to Visit  Medication Sig Dispense Refill   atorvastatin (LIPITOR) 10 MG tablet Take 1 tablet (10 mg total) by mouth daily. 90 tablet 3   Cholecalciferol (VITAMIN D3 PO) Take 1 tablet by mouth 3 (three) times a week.     HYDROcodone-acetaminophen (NORCO/VICODIN) 5-325 MG tablet Take 1 tablet by mouth 4 (four) times daily as needed.     methocarbamol (ROBAXIN) 750 MG tablet Take 1  tablet (750 mg total) by mouth 4 (four) times daily as needed. 120 tablet 4   metoprolol tartrate (LOPRESSOR) 50 MG tablet Take 1 tablet (50 mg total) by mouth 2 (two) times daily. May take an extra 1/2 tablet ( 25 mg ) as needed for increase palpitations 185 tablet 3   nortriptyline (PAMELOR) 50 MG capsule Take 3 capsules (150 mg total) by mouth at bedtime. 270 capsule 1   pantoprazole (PROTONIX) 40 MG tablet Take 1 tablet (40 mg total) by mouth daily. 90 tablet 1   PARoxetine (PAXIL) 20 MG tablet Take 20 mg by mouth daily.     sennosides-docusate sodium (SENOKOT-S) 8.6-50 MG tablet Take 2 tablets by mouth daily. 30 tablet 1   vitamin B-12 (CYANOCOBALAMIN) 1000 MCG tablet Take 1,000 mcg by mouth daily.     ALPRAZolam (XANAX) 0.5 MG tablet Take 1 tablet (0.5 mg total) by mouth daily as needed for anxiety. 30 tablet 0   amoxicillin-clavulanate (AUGMENTIN) 875-125 MG tablet Take 1 tablet by mouth 2 (two) times daily. 20 tablet 0   DULoxetine (CYMBALTA) 60 MG capsule Take 1 capsule (60 mg total) by mouth daily. 30 capsule 3   hydrOXYzine (ATARAX) 10 MG tablet Take 1 tablet (10 mg total) by mouth daily as needed  for anxiety. 30 tablet 0   methylPREDNISolone (MEDROL) 4 MG TBPK tablet Take as directed on pack over 6 days. 21 tablet 0   No facility-administered medications prior to visit.     Per HPI unless specifically indicated in ROS section below Review of Systems  Constitutional:  Negative for fatigue and fever.  HENT:  Negative for congestion.   Eyes:  Negative for pain.  Respiratory:  Negative for cough and shortness of breath.   Cardiovascular:  Negative for chest pain, palpitations and leg swelling.  Gastrointestinal:  Negative for abdominal pain and nausea.  Genitourinary:  Negative for dysuria and vaginal bleeding.  Musculoskeletal:  Positive for back pain, neck pain and neck stiffness.  Neurological:  Positive for headaches. Negative for syncope and light-headedness.   Psychiatric/Behavioral:  Negative for dysphoric mood.    Objective:  BP 118/60   Pulse 83   Temp 97.6 F (36.4 C) (Temporal)   Ht  (1.6 m)   Wt 132 lb 8 oz (60.1 kg)   SpO2 98%   BMI 23.47 kg/m   Wt Readings from Last 3 Encounters:  10/04/22 132 lb 8 oz (60.1 kg)  04/13/22 130 lb 2 oz (59 kg)  03/29/22 128 lb 6 oz (58.2 kg)      Physical Exam Constitutional:      General: She is not in acute distress.    Appearance: Normal appearance. She is well-developed. She is not ill-appearing or toxic-appearing.  HENT:     Head: Normocephalic.     Right Ear: Hearing, tympanic membrane, ear canal and external ear normal. Tympanic membrane is not erythematous, retracted or bulging.     Left Ear: Hearing, tympanic membrane, ear canal and external ear normal. Tympanic membrane is not erythematous, retracted or bulging.     Nose: No mucosal edema or rhinorrhea.     Right Sinus: No maxillary sinus tenderness or frontal sinus tenderness.     Left Sinus: No maxillary sinus tenderness or frontal sinus tenderness.     Mouth/Throat:     Pharynx: Uvula midline.  Eyes:     General: Lids are normal. Lids are everted, no foreign bodies appreciated.     Conjunctiva/sclera: Conjunctivae normal.     Pupils: Pupils are equal, round, and reactive to light.  Neck:     Thyroid: No thyroid mass or thyromegaly.     Vascular: No carotid bruit.     Trachea: Trachea normal.  Cardiovascular:     Rate and Rhythm: Normal rate and regular rhythm.     Pulses: Normal pulses.     Heart sounds: Normal heart sounds, S1 normal and S2 normal. No murmur heard.    No friction rub. No gallop.  Pulmonary:     Effort: Pulmonary effort is normal. No tachypnea or respiratory distress.     Breath sounds: Normal breath sounds. No decreased breath sounds, wheezing, rhonchi or rales.  Abdominal:     General: Bowel sounds are normal.     Palpations: Abdomen is soft.     Tenderness: There is no abdominal tenderness.   Musculoskeletal:     Cervical back: Normal range of motion and neck supple.  Skin:    General: Skin is warm and dry.     Findings: No rash.  Neurological:     Mental Status: She is alert and oriented to person, place, and time.     Cranial Nerves: Cranial nerves 2-12 are intact.     Sensory: Sensation is intact.  Motor: Motor function is intact.     Coordination: Coordination abnormal.     Comments: Triggered vertigo with Gilberto Better  Psychiatric:        Mood and Affect: Mood is not anxious or depressed.        Speech: Speech normal.        Behavior: Behavior normal. Behavior is cooperative.        Thought Content: Thought content normal.        Judgment: Judgment normal.       Results for orders placed or performed in visit on 10/04/22  POCT Urinalysis Dipstick (Automated)  Result Value Ref Range   Color, UA Yellow    Clarity, UA Clear    Glucose, UA Negative Negative   Bilirubin, UA Negative    Ketones, UA Negative    Spec Grav, UA 1.010 1.010 - 1.025   Blood, UA Negative    pH, UA 8.0 5.0 - 8.0   Protein, UA Negative Negative   Urobilinogen, UA 0.2 0.2 or 1.0 E.U./dL   Nitrite, UA Negative    Leukocytes, UA Trace (A) Negative    Assessment and Plan  Dizziness Assessment & Plan: Acute, vertigo not presyncopal episode most consistent with BPPV. Will treat with meclizine as needed and home desensitization exercises.  If not improving consider balance retraining physical therapy.  Ear exam normal, normal neuro exam  Orders: -     POCT Urinalysis Dipstick (Automated)  Frequent falls -     CT HEAD WO CONTRAST ( ); Future  Injury of head, initial encounter -     CT HEAD WO CONTRAST ( ); Future  Acute nonintractable headache, unspecified headache type Assessment & Plan: Acute worsening of chronic headaches Severe and daily Worse since falling and hitting back of head on concrete. Given age and on aspirin will evaluate with head CT noncontrast to rule  out intracranial hemorrhage post head injury.  Orders: -     CT HEAD WO CONTRAST ( ); Future    No follow-ups on file.   Kerby Nora, MD

## 2022-10-04 NOTE — Assessment & Plan Note (Signed)
Acute worsening of chronic headaches Severe and daily Worse since falling and hitting back of head on concrete. Given age and on aspirin will evaluate with head CT noncontrast to rule out intracranial hemorrhage post head injury.

## 2022-10-04 NOTE — Assessment & Plan Note (Signed)
Acute, vertigo not presyncopal episode most consistent with BPPV. Will treat with meclizine as needed and home desensitization exercises.  If not improving consider balance retraining physical therapy.  Ear exam normal, normal neuro exam

## 2022-10-13 DIAGNOSIS — G894 Chronic pain syndrome: Secondary | ICD-10-CM | POA: Diagnosis not present

## 2022-10-13 DIAGNOSIS — M62830 Muscle spasm of back: Secondary | ICD-10-CM | POA: Diagnosis not present

## 2022-10-13 DIAGNOSIS — Z79891 Long term (current) use of opiate analgesic: Secondary | ICD-10-CM | POA: Diagnosis not present

## 2022-10-13 DIAGNOSIS — M47816 Spondylosis without myelopathy or radiculopathy, lumbar region: Secondary | ICD-10-CM | POA: Diagnosis not present

## 2022-10-13 DIAGNOSIS — M4802 Spinal stenosis, cervical region: Secondary | ICD-10-CM | POA: Diagnosis not present

## 2022-11-12 DIAGNOSIS — M25511 Pain in right shoulder: Secondary | ICD-10-CM | POA: Diagnosis not present

## 2022-11-14 DIAGNOSIS — M25511 Pain in right shoulder: Secondary | ICD-10-CM | POA: Diagnosis not present

## 2022-11-16 ENCOUNTER — Other Ambulatory Visit: Payer: Self-pay

## 2022-11-16 ENCOUNTER — Other Ambulatory Visit (HOSPITAL_COMMUNITY): Payer: Self-pay

## 2022-11-16 ENCOUNTER — Other Ambulatory Visit: Payer: Self-pay | Admitting: Cardiology

## 2022-11-16 MED ORDER — ATORVASTATIN CALCIUM 10 MG PO TABS
10.0000 mg | ORAL_TABLET | Freq: Every day | ORAL | 0 refills | Status: DC
  Filled 2022-11-16: qty 30, 30d supply, fill #0

## 2022-11-16 MED ORDER — METOPROLOL TARTRATE 50 MG PO TABS
50.0000 mg | ORAL_TABLET | Freq: Two times a day (BID) | ORAL | 0 refills | Status: DC
  Filled 2022-11-16: qty 75, 30d supply, fill #0

## 2022-11-17 ENCOUNTER — Other Ambulatory Visit (HOSPITAL_COMMUNITY): Payer: Self-pay

## 2022-11-28 DIAGNOSIS — T8484XA Pain due to internal orthopedic prosthetic devices, implants and grafts, initial encounter: Secondary | ICD-10-CM | POA: Diagnosis not present

## 2022-12-07 ENCOUNTER — Other Ambulatory Visit: Payer: Self-pay | Admitting: Orthopedic Surgery

## 2022-12-07 ENCOUNTER — Other Ambulatory Visit (HOSPITAL_COMMUNITY): Payer: Self-pay

## 2022-12-07 DIAGNOSIS — T8484XD Pain due to internal orthopedic prosthetic devices, implants and grafts, subsequent encounter: Secondary | ICD-10-CM | POA: Diagnosis not present

## 2022-12-07 DIAGNOSIS — S4291XA Fracture of right shoulder girdle, part unspecified, initial encounter for closed fracture: Secondary | ICD-10-CM

## 2022-12-07 MED ORDER — METHYLPREDNISOLONE 4 MG PO TBPK
ORAL_TABLET | ORAL | 0 refills | Status: DC
  Filled 2022-12-07: qty 21, 6d supply, fill #0

## 2022-12-08 DIAGNOSIS — G894 Chronic pain syndrome: Secondary | ICD-10-CM | POA: Diagnosis not present

## 2022-12-08 DIAGNOSIS — M25511 Pain in right shoulder: Secondary | ICD-10-CM | POA: Diagnosis not present

## 2022-12-08 DIAGNOSIS — M47816 Spondylosis without myelopathy or radiculopathy, lumbar region: Secondary | ICD-10-CM | POA: Diagnosis not present

## 2022-12-08 DIAGNOSIS — M62830 Muscle spasm of back: Secondary | ICD-10-CM | POA: Diagnosis not present

## 2022-12-09 ENCOUNTER — Ambulatory Visit
Admission: RE | Admit: 2022-12-09 | Discharge: 2022-12-09 | Disposition: A | Discharge status: Home / Self Care | Payer: Medicare Other | Source: Ambulatory Visit | Attending: Orthopedic Surgery | Admitting: Orthopedic Surgery

## 2022-12-09 DIAGNOSIS — S4291XA Fracture of right shoulder girdle, part unspecified, initial encounter for closed fracture: Secondary | ICD-10-CM

## 2022-12-09 DIAGNOSIS — S42131A Displaced fracture of coracoid process, right shoulder, initial encounter for closed fracture: Secondary | ICD-10-CM | POA: Diagnosis not present
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
## 2022-12-13 ENCOUNTER — Encounter: Payer: Self-pay | Admitting: Adult Health

## 2022-12-13 ENCOUNTER — Ambulatory Visit: Payer: Medicare Other | Attending: Student | Admitting: Adult Health

## 2022-12-13 VITALS — BP 96/62 | HR 61 | Ht 63.5 in | Wt 131.2 lb

## 2022-12-13 DIAGNOSIS — I10 Essential (primary) hypertension: Secondary | ICD-10-CM | POA: Diagnosis not present

## 2022-12-13 DIAGNOSIS — I059 Rheumatic mitral valve disease, unspecified: Secondary | ICD-10-CM

## 2022-12-13 DIAGNOSIS — I493 Ventricular premature depolarization: Secondary | ICD-10-CM

## 2022-12-13 NOTE — Patient Instructions (Signed)
Medication Instructions:  Stop Metorpolol *If you need a refill on your cardiac medications before your next appointment, please call your pharmacy*   Lab Work: No Labs If you have labs (blood work) drawn today and your tests are completely normal, you will receive your results only by: MyChart Message (if you have MyChart) OR A paper copy in the mail If you have any lab test that is abnormal or we need to change your treatment, we will call you to review the results.   Testing/Procedures: No Testing   Follow-Up: At [facility], you and your health needs are our priority.  As part of our continuing mission to provide you with exceptional heart care, we have created designated Provider Care Teams.  These Care Teams include your primary Cardiologist (physician) and Advanced Practice Providers (APPs -  Physician Assistants and Nurse Practitioners) who all work together to provide you with the care you need, when you need it.  We recommend signing up for the patient portal called "MyChart".  Sign up information is provided on this After Visit Summary.  MyChart is used to connect with patients for Virtual Visits (Telemedicine).  Patients are able to view lab/test results, encounter notes, upcoming appointments, etc.  Non-urgent messages can be sent to your provider as well.   To learn more about what you can do with MyChart, go to [email or web address].    Your next appointment:   1 month(s)  Provider:   Joni Reining, DNP, ANP      Other Instructions Daily Blood Pressure In The Morning.

## 2022-12-14 DIAGNOSIS — M25511 Pain in right shoulder: Secondary | ICD-10-CM | POA: Diagnosis not present

## 2022-12-21 DIAGNOSIS — T8484XD Pain due to internal orthopedic prosthetic devices, implants and grafts, subsequent encounter: Secondary | ICD-10-CM | POA: Diagnosis not present
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
## 2023-01-13 ENCOUNTER — Ambulatory Visit: Payer: Medicare Other | Attending: Adult Health | Admitting: Adult Health

## 2023-01-13 ENCOUNTER — Encounter: Payer: Self-pay | Admitting: Adult Health

## 2023-01-13 VITALS — BP 124/78 | HR 68 | Ht 63.5 in | Wt 133.2 lb

## 2023-01-13 DIAGNOSIS — S92335A Nondisplaced fracture of third metatarsal bone, left foot, initial encounter for closed fracture: Secondary | ICD-10-CM | POA: Diagnosis not present

## 2023-01-13 DIAGNOSIS — S92325A Nondisplaced fracture of second metatarsal bone, left foot, initial encounter for closed fracture: Secondary | ICD-10-CM | POA: Diagnosis not present

## 2023-01-13 DIAGNOSIS — R001 Bradycardia, unspecified: Secondary | ICD-10-CM | POA: Diagnosis not present

## 2023-01-13 DIAGNOSIS — S92345A Nondisplaced fracture of fourth metatarsal bone, left foot, initial encounter for closed fracture: Secondary | ICD-10-CM | POA: Diagnosis not present

## 2023-01-13 DIAGNOSIS — E7800 Pure hypercholesterolemia, unspecified: Secondary | ICD-10-CM | POA: Diagnosis not present

## 2023-01-13 DIAGNOSIS — I10 Essential (primary) hypertension: Secondary | ICD-10-CM | POA: Diagnosis not present

## 2023-01-13 NOTE — Patient Instructions (Signed)
Medication Instructions:  No Changes *If you need a refill on your cardiac medications before your next appointment, please call your pharmacy*   Lab Work: No Labs If you have labs (blood work) drawn today and your tests are completely normal, you will receive your results only by: [place] (if you have MyChart) OR A paper copy in the mail If you have any lab test that is abnormal or we need to change your treatment, we will call you to review the results.   Testing/Procedures: No Testing   Follow-Up: At [facility], you and your health needs are our priority.  As part of our continuing mission to provide you with exceptional heart care, we have created designated Provider Care Teams.  These Care Teams include your primary Cardiologist (physician) and Advanced Practice Providers (APPs -  Physician Assistants and Nurse Practitioners) who all work together to provide you with the care you need, when you need it.  We recommend signing up for the patient portal called "MyChart".  Sign up information is provided on this After Visit Summary.  MyChart is used to connect with patients for Virtual Visits (Telemedicine).  Patients are able to view lab/test results, encounter notes, upcoming appointments, etc.  Non-urgent messages can be sent to your provider as well.   To learn more about what you can do with MyChart, go to [email or web address].    Your next appointment:   1 year(s)  Provider:   Glenetta Hew, MD

## 2023-01-16 ENCOUNTER — Telehealth: Payer: Self-pay | Admitting: Family Medicine

## 2023-01-16 NOTE — Telephone Encounter (Signed)
Spoke with Tiffany [name].  She was seen at [facility] and [place] on Friday and was dx with broken toes.  She was put in a boot and was told she couldn't walk or drive.  She states she lives by herself and doesn't really have anyone around to help her.  I advised her to reach back out to [employer] and ask if they could possible put her in a post op shoe instead of a Cam Walker so she can be more mobile since she has to take care of herself.   I told her I wasn't sure why they told to contact her PCP.  Ms. Ando states they said we could get her some help.  She had Medicare so a HH aide would not be covered .  Patient states she can take care of herself,  she just can't move in that cam walker.  She will reach back out to [place].   FYI to Dr. Ermalene Searing.

## 2023-01-16 NOTE — Telephone Encounter (Signed)
Patient has a boot on her foot due to broken toes.Orthopedic doctor doesn't want her to walk or drive. She said that she do not have anyone to depend on to help her,and she called them to let them know that and they told her to call Dr Ermalene Searing. Please advise.

## 2023-01-17 NOTE — Telephone Encounter (Signed)
Noted.  I can make a referral to a Child psychotherapist given she has [facility] if she wants... maybe they can set her up with transportation, or meals on wheels etc. Let me know if she is interested.

## 2023-01-17 NOTE — Telephone Encounter (Signed)
Tiffany [name] notified as instructed by telephone.  Patient states she doesn't need the referral for social worker.

## 2023-01-20 DIAGNOSIS — S92335D Nondisplaced fracture of third metatarsal bone, left foot, subsequent encounter for fracture with routine healing: Secondary | ICD-10-CM | POA: Diagnosis not present

## 2023-01-20 DIAGNOSIS — S92325D Nondisplaced fracture of second metatarsal bone, left foot, subsequent encounter for fracture with routine healing: Secondary | ICD-10-CM | POA: Diagnosis not present

## 2023-01-20 DIAGNOSIS — S92345D Nondisplaced fracture of fourth metatarsal bone, left foot, subsequent encounter for fracture with routine healing: Secondary | ICD-10-CM | POA: Diagnosis not present

## 2023-02-02 DIAGNOSIS — M47816 Spondylosis without myelopathy or radiculopathy, lumbar region: Secondary | ICD-10-CM | POA: Diagnosis not present

## 2023-02-02 DIAGNOSIS — M79672 Pain in left foot: Secondary | ICD-10-CM | POA: Diagnosis not present

## 2023-02-02 DIAGNOSIS — G894 Chronic pain syndrome: Secondary | ICD-10-CM | POA: Diagnosis not present

## 2023-02-02 DIAGNOSIS — M25511 Pain in right shoulder: Secondary | ICD-10-CM | POA: Diagnosis not present

## 2023-02-07 ENCOUNTER — Other Ambulatory Visit (HOSPITAL_COMMUNITY): Payer: Self-pay

## 2023-02-14 ENCOUNTER — Other Ambulatory Visit (HOSPITAL_COMMUNITY): Payer: Self-pay

## 2023-02-14 ENCOUNTER — Other Ambulatory Visit: Payer: Self-pay | Admitting: Family Medicine

## 2023-02-14 MED ORDER — PANTOPRAZOLE SODIUM 40 MG PO TBEC
40.0000 mg | DELAYED_RELEASE_TABLET | Freq: Every day | ORAL | 0 refills | Status: DC
  Filled 2023-02-14: qty 90, 90d supply, fill #0

## 2023-02-14 MED ORDER — NORTRIPTYLINE HCL 50 MG PO CAPS
150.0000 mg | ORAL_CAPSULE | Freq: Every day | ORAL | 1 refills | Status: DC
  Filled 2023-02-14: qty 270, 90d supply, fill #0
  Filled 2023-05-24: qty 270, 90d supply, fill #1

## 2023-02-14 NOTE — Telephone Encounter (Signed)
Last office visit 10/04/2022 for fall and dizziness.  Last refilled 08/16/2022 for #270 with 1 refill.  Next Appt: No future appointments.   Please call and schedule Medicare Wellness with nurse and CPE with fasting labs prior with Dr. Ermalene Searing.

## 2023-02-17 DIAGNOSIS — S92325D Nondisplaced fracture of second metatarsal bone, left foot, subsequent encounter for fracture with routine healing: Secondary | ICD-10-CM | POA: Diagnosis not present

## 2023-03-07 ENCOUNTER — Telehealth: Payer: Self-pay | Admitting: Family Medicine

## 2023-03-07 NOTE — Telephone Encounter (Signed)
FYI: This call has been transferred to Access Nurse. Once the result note has been entered staff can address the message at that time.  Patient called in with the following symptoms:  Red Word: bad urine smell    Please advise at [facility] [phone]  Message is routed to Provider Pool and [facility] Triage   Pt called stating when she urinates her urine smells bad, as if she has had bowel movement. Pt states her urine smells as if its been inside her for weeks. Transferred pt to access nurse.

## 2023-03-07 NOTE — Telephone Encounter (Signed)
Noted  

## 2023-03-07 NOTE — Telephone Encounter (Signed)
I spoke  with pt; no abd. Pain no fever. Pt said starting 03/05/23 began with urinating less and urine is cloudy and smells really bad. Pt said lower abd is slightly distended. Advised pt she should go to UC or ED now but pt said she cannot afford that and scheduled appt with Dr Patsy Lager on 03/08/23 at 12 noon with UC & ED precautions. Sending note to Dr Patsy Lager and Dr Ermalene Searing who is in office.

## 2023-03-08 ENCOUNTER — Other Ambulatory Visit (HOSPITAL_COMMUNITY): Payer: Self-pay

## 2023-03-08 ENCOUNTER — Encounter: Payer: Self-pay | Admitting: Family Medicine

## 2023-03-08 ENCOUNTER — Ambulatory Visit (INDEPENDENT_AMBULATORY_CARE_PROVIDER_SITE_OTHER): Payer: Medicare Other | Admitting: Family Medicine

## 2023-03-08 VITALS — BP 112/72 | HR 92 | Temp 97.9°F | Ht 63.0 in | Wt 131.4 lb

## 2023-03-08 DIAGNOSIS — N390 Urinary tract infection, site not specified: Secondary | ICD-10-CM

## 2023-03-08 DIAGNOSIS — R8290 Unspecified abnormal findings in urine: Secondary | ICD-10-CM

## 2023-03-08 LAB — POC URINALSYSI DIPSTICK (AUTOMATED)
Bilirubin, UA: NEGATIVE
Blood, UA: NEGATIVE
Glucose, UA: NEGATIVE
Ketones, UA: NEGATIVE
Nitrite, UA: POSITIVE
Protein, UA: NEGATIVE
Spec Grav, UA: 1.01 (ref 1.010–1.025)
Urobilinogen, UA: 0.2 E.U./dL
pH, UA: 7 (ref 5.0–8.0)

## 2023-03-08 MED ORDER — SULFAMETHOXAZOLE-TRIMETHOPRIM 800-160 MG PO TABS
1.0000 | ORAL_TABLET | Freq: Two times a day (BID) | ORAL | 0 refills | Status: DC
  Filled 2023-03-08: qty 14, 7d supply, fill #0

## 2023-03-08 NOTE — Progress Notes (Signed)
Ralph Brouwer T. Navi Erber, MD, CAQ Sports Medicine [facility] [address] [place], [address]  Phone: [phone]  FAX: [phone]  Tiffany [name] - 77 y.o. female  MRN [id number]  Date of Birth: 08-Sep-1945  Date: 03/08/2023  PCP: [name], MD  Referral: [name], MD  Chief Complaint  Patient presents with   Abnormal Urine Odor   Urinary Retention   Subjective:   Tiffany [name] is a 77 y.o. very pleasant female patient with Body mass index is 23.27 kg/m. who presents with the following:  She is a pleasant older lady who presents today with a several day history of dysuria as well as some abnormal scent of her urine.  She also has some mild hypogastric pain.  No nausea, vomiting, diarrhea, cough, congestion, or any other URI type symptoms.  She has a chronic back pain  Hurts to pee  Urination smells abnormal    Review of Systems is noted in the HPI, as appropriate  Objective:   BP 112/72 (BP Location: Left Arm, Patient Position: Sitting, Cuff Size: Normal)   Pulse 92   Temp 97.9 F (36.6 C) (Temporal)   Ht 5\' 3"  (1.6 m)   Wt 131 lb 6 oz (59.6 kg)   SpO2 96%   BMI 23.27 kg/m    GEN: WDWN HEENT: Atraumatc, normocephalic. CV: RRR, No M/G/R PULM: CTA B, No wheezes, crackles, or rhonchi ABD: S, NT, ND, +BS, no rebound. No CVAT. + suprapubic tenderness.   Laboratory and Imaging Data: Results for orders placed or performed in visit on 03/08/23  POCT Urinalysis Dipstick (Automated)  Result Value Ref Range   Color, UA Yellow    Clarity, UA Cloudy    Glucose, UA Negative Negative   Bilirubin, UA Negative    Ketones, UA Negative    Spec Grav, UA 1.010 1.010 - 1.025   Blood, UA Negative    pH, UA 7.0 5.0 - 8.0   Protein, UA Negative Negative   Urobilinogen, UA 0.2 0.2 or 1.0 E.U./dL   Nitrite, UA Positive    Leukocytes, UA Large (3+) (A) Negative     Assessment and Plan:     ICD-10-CM   1.  Acute lower UTI  N39.0     2. Abnormal urine odor  R82.90 POCT Urinalysis Dipstick (Automated)    Urine Culture     History and dip consistent with UTI.  We will treat as such and obtain culture.  Medication Management during today's office visit: Meds ordered this encounter  Medications   sulfamethoxazole-trimethoprim (BACTRIM DS) 800-160 MG tablet    Sig: Take 1 tablet by mouth 2 (two) times daily.    Dispense:  14 tablet    Refill:  0   Medications Discontinued During This Encounter  Medication Reason   methylPREDNISolone (MEDROL) 4 MG TBPK tablet Completed Course   HYDROcodone-acetaminophen (NORCO/VICODIN) 5-325 MG tablet Dose change    Orders placed today for conditions managed today: Orders Placed This Encounter  Procedures   Urine Culture   POCT Urinalysis Dipstick (Automated)    Disposition: No follow-ups on file.  [employer] One speech-to-text software was used for transcription in this dictation.  Possible transcriptional errors can occur using Animal nutritionist.   Signed,  Elpidio Galea. Kemiyah Tarazon, MD   Outpatient Encounter Medications as of 03/08/2023  Medication Sig   atorvastatin (LIPITOR) 10 MG tablet Take 1 tablet (10 mg total) by mouth daily. *Please call our  office to schedule an overdue appointment with Dr. Herbie Baltimore before anymore refills. [phone].   busPIRone (BUSPAR) 5 MG tablet Take 5 mg by mouth 3 (three) times daily.   Cholecalciferol (VITAMIN D3 PO) Take 1 tablet by mouth 3 (three) times a week.   HYDROcodone-acetaminophen (NORCO) 10-325 MG tablet Take 1 tablet by mouth 4 (four) times daily as needed.   methocarbamol (ROBAXIN) 750 MG tablet Take 1 tablet (750 mg total) by mouth 4 (four) times daily as needed.   nortriptyline (PAMELOR) 50 MG capsule Take 3 capsules (150 mg total) by mouth at bedtime.   pantoprazole (PROTONIX) 40 MG tablet Take 1 tablet (40 mg total) by mouth daily.   PARoxetine (PAXIL) 20 MG tablet Take 20 mg by mouth daily.    sennosides-docusate sodium (SENOKOT-S) 8.6-50 MG tablet Take 2 tablets by mouth daily.   sulfamethoxazole-trimethoprim (BACTRIM DS) 800-160 MG tablet Take 1 tablet by mouth 2 (two) times daily.   vitamin B-12 (CYANOCOBALAMIN) 1000 MCG tablet Take 1,000 mcg by mouth daily.   [DISCONTINUED] HYDROcodone-acetaminophen (NORCO/VICODIN) 5-325 MG tablet Take 1 tablet by mouth 4 (four) times daily as needed.   [DISCONTINUED] methylPREDNISolone (MEDROL) 4 MG TBPK tablet Take as directed on package over 6 days.   No facility-administered encounter medications on file as of 03/08/2023.

## 2023-03-09 ENCOUNTER — Other Ambulatory Visit: Payer: Self-pay | Admitting: Family Medicine

## 2023-03-09 ENCOUNTER — Other Ambulatory Visit (HOSPITAL_COMMUNITY): Payer: Self-pay

## 2023-03-10 ENCOUNTER — Other Ambulatory Visit (HOSPITAL_COMMUNITY): Payer: Self-pay

## 2023-03-11 LAB — URINE CULTURE
MICRO NUMBER:: 15514732
SPECIMEN QUALITY:: ADEQUATE

## 2023-03-13 ENCOUNTER — Other Ambulatory Visit (HOSPITAL_COMMUNITY): Payer: Self-pay

## 2023-03-13 ENCOUNTER — Other Ambulatory Visit: Payer: Self-pay | Admitting: Family Medicine

## 2023-03-13 ENCOUNTER — Encounter (HOSPITAL_COMMUNITY): Payer: Self-pay

## 2023-03-17 DIAGNOSIS — S92325D Nondisplaced fracture of second metatarsal bone, left foot, subsequent encounter for fracture with routine healing: Secondary | ICD-10-CM | POA: Diagnosis not present

## 2023-03-30 ENCOUNTER — Other Ambulatory Visit (HOSPITAL_COMMUNITY): Payer: Self-pay

## 2023-03-30 ENCOUNTER — Other Ambulatory Visit: Payer: Self-pay

## 2023-03-30 DIAGNOSIS — M62830 Muscle spasm of back: Secondary | ICD-10-CM | POA: Diagnosis not present

## 2023-03-30 DIAGNOSIS — M47816 Spondylosis without myelopathy or radiculopathy, lumbar region: Secondary | ICD-10-CM | POA: Diagnosis not present

## 2023-03-30 DIAGNOSIS — G894 Chronic pain syndrome: Secondary | ICD-10-CM | POA: Diagnosis not present

## 2023-03-30 DIAGNOSIS — M4802 Spinal stenosis, cervical region: Secondary | ICD-10-CM | POA: Diagnosis not present

## 2023-03-30 MED ORDER — BUSPIRONE HCL 5 MG PO TABS
5.0000 mg | ORAL_TABLET | Freq: Three times a day (TID) | ORAL | 1 refills | Status: DC | PRN
  Filled 2023-03-30: qty 90, 30d supply, fill #0

## 2023-03-30 MED ORDER — DULOXETINE HCL 30 MG PO CPEP
30.0000 mg | ORAL_CAPSULE | Freq: Every day | ORAL | 1 refills | Status: DC
  Filled 2023-03-30: qty 30, 30d supply, fill #0

## 2023-03-30 MED ORDER — HYDROCODONE-ACETAMINOPHEN 10-325 MG PO TABS: 1.0000 | ORAL_TABLET | Freq: Four times a day (QID) | ORAL | 0 refills | Status: DC | PRN

## 2023-04-06 ENCOUNTER — Encounter: Payer: Self-pay | Admitting: Family Medicine

## 2023-04-06 ENCOUNTER — Ambulatory Visit: Payer: Medicare Other | Admitting: Family Medicine

## 2023-04-06 VITALS — BP 110/60 | HR 86 | Temp 98.2°F | Ht 63.0 in | Wt 133.5 lb

## 2023-04-06 DIAGNOSIS — H903 Sensorineural hearing loss, bilateral: Secondary | ICD-10-CM

## 2023-04-06 DIAGNOSIS — Z961 Presence of intraocular lens: Secondary | ICD-10-CM | POA: Diagnosis not present

## 2023-04-06 DIAGNOSIS — H26493 Other secondary cataract, bilateral: Secondary | ICD-10-CM | POA: Diagnosis not present

## 2023-04-06 DIAGNOSIS — H04123 Dry eye syndrome of bilateral lacrimal glands: Secondary | ICD-10-CM | POA: Diagnosis not present

## 2023-04-06 DIAGNOSIS — H35373 Puckering of macula, bilateral: Secondary | ICD-10-CM | POA: Diagnosis not present

## 2023-04-06 DIAGNOSIS — H353131 Nonexudative age-related macular degeneration, bilateral, early dry stage: Secondary | ICD-10-CM | POA: Diagnosis not present

## 2023-04-06 NOTE — Assessment & Plan Note (Signed)
Chronic, inadequate control No clear earwax, effusion or sinus conductive hearing loss.  Most likely aging changes/sensorineural hearing loss.  She did state that she feels her hearing worsened after head injury, so symptoms could be potentially chronic postconcussive symptoms.  Of note head CT in 2022 was within normal range.  Will move forward with referral to audiology for evaluation.
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
## 2023-04-07 ENCOUNTER — Other Ambulatory Visit (HOSPITAL_COMMUNITY): Payer: Self-pay

## 2023-04-07 ENCOUNTER — Encounter: Payer: Self-pay | Admitting: Family Medicine

## 2023-04-07 MED ORDER — HYDROCODONE-ACETAMINOPHEN 10-325 MG PO TABS
1.0000 | ORAL_TABLET | Freq: Four times a day (QID) | ORAL | 0 refills | Status: DC | PRN
  Filled 2023-04-07: qty 120, 30d supply, fill #0

## 2023-05-05 ENCOUNTER — Other Ambulatory Visit (HOSPITAL_COMMUNITY): Payer: Self-pay

## 2023-05-09 ENCOUNTER — Other Ambulatory Visit (HOSPITAL_COMMUNITY): Payer: Self-pay

## 2023-05-09 MED ORDER — HYDROCODONE-ACETAMINOPHEN 10-325 MG PO TABS
1.0000 | ORAL_TABLET | Freq: Four times a day (QID) | ORAL | 0 refills | Status: DC | PRN
  Filled 2023-05-09: qty 120, 30d supply, fill #0

## 2023-05-15 ENCOUNTER — Telehealth: Payer: Self-pay | Admitting: Family Medicine

## 2023-05-15 NOTE — Telephone Encounter (Signed)
FYI: This call has been transferred to Access Nurse. Once the result note has been entered staff can address the message at that time.  Patient called in with the following symptoms:  Red Word:blood in stool Patient called in and stated that she has been having blood in her stool since last Wednesday and it is painful.   Please advise at Mobile [phone] (mobile)  Message is routed to Provider Pool and [facility] Triage

## 2023-05-15 NOTE — Telephone Encounter (Signed)
Patient called back, was advised by access nurse to be seen within 24h, offered patient appointment for tomorrow at [facility]. Patient says she has another appointment tomorrow and is not sure if she will be able to come in. Patient would prefer to come Wednesday. Explained that since patient was advised to be seen within 24 hours and should come in within the 24H. Sending to [facility] triage pool, was not able to make appointment for patient.

## 2023-05-15 NOTE — Telephone Encounter (Signed)
I spoke with Tiffany [name]; Tiffany [name] last BM was last night with blood on tissue when wiped but not blood in water of commode. Tiffany [name] has not had BM or rectal bleeding today. Tiffany [name] said wonders if could have hemorrthoid? UC & ED precautions given and Tiffany [name] voiced understanding. Tiffany [name] already has appt to see B Evelene Croon 12/03/24at 3:40. Sending note to Orvilla Fus NP.

## 2023-05-16 ENCOUNTER — Ambulatory Visit (INDEPENDENT_AMBULATORY_CARE_PROVIDER_SITE_OTHER): Payer: Medicare Other | Admitting: General Practice

## 2023-05-16 ENCOUNTER — Encounter: Payer: Self-pay | Admitting: General Practice

## 2023-05-16 ENCOUNTER — Ambulatory Visit: Payer: Medicare Other | Attending: Family Medicine | Admitting: Audiologist

## 2023-05-16 ENCOUNTER — Other Ambulatory Visit (HOSPITAL_COMMUNITY): Payer: Self-pay

## 2023-05-16 VITALS — BP 120/84 | HR 62 | Temp 98.8°F | Ht 63.0 in | Wt 130.0 lb

## 2023-05-16 DIAGNOSIS — K921 Melena: Secondary | ICD-10-CM | POA: Diagnosis not present

## 2023-05-16 DIAGNOSIS — H903 Sensorineural hearing loss, bilateral: Secondary | ICD-10-CM | POA: Insufficient documentation

## 2023-05-16 LAB — HEMOCCULT GUIAC POC 1CARD (OFFICE): Fecal Occult Blood, POC: NEGATIVE

## 2023-05-16 NOTE — Patient Instructions (Signed)
Stop by the lab prior to leaving today. I will notify you of your results once received.   Please continue to monitor your stool. If you have any more episodes, please make an appointment your Dr. Ermalene Searing.  It was a pleasure meeting you!

## 2023-05-16 NOTE — Telephone Encounter (Signed)
Noted. Thanks for seeing her!

## 2023-05-16 NOTE — Procedures (Signed)
  Outpatient Audiology and [facility] [address] [place], [place]  [address] [phone]  AUDIOLOGICAL  EVALUATION  NAME: Tiffany [name]     DOB:   03/17/1946      MRN: [id number]                                                                                     DATE: 05/16/2023     REFERENT: [name], MD STATUS: Outpatient DIAGNOSIS: Sensorineural Hearing Loss   History: [name] was seen for an audiological evaluation due to difficulty hearing people clearly. She had a fall six months ago hitting the left side of her head. Medical chart review shows normal CT. Ever since the fall she has not heard well. At her club meetings and social events she needs people to repeat themselves. [name] denies pain, pressure, or tinnitus.  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  Medical history shows no conditions with risk for hearing loss.    Evaluation:  Otoscopy showed a clear view of the tympanic membranes, bilaterally Tympanometry results were consistent with normal middle ear function Audiometric testing was completed using Conventional Audiometry techniques with insert earphones and supraural headphones. Test results are consistent with normal hearing sloping to moderate sensorineural hearing loss. Speech Recognition Thresholds were obtained at 50dB HL in the right ear and at 45dB HL in the left ear. Word Recognition Testing was completed at  40dB SL and [name] scored 88% in right ear 92% in left ear.   Results:  The test results were reviewed with [name].  Audiogram printed and provided to [employer]. She needs hearing aids, she has a sloping sensorineural hearing loss bilaterally. No indications that this is due to her fall. Results consistent with progressive noise and age related loss.    Recommendations: Hearing aids recommended for both ears. Patient given handout with information on how to contact [facility] and get set up with any possible hearing aids benefit.  Annual audiometric testing recommended to monitor hearing loss for progression.     32 minutes spent testing and counseling on results.   If you have any questions please feel free to contact me at [phone].  Ammie Ferrier Au.D.  Audiologist   05/16/2023  11:38 AM  Cc: [name], MD

## 2023-05-16 NOTE — Assessment & Plan Note (Addendum)
Rectal exam - negative for anal fissure or hemorrhoids. Guaiac negative.   Suspect this could be secondary to constipation and straining.  Will check labs today to make sure her hemoglobin is still stable.   Discussed with Dr. Ermalene Searing. At this time, patient advised to continue to monitor.  Patient advised to call and make an appointment with PCP if she has any more episodes for anoscopy.   CBC pending.
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
## 2023-05-17 LAB — CBC
HCT: 34.7 % — ABNORMAL LOW (ref 36.0–46.0)
Hemoglobin: 11.5 g/dL — ABNORMAL LOW (ref 12.0–15.0)
MCHC: 33.2 g/dL (ref 30.0–36.0)
MCV: 96.5 fL (ref 78.0–100.0)
Platelets: 347 10*3/uL (ref 150.0–400.0)
RBC: 3.59 Mil/uL — ABNORMAL LOW (ref 3.87–5.11)
RDW: 14.1 % (ref 11.5–15.5)
WBC: 6.9 10*3/uL (ref 4.0–10.5)

## 2023-05-18 NOTE — Progress Notes (Signed)
Noted and agree. 

## 2023-05-24 ENCOUNTER — Other Ambulatory Visit (HOSPITAL_COMMUNITY): Payer: Self-pay

## 2023-05-25 ENCOUNTER — Other Ambulatory Visit (HOSPITAL_COMMUNITY): Payer: Self-pay

## 2023-06-19 ENCOUNTER — Other Ambulatory Visit: Payer: Self-pay

## 2023-06-19 ENCOUNTER — Other Ambulatory Visit (HOSPITAL_COMMUNITY): Payer: Self-pay

## 2023-06-19 DIAGNOSIS — M4802 Spinal stenosis, cervical region: Secondary | ICD-10-CM | POA: Diagnosis not present

## 2023-06-19 DIAGNOSIS — G894 Chronic pain syndrome: Secondary | ICD-10-CM | POA: Diagnosis not present

## 2023-06-19 DIAGNOSIS — M47816 Spondylosis without myelopathy or radiculopathy, lumbar region: Secondary | ICD-10-CM | POA: Diagnosis not present

## 2023-06-19 DIAGNOSIS — M62830 Muscle spasm of back: Secondary | ICD-10-CM | POA: Diagnosis not present

## 2023-06-19 MED ORDER — PAROXETINE HCL 10 MG PO TABS
10.0000 mg | ORAL_TABLET | Freq: Every day | ORAL | 0 refills | Status: DC
  Filled 2023-06-19: qty 30, 30d supply, fill #0

## 2023-06-19 MED ORDER — BUSPIRONE HCL 5 MG PO TABS
5.0000 mg | ORAL_TABLET | Freq: Three times a day (TID) | ORAL | 1 refills | Status: DC | PRN
  Filled 2023-06-19: qty 90, 30d supply, fill #0
  Filled 2023-07-20: qty 90, 30d supply, fill #1

## 2023-06-19 MED ORDER — METHOCARBAMOL 500 MG PO TABS
500.0000 mg | ORAL_TABLET | Freq: Three times a day (TID) | ORAL | 2 refills | Status: DC | PRN
  Filled 2023-06-19: qty 90, 30d supply, fill #0
  Filled 2023-07-20: qty 90, 30d supply, fill #1

## 2023-06-19 MED ORDER — HYDROCODONE-ACETAMINOPHEN 10-325 MG PO TABS
1.0000 | ORAL_TABLET | Freq: Four times a day (QID) | ORAL | 0 refills | Status: DC | PRN
  Filled 2023-06-20 – 2023-07-03 (×3): qty 120, 30d supply, fill #0

## 2023-06-20 ENCOUNTER — Other Ambulatory Visit (HOSPITAL_COMMUNITY): Payer: Self-pay

## 2023-06-27 ENCOUNTER — Telehealth: Payer: Self-pay | Admitting: Family Medicine

## 2023-06-27 NOTE — Telephone Encounter (Signed)
 Received CRM stating pt called saying he missed his telephone visit with nurse health advisor. Pt requested to r/s visit? Call back # [phone]

## 2023-07-03 ENCOUNTER — Other Ambulatory Visit (HOSPITAL_COMMUNITY): Payer: Self-pay

## 2023-07-20 ENCOUNTER — Other Ambulatory Visit (HOSPITAL_COMMUNITY): Payer: Self-pay

## 2023-07-20 ENCOUNTER — Other Ambulatory Visit: Payer: Self-pay | Admitting: Family Medicine

## 2023-07-20 ENCOUNTER — Telehealth: Payer: Self-pay | Admitting: *Deleted

## 2023-07-20 ENCOUNTER — Other Ambulatory Visit: Payer: Self-pay | Admitting: Cardiology

## 2023-07-20 ENCOUNTER — Other Ambulatory Visit: Payer: Self-pay

## 2023-07-20 DIAGNOSIS — E785 Hyperlipidemia, unspecified: Secondary | ICD-10-CM

## 2023-07-20 DIAGNOSIS — Z862 Personal history of diseases of the blood and blood-forming organs and certain disorders involving the immune mechanism: Secondary | ICD-10-CM

## 2023-07-20 MED ORDER — PANTOPRAZOLE SODIUM 40 MG PO TBEC
40.0000 mg | DELAYED_RELEASE_TABLET | Freq: Every day | ORAL | 0 refills | Status: DC
  Filled 2023-07-20: qty 90, 90d supply, fill #0

## 2023-07-20 MED ORDER — ATORVASTATIN CALCIUM 10 MG PO TABS
10.0000 mg | ORAL_TABLET | Freq: Every day | ORAL | 6 refills | Status: DC
  Filled 2023-07-20: qty 30, 30d supply, fill #0
  Filled 2023-08-22: qty 30, 30d supply, fill #1
  Filled 2023-09-27: qty 30, 30d supply, fill #2
  Filled 2023-11-16: qty 30, 30d supply, fill #3
  Filled 2023-12-12 – 2023-12-29 (×2): qty 30, 30d supply, fill #4

## 2023-07-20 NOTE — Telephone Encounter (Signed)
 Lvmtcb, sent mychart message

## 2023-07-20 NOTE — Telephone Encounter (Signed)
 Please schedule Medicare Wellness with Steward Drone and CPE with fasting labs prior with Dr. Ermalene Searing.

## 2023-07-20 NOTE — Telephone Encounter (Signed)
-----   Message from Darcella Earnest sent at 07/20/2023  1:41 PM EST ----- Regarding: Labs for Wednesday  2.19.25 Please put physical lab orders in future. Thank you, [place] Dales

## 2023-07-27 ENCOUNTER — Other Ambulatory Visit (HOSPITAL_COMMUNITY): Payer: Self-pay

## 2023-08-01 ENCOUNTER — Encounter: Payer: Self-pay | Admitting: Family Medicine

## 2023-08-01 ENCOUNTER — Other Ambulatory Visit (HOSPITAL_COMMUNITY): Payer: Self-pay

## 2023-08-01 ENCOUNTER — Other Ambulatory Visit: Payer: Medicare Other

## 2023-08-01 DIAGNOSIS — Z862 Personal history of diseases of the blood and blood-forming organs and certain disorders involving the immune mechanism: Secondary | ICD-10-CM

## 2023-08-01 DIAGNOSIS — E785 Hyperlipidemia, unspecified: Secondary | ICD-10-CM | POA: Diagnosis not present

## 2023-08-01 DIAGNOSIS — M4802 Spinal stenosis, cervical region: Secondary | ICD-10-CM | POA: Diagnosis not present

## 2023-08-01 DIAGNOSIS — G894 Chronic pain syndrome: Secondary | ICD-10-CM | POA: Diagnosis not present

## 2023-08-01 DIAGNOSIS — M47816 Spondylosis without myelopathy or radiculopathy, lumbar region: Secondary | ICD-10-CM | POA: Diagnosis not present

## 2023-08-01 DIAGNOSIS — M62830 Muscle spasm of back: Secondary | ICD-10-CM | POA: Diagnosis not present

## 2023-08-01 LAB — LIPID PANEL
Cholesterol: 161 mg/dL (ref 0–200)
HDL: 70.3 mg/dL (ref >39.00)
LDL Cholesterol: 71 mg/dL (ref 0–99)
NonHDL: 90.94
Total CHOL/HDL Ratio: 2
Triglycerides: 101 mg/dL (ref 0.0–149.0)
VLDL: 20.2 mg/dL (ref 0.0–40.0)

## 2023-08-01 LAB — COMPREHENSIVE METABOLIC PANEL
ALT: 7 U/L (ref 0–35)
AST: 12 U/L (ref 0–37)
Albumin: 3.8 g/dL (ref 3.5–5.2)
Alkaline Phosphatase: 81 U/L (ref 39–117)
BUN: 8 mg/dL (ref 6–23)
CO2: 30 meq/L (ref 19–32)
Calcium: 8.6 mg/dL (ref 8.4–10.5)
Chloride: 99 meq/L (ref 96–112)
Creatinine, Ser: 0.94 mg/dL (ref 0.40–1.20)
GFR: 57.78 mL/min — ABNORMAL LOW (ref >60.00)
Glucose, Bld: 93 mg/dL (ref 70–99)
Potassium: 4.1 meq/L (ref 3.5–5.1)
Sodium: 135 meq/L (ref 135–145)
Total Bilirubin: 0.3 mg/dL (ref 0.2–1.2)
Total Protein: 6.4 g/dL (ref 6.0–8.3)

## 2023-08-01 LAB — CBC WITH DIFFERENTIAL/PLATELET
Basophils Absolute: 0.1 10*3/uL (ref 0.0–0.1)
Basophils Relative: 1.5 % (ref 0.0–3.0)
Eosinophils Absolute: 0.2 10*3/uL (ref 0.0–0.7)
Eosinophils Relative: 3.7 % (ref 0.0–5.0)
HCT: 33.2 % — ABNORMAL LOW (ref 36.0–46.0)
Hemoglobin: 10.8 g/dL — ABNORMAL LOW (ref 12.0–15.0)
Lymphocytes Relative: 25.5 % (ref 12.0–46.0)
Lymphs Abs: 1.7 10*3/uL (ref 0.7–4.0)
MCHC: 32.4 g/dL (ref 30.0–36.0)
MCV: 97.1 fL (ref 78.0–100.0)
Monocytes Absolute: 0.6 10*3/uL (ref 0.1–1.0)
Monocytes Relative: 8.7 % (ref 3.0–12.0)
Neutro Abs: 4.1 10*3/uL (ref 1.4–7.7)
Neutrophils Relative %: 60.6 % (ref 43.0–77.0)
Platelets: 266 10*3/uL (ref 150.0–400.0)
RBC: 3.42 Mil/uL — ABNORMAL LOW (ref 3.87–5.11)
RDW: 14.2 % (ref 11.5–15.5)
WBC: 6.8 10*3/uL (ref 4.0–10.5)

## 2023-08-01 MED ORDER — PAROXETINE HCL 40 MG PO TABS
20.0000 mg | ORAL_TABLET | Freq: Every day | ORAL | 3 refills | Status: DC
  Filled 2023-08-01: qty 30, 30d supply, fill #0
  Filled 2023-08-30: qty 30, 30d supply, fill #1
  Filled 2023-09-27: qty 30, 30d supply, fill #2

## 2023-08-01 MED ORDER — BUSPIRONE HCL 5 MG PO TABS
5.0000 mg | ORAL_TABLET | Freq: Three times a day (TID) | ORAL | 1 refills | Status: DC | PRN
  Filled 2023-08-01 – 2023-08-14 (×3): qty 90, 30d supply, fill #0
  Filled 2023-09-26: qty 90, 30d supply, fill #1

## 2023-08-01 MED ORDER — METHOCARBAMOL 500 MG PO TABS
500.0000 mg | ORAL_TABLET | Freq: Four times a day (QID) | ORAL | 2 refills | Status: DC | PRN
  Filled 2023-08-01 – 2023-08-09 (×4): qty 120, 30d supply, fill #0
  Filled 2023-08-30 – 2023-09-01 (×2): qty 120, 30d supply, fill #1
  Filled 2023-09-26: qty 120, 30d supply, fill #2

## 2023-08-01 MED ORDER — HYDROCODONE-ACETAMINOPHEN 10-325 MG PO TABS
1.0000 | ORAL_TABLET | Freq: Four times a day (QID) | ORAL | 0 refills | Status: DC | PRN
  Filled 2023-08-01: qty 120, 30d supply, fill #0

## 2023-08-02 ENCOUNTER — Other Ambulatory Visit: Payer: Medicare Other

## 2023-08-03 ENCOUNTER — Encounter: Payer: Self-pay | Admitting: Family Medicine

## 2023-08-03 ENCOUNTER — Other Ambulatory Visit (HOSPITAL_COMMUNITY): Payer: Self-pay

## 2023-08-07 ENCOUNTER — Other Ambulatory Visit (HOSPITAL_COMMUNITY): Payer: Self-pay

## 2023-08-08 ENCOUNTER — Encounter: Payer: Self-pay | Admitting: Family Medicine

## 2023-08-08 ENCOUNTER — Other Ambulatory Visit (HOSPITAL_COMMUNITY): Payer: Self-pay

## 2023-08-08 ENCOUNTER — Ambulatory Visit (INDEPENDENT_AMBULATORY_CARE_PROVIDER_SITE_OTHER): Payer: Medicare Other | Admitting: Family Medicine

## 2023-08-08 VITALS — BP 100/60 | HR 79 | Temp 97.5°F | Ht 62.75 in | Wt 130.4 lb

## 2023-08-08 DIAGNOSIS — R6889 Other general symptoms and signs: Secondary | ICD-10-CM | POA: Diagnosis not present

## 2023-08-08 DIAGNOSIS — D649 Anemia, unspecified: Secondary | ICD-10-CM | POA: Insufficient documentation

## 2023-08-08 DIAGNOSIS — K921 Melena: Secondary | ICD-10-CM | POA: Diagnosis not present

## 2023-08-08 DIAGNOSIS — R202 Paresthesia of skin: Secondary | ICD-10-CM | POA: Diagnosis not present

## 2023-08-08 DIAGNOSIS — M810 Age-related osteoporosis without current pathological fracture: Secondary | ICD-10-CM

## 2023-08-08 DIAGNOSIS — I700 Atherosclerosis of aorta: Secondary | ICD-10-CM

## 2023-08-08 DIAGNOSIS — Z0000 Encounter for general adult medical examination without abnormal findings: Secondary | ICD-10-CM

## 2023-08-08 DIAGNOSIS — E785 Hyperlipidemia, unspecified: Secondary | ICD-10-CM

## 2023-08-08 DIAGNOSIS — R200 Anesthesia of skin: Secondary | ICD-10-CM | POA: Diagnosis not present

## 2023-08-08 DIAGNOSIS — I10 Essential (primary) hypertension: Secondary | ICD-10-CM | POA: Diagnosis not present

## 2023-08-08 DIAGNOSIS — F331 Major depressive disorder, recurrent, moderate: Secondary | ICD-10-CM

## 2023-08-08 DIAGNOSIS — F411 Generalized anxiety disorder: Secondary | ICD-10-CM

## 2023-08-08 LAB — CBC WITH DIFFERENTIAL/PLATELET
Basophils Absolute: 0.1 10*3/uL (ref 0.0–0.1)
Basophils Relative: 1.3 % (ref 0.0–3.0)
Eosinophils Absolute: 0.3 10*3/uL (ref 0.0–0.7)
Eosinophils Relative: 5.3 % — ABNORMAL HIGH (ref 0.0–5.0)
HCT: 36.9 % (ref 36.0–46.0)
Hemoglobin: 12 g/dL (ref 12.0–15.0)
Lymphocytes Relative: 22.2 % (ref 12.0–46.0)
Lymphs Abs: 1.4 10*3/uL (ref 0.7–4.0)
MCHC: 32.4 g/dL (ref 30.0–36.0)
MCV: 96.8 fL (ref 78.0–100.0)
Monocytes Absolute: 0.6 10*3/uL (ref 0.1–1.0)
Monocytes Relative: 9.1 % (ref 3.0–12.0)
Neutro Abs: 4 10*3/uL (ref 1.4–7.7)
Neutrophils Relative %: 62.1 % (ref 43.0–77.0)
Platelets: 329 10*3/uL (ref 150.0–400.0)
RBC: 3.81 Mil/uL — ABNORMAL LOW (ref 3.87–5.11)
RDW: 14.6 % (ref 11.5–15.5)
WBC: 6.5 10*3/uL (ref 4.0–10.5)

## 2023-08-08 LAB — VITAMIN B12: Vitamin B-12: 339 pg/mL (ref 211–911)

## 2023-08-08 LAB — IBC + FERRITIN
Ferritin: 12.7 ng/mL (ref 10.0–291.0)
Iron: 78 ug/dL (ref 42–145)
Saturation Ratios: 20.3 % (ref 20.0–50.0)
TIBC: 383.6 ug/dL (ref 250.0–450.0)
Transferrin: 274 mg/dL (ref 212.0–360.0)

## 2023-08-08 LAB — TSH: TSH: 2.18 u[IU]/mL (ref 0.35–5.50)

## 2023-08-08 NOTE — Progress Notes (Signed)
 Patient ID: Tiffany [name], female    DOB: 1945/11/14, 78 y.o.   MRN: [id number]  This visit was conducted in person.  BP 100/60 (BP Location: Left Arm, Patient Position: Sitting, Cuff Size: Normal)   Pulse 79   Temp (!) 97.5 F (36.4 C) (Temporal)   Ht 5' 2.75" (1.594 m)   Wt 130 lb 6 oz (59.1 kg)   SpO2 96%   BMI 23.28 kg/m    CC:  Chief Complaint  Patient presents with   Medicare Wellness    Subjective:   HPI: Tiffany [name] is a 78 y.o. female presenting on 08/08/2023 for Medicare Wellness  The patient presents for   medicare wellness, complete physical and review of chronic health problems. He/She also has the following acute concerns today  She has noted tingling and numbness in  toes and hands,  she is feeling colder than usual, worsening in last year.  She has been more tired lately.  Does note color change in fingers when cold.  I have personally reviewed the Medicare Annual Wellness questionnaire and have noted 1. The patient's medical and social history 2. Their use of alcohol, tobacco or illicit drugs 3. Their current medications and supplements 4. The patient's functional ability including ADL's, fall risks, home safety risks and hearing or visual             impairment. 5. Diet and physical activities 6. Evidence for depression or mood disorders 7.         Updated provider list Cognitive evaluation was performed and recorded on pt medicare questionnaire form. The patients weight, height, BMI and visual acuity have been recorded in the chart   I have made referrals, counseling and provided education to the patient based review of the above and I have provided the pt with a written personalized care plan for preventive services.   Documentation of this information was scanned into the electronic record under the media tab.  Hearing Screening - Comments:: Hearing test done with Audiology-05/16/2023 Vision Screening - Comments:: Wears Glasses-Eye Exam  with Dr. Hazle Quant 03/2023   No falls in last 12 months.  Flowsheet Row Office Visit from 05/16/2023 in [facility]  PHQ-2 Total Score 0       Advance directives and end of life planning reviewed in detail with patient and documented in EMR. Patient given handout on advance care directives if needed. HCPOA and living will updated if needed.    She is seeing Dr. Lovell Sheehan for chronic pain... pain MD will not treat with pain medication given she is on benzodiazapine.  MDD, GAD:   Good control On nortriptyline 150 mg p.o. nightly Buspar 5 mg TID prn and paxil  40 mg daily   She has weaned off alprazolam successfully. PHQ 9: 3 GAD7; 0   Hypertension:  At goal off medication. BP Readings from Last 3 Encounters:  08/08/23 100/60  05/16/23 120/84  04/06/23 110/60  Using medication without problems or lightheadedness:  none Chest pain with exertion: none Edema:none Short of breath:none Average home BPs: Other issues:    Elevated Cholesterol: Improved control on atorvastatin 10 mg p.o. daily ALMOST  at goal LDL less than 70 (aortic atherosclerosis) Lab Results  Component Value Date   CHOL 161 08/01/2023   HDL 70.30 08/01/2023   LDLCALC 71 08/01/2023   LDLDIRECT 138.7 01/12/2012   TRIG 101.0 08/01/2023   CHOLHDL 2 08/01/2023  Using medications without problems: none Muscle  aches: none Diet compliance: more fish Exercise: walking  in good weather Other complaints:   Worsening anemia...  no current blood in stool... Reviewed OV with [place] Charon  in 05/2023... was having straining and constipation., possible int hemorrhoids.. now constipation and rectal pain gone.  2005.. colonoscopy    Relevant past medical, surgical, family and social history reviewed and updated as indicated. Interim medical history since our last visit reviewed. Allergies and medications reviewed and updated. Outpatient Medications Prior to Visit  Medication Sig Dispense  Refill   atorvastatin (LIPITOR) 10 MG tablet Take 1 tablet (10 mg total) by mouth daily. 30 tablet 6   busPIRone (BUSPAR) 5 MG tablet Take 1 tablet (5 mg total) by mouth 3 (three) times daily as needed. 90 tablet 1   Cholecalciferol (VITAMIN D3 PO) Take 1 tablet by mouth 3 (three) times a week.     HYDROcodone-acetaminophen (NORCO) 10-325 MG tablet Take 1 tablet by mouth 4 (four) times daily as needed for pain. 120 tablet 0   methocarbamol (ROBAXIN) 500 MG tablet Take 1 tablet (500 mg total) by mouth 4 (four) times daily as needed. may increase frequency 120 tablet 2   nortriptyline (PAMELOR) 50 MG capsule Take 3 capsules (150 mg total) by mouth at bedtime. 270 capsule 1   pantoprazole (PROTONIX) 40 MG tablet Take 1 tablet (40 mg total) by mouth daily. 90 tablet 0   PARoxetine (PAXIL) 40 MG tablet Take 0.5-1 tablets (20-40 mg total) by mouth daily. 30 tablet 3   sennosides-docusate sodium (SENOKOT-S) 8.6-50 MG tablet Take 2 tablets by mouth daily. 30 tablet 1   vitamin B-12 (CYANOCOBALAMIN) 1000 MCG tablet Take 1,000 mcg by mouth daily.     busPIRone (BUSPAR) 5 MG tablet Take 5 mg by mouth 3 (three) times daily.     DULoxetine (CYMBALTA) 30 MG capsule Take 1 capsule (30 mg total) by mouth daily *stop Paroxetine*. (Patient not taking: Reported on 05/16/2023) 30 capsule 1   HYDROcodone-acetaminophen (NORCO) 10-325 MG tablet Take 1 tablet by mouth 4 (four) times daily as needed.     methocarbamol (ROBAXIN) 500 MG tablet Take 1 tablet (500 mg total) by mouth 3 (three) times daily as needed. 90 tablet 2   PARoxetine (PAXIL) 10 MG tablet Take 1 tablet (10 mg total) by mouth daily. 30 tablet 0   No facility-administered medications prior to visit.     Per HPI unless specifically indicated in ROS section below Review of Systems  Constitutional:  Negative for fatigue and fever.  HENT:  Negative for congestion.   Eyes:  Negative for pain.  Respiratory:  Negative for cough and shortness of breath.    Cardiovascular:  Negative for chest pain, palpitations and leg swelling.  Gastrointestinal:  Negative for abdominal pain.  Genitourinary:  Negative for dysuria and vaginal bleeding.  Musculoskeletal:  Negative for back pain.  Neurological:  Negative for syncope, light-headedness and headaches.  Psychiatric/Behavioral:  Positive for dysphoric mood. Negative for suicidal ideas. The patient is nervous/anxious.    Objective:  BP 100/60 (BP Location: Left Arm, Patient Position: Sitting, Cuff Size: Normal)   Pulse 79   Temp (!) 97.5 F (36.4 C) (Temporal)   Ht 5' 2.75" (1.594 m)   Wt 130 lb 6 oz (59.1 kg)   SpO2 96%   BMI 23.28 kg/m   Wt Readings from Last 3 Encounters:  08/08/23 130 lb 6 oz (59.1 kg)  05/16/23 130 lb (59 kg)  04/06/23 133 lb 8 oz (60.6 kg)  Physical Exam Vitals and nursing note reviewed.  Constitutional:      General: She is not in acute distress.    Appearance: Normal appearance. She is well-developed. She is not ill-appearing or toxic-appearing.  HENT:     Head: Normocephalic.     Right Ear: Hearing, tympanic membrane, ear canal and external ear normal.     Left Ear: Hearing, tympanic membrane, ear canal and external ear normal.     Nose: Nose normal.  Eyes:     General: Lids are normal. Lids are everted, no foreign bodies appreciated.     Conjunctiva/sclera: Conjunctivae normal.     Pupils: Pupils are equal, round, and reactive to light.  Neck:     Thyroid: No thyroid mass or thyromegaly.     Vascular: No carotid bruit.     Trachea: Trachea normal.  Cardiovascular:     Rate and Rhythm: Normal rate and regular rhythm.     Heart sounds: Normal heart sounds, S1 normal and S2 normal. No murmur heard.    No gallop.  Pulmonary:     Effort: Pulmonary effort is normal. No respiratory distress.     Breath sounds: Normal breath sounds. No wheezing, rhonchi or rales.  Abdominal:     General: Bowel sounds are normal. There is no distension or abdominal bruit.      Palpations: Abdomen is soft. There is no fluid wave or mass.     Tenderness: There is no abdominal tenderness. There is no guarding or rebound.     Hernia: No hernia is present.  Musculoskeletal:     Cervical back: Normal range of motion and neck supple.  Lymphadenopathy:     Cervical: No cervical adenopathy.  Skin:    General: Skin is warm and dry.     Findings: No rash.  Neurological:     Mental Status: She is alert.     Cranial Nerves: No cranial nerve deficit.     Sensory: No sensory deficit.  Psychiatric:        Mood and Affect: Mood is not anxious or depressed.        Speech: Speech normal.        Behavior: Behavior normal. Behavior is cooperative.        Judgment: Judgment normal.       Results for orders placed or performed in visit on 08/01/23  CBC with Differential/Platelet   Collection Time: 08/01/23 12:39 PM  Result Value Ref Range   WBC 6.8 4.0 - 10.5 K/uL   RBC 3.42 (L) 3.87 - 5.11 Mil/uL   Hemoglobin 10.8 (L) 12.0 - 15.0 g/dL   HCT 16.1 (L) 09.6 - 04.5 %   MCV 97.1 78.0 - 100.0 fl   MCHC 32.4 30.0 - 36.0 g/dL   RDW 40.9 81.1 - 91.4 %   Platelets 266.0 150.0 - 400.0 K/uL   Neutrophils Relative % 60.6 43.0 - 77.0 %   Lymphocytes Relative 25.5 12.0 - 46.0 %   Monocytes Relative 8.7 3.0 - 12.0 %   Eosinophils Relative 3.7 0.0 - 5.0 %   Basophils Relative 1.5 0.0 - 3.0 %   Neutro Abs 4.1 1.4 - 7.7 K/uL   Lymphs Abs 1.7 0.7 - 4.0 K/uL   Monocytes Absolute 0.6 0.1 - 1.0 K/uL   Eosinophils Absolute 0.2 0.0 - 0.7 K/uL   Basophils Absolute 0.1 0.0 - 0.1 K/uL  Comprehensive metabolic panel   Collection Time: 08/01/23 12:39 PM  Result Value Ref Range   Sodium  135 135 - 145 mEq/L   Potassium 4.1 3.5 - 5.1 mEq/L   Chloride 99 96 - 112 mEq/L   CO2 30 19 - 32 mEq/L   Glucose, Bld 93 70 - 99 mg/dL   BUN 8 6 - 23 mg/dL   Creatinine, Ser 0.98 0.40 - 1.20 mg/dL   Total Bilirubin 0.3 0.2 - 1.2 mg/dL   Alkaline Phosphatase 81 39 - 117 U/L   AST 12 0 - 37 U/L    ALT 7 0 - 35 U/L   Total Protein 6.4 6.0 - 8.3 g/dL   Albumin 3.8 3.5 - 5.2 g/dL   GFR 11.91 (L) >47.82 mL/min   Calcium 8.6 8.4 - 10.5 mg/dL  Lipid panel   Collection Time: 08/01/23 12:39 PM  Result Value Ref Range   Cholesterol 161 0 - 200 mg/dL   Triglycerides 956.2 0.0 - 149.0 mg/dL   HDL 13.08 >65.78 mg/dL   VLDL 46.9 0.0 - 62.9 mg/dL   LDL Cholesterol 71 0 - 99 mg/dL   Total CHOL/HDL Ratio 2    NonHDL 90.94      COVID 19 screen:  No recent travel or known exposure to COVID19 The patient denies respiratory symptoms of COVID 19 at this time. The importance of social distancing was discussed today.   Assessment and Plan   The patient's preventative maintenance and recommended screening tests for an annual wellness exam were reviewed in full today. Brought up to date unless services declined.  Counselled on the importance of diet, exercise, and its role in overall health and mortality. The patient's FH and SH was reviewed, including their home life, tobacco status, and drug and alcohol status.   Pending mammogram   cologuard 2018.. no further indicated  Uptodate with vaccines. S/P COVID x 3, flu, PNA Can consdier shingrix. Last DEXA 2023 T-3.4 osteoporosis.[name] now on prolia.    Problem List Items Addressed This Visit     Aortic atherosclerosis (HCC) (Chronic)   On statin, risk factor reduction      Blood in stool   Was ongoing in December 2024, now resolved.      Relevant Orders   Ambulatory referral to Gastroenterology   Cold intolerance   Acute, possibly secondary to anemia, will evaluate thyroid function.  Normal pulses bilateral hands and feet, symptoms of cold hands and feet with tingling worsened with cold temperatures may be due to [name].  Encouraged patient to keep her extremities warm at all times.      Relevant Orders   Vitamin B12   TSH   CBC with Differential/Platelet   Depression, major, recurrent (HCC) (Chronic)   Chronic,    Good control  On nortriptyline 150 mg p.o. nightly Buspar 5 mg TID prn and paxil  40 mg daily   She has weaned off alprazolam.      Essential hypertension, benign (Chronic)   Stable, chronic.  Continue current medication.  Metoprolol 50 mg 2 times daily         GAD (generalized anxiety disorder) (Chronic)   Chronic,  Good control On nortriptyline 150 mg p.o. nightly Buspar 5 mg TID prn and paxil  40 mg daily   She has weaned off alprazolam.      Hyperlipidemia LDL goal <70   Chronic,  improved control on  atorvastatin 10 mg daily.  LDL goal less than 70 given aortic atherosclerosis Encouraged exercise, weight loss, healthy eating habits.       Normocytic anemia - Primary  Acute, continued worsening of anemia despite resolution of gross blood in stool and rectal pain.  Will reevaluate with CBC, iron panel and refer to GI for anemia workup given last colonoscopy 2005      Relevant Orders   IBC + Ferritin   Ambulatory referral to Gastroenterology   Numbness and tingling in both hands   Relevant Orders   Vitamin B12   TSH   Osteoporosis (Chronic)   Chronic, Recommended Prolia in 2023 but this was not cost effective for the patient.  We were looking into Boniva IV given she had minimal improvement with oral bisphosphonate. I will forward a message to our clinical nurse lead to see how we need to move forward with this.      Other Visit Diagnoses       Medicare annual wellness visit, subsequent           No orders of the defined types were placed in this encounter.  Orders Placed This Encounter  Procedures   Vitamin B12   TSH   CBC with Differential/Platelet   IBC + Ferritin   Ambulatory referral to Gastroenterology    Referral Priority:   Routine    Referral Type:   Consultation    Referral Reason:   Specialty Services Required    Number of Visits Requested:   1     Kerby Nora, MD

## 2023-08-08 NOTE — Assessment & Plan Note (Signed)
 On statin, risk factor reduction

## 2023-08-08 NOTE — Assessment & Plan Note (Signed)
 Was ongoing in December 2024, now resolved.

## 2023-08-08 NOTE — Assessment & Plan Note (Addendum)
 Chronic,    Good control On nortriptyline 150 mg p.o. nightly Buspar 5 mg TID prn and paxil  40 mg daily   She has weaned off alprazolam.

## 2023-08-08 NOTE — Assessment & Plan Note (Signed)
 Acute, continued worsening of anemia despite resolution of gross blood in stool and rectal pain.  Will reevaluate with CBC, iron panel and refer to GI for anemia workup given last colonoscopy 2005

## 2023-08-08 NOTE — Patient Instructions (Addendum)
 Please stop at the lab to have labs drawn. We will refer you to GI for evaluation of cause of anemia worsening even though  blood in stool and rectal pain resolved.

## 2023-08-08 NOTE — Assessment & Plan Note (Signed)
 Chronic, Recommended Prolia in 2023 but this was not cost effective for the patient.  We were looking into Boniva IV given she had minimal improvement with oral bisphosphonate. I will forward a message to our clinical nurse lead to see how we need to move forward with this.

## 2023-08-08 NOTE — Assessment & Plan Note (Signed)
 Chronic,    Good control On nortriptyline 150 mg p.o. nightly Buspar 5 mg TID prn and paxil  40 mg daily   She has weaned off alprazolam.

## 2023-08-08 NOTE — Assessment & Plan Note (Signed)
 Stable, chronic.  Continue current medication.  Metoprolol 50 mg 2 times daily

## 2023-08-08 NOTE — Assessment & Plan Note (Signed)
 Acute, possibly secondary to anemia, will evaluate thyroid function.  Normal pulses bilateral hands and feet, symptoms of cold hands and feet with tingling worsened with cold temperatures may be due to [name].  Encouraged patient to keep her extremities warm at all times.

## 2023-08-08 NOTE — Assessment & Plan Note (Signed)
 Chronic,  improved control on  atorvastatin 10 mg daily.  LDL goal less than 70 given aortic atherosclerosis Encouraged exercise, weight loss, healthy eating habits.

## 2023-08-09 ENCOUNTER — Other Ambulatory Visit (HOSPITAL_COMMUNITY): Payer: Self-pay

## 2023-08-11 ENCOUNTER — Other Ambulatory Visit: Payer: Self-pay | Admitting: Family Medicine

## 2023-08-11 ENCOUNTER — Other Ambulatory Visit (HOSPITAL_COMMUNITY): Payer: Self-pay

## 2023-08-11 MED ORDER — AMLODIPINE BESYLATE 2.5 MG PO TABS
2.5000 mg | ORAL_TABLET | Freq: Every day | ORAL | 11 refills | Status: DC
  Filled 2023-08-11: qty 30, 30d supply, fill #0
  Filled 2023-09-27: qty 30, 30d supply, fill #1
  Filled 2023-12-12 – 2023-12-29 (×3): qty 30, 30d supply, fill #2

## 2023-08-11 NOTE — Progress Notes (Signed)
 Let patient know I will send in a prescription for amlodipine 2.5 mg daily to start for [name].  We can increase this dose if her blood pressure is stable and she is not yet having improvement after several weeks.

## 2023-08-14 ENCOUNTER — Other Ambulatory Visit (HOSPITAL_COMMUNITY): Payer: Self-pay

## 2023-08-15 ENCOUNTER — Encounter: Payer: Self-pay | Admitting: Physician Assistant

## 2023-08-22 ENCOUNTER — Other Ambulatory Visit (HOSPITAL_COMMUNITY): Payer: Self-pay

## 2023-08-22 ENCOUNTER — Other Ambulatory Visit: Payer: Self-pay | Admitting: Family Medicine

## 2023-08-22 MED ORDER — NORTRIPTYLINE HCL 50 MG PO CAPS
150.0000 mg | ORAL_CAPSULE | Freq: Every day | ORAL | 1 refills | Status: DC
  Filled 2023-08-22: qty 270, 90d supply, fill #0
  Filled 2023-11-15: qty 270, 90d supply, fill #1

## 2023-08-22 NOTE — Telephone Encounter (Signed)
 Last office visit 08/08/23 for MWV.  Last refilled 02/14/2023 for #270 with 1 refill.  Next Appt: No future appointments with PCP.

## 2023-08-30 ENCOUNTER — Other Ambulatory Visit (HOSPITAL_COMMUNITY): Payer: Self-pay

## 2023-08-30 DIAGNOSIS — M4802 Spinal stenosis, cervical region: Secondary | ICD-10-CM | POA: Diagnosis not present

## 2023-08-30 DIAGNOSIS — M47816 Spondylosis without myelopathy or radiculopathy, lumbar region: Secondary | ICD-10-CM | POA: Diagnosis not present

## 2023-08-30 DIAGNOSIS — G894 Chronic pain syndrome: Secondary | ICD-10-CM | POA: Diagnosis not present

## 2023-08-30 DIAGNOSIS — M62830 Muscle spasm of back: Secondary | ICD-10-CM | POA: Diagnosis not present

## 2023-08-30 MED ORDER — HYDROCODONE-ACETAMINOPHEN 10-325 MG PO TABS
1.0000 | ORAL_TABLET | Freq: Four times a day (QID) | ORAL | 0 refills | Status: DC | PRN
Start: 2023-09-28 — End: 2023-09-06

## 2023-08-30 MED ORDER — HYDROCODONE-ACETAMINOPHEN 10-325 MG PO TABS
1.0000 | ORAL_TABLET | Freq: Four times a day (QID) | ORAL | 0 refills | Status: DC | PRN
  Filled 2023-08-30: qty 120, 30d supply, fill #0

## 2023-09-03 ENCOUNTER — Emergency Department (HOSPITAL_COMMUNITY)
Admission: EM | Admit: 2023-09-03 | Discharge: 2023-09-03 | Disposition: A | Discharge status: Home / Self Care | Attending: Emergency Medicine | Admitting: Emergency Medicine

## 2023-09-03 ENCOUNTER — Emergency Department (HOSPITAL_COMMUNITY)

## 2023-09-03 ENCOUNTER — Other Ambulatory Visit: Payer: Self-pay

## 2023-09-03 ENCOUNTER — Encounter (HOSPITAL_COMMUNITY): Payer: Self-pay | Admitting: Emergency Medicine

## 2023-09-03 DIAGNOSIS — R109 Unspecified abdominal pain: Secondary | ICD-10-CM | POA: Diagnosis not present

## 2023-09-03 DIAGNOSIS — I10 Essential (primary) hypertension: Secondary | ICD-10-CM | POA: Insufficient documentation

## 2023-09-03 DIAGNOSIS — K838 Other specified diseases of biliary tract: Secondary | ICD-10-CM | POA: Diagnosis not present

## 2023-09-03 DIAGNOSIS — Z91040 Latex allergy status: Secondary | ICD-10-CM | POA: Insufficient documentation

## 2023-09-03 DIAGNOSIS — Z79899 Other long term (current) drug therapy: Secondary | ICD-10-CM | POA: Diagnosis not present

## 2023-09-03 DIAGNOSIS — D72829 Elevated white blood cell count, unspecified: Secondary | ICD-10-CM | POA: Diagnosis not present

## 2023-09-03 DIAGNOSIS — R112 Nausea with vomiting, unspecified: Secondary | ICD-10-CM | POA: Diagnosis not present

## 2023-09-03 LAB — CBC WITH DIFFERENTIAL/PLATELET
Abs Immature Granulocytes: 0.03 10*3/uL (ref 0.00–0.07)
Basophils Absolute: 0 10*3/uL (ref 0.0–0.1)
Basophils Relative: 0 %
Eosinophils Absolute: 0.2 10*3/uL (ref 0.0–0.5)
Eosinophils Relative: 2 %
HCT: 32.6 % — ABNORMAL LOW (ref 36.0–46.0)
Hemoglobin: 10.1 g/dL — ABNORMAL LOW (ref 12.0–15.0)
Immature Granulocytes: 0 %
Lymphocytes Relative: 14 %
Lymphs Abs: 1.3 10*3/uL (ref 0.7–4.0)
MCH: 31.4 pg (ref 26.0–34.0)
MCHC: 31 g/dL (ref 30.0–36.0)
MCV: 101.2 fL — ABNORMAL HIGH (ref 80.0–100.0)
Monocytes Absolute: 0.8 10*3/uL (ref 0.1–1.0)
Monocytes Relative: 9 %
Neutro Abs: 7.2 10*3/uL (ref 1.7–7.7)
Neutrophils Relative %: 75 %
Platelets: 243 10*3/uL (ref 150–400)
RBC: 3.22 MIL/uL — ABNORMAL LOW (ref 3.87–5.11)
RDW: 13.5 % (ref 11.5–15.5)
WBC: 9.6 10*3/uL (ref 4.0–10.5)
nRBC: 0 % (ref 0.0–0.2)

## 2023-09-03 LAB — COMPREHENSIVE METABOLIC PANEL
ALT: 18 U/L (ref 0–44)
AST: 34 U/L (ref 15–41)
Albumin: 3.7 g/dL (ref 3.5–5.0)
Alkaline Phosphatase: 77 U/L (ref 38–126)
Anion gap: 11 (ref 5–15)
BUN: 17 mg/dL (ref 8–23)
CO2: 28 mmol/L (ref 22–32)
Calcium: 9.1 mg/dL (ref 8.9–10.3)
Chloride: 95 mmol/L — ABNORMAL LOW (ref 98–111)
Creatinine, Ser: 1.4 mg/dL — ABNORMAL HIGH (ref 0.44–1.00)
GFR, Estimated: 38 mL/min — ABNORMAL LOW (ref >60)
Glucose, Bld: 105 mg/dL — ABNORMAL HIGH (ref 70–99)
Potassium: 4.3 mmol/L (ref 3.5–5.1)
Sodium: 134 mmol/L — ABNORMAL LOW (ref 135–145)
Total Bilirubin: 0.7 mg/dL (ref 0.0–1.2)
Total Protein: 6.5 g/dL (ref 6.5–8.1)

## 2023-09-03 LAB — ABO/RH: ABO/RH(D): A NEG

## 2023-09-03 LAB — LIPASE, BLOOD: Lipase: 19 U/L (ref 11–51)

## 2023-09-03 LAB — TYPE AND SCREEN
ABO/RH(D): A NEG
Antibody Screen: NEGATIVE

## 2023-09-03 LAB — CBC
HCT: 32.3 % — ABNORMAL LOW (ref 36.0–46.0)
Hemoglobin: 10 g/dL — ABNORMAL LOW (ref 12.0–15.0)
MCH: 31.1 pg (ref 26.0–34.0)
MCHC: 31 g/dL (ref 30.0–36.0)
MCV: 100.3 fL — ABNORMAL HIGH (ref 80.0–100.0)
Platelets: 287 10*3/uL (ref 150–400)
RBC: 3.22 MIL/uL — ABNORMAL LOW (ref 3.87–5.11)
RDW: 13.5 % (ref 11.5–15.5)
WBC: 10.6 10*3/uL — ABNORMAL HIGH (ref 4.0–10.5)
nRBC: 0 % (ref 0.0–0.2)

## 2023-09-03 LAB — POC OCCULT BLOOD, ED: Fecal Occult Bld: NEGATIVE

## 2023-09-03 MED ORDER — HYDROMORPHONE HCL 1 MG/ML IJ SOLN
0.5000 mg | Freq: Once | INTRAMUSCULAR | Status: AC
  Administered 2023-09-03: 0.5 mg via INTRAVENOUS
  Filled 2023-09-03: qty 1

## 2023-09-03 MED ORDER — ONDANSETRON HCL 4 MG/2ML IJ SOLN
4.0000 mg | Freq: Once | INTRAMUSCULAR | Status: AC
  Administered 2023-09-03: 4 mg via INTRAVENOUS
  Filled 2023-09-03: qty 2

## 2023-09-03 MED ORDER — ONDANSETRON 4 MG PO TBDP
4.0000 mg | ORAL_TABLET | Freq: Three times a day (TID) | ORAL | 0 refills | Status: DC | PRN
  Filled 2023-09-03: qty 12, 4d supply, fill #0

## 2023-09-03 MED ORDER — SODIUM CHLORIDE 0.9 % IV BOLUS
1000.0000 mL | Freq: Once | INTRAVENOUS | Status: AC
  Administered 2023-09-03: 1000 mL via INTRAVENOUS

## 2023-09-03 MED ORDER — IOHEXOL 350 MG/ML SOLN
75.0000 mL | Freq: Once | INTRAVENOUS | Status: AC | PRN
  Administered 2023-09-03: 75 mL via INTRAVENOUS

## 2023-09-03 NOTE — Discharge Instructions (Addendum)
 We evaluated you for your nausea and vomiting.  Your testing was reassuring.  We do not think you are having bleeding from your intestinal tract.  Your stool test was negative and your hemoglobin has been stable.  We have prescribed you nausea medication to take at home.  Please be sure to drink lots of fluids.  Blood test showed mild dehydration.  Please follow-up closely with your primary doctor.  Please return if you have any new or worsening symptoms.
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
## 2023-09-03 NOTE — ED Triage Notes (Signed)
 Pt is here for throwing up "dark stuff" looks like coffee grounds since yesterday.  Pt states she has been losing blood but they can't figure out where yet. Pt has thrown up 3-4 times.

## 2023-09-04 ENCOUNTER — Encounter (HOSPITAL_COMMUNITY): Payer: Self-pay

## 2023-09-04 ENCOUNTER — Ambulatory Visit: Payer: Self-pay | Admitting: Family Medicine

## 2023-09-04 ENCOUNTER — Observation Stay (HOSPITAL_COMMUNITY)
Admission: EM | Admit: 2023-09-04 | Discharge: 2023-09-06 | Disposition: A | Discharge status: Home / Self Care | Attending: Internal Medicine | Admitting: Internal Medicine

## 2023-09-04 ENCOUNTER — Other Ambulatory Visit: Payer: Self-pay

## 2023-09-04 ENCOUNTER — Emergency Department (HOSPITAL_COMMUNITY)

## 2023-09-04 ENCOUNTER — Other Ambulatory Visit (HOSPITAL_COMMUNITY): Payer: Self-pay

## 2023-09-04 DIAGNOSIS — X58XXXA Exposure to other specified factors, initial encounter: Secondary | ICD-10-CM | POA: Diagnosis not present

## 2023-09-04 DIAGNOSIS — J9601 Acute respiratory failure with hypoxia: Secondary | ICD-10-CM | POA: Insufficient documentation

## 2023-09-04 DIAGNOSIS — D649 Anemia, unspecified: Secondary | ICD-10-CM | POA: Diagnosis not present

## 2023-09-04 DIAGNOSIS — D62 Acute posthemorrhagic anemia: Secondary | ICD-10-CM | POA: Diagnosis not present

## 2023-09-04 DIAGNOSIS — E785 Hyperlipidemia, unspecified: Secondary | ICD-10-CM | POA: Insufficient documentation

## 2023-09-04 DIAGNOSIS — S60211A Contusion of right wrist, initial encounter: Secondary | ICD-10-CM | POA: Diagnosis not present

## 2023-09-04 DIAGNOSIS — K920 Hematemesis: Principal | ICD-10-CM

## 2023-09-04 DIAGNOSIS — Z91040 Latex allergy status: Secondary | ICD-10-CM | POA: Diagnosis not present

## 2023-09-04 DIAGNOSIS — K2970 Gastritis, unspecified, without bleeding: Secondary | ICD-10-CM | POA: Diagnosis not present

## 2023-09-04 DIAGNOSIS — R1310 Dysphagia, unspecified: Secondary | ICD-10-CM

## 2023-09-04 DIAGNOSIS — M7989 Other specified soft tissue disorders: Secondary | ICD-10-CM | POA: Diagnosis not present

## 2023-09-04 DIAGNOSIS — S60219A Contusion of unspecified wrist, initial encounter: Secondary | ICD-10-CM | POA: Insufficient documentation

## 2023-09-04 DIAGNOSIS — M1811 Unilateral primary osteoarthritis of first carpometacarpal joint, right hand: Secondary | ICD-10-CM | POA: Diagnosis not present

## 2023-09-04 DIAGNOSIS — K838 Other specified diseases of biliary tract: Secondary | ICD-10-CM | POA: Diagnosis not present

## 2023-09-04 DIAGNOSIS — Z79899 Other long term (current) drug therapy: Secondary | ICD-10-CM | POA: Insufficient documentation

## 2023-09-04 DIAGNOSIS — K222 Esophageal obstruction: Secondary | ICD-10-CM

## 2023-09-04 DIAGNOSIS — I700 Atherosclerosis of aorta: Secondary | ICD-10-CM | POA: Diagnosis not present

## 2023-09-04 DIAGNOSIS — Z96611 Presence of right artificial shoulder joint: Secondary | ICD-10-CM | POA: Insufficient documentation

## 2023-09-04 DIAGNOSIS — F419 Anxiety disorder, unspecified: Secondary | ICD-10-CM | POA: Diagnosis not present

## 2023-09-04 DIAGNOSIS — S199XXA Unspecified injury of neck, initial encounter: Secondary | ICD-10-CM | POA: Diagnosis not present

## 2023-09-04 DIAGNOSIS — R42 Dizziness and giddiness: Secondary | ICD-10-CM | POA: Diagnosis not present

## 2023-09-04 DIAGNOSIS — K2100 Gastro-esophageal reflux disease with esophagitis, without bleeding: Secondary | ICD-10-CM | POA: Insufficient documentation

## 2023-09-04 DIAGNOSIS — R1110 Vomiting, unspecified: Principal | ICD-10-CM

## 2023-09-04 DIAGNOSIS — K3189 Other diseases of stomach and duodenum: Secondary | ICD-10-CM | POA: Diagnosis not present

## 2023-09-04 DIAGNOSIS — K219 Gastro-esophageal reflux disease without esophagitis: Secondary | ICD-10-CM | POA: Diagnosis not present

## 2023-09-04 DIAGNOSIS — K449 Diaphragmatic hernia without obstruction or gangrene: Secondary | ICD-10-CM | POA: Insufficient documentation

## 2023-09-04 DIAGNOSIS — I10 Essential (primary) hypertension: Secondary | ICD-10-CM | POA: Diagnosis present

## 2023-09-04 DIAGNOSIS — R1319 Other dysphagia: Secondary | ICD-10-CM

## 2023-09-04 DIAGNOSIS — Z043 Encounter for examination and observation following other accident: Secondary | ICD-10-CM | POA: Diagnosis not present

## 2023-09-04 DIAGNOSIS — R112 Nausea with vomiting, unspecified: Secondary | ICD-10-CM | POA: Diagnosis not present

## 2023-09-04 DIAGNOSIS — I6782 Cerebral ischemia: Secondary | ICD-10-CM | POA: Diagnosis not present

## 2023-09-04 DIAGNOSIS — F411 Generalized anxiety disorder: Secondary | ICD-10-CM | POA: Diagnosis present

## 2023-09-04 DIAGNOSIS — S0990XA Unspecified injury of head, initial encounter: Secondary | ICD-10-CM | POA: Diagnosis not present

## 2023-09-04 DIAGNOSIS — R918 Other nonspecific abnormal finding of lung field: Secondary | ICD-10-CM | POA: Diagnosis not present

## 2023-09-04 DIAGNOSIS — M25531 Pain in right wrist: Secondary | ICD-10-CM | POA: Diagnosis not present

## 2023-09-04 DIAGNOSIS — K259 Gastric ulcer, unspecified as acute or chronic, without hemorrhage or perforation: Secondary | ICD-10-CM | POA: Diagnosis not present

## 2023-09-04 LAB — URINALYSIS, ROUTINE W REFLEX MICROSCOPIC
Bilirubin Urine: NEGATIVE
Glucose, UA: NEGATIVE mg/dL
Ketones, ur: NEGATIVE mg/dL
Nitrite: NEGATIVE
Protein, ur: NEGATIVE mg/dL
Specific Gravity, Urine: 1.005 (ref 1.005–1.030)
pH: 5 (ref 5.0–8.0)

## 2023-09-04 LAB — CBC
HCT: 31.3 % — ABNORMAL LOW (ref 36.0–46.0)
Hemoglobin: 10 g/dL — ABNORMAL LOW (ref 12.0–15.0)
MCH: 31.4 pg (ref 26.0–34.0)
MCHC: 31.9 g/dL (ref 30.0–36.0)
MCV: 98.4 fL (ref 80.0–100.0)
Platelets: 282 10*3/uL (ref 150–400)
RBC: 3.18 MIL/uL — ABNORMAL LOW (ref 3.87–5.11)
RDW: 13.4 % (ref 11.5–15.5)
WBC: 8.4 10*3/uL (ref 4.0–10.5)
nRBC: 0 % (ref 0.0–0.2)

## 2023-09-04 LAB — COMPREHENSIVE METABOLIC PANEL
ALT: 35 U/L (ref 0–44)
AST: 52 U/L — ABNORMAL HIGH (ref 15–41)
Albumin: 3.5 g/dL (ref 3.5–5.0)
Alkaline Phosphatase: 79 U/L (ref 38–126)
Anion gap: 9 (ref 5–15)
BUN: 12 mg/dL (ref 8–23)
CO2: 25 mmol/L (ref 22–32)
Calcium: 8.8 mg/dL — ABNORMAL LOW (ref 8.9–10.3)
Chloride: 99 mmol/L (ref 98–111)
Creatinine, Ser: 0.93 mg/dL (ref 0.44–1.00)
GFR, Estimated: >60 mL/min (ref >60)
Glucose, Bld: 100 mg/dL — ABNORMAL HIGH (ref 70–99)
Potassium: 3.8 mmol/L (ref 3.5–5.1)
Sodium: 133 mmol/L — ABNORMAL LOW (ref 135–145)
Total Bilirubin: 0.7 mg/dL (ref 0.0–1.2)
Total Protein: 6.1 g/dL — ABNORMAL LOW (ref 6.5–8.1)

## 2023-09-04 LAB — TYPE AND SCREEN
ABO/RH(D): A NEG
Antibody Screen: NEGATIVE

## 2023-09-04 LAB — LIPASE, BLOOD: Lipase: 18 U/L (ref 11–51)

## 2023-09-04 LAB — PROTIME-INR
INR: 1.1 (ref 0.8–1.2)
Prothrombin Time: 14 s (ref 11.4–15.2)

## 2023-09-04 MED ORDER — ONDANSETRON HCL 4 MG/2ML IJ SOLN
4.0000 mg | Freq: Once | INTRAMUSCULAR | Status: AC
Start: 2023-09-04 — End: 2023-09-04
  Administered 2023-09-04: 4 mg via INTRAVENOUS
  Filled 2023-09-04: qty 2

## 2023-09-04 MED ORDER — LACTATED RINGERS IV BOLUS
1000.0000 mL | Freq: Once | INTRAVENOUS | Status: AC
  Administered 2023-09-04: 1000 mL via INTRAVENOUS

## 2023-09-04 MED ORDER — ALUM & MAG HYDROXIDE-SIMETH 200-200-20 MG/5ML PO SUSP
30.0000 mL | Freq: Once | ORAL | Status: DC
  Filled 2023-09-04: qty 30

## 2023-09-04 MED ORDER — ACETAMINOPHEN 325 MG PO TABS
650.0000 mg | ORAL_TABLET | Freq: Four times a day (QID) | ORAL | Status: DC | PRN
  Administered 2023-09-05 (×2): 650 mg via ORAL
  Filled 2023-09-04 (×2): qty 2

## 2023-09-04 MED ORDER — PANTOPRAZOLE SODIUM 40 MG IV SOLR
40.0000 mg | Freq: Two times a day (BID) | INTRAVENOUS | Status: DC
  Administered 2023-09-04 – 2023-09-05 (×2): 40 mg via INTRAVENOUS
  Filled 2023-09-04: qty 10

## 2023-09-04 MED ORDER — HYDROCODONE-ACETAMINOPHEN 10-325 MG PO TABS
1.0000 | ORAL_TABLET | Freq: Four times a day (QID) | ORAL | Status: DC | PRN
  Administered 2023-09-04 – 2023-09-06 (×3): 1 via ORAL
  Filled 2023-09-04 (×3): qty 1

## 2023-09-04 MED ORDER — ONDANSETRON HCL 4 MG/2ML IJ SOLN
4.0000 mg | Freq: Four times a day (QID) | INTRAMUSCULAR | Status: DC | PRN
Start: 2023-09-04 — End: 2023-09-06

## 2023-09-04 MED ORDER — PANTOPRAZOLE SODIUM 40 MG IV SOLR
40.0000 mg | INTRAVENOUS | Status: AC
  Administered 2023-09-04 (×2): 40 mg via INTRAVENOUS
  Filled 2023-09-04: qty 10

## 2023-09-04 MED ORDER — ATORVASTATIN CALCIUM 10 MG PO TABS
10.0000 mg | ORAL_TABLET | Freq: Every day | ORAL | Status: DC
  Administered 2023-09-04 – 2023-09-06 (×3): 10 mg via ORAL
  Filled 2023-09-04 (×3): qty 1

## 2023-09-04 MED ORDER — PAROXETINE HCL 20 MG PO TABS
40.0000 mg | ORAL_TABLET | Freq: Every day | ORAL | Status: DC
  Administered 2023-09-05 – 2023-09-06 (×3): 40 mg via ORAL
  Filled 2023-09-04 (×3): qty 2

## 2023-09-04 MED ORDER — ACETAMINOPHEN 650 MG RE SUPP: 650.0000 mg | Freq: Four times a day (QID) | RECTAL | Status: DC | PRN

## 2023-09-04 MED ORDER — ONDANSETRON HCL 4 MG PO TABS: 4.0000 mg | ORAL_TABLET | Freq: Four times a day (QID) | ORAL | Status: DC | PRN

## 2023-09-04 MED ORDER — SODIUM CHLORIDE 0.9% FLUSH: 3.0000 mL | INTRAVENOUS | Status: DC | PRN

## 2023-09-04 MED ORDER — SODIUM CHLORIDE 0.9% FLUSH
3.0000 mL | Freq: Two times a day (BID) | INTRAVENOUS | Status: DC
  Administered 2023-09-05 (×3): 3 mL via INTRAVENOUS
  Administered 2023-09-06: 10 mL via INTRAVENOUS

## 2023-09-04 NOTE — H&P (Signed)
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 present remove PureWick   Initiate Oral Care Protocol   Initiate Carrier Fluid Protocol   RN may order General Admission PRN Orders utilizing "General Admission PRN  medications" (through manage orders) for the following patient needs: allergy symptoms (Claritin), cold sores (Carmex), cough (Robitussin DM), eye irritation (Liquifilm Tears), hemorrhoids (Tucks), indigestion (Maalox), minor skin irritation (Hydrocortisone Cream), muscle pain Romeo Apple Gay), nose irritation (saline nasal spray) and sore throat (Chloraseptic spray).   Full code   Consult to hospitalist   Pulse oximetry check with vital signs   Oxygen therapy Mode or (Route): Nasal cannula; Liters Per Minute: 2; Keep O2 saturation between: greater than 92 %   EKG 12-Lead   Type and screen   Place in observation (patient's expected length of stay will be less than 2 midnights)   Aspiration precautions   Fall precautions

## 2023-09-04 NOTE — Assessment & Plan Note (Signed)
 Suspect 2/2 to UGI source pt has had abnormal egd and esophageal dilation and has intermittent dysphagia symptoms. GI consulted and at bedside. NPO for EGD in am.
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
## 2023-09-04 NOTE — ED Provider Notes (Signed)
 [place] [facility] AT [facility] Provider Note   CSN: [id number] Arrival date & time: 09/04/23  1121     History  Chief Complaint  Patient presents with   Hematemesis    Tiffany [name] is a 78 y.o. female.  78 year old female with past medical history of hypertension hyperlipidemia presenting to the emergency department today with persistent nausea and vomiting.  The patient was seen here yesterday and had a favorable workup.  She was feeling better initially.  She was discharged.  She states that last night when she got home she did have a fall.  She states she is feeling very lightheaded.  She fell and hurt her right wrist.  She did hit her head but did not lose consciousness.  She came to the ER today due to these ongoing symptoms.  States that she has been having some dark appearing emesis this morning as well.        Home Medications Prior to Admission medications   Medication Sig Start Date End Date Taking? Authorizing Provider  amLODipine (NORVASC) 2.5 MG tablet Take 1 tablet (2.5 mg total) by mouth daily. FOR RAYNAUDS 08/11/23   Bedsole, Amy E, MD  atorvastatin (LIPITOR) 10 MG tablet Take 1 tablet (10 mg total) by mouth daily. 07/20/23   Jodelle Gross, NP  busPIRone (BUSPAR) 5 MG tablet Take 1 tablet (5 mg total) by mouth 3 (three) times daily as needed. 08/01/23     Cholecalciferol (VITAMIN D3 PO) Take 1 tablet by mouth 3 (three) times a week.    [provider]  HYDROcodone-acetaminophen (NORCO) 10-325 MG tablet Take 1 tablet by mouth 4 (four) times daily as needed for pain. 08/01/23     HYDROcodone-acetaminophen (NORCO) 10-325 MG tablet Take 1 tablet by mouth 4 (four) times daily as needed for pain. 09/28/23     HYDROcodone-acetaminophen (NORCO) 10-325 MG tablet Take 1 tablet by mouth 4 (four) times daily as needed for pain 08/30/23     methocarbamol (ROBAXIN) 500 MG tablet Take 1 tablet (500 mg total) by mouth 4 (four) times daily as  needed. 08/01/23     nortriptyline (PAMELOR) 50 MG capsule Take 3 capsules (150 mg total) by mouth at bedtime. 08/22/23   Bedsole, Amy E, MD  ondansetron (ZOFRAN-ODT) 4 MG disintegrating tablet Take 1 tablet (4 mg total) by mouth every 8 (eight) hours as needed for nausea or vomiting. 09/03/23   Lonell Grandchild, MD  pantoprazole (PROTONIX) 40 MG tablet Take 1 tablet (40 mg total) by mouth daily. 07/20/23   Bedsole, Amy E, MD  PARoxetine (PAXIL) 40 MG tablet Take 1/2-1 tablet (20-40 mg total) by mouth daily as directed 08/01/23     sennosides-docusate sodium (SENOKOT-S) 8.6-50 MG tablet Take 2 tablets by mouth daily. 10/20/20   Armida Sans, PA-C  vitamin B-12 (CYANOCOBALAMIN) 1000 MCG tablet Take 1,000 mcg by mouth daily.    [provider]      Allergies    Aspirin, Latex, Allegra [fexofenadine], Nsaids, Tramadol, and Zolpidem tartrate    Review of Systems   Review of Systems  Gastrointestinal:  Positive for nausea and vomiting.  Musculoskeletal:        Right wrist pain  Neurological:  Positive for headaches.  All other systems reviewed and are negative.   Physical Exam Updated Vital Signs BP (!) 142/73   Pulse 89   Temp 98.6 F (37 C)   Resp 17   Ht 5\' 2"  (1.575 m)  Wt 59.1 kg   SpO2 94%   BMI 23.83 kg/m  Physical Exam Vitals and nursing note reviewed.   Gen: NAD Eyes: PERRL, EOMI HEENT: no oropharyngeal swelling Neck: trachea midline, no midline tenderness Resp: clear to auscultation bilaterally Card: RRR, no murmurs, rubs, or gallops Abd: nontender, nondistended Extremities: no calf tenderness, no edema, there is some swelling noted over the distal right radius, no snuff box tenderness or tenderness over the hand, the remainder of the extremities are atraumatic Vascular: 2+ radial pulses bilaterally, 2+ DP pulses bilaterally Skin: no rashes Psyc: acting appropriately   ED Results / Procedures / Treatments   Labs (all labs ordered are listed, but only  abnormal results are displayed) Labs Reviewed  COMPREHENSIVE METABOLIC PANEL - Abnormal; Notable for the following components:      Result Value   Sodium 133 (*)    Glucose, Bld 100 (*)    Calcium 8.8 (*)    Total Protein 6.1 (*)    AST 52 (*)    All other components within normal limits  CBC - Abnormal; Notable for the following components:   RBC 3.18 (*)    Hemoglobin 10.0 (*)    HCT 31.3 (*)    All other components within normal limits  LIPASE, BLOOD  URINALYSIS, ROUTINE W REFLEX MICROSCOPIC    EKG None  Radiology CT Head Wo Contrast Result Date: 09/04/2023 CLINICAL DATA:  Head trauma, minor (Age >= 65y); Neck trauma (Age >= 65y). Dizziness and fall with head strike. EXAM: CT HEAD WITHOUT CONTRAST CT CERVICAL SPINE WITHOUT CONTRAST TECHNIQUE: Multidetector CT imaging of the head and cervical spine was performed following the standard protocol without intravenous contrast. Multiplanar CT image reconstructions of the cervical spine were also generated. RADIATION DOSE REDUCTION: This exam was performed according to the departmental dose-optimization program which includes automated exposure control, adjustment of the mA and/or kV according to patient size and/or use of iterative reconstruction technique. COMPARISON:  CT head 10/04/2022.  CT cervical spine 10/08/2019. FINDINGS: CT HEAD FINDINGS Brain: There is no evidence of an acute large territory infarct, intracranial hemorrhage, mass, midline shift, or extra-axial fluid collection. There is mild cerebral atrophy. Patchy to confluent hypodensities in the cerebral white matter bilaterally have mildly progressed and are nonspecific but compatible with moderate chronic small vessel ischemic disease. A chronic lacunar infarct is suspected in the left caudate head. Vascular: Calcified atherosclerosis at the skull base. No hyperdense vessel. Skull: No acute fracture or suspicious lesion. Sinuses/Orbits: Visualized paranasal sinuses and mastoid  air cells are clear. Bilateral cataract extraction. Other: None. CT CERVICAL SPINE FINDINGS Alignment: Chronic reversal of the normal cervical lordosis with minimal anterolisthesis of C2 on C3 and C3 on C4 and minimal retrolisthesis of C5 on C6. Skull base and vertebrae: No acute fracture or suspicious lesion. Soft tissues and spinal canal: No prevertebral fluid or swelling. No visible canal hematoma. Disc levels: Advanced disc degeneration from C4-5 through C6-7 with severe disc space narrowing, vacuum disc phenomenon, and degenerative endplate changes. Milder disc degeneration elsewhere in the cervical spine. Potentially moderate spinal stenosis at C5-6. Advanced multilevel neural foraminal stenosis. Upper chest: No mass or consolidation in the included lung apices. Other: Mild-to-moderate atherosclerotic calcification at the carotid bifurcations. IMPRESSION: 1. No evidence of acute intracranial abnormality or cervical spine fracture. 2. Moderate chronic small vessel ischemic disease. Electronically Signed   By: Sebastian Ache M.D.   On: 09/04/2023 15:50   CT Cervical Spine Wo Contrast Result Date: 09/04/2023 CLINICAL DATA:  Head trauma, minor (Age >= 65y); Neck trauma (Age >= 65y). Dizziness and fall with head strike. EXAM: CT HEAD WITHOUT CONTRAST CT CERVICAL SPINE WITHOUT CONTRAST TECHNIQUE: Multidetector CT imaging of the head and cervical spine was performed following the standard protocol without intravenous contrast. Multiplanar CT image reconstructions of the cervical spine were also generated. RADIATION DOSE REDUCTION: This exam was performed according to the departmental dose-optimization program which includes automated exposure control, adjustment of the mA and/or kV according to patient size and/or use of iterative reconstruction technique. COMPARISON:  CT head 10/04/2022.  CT cervical spine 10/08/2019. FINDINGS: CT HEAD FINDINGS Brain: There is no evidence of an acute large territory infarct,  intracranial hemorrhage, mass, midline shift, or extra-axial fluid collection. There is mild cerebral atrophy. Patchy to confluent hypodensities in the cerebral white matter bilaterally have mildly progressed and are nonspecific but compatible with moderate chronic small vessel ischemic disease. A chronic lacunar infarct is suspected in the left caudate head. Vascular: Calcified atherosclerosis at the skull base. No hyperdense vessel. Skull: No acute fracture or suspicious lesion. Sinuses/Orbits: Visualized paranasal sinuses and mastoid air cells are clear. Bilateral cataract extraction. Other: None. CT CERVICAL SPINE FINDINGS Alignment: Chronic reversal of the normal cervical lordosis with minimal anterolisthesis of C2 on C3 and C3 on C4 and minimal retrolisthesis of C5 on C6. Skull base and vertebrae: No acute fracture or suspicious lesion. Soft tissues and spinal canal: No prevertebral fluid or swelling. No visible canal hematoma. Disc levels: Advanced disc degeneration from C4-5 through C6-7 with severe disc space narrowing, vacuum disc phenomenon, and degenerative endplate changes. Milder disc degeneration elsewhere in the cervical spine. Potentially moderate spinal stenosis at C5-6. Advanced multilevel neural foraminal stenosis. Upper chest: No mass or consolidation in the included lung apices. Other: Mild-to-moderate atherosclerotic calcification at the carotid bifurcations. IMPRESSION: 1. No evidence of acute intracranial abnormality or cervical spine fracture. 2. Moderate chronic small vessel ischemic disease. Electronically Signed   By: Sebastian Ache M.D.   On: 09/04/2023 15:50   DG Chest Portable 1 View Result Date: 09/04/2023 CLINICAL DATA:  Fall EXAM: PORTABLE CHEST 1 VIEW COMPARISON:  09/05/2010 FINDINGS: The heart size and mediastinal contours are within normal limits. Aortic atherosclerosis. Bandlike left basilar opacity, likely atelectasis. No pleural effusion or pneumothorax. The visualized  skeletal structures are unremarkable. IMPRESSION: Band-like left basilar opacity, likely atelectasis. Electronically Signed   By: Duanne Guess D.O.   On: 09/04/2023 14:33   DG Wrist Complete Right Result Date: 09/04/2023 CLINICAL DATA:  Right wrist pain and swelling status post fall at home. EXAM: RIGHT WRIST - COMPLETE 3+ VIEW COMPARISON:  None Available. FINDINGS: There is diffuse decreased bone mineralization. No definite acute fracture line is seen. There are two likely chronic, likely corticated ossicles measuring up to 3 mm assess subtalar and styloid. Additional calcification overlying the lateral aspect of the triangular fibrocartilage complex. Mild radiocarpal joint space narrowing. Moderate thumb carpometacarpal and mild triscaphe joint space narrowing and peripheral osteophytosis. Mild radiocarpal joint space narrowing. Mild degenerative cystic changes in the scaphoid side of the scapholunate articulation. Mild degenerative cystic changes within the distal radial styloid. IMPRESSION: 1. No definite acute fracture is seen. 2. Moderate thumb carpometacarpal and mild triscaphe osteoarthritis. 3. Likely old nonunited fracture of the ulnar styloid. Electronically Signed   By: Neita Garnet M.D.   On: 09/04/2023 13:58   CT ABDOMEN PELVIS W CONTRAST Result Date: 09/03/2023 CLINICAL DATA:  Acute abdominal pain EXAM: CT ABDOMEN AND PELVIS WITH CONTRAST TECHNIQUE:  Multidetector CT imaging of the abdomen and pelvis was performed using the standard protocol following bolus administration of intravenous contrast. RADIATION DOSE REDUCTION: This exam was performed according to the departmental dose-optimization program which includes automated exposure control, adjustment of the mA and/or kV according to patient size and/or use of iterative reconstruction technique. CONTRAST:  75mL OMNIPAQUE IOHEXOL 350 MG/ML SOLN COMPARISON:  04/27/2020 FINDINGS: Lower chest: Descending thoracic aortic atherosclerosis. Mild  mitral valve calcification. Dependent atelectasis in both lower lobes with a calcified granuloma in the left lower lobe. Mild cardiomegaly. Small air-level in the distal esophagus, cannot exclude dysmotility. Hepatobiliary: Mild nonspecific periportal edema. Hypodensity in segment 4 adjacent to the falciform ligament likely from focal steatosis. Mildly dilated common bile duct at 0.9 cm in diameter, although stable from 04/27/2020. Pancreas: Unremarkable Spleen: Punctate calcifications from old granulomatous disease. Adrenals/Urinary Tract: Unremarkable Stomach/Bowel: No dilated bowel. Fat deposition in the wall of the terminal ileum cecum can have a weak association with inflammatory bowel disease but is usually incidental. There is also fat deposition in the wall of the appendix which appears otherwise unremarkable. Vascular/Lymphatic: Atherosclerosis is present, including aortoiliac atherosclerotic disease. Atherosclerosis dorsally at the origin of the celiac trunk without critical stenosis. The superior mesenteric artery appears widely patent. Atheromatous vascular calcifications of the origins of the renal arteries. No pathologic adenopathy. Reproductive: Unremarkable Other: Stable subtle haziness along the mesentery and omentum, no change from 2021, likely incidental. Musculoskeletal: Mild levoconvex lumbar scoliosis along with lumbar spondylosis and degenerative disc disease contributing to multilevel impingement. Grade 1 degenerative retrolisthesis at L3-4 and L4-5 with grade 1 degenerative anterolisthesis at L5-S1. IMPRESSION: 1. No specific abnormality is identified to explain the patient's abdominal pain. 2. Mild cardiomegaly. 3. Small air-level in the distal esophagus, cannot exclude dysmotility. 4. Mildly dilated common bile duct at 0.9 cm in diameter, although stable from 04/27/2020. 5. Mild periportal edema, as can be seen in a variety of conditions including congestive heart failure, inflammation,  following trauma, poor lymphatic drainage, and in a variety of other conditions. 6. Fat deposition in the wall of the terminal ileum and cecum can have a weak association with inflammatory bowel disease but is usually incidental. 7. Lumbar spondylosis and degenerative disc disease contributing to multilevel impingement. 8.  Aortic Atherosclerosis (ICD10-I70.0). Electronically Signed   By: Gaylyn Rong M.D.   On: 09/03/2023 14:12    Procedures Procedures    Medications Ordered in ED Medications  alum & mag hydroxide-simeth (MAALOX/MYLANTA) 200-200-20 MG/5ML suspension 30 mL (0 mLs Oral Hold 09/04/23 1357)  ondansetron (ZOFRAN) injection 4 mg (4 mg Intravenous Given 09/04/23 1354)  lactated ringers bolus 1,000 mL (1,000 mLs Intravenous New Bag/Given 09/04/23 1354)    ED Course/ Medical Decision Making/ A&P                                 Medical Decision Making 78 year old female with past medical history of hypertension hyperlipidemia presenting to the emergency department today with persistent nausea and vomiting and lightheadedness.  The patient's symptoms have persisted after being discharged yesterday with favorable workup.  She did have Hemoccult testing yesterday which was negative.  Will repeat labs here.  Will obtain CT scan of her head and cervical spine as well as an x-ray of her right wrist for evaluation for acute traumatic injuries.  I will give patient Zofran as well as IV fluids.  Given her persistent symptoms she will likely require  admission and possible GI consultation.  The patient's labs here are reassuring.  CT scans and x-rays do not show any concerning findings.  Given the patient's multiple visits and fall at home my calls placed hospital service for admission for GI consultation and further evaluation.  Amount and/or Complexity of Data Reviewed Labs: ordered. Radiology: ordered.  Risk OTC drugs. Prescription drug management. Decision regarding  hospitalization.           Final Clinical Impression(s) / ED Diagnoses Final diagnoses:  Intractable vomiting  Contusion of wrist, unspecified laterality, initial encounter  Dark emesis    Rx / DC Orders ED Discharge Orders     None         Durwin Glaze, MD 09/04/23 [id number]
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
## 2023-09-04 NOTE — Assessment & Plan Note (Signed)
 Type/ screen h/h q4 h, IV ppi.

## 2023-09-04 NOTE — Telephone Encounter (Signed)
 Copied from CRM [id number]. Topic: Clinical - Red Word Triage >> Sep 04, 2023  9:58 AM Isabell A wrote: Red Word that prompted transfer to Nurse Triage: Patient seen at ER yesterday for vomiting black stuff, she was sent home with a bag of fluids. Patient had a fall when she came home, states she's feeling nausea.  Chief Complaint: Fall Symptoms: Headache, dizziness, vomiting Frequency: Yesterday Pertinent Negatives: Patient denies relief Disposition: [x] ED /[] Urgent Care (no appt availability in office) / [] Appointment(In office/virtual)/ []  [place] [facility]/ [] Home Care/ [] Refused Recommended Disposition /[] [place] [facility]/ []  Follow-up with PCP Additional Notes: Patient was seen in the ED yesterday for vomiting. Patient stated she was discharged yesterday and had a fall when she got home. Patient stated a dizzy spell caused the fall. Patient reported that she is still experiencing dizziness. Patient stated her head hit the ground and she is presenting with a headache at this time. Patient stated she is still vomiting black matter. This RN advised ED, per protocol. Patient stated her daughter is on the way to take her at this time. This RN advised patient to call back if anything changes. Patient complied.   Reason for Disposition  Injury (or injuries) that need emergency care  Answer Assessment - Initial Assessment Questions 1. MECHANISM: "How did the fall happen?"     States she had a dizzy spell when opening up back door 3. ONSET: "When did the fall happen?" (e.g., minutes, hours, or days ago)     Yesterday afternoon when she got home from from ED 4. LOCATION: "What part of the body hit the ground?" (e.g., back, buttocks, head, hips, knees, hands, head, stomach)     States she hit the left side of her head when she fell, states her lower leg also hit the ground, denies losing consciousness  5. INJURY: "Did you hurt (injure) yourself when you fell?" If Yes, ask: "What did you  injure? Tell me more about this?" (e.g., body area; type of injury; pain severity)"     States she feels sore all over, slight headache 6. PAIN: "Is there any pain?" If Yes, ask: "How bad is the pain?" (e.g., Scale 1-10; or mild,  moderate, severe)   - NONE (0): No pain   - MILD (1-3): Doesn't interfere with normal activities    - MODERATE (4-7): Interferes with normal activities or awakens from sleep    - SEVERE (8-10): Excruciating pain, unable to do any normal activities      Rates pain an 8 7. SIZE: For cuts, bruises, or swelling, ask: "How large is it?" (e.g., inches or centimeters)      Bruises on lower leg that hit ground 9. OTHER SYMPTOMS: "Do you have any other symptoms?" (e.g., dizziness, fever, weakness; new onset or worsening).      Dizziness, vomiting black matter 10. CAUSE: "What do you think caused the fall (or falling)?" (e.g., tripped, dizzy spell)     Dizzy spell  Protocols used: Falls and [facility]

## 2023-09-04 NOTE — ED Triage Notes (Addendum)
 Pt c/o hematemesis blackx3d. Pt's daughter since pt has not been able to drink or eatx3d. Pt states she got dizzy yesterday and fell and hit her head. Pt denies blood thinners. Pt denies LOC. Pt c/o right wrist pain and left leg pain. Pt has 2+ swelling of right wrist.

## 2023-09-04 NOTE — Assessment & Plan Note (Signed)
 IV ppi 80 mg x 1 followed with q12 h of protonix.

## 2023-09-04 NOTE — Assessment & Plan Note (Signed)
 Cont buspar and paxil.

## 2023-09-04 NOTE — Assessment & Plan Note (Signed)
 Vitals:   09/04/23 1132 09/04/23 1400  BP: (!) 139/59 (!) 142/73  Currently will hold amlodipine due to her dizziness and GIB suspect she is orthostatic. We will measure orthostatic and PRN hydralazine.

## 2023-09-04 NOTE — ED Notes (Signed)
 Pt unable to provide urine sample at this time

## 2023-09-05 ENCOUNTER — Encounter (HOSPITAL_COMMUNITY): Payer: Self-pay | Admitting: Internal Medicine

## 2023-09-05 ENCOUNTER — Observation Stay (HOSPITAL_COMMUNITY): Admitting: Anesthesiology

## 2023-09-05 ENCOUNTER — Encounter (HOSPITAL_COMMUNITY): Admission: EM | Disposition: A | Discharge status: Home / Self Care | Payer: Self-pay | Source: Home / Self Care

## 2023-09-05 DIAGNOSIS — K449 Diaphragmatic hernia without obstruction or gangrene: Secondary | ICD-10-CM

## 2023-09-05 DIAGNOSIS — K222 Esophageal obstruction: Secondary | ICD-10-CM | POA: Diagnosis not present

## 2023-09-05 DIAGNOSIS — K3189 Other diseases of stomach and duodenum: Secondary | ICD-10-CM | POA: Diagnosis not present

## 2023-09-05 DIAGNOSIS — K259 Gastric ulcer, unspecified as acute or chronic, without hemorrhage or perforation: Secondary | ICD-10-CM

## 2023-09-05 DIAGNOSIS — K2210 Ulcer of esophagus without bleeding: Secondary | ICD-10-CM | POA: Diagnosis not present

## 2023-09-05 DIAGNOSIS — K920 Hematemesis: Secondary | ICD-10-CM | POA: Diagnosis not present

## 2023-09-05 DIAGNOSIS — K838 Other specified diseases of biliary tract: Secondary | ICD-10-CM | POA: Diagnosis not present

## 2023-09-05 DIAGNOSIS — K2100 Gastro-esophageal reflux disease with esophagitis, without bleeding: Secondary | ICD-10-CM

## 2023-09-05 DIAGNOSIS — K209 Esophagitis, unspecified without bleeding: Secondary | ICD-10-CM | POA: Diagnosis not present

## 2023-09-05 DIAGNOSIS — D649 Anemia, unspecified: Secondary | ICD-10-CM | POA: Diagnosis not present

## 2023-09-05 DIAGNOSIS — I10 Essential (primary) hypertension: Secondary | ICD-10-CM | POA: Diagnosis not present

## 2023-09-05 DIAGNOSIS — K2970 Gastritis, unspecified, without bleeding: Secondary | ICD-10-CM | POA: Diagnosis not present

## 2023-09-05 DIAGNOSIS — R1310 Dysphagia, unspecified: Secondary | ICD-10-CM | POA: Diagnosis not present

## 2023-09-05 HISTORY — PX: ESOPHAGOGASTRODUODENOSCOPY: SHX5428

## 2023-09-05 HISTORY — PX: ESOPHAGEAL DILATION: SHX303

## 2023-09-05 LAB — HEMOGLOBIN AND HEMATOCRIT, BLOOD
HCT: 28.3 % — ABNORMAL LOW (ref 36.0–46.0)
HCT: 28.5 % — ABNORMAL LOW (ref 36.0–46.0)
HCT: 28.9 % — ABNORMAL LOW (ref 36.0–46.0)
HCT: 29.6 % — ABNORMAL LOW (ref 36.0–46.0)
Hemoglobin: 9.2 g/dL — ABNORMAL LOW (ref 12.0–15.0)
Hemoglobin: 9.4 g/dL — ABNORMAL LOW (ref 12.0–15.0)
Hemoglobin: 9.4 g/dL — ABNORMAL LOW (ref 12.0–15.0)
Hemoglobin: 9.4 g/dL — ABNORMAL LOW (ref 12.0–15.0)

## 2023-09-05 SURGERY — EGD (ESOPHAGOGASTRODUODENOSCOPY)
Anesthesia: Monitor Anesthesia Care

## 2023-09-05 MED ORDER — PROPOFOL 500 MG/50ML IV EMUL
INTRAVENOUS | Status: DC | PRN
  Administered 2023-09-05: 75 ug/kg/min via INTRAVENOUS

## 2023-09-05 MED ORDER — NORTRIPTYLINE HCL 25 MG PO CAPS
150.0000 mg | ORAL_CAPSULE | Freq: Every day | ORAL | Status: DC
  Administered 2023-09-05 (×2): 150 mg via ORAL
  Filled 2023-09-05 (×2): qty 6

## 2023-09-05 MED ORDER — ONDANSETRON HCL 4 MG/2ML IJ SOLN: 4.0000 mg | Freq: Once | INTRAMUSCULAR | Status: DC | PRN

## 2023-09-05 MED ORDER — METHOCARBAMOL 500 MG PO TABS
500.0000 mg | ORAL_TABLET | Freq: Four times a day (QID) | ORAL | Status: DC | PRN
  Administered 2023-09-05: 500 mg via ORAL
  Filled 2023-09-05: qty 1

## 2023-09-05 MED ORDER — SODIUM CHLORIDE 0.9 % IV SOLN
INTRAVENOUS | Status: DC | PRN
Start: 2023-09-05 — End: 2023-09-05

## 2023-09-05 MED ORDER — ONDANSETRON HCL 4 MG/2ML IJ SOLN
INTRAMUSCULAR | Status: DC | PRN
  Administered 2023-09-05: 4 mg via INTRAVENOUS

## 2023-09-05 MED ORDER — PROPOFOL 10 MG/ML IV BOLUS
INTRAVENOUS | Status: DC | PRN
  Administered 2023-09-05 (×4): 20 mg via INTRAVENOUS

## 2023-09-05 MED ORDER — PANTOPRAZOLE SODIUM 40 MG PO TBEC
40.0000 mg | DELAYED_RELEASE_TABLET | Freq: Two times a day (BID) | ORAL | Status: DC
  Administered 2023-09-05 – 2023-09-06 (×3): 40 mg via ORAL
  Filled 2023-09-05 (×3): qty 1

## 2023-09-05 MED ORDER — AMISULPRIDE (ANTIEMETIC) 5 MG/2ML IV SOLN: 10.0000 mg | Freq: Once | INTRAVENOUS | Status: DC | PRN

## 2023-09-05 MED ORDER — LIDOCAINE 2% (20 MG/ML) 5 ML SYRINGE
INTRAMUSCULAR | Status: DC | PRN
  Administered 2023-09-05: 60 mg via INTRAVENOUS

## 2023-09-05 NOTE — Anesthesia Postprocedure Evaluation (Signed)
 Anesthesia Post Note  Patient: Tiffany [name]  Procedure(s) Performed: EGD (ESOPHAGOGASTRODUODENOSCOPY) DILATION, ESOPHAGUS     Patient location during evaluation: PACU Anesthesia Type: MAC Level of consciousness: awake and alert Pain management: pain level controlled Vital Signs Assessment: post-procedure vital signs reviewed and stable Respiratory status: spontaneous breathing, nonlabored ventilation, respiratory function stable and patient connected to nasal cannula oxygen Cardiovascular status: stable and blood pressure returned to baseline Postop Assessment: no apparent nausea or vomiting Anesthetic complications: no  No notable events documented.  Last Vitals:  Vitals:   09/05/23 1220 09/05/23 1300  BP: (!) 141/84 120/77  Pulse: 87 90  Resp: 16 (!) 21  Temp:    SpO2: 96% (!) 86%    Last Pain:  Vitals:   09/05/23 1305  TempSrc:   PainSc: 5                  Shelton Silvas

## 2023-09-05 NOTE — Plan of Care (Signed)

## 2023-09-05 NOTE — Plan of Care (Signed)
   Problem: Education: Goal: Knowledge of General Education information will improve Description Including pain rating scale, medication(s)/side effects and non-pharmacologic comfort measures Outcome: Progressing   Problem: Health Behavior/Discharge Planning: Goal: Ability to manage health-related needs will improve Outcome: Progressing

## 2023-09-05 NOTE — Telephone Encounter (Signed)
 Noted and agree with ER.

## 2023-09-05 NOTE — Transfer of Care (Signed)
 Immediate Anesthesia Transfer of Care Note  Patient: Tiffany [name]  Procedure(s) Performed: EGD (ESOPHAGOGASTRODUODENOSCOPY) DILATION, ESOPHAGUS  Patient Location: PACU  Anesthesia Type:MAC  Level of Consciousness: awake and drowsy  Airway & Oxygen Therapy: Patient connected to face mask oxygen  Post-op Assessment: Report given to RN, Post -op Vital signs reviewed and stable, and Patient moving all extremities  Post vital signs: Reviewed and stable  Last Vitals:  Vitals Value Taken Time  BP 150/70   Temp    Pulse 102 09/05/23 1210  Resp 17 09/05/23 1209  SpO2 96 % 09/05/23 1210  Vitals shown include unfiled device data.  Last Pain:  Vitals:   09/05/23 1029  TempSrc: Temporal  PainSc: 0-No pain         Complications: No notable events documented.

## 2023-09-05 NOTE — Care Management Obs Status (Signed)
 MEDICARE OBSERVATION STATUS NOTIFICATION   Patient Details  Name: Tiffany [name] MRN: [id number] Date of Birth: 1945/08/17   Medicare Observation Status Notification Given:  Yes    Leone Haven, RN 09/05/2023, 1:44 PM

## 2023-09-05 NOTE — Op Note (Signed)
 [facility] Patient Name: Tiffany [name] Procedure Date : 09/05/2023 MRN: [id number] Attending MD: Doristine Locks , MD, [id number] Date of Birth: 09/08/1945 CSN: [id number] Age: 78 Admit Type: Inpatient Procedure:                Upper GI endoscopy Indications:              Dysphagia, Esophageal reflux, Coffee-ground emesis,                            Regurgitation Providers:                Doristine Locks, MD, Martha Clan, RN, Rhodia Albright, Technician Referring MD:              Medicines:                Monitored Anesthesia Care Complications:            No immediate complications. Estimated Blood Loss:     Estimated blood loss was minimal. Procedure:                Pre-Anesthesia Assessment:                           - Prior to the procedure, a History and Physical                            was performed, and patient medications and                            allergies were reviewed. The patient's tolerance of                            previous anesthesia was also reviewed. The risks                            and benefits of the procedure and the sedation                            options and risks were discussed with the patient.                            All questions were answered, and informed consent                            was obtained. Prior Anticoagulants: The patient has                            taken no anticoagulant or antiplatelet agents. ASA                            Grade Assessment: III - A patient with severe                            systemic  disease. After reviewing the risks and                            benefits, the patient was deemed in satisfactory                            condition to undergo the procedure.                           After obtaining informed consent, the endoscope was                            passed under direct vision. Throughout the                            procedure, the  patient's blood pressure, pulse, and                            oxygen saturations were monitored continuously. The                            [id number]-H190 ([id number]) Olympus endoscope was introduced                            through the mouth, and advanced to the second part                            of duodenum. The upper GI endoscopy was                            accomplished without difficulty. The patient                            tolerated the procedure well. Scope In: Scope Out: Findings:      LA Grade B (one or more mucosal breaks greater than 5 mm, not extending       between the tops of two mucosal folds) esophagitis with no bleeding was       found in the lower third of the esophagus.      One benign-appearing, intrinsic mild stenosis was found in the lower       third of the esophagus. This stenosis measured 1 cm (in length). The       stenosis was traversed. A TTS dilator was passed through the scope.       Dilation with an 18-19-20 mm balloon dilator was performed to 20 mm. The       dilation site was examined and showed mild mucosal disruption and       moderate improvement in luminal narrowing. Biopsies were then obtained       from the proximal and distal esophagus with cold forceps for histology.      A 1 cm sliding type hiatal hernia was present.      Patchy mild inflammation characterized by congestion (edema) and       erythema was found in the gastric fundus, in the gastric body and in the       gastric antrum.  There was a single shallow erosion in the distal gastric       body. Biopsies were taken with a cold forceps for Helicobacter pylori       testing. Estimated blood loss was minimal.      The examined duodenum was normal. Impression:               - LA Grade B reflux esophagitis with no bleeding.                           - Benign-appearing esophageal stenosis. Dilated                            then the esophagus was biopsied.                           - 1 cm  sliding type hiatal hernia.                           - Gastritis. Biopsied.                           - Normal examined duodenum. Recommendation:           - Return patient to hospital ward for ongoing care.                           - Full liquids now, then can advance to soft diet                            today. Can slowly start advancing diet tomorrow as                            tolerated.                           - Use Protonix (pantoprazole) 40 mg PO BID for 6                            weeks, then reduce to 40 mg daily for continued                            control of reflux.                           - Await pathology results.                           - Repeat upper endoscopy in 6-8 weeks to check                            healing.                           - Repeat CBC check 7 to 10 days after hospital  discharge                           - Will start coordinating outpatient follow-up                            appointment with [place].                           - Inpatient GI service will sign off at this time.                            Please do not hesitate to contact us with any                            additional questions or concerns. Procedure Code(s):        --- Professional ---                           [id number], Esophagogastroduodenoscopy, flexible,                            transoral; with transendoscopic balloon dilation of                            esophagus (less than 30 mm diameter)                           43239, 59, Esophagogastroduodenoscopy, flexible,                            transoral; with biopsy, single or multiple Diagnosis Code(s):        --- Professional ---                           K21.00, Gastro-esophageal reflux disease with                            esophagitis, without bleeding                           K22.2, Esophageal obstruction                           K44.9, Diaphragmatic hernia without obstruction or                             gangrene                           K29.70, Gastritis, unspecified, without bleeding                           R13.10, Dysphagia, unspecified                           K92.0, Hematemesis  R11.10, Vomiting, unspecified CPT copyright 2022 [employer]. All rights reserved. The codes documented in this report are preliminary and upon coder review may  be revised to meet current compliance requirements. Doristine Locks, MD 09/05/2023 12:26:26 PM Number of Addenda: 0

## 2023-09-05 NOTE — Progress Notes (Signed)
 PROGRESS NOTE    Tiffany [name]  ZOX:[id number] DOB: 1946/06/03 DOA: 09/04/2023 PCP: [name], MD   Brief Narrative:  This 78 years old female with PMH significant for hypertension, GERD, arthritis presented in the ED with nausea, vomiting and coffee-ground emesis in the last few days.  Patient presented in the ED yesterday with similar presentation,  stool for occult blood was negative, CTA was negative.  Patient was discharged home.  Patient reports fall at home, she hit the head but denies any loss of consciousness.  Skeletal workup unremarkable in the ED.  H&H remains stable.  CT Abd. shows no specific abnormality.  Patient is admitted for further evaluation.  Patient is scheduled to have EGD today.  Assessment & Plan:   Principal Problem:   Hematemesis Active Problems:   Essential hypertension, benign   GERD   GAD (generalized anxiety disorder)   ABLA (acute blood loss anemia)   Esophageal dysphagia  Hemetemesis: Patient presented with nausea , vomiting and coffee-ground emesis. Suspect secondary to upper GI source.   Patient had abnormal EGD and esophageal dilatation and intermittent dysphagia symptoms for some time. GI is consulted.  Patient is scheduled to have EGD today. H&H remains stable. Continue IV Protonix 40 mg every 12 hours.  Essential hypertension: Currently blood pressure is on the soft side. Hold blood pressure medications.  GERD: Continue pantoprazole  Generalized anxiety disorder: Continue BuSpar and Paxil.  Esophageal dysphagia: Patient is going to have EGD today.   DVT prophylaxis: SCDS Code Status: Full code Family Communication: No family at bed side Disposition Plan:   Status is: Observation The patient remains OBS appropriate and will d/c before 2 midnights.   Admitted for upper GI bleed , undergoing EGD.  Consultants:  Laurette Schimke  Procedures: EGD  Antimicrobials:  Anti-infectives (From admission, onward)    None        Subjective: Patient was seen and examined at bedside.  Overnight events noted. Patient denies any further bleeding episodes, states she had coffee ground emesis on Friday. She is scheduled to have endoscopy today.  Objective: Vitals:   09/04/23 2157 09/05/23 0057 09/05/23 0744 09/05/23 1029  BP:  (!) 124/54 110/63 133/78  Pulse: 98 83 76 80  Resp: 16 17 14 12   Temp:  98.1 F (36.7 C) 97.9 F (36.6 C) 97.7 F (36.5 C)  TempSrc:  Oral Oral Temporal  SpO2: (!) 81% 91% 91% 97%  Weight:      Height:        Intake/Output Summary (Last 24 hours) at 09/05/2023 1103 Last data filed at 09/05/2023 0900 Gross per 24 hour  Intake 0 ml  Output --  Net 0 ml   Filed Weights   09/04/23 1129 09/04/23 2156  Weight: 59.1 kg 62.6 kg    Examination:  General exam: Appears calm and comfortable, not in any acute distress. Respiratory system: Clear to auscultation. Respiratory effort normal.  RR 15 Cardiovascular system: S1 & S2 heard, RRR. No JVD, murmurs, rubs, gallops or clicks. . Gastrointestinal system: Abdomen is non distended, soft and non tender.  Normal bowel sounds heard. Central nervous system: Alert and oriented x 3. No focal neurological deficits. Extremities: No edema, no cyanosis, no clubbing. Skin: No rashes, lesions or ulcers Psychiatry: Judgement and insight appear normal. Mood & affect appropriate.     Data Reviewed: I have personally reviewed following labs and imaging studies  CBC: Recent Labs  Lab 09/03/23 1013 09/03/23 1435 09/04/23 1144 09/04/23 2342 09/05/23 8119  WBC 10.6* 9.6 8.4  --   --   NEUTROABS  --  7.2  --   --   --   HGB 10.0* 10.1* 10.0* 9.4* 9.4*  HCT 32.3* 32.6* 31.3* 28.5* 29.6*  MCV 100.3* 101.2* 98.4  --   --   PLT 287 243 282  --   --    Basic Metabolic Panel: Recent Labs  Lab 09/03/23 1013 09/04/23 1144  NA 134* 133*  K 4.3 3.8  CL 95* 99  CO2 28 25  GLUCOSE 105* 100*  BUN 17 12  CREATININE 1.40* 0.93  CALCIUM 9.1 8.8*    GFR: Estimated Creatinine Clearance: 42.7 mL/min (by C-G formula based on SCr of 0.93 mg/dL). Liver Function Tests: Recent Labs  Lab 09/03/23 1013 09/04/23 1144  AST 34 52*  ALT 18 35  ALKPHOS 77 79  BILITOT 0.7 0.7  PROT 6.5 6.1*  ALBUMIN 3.7 3.5   Recent Labs  Lab 09/03/23 1435 09/04/23 1144  LIPASE 19 18   No results for input(s): "AMMONIA" in the last 168 hours. Coagulation Profile: Recent Labs  Lab 09/04/23 2104  INR 1.1   Cardiac Enzymes: No results for input(s): "CKTOTAL", "CKMB", "CKMBINDEX", "TROPONINI" in the last 168 hours. BNP (last 3 results) No results for input(s): "PROBNP" in the last 8760 hours. HbA1C: No results for input(s): "HGBA1C" in the last 72 hours. CBG: No results for input(s): "GLUCAP" in the last 168 hours. Lipid Profile: No results for input(s): "CHOL", "HDL", "LDLCALC", "TRIG", "CHOLHDL", "LDLDIRECT" in the last 72 hours. Thyroid Function Tests: No results for input(s): "TSH", "T4TOTAL", "FREET4", "T3FREE", "THYROIDAB" in the last 72 hours. Anemia Panel: No results for input(s): "VITAMINB12", "FOLATE", "FERRITIN", "TIBC", "IRON", "RETICCTPCT" in the last 72 hours. Sepsis Labs: No results for input(s): "PROCALCITON", "LATICACIDVEN" in the last 168 hours.  No results found for this or any previous visit (from the past 240 hours).   Radiology Studies: CT Head Wo Contrast Result Date: 09/04/2023 CLINICAL DATA:  Head trauma, minor (Age >= 65y); Neck trauma (Age >= 65y). Dizziness and fall with head strike. EXAM: CT HEAD WITHOUT CONTRAST CT CERVICAL SPINE WITHOUT CONTRAST TECHNIQUE: Multidetector CT imaging of the head and cervical spine was performed following the standard protocol without intravenous contrast. Multiplanar CT image reconstructions of the cervical spine were also generated. RADIATION DOSE REDUCTION: This exam was performed according to the departmental dose-optimization program which includes automated exposure control,  adjustment of the mA and/or kV according to patient size and/or use of iterative reconstruction technique. COMPARISON:  CT head 10/04/2022.  CT cervical spine 10/08/2019. FINDINGS: CT HEAD FINDINGS Brain: There is no evidence of an acute large territory infarct, intracranial hemorrhage, mass, midline shift, or extra-axial fluid collection. There is mild cerebral atrophy. Patchy to confluent hypodensities in the cerebral white matter bilaterally have mildly progressed and are nonspecific but compatible with moderate chronic small vessel ischemic disease. A chronic lacunar infarct is suspected in the left caudate head. Vascular: Calcified atherosclerosis at the skull base. No hyperdense vessel. Skull: No acute fracture or suspicious lesion. Sinuses/Orbits: Visualized paranasal sinuses and mastoid air cells are clear. Bilateral cataract extraction. Other: None. CT CERVICAL SPINE FINDINGS Alignment: Chronic reversal of the normal cervical lordosis with minimal anterolisthesis of C2 on C3 and C3 on C4 and minimal retrolisthesis of C5 on C6. Skull base and vertebrae: No acute fracture or suspicious lesion. Soft tissues and spinal canal: No prevertebral fluid or swelling. No visible canal hematoma. Disc levels: Advanced disc degeneration  from C4-5 through C6-7 with severe disc space narrowing, vacuum disc phenomenon, and degenerative endplate changes. Milder disc degeneration elsewhere in the cervical spine. Potentially moderate spinal stenosis at C5-6. Advanced multilevel neural foraminal stenosis. Upper chest: No mass or consolidation in the included lung apices. Other: Mild-to-moderate atherosclerotic calcification at the carotid bifurcations. IMPRESSION: 1. No evidence of acute intracranial abnormality or cervical spine fracture. 2. Moderate chronic small vessel ischemic disease. Electronically Signed   By: Sebastian Ache M.D.   On: 09/04/2023 15:50   CT Cervical Spine Wo Contrast Result Date: 09/04/2023 CLINICAL  DATA:  Head trauma, minor (Age >= 65y); Neck trauma (Age >= 65y). Dizziness and fall with head strike. EXAM: CT HEAD WITHOUT CONTRAST CT CERVICAL SPINE WITHOUT CONTRAST TECHNIQUE: Multidetector CT imaging of the head and cervical spine was performed following the standard protocol without intravenous contrast. Multiplanar CT image reconstructions of the cervical spine were also generated. RADIATION DOSE REDUCTION: This exam was performed according to the departmental dose-optimization program which includes automated exposure control, adjustment of the mA and/or kV according to patient size and/or use of iterative reconstruction technique. COMPARISON:  CT head 10/04/2022.  CT cervical spine 10/08/2019. FINDINGS: CT HEAD FINDINGS Brain: There is no evidence of an acute large territory infarct, intracranial hemorrhage, mass, midline shift, or extra-axial fluid collection. There is mild cerebral atrophy. Patchy to confluent hypodensities in the cerebral white matter bilaterally have mildly progressed and are nonspecific but compatible with moderate chronic small vessel ischemic disease. A chronic lacunar infarct is suspected in the left caudate head. Vascular: Calcified atherosclerosis at the skull base. No hyperdense vessel. Skull: No acute fracture or suspicious lesion. Sinuses/Orbits: Visualized paranasal sinuses and mastoid air cells are clear. Bilateral cataract extraction. Other: None. CT CERVICAL SPINE FINDINGS Alignment: Chronic reversal of the normal cervical lordosis with minimal anterolisthesis of C2 on C3 and C3 on C4 and minimal retrolisthesis of C5 on C6. Skull base and vertebrae: No acute fracture or suspicious lesion. Soft tissues and spinal canal: No prevertebral fluid or swelling. No visible canal hematoma. Disc levels: Advanced disc degeneration from C4-5 through C6-7 with severe disc space narrowing, vacuum disc phenomenon, and degenerative endplate changes. Milder disc degeneration elsewhere in the  cervical spine. Potentially moderate spinal stenosis at C5-6. Advanced multilevel neural foraminal stenosis. Upper chest: No mass or consolidation in the included lung apices. Other: Mild-to-moderate atherosclerotic calcification at the carotid bifurcations. IMPRESSION: 1. No evidence of acute intracranial abnormality or cervical spine fracture. 2. Moderate chronic small vessel ischemic disease. Electronically Signed   By: Sebastian Ache M.D.   On: 09/04/2023 15:50   DG Chest Portable 1 View Result Date: 09/04/2023 CLINICAL DATA:  Fall EXAM: PORTABLE CHEST 1 VIEW COMPARISON:  09/05/2010 FINDINGS: The heart size and mediastinal contours are within normal limits. Aortic atherosclerosis. Bandlike left basilar opacity, likely atelectasis. No pleural effusion or pneumothorax. The visualized skeletal structures are unremarkable. IMPRESSION: Band-like left basilar opacity, likely atelectasis. Electronically Signed   By: Duanne Guess D.O.   On: 09/04/2023 14:33   DG Wrist Complete Right Result Date: 09/04/2023 CLINICAL DATA:  Right wrist pain and swelling status post fall at home. EXAM: RIGHT WRIST - COMPLETE 3+ VIEW COMPARISON:  None Available. FINDINGS: There is diffuse decreased bone mineralization. No definite acute fracture line is seen. There are two likely chronic, likely corticated ossicles measuring up to 3 mm assess subtalar and styloid. Additional calcification overlying the lateral aspect of the triangular fibrocartilage complex. Mild radiocarpal joint space narrowing. Moderate  thumb carpometacarpal and mild triscaphe joint space narrowing and peripheral osteophytosis. Mild radiocarpal joint space narrowing. Mild degenerative cystic changes in the scaphoid side of the scapholunate articulation. Mild degenerative cystic changes within the distal radial styloid. IMPRESSION: 1. No definite acute fracture is seen. 2. Moderate thumb carpometacarpal and mild triscaphe osteoarthritis. 3. Likely old nonunited  fracture of the ulnar styloid. Electronically Signed   By: Neita Garnet M.D.   On: 09/04/2023 13:58   CT ABDOMEN PELVIS W CONTRAST Result Date: 09/03/2023 CLINICAL DATA:  Acute abdominal pain EXAM: CT ABDOMEN AND PELVIS WITH CONTRAST TECHNIQUE: Multidetector CT imaging of the abdomen and pelvis was performed using the standard protocol following bolus administration of intravenous contrast. RADIATION DOSE REDUCTION: This exam was performed according to the departmental dose-optimization program which includes automated exposure control, adjustment of the mA and/or kV according to patient size and/or use of iterative reconstruction technique. CONTRAST:  75mL OMNIPAQUE IOHEXOL 350 MG/ML SOLN COMPARISON:  04/27/2020 FINDINGS: Lower chest: Descending thoracic aortic atherosclerosis. Mild mitral valve calcification. Dependent atelectasis in both lower lobes with a calcified granuloma in the left lower lobe. Mild cardiomegaly. Small air-level in the distal esophagus, cannot exclude dysmotility. Hepatobiliary: Mild nonspecific periportal edema. Hypodensity in segment 4 adjacent to the falciform ligament likely from focal steatosis. Mildly dilated common bile duct at 0.9 cm in diameter, although stable from 04/27/2020. Pancreas: Unremarkable Spleen: Punctate calcifications from old granulomatous disease. Adrenals/Urinary Tract: Unremarkable Stomach/Bowel: No dilated bowel. Fat deposition in the wall of the terminal ileum cecum can have a weak association with inflammatory bowel disease but is usually incidental. There is also fat deposition in the wall of the appendix which appears otherwise unremarkable. Vascular/Lymphatic: Atherosclerosis is present, including aortoiliac atherosclerotic disease. Atherosclerosis dorsally at the origin of the celiac trunk without critical stenosis. The superior mesenteric artery appears widely patent. Atheromatous vascular calcifications of the origins of the renal arteries. No  pathologic adenopathy. Reproductive: Unremarkable Other: Stable subtle haziness along the mesentery and omentum, no change from 2021, likely incidental. Musculoskeletal: Mild levoconvex lumbar scoliosis along with lumbar spondylosis and degenerative disc disease contributing to multilevel impingement. Grade 1 degenerative retrolisthesis at L3-4 and L4-5 with grade 1 degenerative anterolisthesis at L5-S1. IMPRESSION: 1. No specific abnormality is identified to explain the patient's abdominal pain. 2. Mild cardiomegaly. 3. Small air-level in the distal esophagus, cannot exclude dysmotility. 4. Mildly dilated common bile duct at 0.9 cm in diameter, although stable from 04/27/2020. 5. Mild periportal edema, as can be seen in a variety of conditions including congestive heart failure, inflammation, following trauma, poor lymphatic drainage, and in a variety of other conditions. 6. Fat deposition in the wall of the terminal ileum and cecum can have a weak association with inflammatory bowel disease but is usually incidental. 7. Lumbar spondylosis and degenerative disc disease contributing to multilevel impingement. 8.  Aortic Atherosclerosis (ICD10-I70.0). Electronically Signed   By: Gaylyn Rong M.D.   On: 09/03/2023 14:12   Scheduled Meds:  [MAR Hold] alum & mag hydroxide-simeth  30 mL Oral Once   [MAR Hold] atorvastatin  10 mg Oral Daily   [MAR Hold] nortriptyline  150 mg Oral QHS   [MAR Hold] pantoprazole (PROTONIX) IV  40 mg Intravenous Q12H   [MAR Hold] PARoxetine  40 mg Oral Daily   [MAR Hold] sodium chloride flush  3-10 mL Intravenous Q12H   Continuous Infusions:   LOS: 0 days    Time spent: 50 mins    Willeen Niece, MD [facility]  If 7PM-7AM, please contact night-coverage

## 2023-09-05 NOTE — Progress Notes (Signed)
 [facility] night cross cover note:  Per pt request, I have resumed the patient's home evening nortriptyline.    Newton Pigg, DO Hospitalist

## 2023-09-05 NOTE — Anesthesia Preprocedure Evaluation (Addendum)
 Anesthesia Evaluation  Patient identified by MRN, date of birth, ID band Patient awake    Reviewed: Allergy & Precautions, NPO status , Patient's Chart, lab work & pertinent test results  Airway Mallampati: II  TM Distance: >3 FB Neck ROM: Full    Dental  (+) Dental Advisory Given, Edentulous Upper, Edentulous Lower   Pulmonary neg pulmonary ROS   breath sounds clear to auscultation       Cardiovascular hypertension, Pt. on medications + Valvular Problems/Murmurs  Rhythm:Regular Rate:Normal     Neuro/Psych  PSYCHIATRIC DISORDERS Anxiety Depression     Neuromuscular disease    GI/Hepatic Neg liver ROS, hiatal hernia,GERD  Medicated,,  Endo/Other  negative endocrine ROS    Renal/GU negative Renal ROS     Musculoskeletal  (+) Arthritis ,    Abdominal   Peds  Hematology  (+) Blood dyscrasia, anemia   Anesthesia Other Findings   Reproductive/Obstetrics                             Anesthesia Physical Anesthesia Plan  ASA: 3  Anesthesia Plan: MAC   Post-op Pain Management: Minimal or no pain anticipated   Induction: Intravenous  PONV Risk Score and Plan: 0  Airway Management Planned: Nasal Cannula and Natural Airway  Additional Equipment: None  Intra-op Plan:   Post-operative Plan:   Informed Consent: I have reviewed the patients History and Physical, chart, labs and discussed the procedure including the risks, benefits and alternatives for the proposed anesthesia with the patient or authorized representative who has indicated his/her understanding and acceptance.       Plan Discussed with: CRNA  Anesthesia Plan Comments:        Anesthesia Quick Evaluation

## 2023-09-05 NOTE — Progress Notes (Signed)
 [facility] night cross cover note:   Per patient request I have resumed her outpatient prn Robaxin.  Newton Pigg, DO Hospitalist

## 2023-09-05 NOTE — Interval H&P Note (Signed)
 History and Physical Interval Note:  09/05/2023 11:12 AM  Tiffany [name]  has presented today for surgery, with the diagnosis of dysphagia, coffee ground emesis.  The various methods of treatment have been discussed with the patient and family. After consideration of risks, benefits and other options for treatment, the patient has consented to  Procedure(s): EGD (ESOPHAGOGASTRODUODENOSCOPY) (N/A) as a surgical intervention.  The patient's history has been reviewed, patient examined, no change in status, stable for surgery.  I have reviewed the patient's chart and labs.  Questions were answered to the patient's satisfaction.     Verlin Dike Ortha Metts

## 2023-09-05 NOTE — TOC CM/SW Note (Signed)
 Transition of Care [facility]) - Inpatient Brief Assessment   Patient Details  Name: Tiffany [name] MRN: [id number] Date of Birth: [birth year]  [facility] of [facility]) CM/SW Contact:    Leone Haven, RN Phone Number: 09/05/2023, 1:47 PM   Clinical Narrative: From home alone, has PCP , Dr. Ermalene Searing and insurance on file, states has no HH services in place at this time, has walker, cane at home, but do not use them.  States daughter [name] will  transport her home at dc and she gives [place] permission to speak with her daughter.   Daughter is support system.Sandria Bales self ambulatory.   Transition of Care Asessment: Insurance and Status: Insurance coverage has been reviewed Patient has primary care physician: Yes (Dr. Ermalene Searing) Home environment has been reviewed: home alone Prior level of function:: indep Prior/Current Home Services: Current home services (walker, cane, - don't use) Social Drivers of Health Review: SDOH reviewed no interventions necessary Readmission risk has been reviewed: Yes Transition of care needs: no transition of care needs at this time

## 2023-09-06 ENCOUNTER — Other Ambulatory Visit (HOSPITAL_COMMUNITY): Payer: Self-pay

## 2023-09-06 ENCOUNTER — Encounter: Payer: Self-pay | Admitting: Gastroenterology

## 2023-09-06 DIAGNOSIS — S60219A Contusion of unspecified wrist, initial encounter: Secondary | ICD-10-CM | POA: Diagnosis not present

## 2023-09-06 DIAGNOSIS — R1110 Vomiting, unspecified: Secondary | ICD-10-CM

## 2023-09-06 DIAGNOSIS — K920 Hematemesis: Secondary | ICD-10-CM | POA: Diagnosis not present

## 2023-09-06 LAB — CBC
HCT: 31.5 % — ABNORMAL LOW (ref 36.0–46.0)
Hemoglobin: 10.1 g/dL — ABNORMAL LOW (ref 12.0–15.0)
MCH: 31.8 pg (ref 26.0–34.0)
MCHC: 32.1 g/dL (ref 30.0–36.0)
MCV: 99.1 fL (ref 80.0–100.0)
Platelets: 305 10*3/uL (ref 150–400)
RBC: 3.18 MIL/uL — ABNORMAL LOW (ref 3.87–5.11)
RDW: 13.6 % (ref 11.5–15.5)
WBC: 6.4 10*3/uL (ref 4.0–10.5)
nRBC: 0 % (ref 0.0–0.2)

## 2023-09-06 LAB — SURGICAL PATHOLOGY

## 2023-09-06 LAB — HEMOGLOBIN AND HEMATOCRIT, BLOOD
HCT: 28 % — ABNORMAL LOW (ref 36.0–46.0)
Hemoglobin: 9.2 g/dL — ABNORMAL LOW (ref 12.0–15.0)

## 2023-09-06 MED ORDER — PANTOPRAZOLE SODIUM 40 MG PO TBEC
40.0000 mg | DELAYED_RELEASE_TABLET | Freq: Two times a day (BID) | ORAL | 4 refills | Status: DC
  Filled 2023-09-06 – 2023-09-07 (×2): qty 90, 45d supply, fill #0
  Filled 2023-12-12: qty 90, 90d supply, fill #1
  Filled 2023-12-29: qty 90, 45d supply, fill #1

## 2023-09-06 MED ORDER — ONDANSETRON 4 MG PO TBDP
4.0000 mg | ORAL_TABLET | Freq: Three times a day (TID) | ORAL | 0 refills | Status: DC | PRN
  Filled 2023-09-07: qty 20, 7d supply, fill #0

## 2023-09-06 NOTE — Plan of Care (Signed)

## 2023-09-06 NOTE — Progress Notes (Signed)
 SATURATION QUALIFICATIONS: (This note is used to comply with regulatory documentation for home oxygen)  Patient Saturations on Room Air at Rest = 88%  Patient Saturations on Room Air while Ambulating = n/a%  Patient Saturations on 2.5 Liters of oxygen while Ambulating = 94%  Please briefly explain why patient needs home oxygen:

## 2023-09-06 NOTE — TOC Progression Note (Addendum)
 Transition of Care [facility]) - Progression Note    Patient Details  Name: Tiffany [name] MRN: [id number] Date of Birth: [birth year]  [facility]) CM/SW Contact  Leone Haven, RN Phone Number: 09/06/2023, 9:59 AM  Clinical Narrative:    NCM offered choice for home oxygen, she states she has no preference,  NCM made referral to [facility] with [employer].  Oxygen tank will be brought up to room. Patient states she does not need oxygen,  NT checked her room air at rest again and it was 96, this NCM informed MD of this information that she was 96 and will not need oxygen.          Expected Discharge Plan and Services         Expected Discharge Date: 09/06/23                                     Social Determinants of Health (SDOH) Interventions SDOH Screenings   Food Insecurity: No Food Insecurity (09/04/2023)  Housing: Low Risk  (09/04/2023)  Transportation Needs: No Transportation Needs (09/04/2023)  Utilities: Not At Risk (09/04/2023)  Alcohol Screen: Low Risk  (10/07/2021)  Depression (PHQ2-9): Low Risk  (05/16/2023)  Financial Resource Strain: Low Risk  (10/07/2021)  Physical Activity: Inactive (10/07/2021)  Social Connections: Moderately Integrated (09/04/2023)  Stress: No Stress Concern Present (10/07/2021)  Tobacco Use: Low Risk  (09/05/2023)    Readmission Risk Interventions     No data to display

## 2023-09-06 NOTE — Plan of Care (Signed)
   Problem: Health Behavior/Discharge Planning: Goal: Ability to manage health-related needs will improve Outcome: Progressing   Problem: Clinical Measurements: Goal: Ability to maintain clinical measurements within normal limits will improve Outcome: Progressing

## 2023-09-06 NOTE — Discharge Summary (Addendum)
 Physician Discharge Summary   Patient: Tiffany [name] MRN: [id number] DOB: 01/11/1946  Admit date:     09/04/2023  Discharge date: 09/06/23  Discharge Physician: Thad Ranger, MD    PCP: [name], MD   Recommendations at discharge:   Continue Protonix 40 mg twice daily for 6 weeks then continue Protonix 40 mg daily Will need repeat EGD in 6 to 8 weeks to check healing, follow-up outpatient with GI Repeat CBC in 7 to 10 days after hospital discharge  Discharge Diagnoses:    Hematemesis/upper GI bleed secondary to gastritis Benign esophageal stricture status post dilation Acute respiratory failure with hypoxia   Essential hypertension, benign   GERD   GAD (generalized anxiety disorder)   ABLA (acute blood loss anemia)   Esophageal dysphagia    Hospital Course:  Patient is a 78 years old female with PMH significant for hypertension, GERD, arthritis presented in the ED with nausea, vomiting and coffee-ground emesis in the last few days.  Patient presented in the ED yesterday with similar presentation,  stool for occult blood was negative, CTA was negative.  Patient was discharged home.  Patient reports fall at home, she hit the head but denies any loss of consciousness.  Skeletal workup unremarkable in the ED.  H&H remains stable.  CT Abd. shows no specific abnormality.   Admitted for further workup.   Assessment and Plan:   Hemetemesis/upper GI bleed secondary to gastritis: - Patient presented with nausea , vomiting and coffee-ground emesis. -H&H remained stable, patient was placed on IV Protonix  -GI was consulted.  Underwent EGD which showed LA grade B esophagitis with no bleeding benign-appearing mild stenosis in the lower third of this because measured 1 cm status post dilation.  Hiatal hernia patchy mild inflammation characterized by congestion/edema and erythema the gastric fundus, in the gastric body and in the gastric antrum, single evaluation in the distal  gastric body. -No further hematemesis, hemoglobin stable 10.1 at discharge -Per GI, discharge Protonix 40 mg twice daily for 6 weeks then continue daily indefinitely.  Need repeat EGD in 6 to 8 weeks to ensure healing.  Outpatient follow-up with GI 4 weeks.    Essential hypertension: BP now stable, outpatient antihypertensives   GERD: Continue pantoprazole   Generalized anxiety disorder: Continue BuSpar and Paxil.   Esophageal dysphagia secondary to benign esophageal stricture: -Esophageal stricture dilated during EGD Now tolerating soft diet  Acute respiratory failure with hypoxia -Home O2 evaluation done, patient qualified for 2.5 L of O2 while ambulating. -Will need repeat home O2 evaluation at the follow-up appointment with PCP for ambulatory sats. -Negative balance of 837 cc, chest x-ray on admission showed atelectasis but no pleural effusion or pneumothorax or consolidation. Repeat home O2 checked and patient sats 96% on room air, she declined O2.     Pain control - [employer] Controlled Substance Reporting System database was reviewed. and patient was instructed, not to drive, operate heavy machinery, perform activities at heights, swimming or participation in water activities or provide baby-sitting services while on Pain, Sleep and Anxiety Medications; until their outpatient Physician has advised to do so again. Also recommended to not to take more than prescribed Pain, Sleep and Anxiety Medications.  Consultants: Gastroenterology Procedures performed: EGD Disposition: Home Diet recommendation: Soft diet  DISCHARGE MEDICATION: Allergies as of 09/06/2023       Reactions   Aspirin Swelling   Latex Anaphylaxis, Hives   Zolpidem Tartrate Other (See Comments)    unknown  Allegra [fexofenadine] Nausea And Vomiting   Nsaids Swelling   Tramadol Itching        Medication List     TAKE these medications    amLODipine 2.5 MG tablet Commonly known as:  NORVASC Take 1 tablet (2.5 mg total) by mouth daily. FOR RAYNAUDS   atorvastatin 10 MG tablet Commonly known as: LIPITOR Take 1 tablet (10 mg total) by mouth daily.   busPIRone 5 MG tablet Commonly known as: BUSPAR Take 1 tablet (5 mg total) by mouth 3 (three) times daily as needed.   cyanocobalamin 1000 MCG tablet Commonly known as: VITAMIN B12 Take 1,000 mcg by mouth daily.   HYDROcodone-acetaminophen 10-325 MG tablet Commonly known as: NORCO Take 1 tablet by mouth 4 (four) times daily as needed for pain.   methocarbamol 500 MG tablet Commonly known as: ROBAXIN Take 1 tablet (500 mg total) by mouth 4 (four) times daily as needed.   nortriptyline 50 MG capsule Commonly known as: PAMELOR Take 3 capsules (150 mg total) by mouth at bedtime.   ondansetron 4 MG disintegrating tablet Commonly known as: ZOFRAN-ODT Take 1 tablet (4 mg total) by mouth every 8 (eight) hours as needed for nausea or vomiting.   pantoprazole 40 MG tablet Commonly known as: PROTONIX Take 1 tablet (40 mg total) by mouth 2 (two) times daily before a meal. Take pantoprazole (Protonix) 1 tablet (40 mg) twice a day for 6 weeks, then 1 tablet (40 mg) once daily indefinitely What changed:  when to take this additional instructions   PARoxetine 40 MG tablet Commonly known as: Paxil Take 1/2-1 tablet (20-40 mg total) by mouth daily as directed   sennosides-docusate sodium 8.6-50 MG tablet Commonly known as: SENOKOT-S Take 2 tablets by mouth daily.   VITAMIN D3 PO Take 1 tablet by mouth 3 (three) times a week.               Durable Medical Equipment  (From admission, onward)           Start     Ordered   09/06/23 0941  For home use only DME oxygen  Once       Question Answer Comment  Length of Need 12 Months   Mode or (Route) Nasal cannula   Liters per Minute 2.5   Frequency Continuous (stationary and portable oxygen unit needed)   Oxygen conserving device Yes   Oxygen delivery system  Gas      09/06/23 0941            Follow-up Information     [name], MD. Schedule an appointment as soon as possible for a visit in 10 day(s).   Specialty: Family Medicine Why: for hospital follow-up, obtain labs CBC Contact information: [address] [place] [address] [phone]         Shellia Cleverly, DO. Schedule an appointment as soon as possible for a visit in 4 week(s).   Specialty: Gastroenterology Why: for hospital follow-up Contact information: [address] [place] [address] [phone]                Discharge Exam: Ceasar Mons Weights   09/04/23 1129 09/04/23 2156 09/06/23 0518  Weight: 59.1 kg 62.6 kg 62.2 kg   S: No acute nausea vomiting, chest pain or shortness of breath.  No fevers.  Still has some sore throat from the EGD stable.  Wants to go home today.  BP (!) 124/58 (BP Location: Right Arm)  Pulse 89   Temp 98.4 F (36.9 C) (Oral)   Resp 17   Ht 5\' 2"  (1.575 m)   Wt 62.2 kg   SpO2 94%   BMI 25.09 kg/m   Physical Exam General: Alert and oriented x 3, NAD Cardiovascular: S1 S2 clear, RRR.  Respiratory: CTAB, no wheezing, rales or rhonchi Gastrointestinal: Soft, nontender, nondistended, NBS Ext: no pedal edema bilaterally Neuro: no new deficits Psych: Normal affect    Condition at discharge: fair  The results of significant diagnostics from this hospitalization (including imaging, microbiology, ancillary and laboratory) are listed below for reference.   Imaging Studies: CT Head Wo Contrast Result Date: 09/04/2023 CLINICAL DATA:  Head trauma, minor (Age >= 65y); Neck trauma (Age >= 65y). Dizziness and fall with head strike. EXAM: CT HEAD WITHOUT CONTRAST CT CERVICAL SPINE WITHOUT CONTRAST TECHNIQUE: Multidetector CT imaging of the head and cervical spine was performed following the standard protocol without intravenous contrast. Multiplanar CT image reconstructions of the cervical spine were also  generated. RADIATION DOSE REDUCTION: This exam was performed according to the departmental dose-optimization program which includes automated exposure control, adjustment of the mA and/or kV according to patient size and/or use of iterative reconstruction technique. COMPARISON:  CT head 10/04/2022.  CT cervical spine 10/08/2019. FINDINGS: CT HEAD FINDINGS Brain: There is no evidence of an acute large territory infarct, intracranial hemorrhage, mass, midline shift, or extra-axial fluid collection. There is mild cerebral atrophy. Patchy to confluent hypodensities in the cerebral white matter bilaterally have mildly progressed and are nonspecific but compatible with moderate chronic small vessel ischemic disease. A chronic lacunar infarct is suspected in the left caudate head. Vascular: Calcified atherosclerosis at the skull base. No hyperdense vessel. Skull: No acute fracture or suspicious lesion. Sinuses/Orbits: Visualized paranasal sinuses and mastoid air cells are clear. Bilateral cataract extraction. Other: None. CT CERVICAL SPINE FINDINGS Alignment: Chronic reversal of the normal cervical lordosis with minimal anterolisthesis of C2 on C3 and C3 on C4 and minimal retrolisthesis of C5 on C6. Skull base and vertebrae: No acute fracture or suspicious lesion. Soft tissues and spinal canal: No prevertebral fluid or swelling. No visible canal hematoma. Disc levels: Advanced disc degeneration from C4-5 through C6-7 with severe disc space narrowing, vacuum disc phenomenon, and degenerative endplate changes. Milder disc degeneration elsewhere in the cervical spine. Potentially moderate spinal stenosis at C5-6. Advanced multilevel neural foraminal stenosis. Upper chest: No mass or consolidation in the included lung apices. Other: Mild-to-moderate atherosclerotic calcification at the carotid bifurcations. IMPRESSION: 1. No evidence of acute intracranial abnormality or cervical spine fracture. 2. Moderate chronic small vessel  ischemic disease. Electronically Signed   By: Sebastian Ache M.D.   On: 09/04/2023 15:50   CT Cervical Spine Wo Contrast Result Date: 09/04/2023 CLINICAL DATA:  Head trauma, minor (Age >= 65y); Neck trauma (Age >= 65y). Dizziness and fall with head strike. EXAM: CT HEAD WITHOUT CONTRAST CT CERVICAL SPINE WITHOUT CONTRAST TECHNIQUE: Multidetector CT imaging of the head and cervical spine was performed following the standard protocol without intravenous contrast. Multiplanar CT image reconstructions of the cervical spine were also generated. RADIATION DOSE REDUCTION: This exam was performed according to the departmental dose-optimization program which includes automated exposure control, adjustment of the mA and/or kV according to patient size and/or use of iterative reconstruction technique. COMPARISON:  CT head 10/04/2022.  CT cervical spine 10/08/2019. FINDINGS: CT HEAD FINDINGS Brain: There is no evidence of an acute large territory infarct, intracranial hemorrhage, mass, midline shift, or extra-axial fluid  collection. There is mild cerebral atrophy. Patchy to confluent hypodensities in the cerebral white matter bilaterally have mildly progressed and are nonspecific but compatible with moderate chronic small vessel ischemic disease. A chronic lacunar infarct is suspected in the left caudate head. Vascular: Calcified atherosclerosis at the skull base. No hyperdense vessel. Skull: No acute fracture or suspicious lesion. Sinuses/Orbits: Visualized paranasal sinuses and mastoid air cells are clear. Bilateral cataract extraction. Other: None. CT CERVICAL SPINE FINDINGS Alignment: Chronic reversal of the normal cervical lordosis with minimal anterolisthesis of C2 on C3 and C3 on C4 and minimal retrolisthesis of C5 on C6. Skull base and vertebrae: No acute fracture or suspicious lesion. Soft tissues and spinal canal: No prevertebral fluid or swelling. No visible canal hematoma. Disc levels: Advanced disc degeneration  from C4-5 through C6-7 with severe disc space narrowing, vacuum disc phenomenon, and degenerative endplate changes. Milder disc degeneration elsewhere in the cervical spine. Potentially moderate spinal stenosis at C5-6. Advanced multilevel neural foraminal stenosis. Upper chest: No mass or consolidation in the included lung apices. Other: Mild-to-moderate atherosclerotic calcification at the carotid bifurcations. IMPRESSION: 1. No evidence of acute intracranial abnormality or cervical spine fracture. 2. Moderate chronic small vessel ischemic disease. Electronically Signed   By: Sebastian Ache M.D.   On: 09/04/2023 15:50   DG Chest Portable 1 View Result Date: 09/04/2023 CLINICAL DATA:  Fall EXAM: PORTABLE CHEST 1 VIEW COMPARISON:  09/05/2010 FINDINGS: The heart size and mediastinal contours are within normal limits. Aortic atherosclerosis. Bandlike left basilar opacity, likely atelectasis. No pleural effusion or pneumothorax. The visualized skeletal structures are unremarkable. IMPRESSION: Band-like left basilar opacity, likely atelectasis. Electronically Signed   By: Duanne Guess D.O.   On: 09/04/2023 14:33   DG Wrist Complete Right Result Date: 09/04/2023 CLINICAL DATA:  Right wrist pain and swelling status post fall at home. EXAM: RIGHT WRIST - COMPLETE 3+ VIEW COMPARISON:  None Available. FINDINGS: There is diffuse decreased bone mineralization. No definite acute fracture line is seen. There are two likely chronic, likely corticated ossicles measuring up to 3 mm assess subtalar and styloid. Additional calcification overlying the lateral aspect of the triangular fibrocartilage complex. Mild radiocarpal joint space narrowing. Moderate thumb carpometacarpal and mild triscaphe joint space narrowing and peripheral osteophytosis. Mild radiocarpal joint space narrowing. Mild degenerative cystic changes in the scaphoid side of the scapholunate articulation. Mild degenerative cystic changes within the distal  radial styloid. IMPRESSION: 1. No definite acute fracture is seen. 2. Moderate thumb carpometacarpal and mild triscaphe osteoarthritis. 3. Likely old nonunited fracture of the ulnar styloid. Electronically Signed   By: Neita Garnet M.D.   On: 09/04/2023 13:58   CT ABDOMEN PELVIS W CONTRAST Result Date: 09/03/2023 CLINICAL DATA:  Acute abdominal pain EXAM: CT ABDOMEN AND PELVIS WITH CONTRAST TECHNIQUE: Multidetector CT imaging of the abdomen and pelvis was performed using the standard protocol following bolus administration of intravenous contrast. RADIATION DOSE REDUCTION: This exam was performed according to the departmental dose-optimization program which includes automated exposure control, adjustment of the mA and/or kV according to patient size and/or use of iterative reconstruction technique. CONTRAST:  75mL OMNIPAQUE IOHEXOL 350 MG/ML SOLN COMPARISON:  04/27/2020 FINDINGS: Lower chest: Descending thoracic aortic atherosclerosis. Mild mitral valve calcification. Dependent atelectasis in both lower lobes with a calcified granuloma in the left lower lobe. Mild cardiomegaly. Small air-level in the distal esophagus, cannot exclude dysmotility. Hepatobiliary: Mild nonspecific periportal edema. Hypodensity in segment 4 adjacent to the falciform ligament likely from focal steatosis. Mildly dilated common bile  duct at 0.9 cm in diameter, although stable from 04/27/2020. Pancreas: Unremarkable Spleen: Punctate calcifications from old granulomatous disease. Adrenals/Urinary Tract: Unremarkable Stomach/Bowel: No dilated bowel. Fat deposition in the wall of the terminal ileum cecum can have a weak association with inflammatory bowel disease but is usually incidental. There is also fat deposition in the wall of the appendix which appears otherwise unremarkable. Vascular/Lymphatic: Atherosclerosis is present, including aortoiliac atherosclerotic disease. Atherosclerosis dorsally at the origin of the celiac trunk without  critical stenosis. The superior mesenteric artery appears widely patent. Atheromatous vascular calcifications of the origins of the renal arteries. No pathologic adenopathy. Reproductive: Unremarkable Other: Stable subtle haziness along the mesentery and omentum, no change from 2021, likely incidental. Musculoskeletal: Mild levoconvex lumbar scoliosis along with lumbar spondylosis and degenerative disc disease contributing to multilevel impingement. Grade 1 degenerative retrolisthesis at L3-4 and L4-5 with grade 1 degenerative anterolisthesis at L5-S1. IMPRESSION: 1. No specific abnormality is identified to explain the patient's abdominal pain. 2. Mild cardiomegaly. 3. Small air-level in the distal esophagus, cannot exclude dysmotility. 4. Mildly dilated common bile duct at 0.9 cm in diameter, although stable from 04/27/2020. 5. Mild periportal edema, as can be seen in a variety of conditions including congestive heart failure, inflammation, following trauma, poor lymphatic drainage, and in a variety of other conditions. 6. Fat deposition in the wall of the terminal ileum and cecum can have a weak association with inflammatory bowel disease but is usually incidental. 7. Lumbar spondylosis and degenerative disc disease contributing to multilevel impingement. 8.  Aortic Atherosclerosis (ICD10-I70.0). Electronically Signed   By: Gaylyn Rong M.D.   On: 09/03/2023 14:12    Microbiology: Results for orders placed or performed in visit on 03/08/23  Urine Culture     Status: Abnormal   Collection Time: 03/08/23 12:10 PM   Specimen: Urine  Result Value Ref Range Status   MICRO NUMBER: [id number]  Final   SPECIMEN QUALITY: Adequate  Final   Sample Source URINE  Final   STATUS: FINAL  Final   ISOLATE 1: Escherichia coli (A)  Final    Comment: Greater than 100,000 CFU/mL of Escherichia coli      Susceptibility   Escherichia coli - URINE CULTURE, REFLEX    AMOX/CLAVULANIC <=2 Sensitive     AMPICILLIN 4  Sensitive     AMPICILLIN/SULBACTAM <=2 Sensitive     CEFAZOLIN* <=4 Not Reportable      * For infections other than uncomplicated UTI caused by E. coli, K. pneumoniae or P. mirabilis: Cefazolin is resistant if MIC > or = 8 mcg/mL. (Distinguishing susceptible versus intermediate for isolates with MIC < or = 4 mcg/mL requires additional testing.) For uncomplicated UTI caused by E. coli, K. pneumoniae or P. mirabilis: Cefazolin is susceptible if MIC <32 mcg/mL and predicts susceptible to the oral agents cefaclor, cefdinir, cefpodoxime, cefprozil, cefuroxime, cephalexin and loracarbef.     CEFTAZIDIME <=1 Sensitive     CEFEPIME <=1 Sensitive     CEFTRIAXONE <=1 Sensitive     CIPROFLOXACIN <=0.25 Sensitive     LEVOFLOXACIN <=0.12 Sensitive     GENTAMICIN <=1 Sensitive     IMIPENEM <=0.25 Sensitive     NITROFURANTOIN <=16 Sensitive     PIP/TAZO <=4 Sensitive     TOBRAMYCIN <=1 Sensitive     TRIMETH/SULFA* <=20 Sensitive      * For infections other than uncomplicated UTI caused by E. coli, K. pneumoniae or P. mirabilis: Cefazolin is resistant if MIC > or = 8 mcg/mL. (Distinguishing  susceptible versus intermediate for isolates with MIC < or = 4 mcg/mL requires additional testing.) For uncomplicated UTI caused by E. coli, K. pneumoniae or P. mirabilis: Cefazolin is susceptible if MIC <32 mcg/mL and predicts susceptible to the oral agents cefaclor, cefdinir, cefpodoxime, cefprozil, cefuroxime, cephalexin and loracarbef. Legend: S = Susceptible  I = Intermediate R = Resistant  NS = Not susceptible SDD = Susceptible Dose Dependent * = Not Tested  NR = Not Reported **NN = See Therapy Comments     Labs: CBC: Recent Labs  Lab 09/03/23 1013 09/03/23 1435 09/04/23 1144 09/04/23 2342 09/05/23 0728 09/05/23 1638 09/05/23 2106 09/06/23 0039  WBC 10.6* 9.6 8.4  --   --   --   --   --   NEUTROABS  --  7.2  --   --   --   --   --   --   HGB 10.0* 10.1* 10.0* 9.4* 9.4* 9.2* 9.4*  9.2*  HCT 32.3* 32.6* 31.3* 28.5* 29.6* 28.3* 28.9* 28.0*  MCV 100.3* 101.2* 98.4  --   --   --   --   --   PLT 287 243 282  --   --   --   --   --    Basic Metabolic Panel: Recent Labs  Lab 09/03/23 1013 09/04/23 1144  NA 134* 133*  K 4.3 3.8  CL 95* 99  CO2 28 25  GLUCOSE 105* 100*  BUN 17 12  CREATININE 1.40* 0.93  CALCIUM 9.1 8.8*   Liver Function Tests: Recent Labs  Lab 09/03/23 1013 09/04/23 1144  AST 34 52*  ALT 18 35  ALKPHOS 77 79  BILITOT 0.7 0.7  PROT 6.5 6.1*  ALBUMIN 3.7 3.5   CBG: No results for input(s): "GLUCAP" in the last 168 hours.  Discharge time spent: greater than 30 minutes.  Signed: Thad Ranger, MD [facility] 09/06/2023

## 2023-09-06 NOTE — Plan of Care (Signed)

## 2023-09-07 ENCOUNTER — Encounter: Payer: Self-pay | Admitting: Family Medicine

## 2023-09-07 ENCOUNTER — Other Ambulatory Visit (HOSPITAL_COMMUNITY): Payer: Self-pay

## 2023-09-07 ENCOUNTER — Ambulatory Visit (INDEPENDENT_AMBULATORY_CARE_PROVIDER_SITE_OTHER): Admitting: Family Medicine

## 2023-09-07 VITALS — BP 140/80 | HR 94 | Temp 97.8°F | Ht 62.75 in | Wt 137.5 lb

## 2023-09-07 DIAGNOSIS — R609 Edema, unspecified: Secondary | ICD-10-CM | POA: Diagnosis not present

## 2023-09-07 DIAGNOSIS — I10 Essential (primary) hypertension: Secondary | ICD-10-CM | POA: Diagnosis not present

## 2023-09-07 DIAGNOSIS — D649 Anemia, unspecified: Secondary | ICD-10-CM | POA: Diagnosis not present

## 2023-09-07 DIAGNOSIS — K2901 Acute gastritis with bleeding: Secondary | ICD-10-CM

## 2023-09-07 MED ORDER — FUROSEMIDE 20 MG PO TABS
20.0000 mg | ORAL_TABLET | Freq: Every day | ORAL | 0 refills | Status: DC
Start: 2023-09-07 — End: 2023-09-14
  Filled 2023-09-07: qty 3, 3d supply, fill #0

## 2023-09-07 NOTE — Patient Instructions (Addendum)
 Per GI, discharge Protonix  40 mg twice daily for 6 weeks then continue daily indefinitely.  Need repeat EGD in 6 to 8 weeks to ensure healing.  Outpatient follow-up with GI 4 weeks.  Take furosemide  for fluid x 3 days.. call if swelling not improving.  If severe shortness of breath or oral swelling.Aaron Aas go to ER.

## 2023-09-07 NOTE — Progress Notes (Signed)
 Patient ID: Tiffany [name], female    DOB: 1945/10/11, 78 y.o.   MRN: [id number]  This visit was conducted in person.  BP (!) 140/80 (BP Location: Left Arm, Patient Position: Sitting, Cuff Size: Normal)   Pulse 94   Temp 97.8 F (36.6 C) (Temporal)   Ht 5' 2.75" (1.594 m)   Wt 137 lb 8 oz (62.4 kg)   SpO2 97%   BMI 24.55 kg/m    CC:  Chief Complaint  Patient presents with   Hospitalization Follow-up   Edema    Subjective:   HPI: Tiffany [name] is a 78 y.o. female presenting on 09/07/2023 for Hospitalization Follow-up and Edema  Summary of recent discharge note provided below for informational purposes: Recommendations at discharge:    Continue Protonix  40 mg twice daily for 6 weeks then continue Protonix  40 mg daily Will need repeat EGD in 6 to 8 weeks to check healing, follow-up outpatient with GI Repeat CBC in 7 to 10 days after hospital discharge   Discharge Diagnoses:     Hematemesis/upper GI bleed secondary to gastritis Benign esophageal stricture status post dilation Acute respiratory failure with hypoxia   Essential hypertension, benign   GERD   GAD (generalized anxiety disorder)   ABLA (acute blood loss anemia)   Esophageal dysphagia       ON 3/24.2025: presented in the ED with nausea, vomiting and coffee-ground emesis.  Patient presented in the ED yesterday with similar presentation,  stool for occult blood was negative, CTA was negative.  Patient was discharged home.  Patient reports fall at home, she hit the head but denies any loss of consciousness.  Skeletal workup unremarkable in the ED.  H&H remains stable.  CT Abd. shows no specific abnormality.   Admitted for further workup.   Assessment and Plan: Hemetemesis/upper GI bleed secondary to gastritis: - Patient presented with nausea , vomiting and coffee-ground emesis. -H&H remained stable, patient was placed on IV Protonix   -GI was consulted.  Underwent EGD which showed LA grade B  esophagitis with no bleeding benign-appearing mild stenosis in the lower third of this because measured 1 cm status post dilation.  Hiatal hernia patchy mild inflammation characterized by congestion/edema and erythema the gastric fundus, in the gastric body and in the gastric antrum, single evaluation in the distal gastric body. -No further hematemesis, hemoglobin stable 10.1 at discharge -Per GI, discharge Protonix  40 mg twice daily for 6 weeks then continue daily indefinitely.  Need repeat EGD in 6 to 8 weeks to ensure healing.  Outpatient follow-up with GI 4 weeks.   Today she reports: She is feeling much better overall.  No vomiting... no abd pain. Still feeling weak and dizzy.  No dark stool.  Regular BMs.   She has noted swelling in hands feet and face... not so much in ankles.  No trouble breath.  No tongue swelling.  No rash.     Relevant past medical, surgical, family and social history reviewed and updated as indicated. Interim medical history since our last visit reviewed. Allergies and medications reviewed and updated. Outpatient Medications Prior to Visit  Medication Sig Dispense Refill   amLODipine  (NORVASC ) 2.5 MG tablet Take 1 tablet (2.5 mg total) by mouth daily. FOR RAYNAUDS 30 tablet 11   atorvastatin  (LIPITOR) 10 MG tablet Take 1 tablet (10 mg total) by mouth daily. 30 tablet 6   busPIRone  (BUSPAR ) 5 MG tablet Take 1 tablet (5 mg total) by mouth 3 (three) times daily  as needed. 90 tablet 1   Cholecalciferol (VITAMIN D3 PO) Take 1 tablet by mouth 3 (three) times a week.     HYDROcodone -acetaminophen  (NORCO) 10-325 MG tablet Take 1 tablet by mouth 4 (four) times daily as needed for pain. 120 tablet 0   methocarbamol  (ROBAXIN ) 500 MG tablet Take 1 tablet (500 mg total) by mouth 4 (four) times daily as needed. 120 tablet 2   nortriptyline  (PAMELOR ) 50 MG capsule Take 3 capsules (150 mg total) by mouth at bedtime. 270 capsule 1   ondansetron  (ZOFRAN -ODT) 4 MG  disintegrating tablet Take 1 tablet (4 mg total) by mouth every 8 (eight) hours as needed for nausea or vomiting. (Patient not taking: Reported on 09/26/2023) 20 tablet 0   pantoprazole  (PROTONIX ) 40 MG tablet Take 1 tablet (40 mg total) by mouth 2 (two) times daily before a meal. Take pantoprazole  (Protonix ) 1 tablet (40 mg) twice a day for 6 weeks, then 1 tablet (40 mg) once daily indefinitely 90 tablet 4   PARoxetine  (PAXIL ) 40 MG tablet Take 1/2-1 tablet (20-40 mg total) by mouth daily as directed 30 tablet 3   sennosides-docusate sodium  (SENOKOT-S) 8.6-50 MG tablet Take 2 tablets by mouth daily. 30 tablet 1   vitamin B-12 (CYANOCOBALAMIN ) 1000 MCG tablet Take 1,000 mcg by mouth daily.     No facility-administered medications prior to visit.     Per HPI unless specifically indicated in ROS section below Review of Systems  Constitutional:  Negative for fatigue and fever.  HENT:  Negative for congestion.   Eyes:  Negative for pain.  Respiratory:  Negative for cough and shortness of breath.   Cardiovascular:  Negative for chest pain, palpitations and leg swelling.  Gastrointestinal:  Negative for abdominal pain.  Genitourinary:  Negative for dysuria and vaginal bleeding.  Musculoskeletal:  Negative for back pain.  Neurological:  Negative for syncope, light-headedness and headaches.  Psychiatric/Behavioral:  Negative for dysphoric mood.    Objective:  BP (!) 140/80 (BP Location: Left Arm, Patient Position: Sitting, Cuff Size: Normal)   Pulse 94   Temp 97.8 F (36.6 C) (Temporal)   Ht 5' 2.75" (1.594 m)   Wt 137 lb 8 oz (62.4 kg)   SpO2 97%   BMI 24.55 kg/m   Wt Readings from Last 3 Encounters:  09/26/23 132 lb 9.6 oz (60.1 kg)  09/07/23 137 lb 8 oz (62.4 kg)  09/06/23 137 lb 3.2 oz (62.2 kg)      Physical Exam Constitutional:      General: She is not in acute distress.    Appearance: Normal appearance. She is well-developed. She is not ill-appearing or toxic-appearing.  HENT:      Head: Normocephalic.     Right Ear: Hearing, tympanic membrane, ear canal and external ear normal. Tympanic membrane is not erythematous, retracted or bulging.     Left Ear: Hearing, tympanic membrane, ear canal and external ear normal. Tympanic membrane is not erythematous, retracted or bulging.     Nose: No mucosal edema or rhinorrhea.     Right Sinus: No maxillary sinus tenderness or frontal sinus tenderness.     Left Sinus: No maxillary sinus tenderness or frontal sinus tenderness.     Mouth/Throat:     Pharynx: Uvula midline.  Eyes:     General: Lids are normal. Lids are everted, no foreign bodies appreciated.     Conjunctiva/sclera: Conjunctivae normal.     Pupils: Pupils are equal, round, and reactive to light.  Neck:  Thyroid : No thyroid  mass or thyromegaly.     Vascular: No carotid bruit.     Trachea: Trachea normal.  Cardiovascular:     Rate and Rhythm: Normal rate and regular rhythm.     Pulses: Normal pulses.     Heart sounds: Normal heart sounds, S1 normal and S2 normal. No murmur heard.    No friction rub. No gallop.  Pulmonary:     Effort: Pulmonary effort is normal. No tachypnea or respiratory distress.     Breath sounds: Normal breath sounds. No decreased breath sounds, wheezing, rhonchi or rales.  Abdominal:     General: Bowel sounds are normal.     Palpations: Abdomen is soft.     Tenderness: There is no abdominal tenderness.  Musculoskeletal:     Cervical back: Normal range of motion and neck supple.  Skin:    General: Skin is warm and dry.     Findings: No rash.  Neurological:     Mental Status: She is alert.  Psychiatric:        Mood and Affect: Mood is not anxious or depressed.        Speech: Speech normal.        Behavior: Behavior normal. Behavior is cooperative.        Thought Content: Thought content normal.        Judgment: Judgment normal.       Results for orders placed or performed during the hospital encounter of 09/04/23   Urinalysis, Routine w reflex microscopic -Urine, Clean Catch   Collection Time: 09/04/23 11:36 AM  Result Value Ref Range   Color, Urine STRAW (A) YELLOW   APPearance CLEAR CLEAR   Specific Gravity, Urine 1.005 1.005 - 1.030   pH 5.0 5.0 - 8.0   Glucose, UA NEGATIVE NEGATIVE mg/dL   Hgb urine dipstick SMALL (A) NEGATIVE   Bilirubin Urine NEGATIVE NEGATIVE   Ketones, ur NEGATIVE NEGATIVE mg/dL   Protein, ur NEGATIVE NEGATIVE mg/dL   Nitrite NEGATIVE NEGATIVE   Leukocytes,Ua TRACE (A) NEGATIVE   RBC / HPF 0-5 0 - 5 RBC/hpf   WBC, UA 0-5 0 - 5 WBC/hpf   Bacteria, UA RARE (A) NONE SEEN   Squamous Epithelial / HPF 0-5 0 - 5 /HPF   Mucus PRESENT   Lipase, blood   Collection Time: 09/04/23 11:44 AM  Result Value Ref Range   Lipase 18 11 - 51 U/L  Comprehensive metabolic panel   Collection Time: 09/04/23 11:44 AM  Result Value Ref Range   Sodium 133 (L) 135 - 145 mmol/L   Potassium 3.8 3.5 - 5.1 mmol/L   Chloride 99 98 - 111 mmol/L   CO2 25 22 - 32 mmol/L   Glucose, Bld 100 (H) 70 - 99 mg/dL   BUN 12 8 - 23 mg/dL   Creatinine, Ser 1.61 0.44 - 1.00 mg/dL   Calcium  8.8 (L) 8.9 - 10.3 mg/dL   Total Protein 6.1 (L) 6.5 - 8.1 g/dL   Albumin  3.5 3.5 - 5.0 g/dL   AST 52 (H) 15 - 41 U/L   ALT 35 0 - 44 U/L   Alkaline Phosphatase 79 38 - 126 U/L   Total Bilirubin 0.7 0.0 - 1.2 mg/dL   GFR, Estimated >09 >60 mL/min   Anion gap 9 5 - 15  CBC   Collection Time: 09/04/23 11:44 AM  Result Value Ref Range   WBC 8.4 4.0 - 10.5 K/uL   RBC 3.18 (L) 3.87 - 5.11 MIL/uL  Hemoglobin 10.0 (L) 12.0 - 15.0 g/dL   HCT 40.9 (L) 81.1 - 91.4 %   MCV 98.4 80.0 - 100.0 fL   MCH 31.4 26.0 - 34.0 pg   MCHC 31.9 30.0 - 36.0 g/dL   RDW 78.2 95.6 - 21.3 %   Platelets 282 150 - 400 K/uL   nRBC 0.0 0.0 - 0.2 %  Protime-INR   Collection Time: 09/04/23  9:04 PM  Result Value Ref Range   Prothrombin Time 14.0 11.4 - 15.2 seconds   INR 1.1 0.8 - 1.2  Type and screen   Collection Time: 09/04/23  9:05 PM   Result Value Ref Range   ABO/RH(D) A NEG    Antibody Screen NEG    Sample Expiration      09/07/2023,2359 Performed at [facility], [address] [place], [place] [address]   Hemoglobin and hematocrit, blood   Collection Time: 09/04/23 11:42 PM  Result Value Ref Range   Hemoglobin 9.4 (L) 12.0 - 15.0 g/dL   HCT 84.6 (L) 96.2 - 95.2 %  Hemoglobin and hematocrit, blood   Collection Time: 09/05/23  7:28 AM  Result Value Ref Range   Hemoglobin 9.4 (L) 12.0 - 15.0 g/dL   HCT 84.1 (L) 32.4 - 40.1 %  Surgical pathology   Collection Time: 09/05/23 11:57 AM  Result Value Ref Range   SURGICAL PATHOLOGY      SURGICAL PATHOLOGY CASE: [id number] PATIENT: Sulema Molina Surgical Pathology Report     Clinical History: dysphagia, coffee ground emesis, R/O H. Pylori, R/O EOE (cm)     FINAL MICROSCOPIC DIAGNOSIS:  A. STOMACH, BIOPSY: - Gastric oxyntic mucosa with no specific histopathologic changes - Helicobacter pylori-like organisms are not identified on routine HE stain  B. ESOPHAGUS, DISTAL, BIOPSY: - Mild acute nonspecific esophagitis - Negative for increased intraepithelial eosinophils  C. ESOPHAGUS, PROXIMAL, BIOPSY: - Mild acute nonspecific esophagitis - Negative for increased intraepithelial eosinophils      GROSS DESCRIPTION:  A: Received in formalin are tan, soft tissue fragments that are submitted in toto. Number: 2 size: 0.4 and 0.7 cm blocks: 1  B: Received in formalin is a tan, soft tissue fragment that is submitted in toto.  Size: 0.6 cm, 1 block submitted.  C: Received in formalin are tan, soft tissue fragments that are submitted in  toto. Number: 3 size: 0.2-0.3 cm blocks: 1 (GRP 09/05/2023)  Final Diagnosis performed by Lee Public, MD.   Electronically signed 09/06/2023 Technical component performed at [facility]. [facility], [address], [place], [place] [address].  Professional component performed at [facility], [address], [place], [place] [address].  Immunohistochemistry Technical component (if applicable) was performed at [facility]. [address], [place], [place] [address].   IMMUNOHISTOCHEMISTRY DISCLAIMER (if applicable): Some of these immunohistochemical stains may have been developed and the performance characteristics determine by [facility]. Some may not have been cleared or approved by the U.S. Food and Drug Administration. The FDA has determined that such clearance or approval is not necessary. This test is used for clinical purposes. It should not be regarded as investigational or for research. This l aboratory is certified under the Clinical Laboratory Improvement Amendments of 1988 (CLIA-88) as qualified to perform high complexity clinical laboratory testing.  The controls stained appropriately.   IHC stains are performed on formalin fixed, paraffin embedded tissue using a 3,3"diaminobenzidine (DAB) chromogen and Leica Bond Autostainer System. The staining intensity  of the nucleus is score manually and is reported as the percentage of tumor cell nuclei demonstrating specific nuclear staining. The specimens are fixed in 10% Neutral Formalin for at least 6 hours and up to 72hrs. These tests are validated on decalcified tissue. Results should be interpreted with caution given the possibility of false negative results on decalcified specimens. Antibody Clones are as follows ER-clone [id number], PR-clone 16, Ki67- clone MM1. Some of these immunohistochemical stains may have been developed and the performance characteristics determined by [facility].   Hemoglobin and hematocrit, blood   Collection Time: 09/05/23  4:38 PM  Result Value Ref Range   Hemoglobin 9.2 (L) 12.0 - 15.0 g/dL   HCT 60.4 (L) 54.0 - 98.1 %  Hemoglobin and hematocrit, blood   Collection Time: 09/05/23  9:06 PM  Result Value Ref Range    Hemoglobin 9.4 (L) 12.0 - 15.0 g/dL   HCT 19.1 (L) 47.8 - 29.5 %  Hemoglobin and hematocrit, blood   Collection Time: 09/06/23 12:39 AM  Result Value Ref Range   Hemoglobin 9.2 (L) 12.0 - 15.0 g/dL   HCT 62.1 (L) 30.8 - 65.7 %  CBC   Collection Time: 09/06/23  8:27 AM  Result Value Ref Range   WBC 6.4 4.0 - 10.5 K/uL   RBC 3.18 (L) 3.87 - 5.11 MIL/uL   Hemoglobin 10.1 (L) 12.0 - 15.0 g/dL   HCT 84.6 (L) 96.2 - 95.2 %   MCV 99.1 80.0 - 100.0 fL   MCH 31.8 26.0 - 34.0 pg   MCHC 32.1 30.0 - 36.0 g/dL   RDW 84.1 32.4 - 40.1 %   Platelets 305 150 - 400 K/uL   nRBC 0.0 0.0 - 0.2 %    Assessment and Plan  Gastrointestinal hemorrhage associated with acute gastritis Assessment & Plan:  Acute,  Per GI, discharge Protonix  40 mg twice daily for 6 weeks then continue daily indefinitely.  Need repeat EGD in 6 to 8 weeks to ensure healing.  Outpatient follow-up with GI 4 weeks.  Orders: -     Comprehensive metabolic panel with GFR; Future -     CBC with Differential/Platelet; Future  Swelling Assessment & Plan: Acute, will evaluate further with lab work.  Take furosemide  for fluid x 3 days.. call if swelling not improving.  If severe shortness of breath or oral swelling.Aaron Aas go to ER.  Orders: -     Brain natriuretic peptide; Future  Essential hypertension, benign Assessment & Plan: Stable, chronic.  Continue current medication.  Metoprolol  50 mg 2 times daily      Normocytic anemia Assessment & Plan: Due for repeat CBC.   Other orders -     Furosemide ; Take 1 tablet (20 mg total) by mouth daily.  Dispense: 3 tablet; Refill: 0    Return in about 1 week (around 09/14/2023) for lab only.   Herby Lolling, MD

## 2023-09-12 ENCOUNTER — Other Ambulatory Visit (INDEPENDENT_AMBULATORY_CARE_PROVIDER_SITE_OTHER)

## 2023-09-12 DIAGNOSIS — R609 Edema, unspecified: Secondary | ICD-10-CM | POA: Diagnosis not present

## 2023-09-12 DIAGNOSIS — K2901 Acute gastritis with bleeding: Secondary | ICD-10-CM | POA: Diagnosis not present

## 2023-09-12 LAB — CBC WITH DIFFERENTIAL/PLATELET
Basophils Absolute: 0.1 10*3/uL (ref 0.0–0.1)
Basophils Relative: 1.3 % (ref 0.0–3.0)
Eosinophils Absolute: 0.5 10*3/uL (ref 0.0–0.7)
Eosinophils Relative: 7 % — ABNORMAL HIGH (ref 0.0–5.0)
HCT: 34.9 % — ABNORMAL LOW (ref 36.0–46.0)
Hemoglobin: 11.4 g/dL — ABNORMAL LOW (ref 12.0–15.0)
Lymphocytes Relative: 23.7 % (ref 12.0–46.0)
Lymphs Abs: 1.8 10*3/uL (ref 0.7–4.0)
MCHC: 32.6 g/dL (ref 30.0–36.0)
MCV: 97.1 fl (ref 78.0–100.0)
Monocytes Absolute: 0.7 10*3/uL (ref 0.1–1.0)
Monocytes Relative: 8.5 % (ref 3.0–12.0)
Neutro Abs: 4.6 10*3/uL (ref 1.4–7.7)
Neutrophils Relative %: 59.5 % (ref 43.0–77.0)
Platelets: 369 10*3/uL (ref 150.0–400.0)
RBC: 3.59 Mil/uL — ABNORMAL LOW (ref 3.87–5.11)
RDW: 14.2 % (ref 11.5–15.5)
WBC: 7.8 10*3/uL (ref 4.0–10.5)

## 2023-09-12 LAB — COMPREHENSIVE METABOLIC PANEL WITH GFR
ALT: 11 U/L (ref 0–35)
AST: 13 U/L (ref 0–37)
Albumin: 3.8 g/dL (ref 3.5–5.2)
Alkaline Phosphatase: 95 U/L (ref 39–117)
BUN: 9 mg/dL (ref 6–23)
CO2: 32 meq/L (ref 19–32)
Calcium: 8.9 mg/dL (ref 8.4–10.5)
Chloride: 98 meq/L (ref 96–112)
Creatinine, Ser: 1.08 mg/dL (ref 0.40–1.20)
GFR: 48.87 mL/min — ABNORMAL LOW (ref >60.00)
Glucose, Bld: 93 mg/dL (ref 70–99)
Potassium: 4.4 meq/L (ref 3.5–5.1)
Sodium: 137 meq/L (ref 135–145)
Total Bilirubin: 0.3 mg/dL (ref 0.2–1.2)
Total Protein: 6.5 g/dL (ref 6.0–8.3)

## 2023-09-12 LAB — BRAIN NATRIURETIC PEPTIDE: Pro B Natriuretic peptide (BNP): 63 pg/mL (ref 0.0–100.0)

## 2023-09-14 ENCOUNTER — Telehealth: Payer: Self-pay | Admitting: Family Medicine

## 2023-09-14 ENCOUNTER — Other Ambulatory Visit (HOSPITAL_COMMUNITY): Payer: Self-pay

## 2023-09-14 MED ORDER — FUROSEMIDE 20 MG PO TABS
20.0000 mg | ORAL_TABLET | Freq: Every day | ORAL | 0 refills | Status: DC
  Filled 2023-09-14: qty 3, 3d supply, fill #0

## 2023-09-14 NOTE — Telephone Encounter (Signed)
 Copied from CRM [id number]. Topic: Clinical - Request for Lab/Test Order >> Sep 14, 2023 11:01 AM Aletta Edouard wrote: Reason for CRM: patient had labs done and would like the results from the lab work
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
## 2023-09-26 ENCOUNTER — Other Ambulatory Visit (HOSPITAL_COMMUNITY): Payer: Self-pay

## 2023-09-26 ENCOUNTER — Other Ambulatory Visit (INDEPENDENT_AMBULATORY_CARE_PROVIDER_SITE_OTHER)

## 2023-09-26 ENCOUNTER — Encounter: Payer: Self-pay | Admitting: Physician Assistant

## 2023-09-26 ENCOUNTER — Ambulatory Visit (INDEPENDENT_AMBULATORY_CARE_PROVIDER_SITE_OTHER): Admitting: Physician Assistant

## 2023-09-26 VITALS — BP 108/58 | HR 84 | Ht 62.75 in | Wt 132.6 lb

## 2023-09-26 DIAGNOSIS — K2970 Gastritis, unspecified, without bleeding: Secondary | ICD-10-CM

## 2023-09-26 DIAGNOSIS — D62 Acute posthemorrhagic anemia: Secondary | ICD-10-CM

## 2023-09-26 DIAGNOSIS — K2901 Acute gastritis with bleeding: Secondary | ICD-10-CM

## 2023-09-26 DIAGNOSIS — K209 Esophagitis, unspecified without bleeding: Secondary | ICD-10-CM | POA: Diagnosis not present

## 2023-09-26 DIAGNOSIS — K222 Esophageal obstruction: Secondary | ICD-10-CM

## 2023-09-26 DIAGNOSIS — K921 Melena: Secondary | ICD-10-CM

## 2023-09-26 DIAGNOSIS — K625 Hemorrhage of anus and rectum: Secondary | ICD-10-CM

## 2023-09-26 DIAGNOSIS — K2101 Gastro-esophageal reflux disease with esophagitis, with bleeding: Secondary | ICD-10-CM

## 2023-09-26 DIAGNOSIS — K5909 Other constipation: Secondary | ICD-10-CM

## 2023-09-26 DIAGNOSIS — K2210 Ulcer of esophagus without bleeding: Secondary | ICD-10-CM

## 2023-09-26 DIAGNOSIS — K5904 Chronic idiopathic constipation: Secondary | ICD-10-CM

## 2023-09-26 LAB — CBC WITH DIFFERENTIAL/PLATELET
Basophils Absolute: 0.1 10*3/uL (ref 0.0–0.1)
Basophils Relative: 1.2 % (ref 0.0–3.0)
Eosinophils Absolute: 0.4 10*3/uL (ref 0.0–0.7)
Eosinophils Relative: 6.2 % — ABNORMAL HIGH (ref 0.0–5.0)
HCT: 35.5 % — ABNORMAL LOW (ref 36.0–46.0)
Hemoglobin: 11.5 g/dL — ABNORMAL LOW (ref 12.0–15.0)
Lymphocytes Relative: 21.6 % (ref 12.0–46.0)
Lymphs Abs: 1.5 10*3/uL (ref 0.7–4.0)
MCHC: 32.3 g/dL (ref 30.0–36.0)
MCV: 95.6 fl (ref 78.0–100.0)
Monocytes Absolute: 0.5 10*3/uL (ref 0.1–1.0)
Monocytes Relative: 7.3 % (ref 3.0–12.0)
Neutro Abs: 4.6 10*3/uL (ref 1.4–7.7)
Neutrophils Relative %: 63.7 % (ref 43.0–77.0)
Platelets: 333 10*3/uL (ref 150.0–400.0)
RBC: 3.72 Mil/uL — ABNORMAL LOW (ref 3.87–5.11)
RDW: 15 % (ref 11.5–15.5)
WBC: 7.2 10*3/uL (ref 4.0–10.5)

## 2023-09-26 LAB — IBC + FERRITIN
Ferritin: 13.2 ng/mL (ref 10.0–291.0)
Iron: 85 ug/dL (ref 42–145)
Saturation Ratios: 20.2 % (ref 20.0–50.0)
TIBC: 421.4 ug/dL (ref 250.0–450.0)
Transferrin: 301 mg/dL (ref 212.0–360.0)

## 2023-09-26 LAB — B12 AND FOLATE PANEL
Folate: 9.4 ng/mL (ref >5.9)
Vitamin B-12: 387 pg/mL (ref 211–911)

## 2023-09-26 MED ORDER — NA SULFATE-K SULFATE-MG SULF 17.5-3.13-1.6 GM/177ML PO SOLN
1.0000 | Freq: Once | ORAL | 0 refills | Status: AC
  Filled 2023-09-26: qty 354, 1d supply, fill #0

## 2023-09-26 NOTE — Patient Instructions (Signed)
 Your provider has requested that you go to the basement level for lab work before leaving today. Press "B" on the elevator. The lab is located at the first door on the left as you exit the elevator.   We have sent the following medications to your pharmacy for you to pick up at your convenience: Suprep  You have been scheduled for an endoscopy and colonoscopy. Please follow the written instructions given to you at your visit today.  If you use inhalers (even only as needed), please bring them with you on the day of your procedure.  DO NOT TAKE 7 DAYS PRIOR TO TEST- Trulicity (dulaglutide) Ozempic, Wegovy (semaglutide) Mounjaro (tirzepatide) Bydureon Bcise (exanatide extended release)  DO NOT TAKE 1 DAY PRIOR TO YOUR TEST Rybelsus (semaglutide) Adlyxin (lixisenatide) Victoza (liraglutide) Byetta (exanatide) ___________________________________________________________________________  _______________________________________________________  If your blood pressure at your visit was 140/90 or greater, please contact your primary care physician to follow up on this.  _______________________________________________________  If you are age 82 or older, your body mass index should be between 23-30. Your Body mass index is 23.68 kg/m. If this is out of the aforementioned range listed, please consider follow up with your Primary Care Provider.  If you are age 32 or younger, your body mass index should be between 19-25. Your Body mass index is 23.68 kg/m. If this is out of the aformentioned range listed, please consider follow up with your Primary Care Provider.   ________________________________________________________  The [facility] providers would like to encourage you to use MYCHART to communicate with providers for non-urgent requests or questions.  Due to long hold times on the telephone, sending your provider a message by [facility] may be a faster and more efficient way to get a  response.  Please allow 48 business hours for a response.  Please remember that this is for non-urgent requests.  _______________________________________________________

## 2023-09-26 NOTE — Progress Notes (Signed)
 Call and notify patient labs shows stable, improved, mild chronic anemia.  Hemoglobin 11.5.  Iron, B12, and folate are all normal.  This is great news.  Continue with current plan for EGD and colonoscopy as scheduled. Brigitte Canard, PA-C

## 2023-09-27 ENCOUNTER — Other Ambulatory Visit (HOSPITAL_COMMUNITY): Payer: Self-pay

## 2023-09-27 MED ORDER — HYDROCODONE-ACETAMINOPHEN 10-325 MG PO TABS
1.0000 | ORAL_TABLET | Freq: Four times a day (QID) | ORAL | 0 refills | Status: DC | PRN
  Filled 2023-09-27: qty 120, 30d supply, fill #0

## 2023-10-09 DIAGNOSIS — K2901 Acute gastritis with bleeding: Secondary | ICD-10-CM | POA: Insufficient documentation

## 2023-10-09 NOTE — Assessment & Plan Note (Signed)
Due for repeat CBC

## 2023-10-09 NOTE — Assessment & Plan Note (Signed)
 Acute,  Per GI, discharge Protonix  40 mg twice daily for 6 weeks then continue daily indefinitely.  Need repeat EGD in 6 to 8 weeks to ensure healing.  Outpatient follow-up with GI 4 weeks.

## 2023-10-09 NOTE — Progress Notes (Signed)
 Agree with the assessment and plan as outlined by Brigitte Canard, PA-C.  Leidy Massar, DO, [facility]

## 2023-10-09 NOTE — Assessment & Plan Note (Signed)
 Stable, chronic.  Continue current medication.  Metoprolol 50 mg 2 times daily

## 2023-10-09 NOTE — Assessment & Plan Note (Addendum)
 Acute, will evaluate further with lab work.  Take furosemide  for fluid x 3 days.. call if swelling not improving.  If severe shortness of breath or oral swelling.Aaron Aas go to ER.

## 2023-10-23 ENCOUNTER — Encounter: Payer: Self-pay | Admitting: Pharmacist

## 2023-10-23 NOTE — Progress Notes (Signed)
 Pharmacy Quality Measure Review  This patient is appearing on a report for being at risk of failing the adherence measure for cholesterol (statin) medications this calendar year.   Medication: ATORVASTATIN  10 MG  Last fill date: 08/22/23 for 30 day supply  Insurance report was not up to date. No action needed at this time.  Last fill 09/27/23 for 30 day supply. Refill due soon. Does have refills remaining on file.  Fills with [facility].

## 2023-10-26 ENCOUNTER — Other Ambulatory Visit: Payer: Self-pay

## 2023-10-26 ENCOUNTER — Other Ambulatory Visit (HOSPITAL_COMMUNITY): Payer: Self-pay

## 2023-10-26 DIAGNOSIS — M4802 Spinal stenosis, cervical region: Secondary | ICD-10-CM | POA: Diagnosis not present

## 2023-10-26 DIAGNOSIS — M62830 Muscle spasm of back: Secondary | ICD-10-CM | POA: Diagnosis not present

## 2023-10-26 DIAGNOSIS — G894 Chronic pain syndrome: Secondary | ICD-10-CM | POA: Diagnosis not present

## 2023-10-26 DIAGNOSIS — M25552 Pain in left hip: Secondary | ICD-10-CM | POA: Diagnosis not present

## 2023-10-26 DIAGNOSIS — M150 Primary generalized (osteo)arthritis: Secondary | ICD-10-CM | POA: Diagnosis not present

## 2023-10-26 DIAGNOSIS — M48062 Spinal stenosis, lumbar region with neurogenic claudication: Secondary | ICD-10-CM | POA: Diagnosis not present

## 2023-10-26 DIAGNOSIS — M47816 Spondylosis without myelopathy or radiculopathy, lumbar region: Secondary | ICD-10-CM | POA: Diagnosis not present

## 2023-10-26 MED ORDER — METHOCARBAMOL 500 MG PO TABS
500.0000 mg | ORAL_TABLET | Freq: Four times a day (QID) | ORAL | 2 refills | Status: DC | PRN
  Filled 2023-10-26 (×2): qty 120, 30d supply, fill #0
  Filled 2023-11-23: qty 120, 30d supply, fill #1
  Filled 2023-12-29: qty 120, 30d supply, fill #2

## 2023-10-26 MED ORDER — BUSPIRONE HCL 5 MG PO TABS
5.0000 mg | ORAL_TABLET | Freq: Three times a day (TID) | ORAL | 1 refills | Status: DC | PRN
Start: 2023-10-26 — End: 2024-01-16
  Filled 2023-10-26 (×2): qty 90, 30d supply, fill #0
  Filled 2023-11-22 (×2): qty 90, 30d supply, fill #1

## 2023-10-26 MED ORDER — HYDROCODONE-ACETAMINOPHEN 10-325 MG PO TABS
1.0000 | ORAL_TABLET | Freq: Four times a day (QID) | ORAL | 0 refills | Status: DC | PRN
  Filled 2023-10-26 (×2): qty 120, 30d supply, fill #0

## 2023-10-26 MED ORDER — HYDROCODONE-ACETAMINOPHEN 10-325 MG PO TABS
1.0000 | ORAL_TABLET | Freq: Four times a day (QID) | ORAL | 0 refills | Status: DC | PRN
  Filled 2023-11-24: qty 120, 30d supply, fill #0

## 2023-10-26 MED ORDER — PAROXETINE HCL 40 MG PO TABS
20.0000 mg | ORAL_TABLET | Freq: Every day | ORAL | 3 refills | Status: DC
  Filled 2023-10-26 (×2): qty 30, 30d supply, fill #0
  Filled 2023-11-22 (×2): qty 30, 30d supply, fill #1
  Filled 2023-12-29: qty 30, 30d supply, fill #2

## 2023-10-31 ENCOUNTER — Encounter: Payer: Self-pay | Admitting: Gastroenterology

## 2023-10-31 ENCOUNTER — Ambulatory Visit (AMBULATORY_SURGERY_CENTER): Admitting: Gastroenterology

## 2023-10-31 VITALS — BP 139/98 | HR 72 | Temp 98.4°F | Resp 13 | Ht 62.0 in | Wt 132.0 lb

## 2023-10-31 DIAGNOSIS — K2100 Gastro-esophageal reflux disease with esophagitis, without bleeding: Secondary | ICD-10-CM | POA: Diagnosis not present

## 2023-10-31 DIAGNOSIS — K3189 Other diseases of stomach and duodenum: Secondary | ICD-10-CM | POA: Diagnosis not present

## 2023-10-31 DIAGNOSIS — K641 Second degree hemorrhoids: Secondary | ICD-10-CM

## 2023-10-31 DIAGNOSIS — K648 Other hemorrhoids: Secondary | ICD-10-CM | POA: Diagnosis not present

## 2023-10-31 DIAGNOSIS — K5909 Other constipation: Secondary | ICD-10-CM | POA: Diagnosis not present

## 2023-10-31 DIAGNOSIS — K222 Esophageal obstruction: Secondary | ICD-10-CM | POA: Diagnosis not present

## 2023-10-31 DIAGNOSIS — E785 Hyperlipidemia, unspecified: Secondary | ICD-10-CM | POA: Diagnosis not present

## 2023-10-31 DIAGNOSIS — K2970 Gastritis, unspecified, without bleeding: Secondary | ICD-10-CM | POA: Diagnosis not present

## 2023-10-31 DIAGNOSIS — K449 Diaphragmatic hernia without obstruction or gangrene: Secondary | ICD-10-CM

## 2023-10-31 DIAGNOSIS — K319 Disease of stomach and duodenum, unspecified: Secondary | ICD-10-CM | POA: Diagnosis not present

## 2023-10-31 DIAGNOSIS — D124 Benign neoplasm of descending colon: Secondary | ICD-10-CM

## 2023-10-31 DIAGNOSIS — K5904 Chronic idiopathic constipation: Secondary | ICD-10-CM

## 2023-10-31 DIAGNOSIS — I493 Ventricular premature depolarization: Secondary | ICD-10-CM | POA: Diagnosis not present

## 2023-10-31 MED ORDER — SODIUM CHLORIDE 0.9 % IV SOLN: 500.0000 mL | Freq: Once | INTRAVENOUS | Status: DC
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
## 2023-10-31 NOTE — Progress Notes (Signed)
 Called to room to assist during endoscopic procedure.  Patient ID and intended procedure confirmed with present staff. Received instructions for my participation in the procedure from the performing physician.

## 2023-10-31 NOTE — Progress Notes (Signed)
 Pt's states no medical or surgical changes since previsit or office visit.

## 2023-10-31 NOTE — Op Note (Signed)
 [facility] Patient Name: Tiffany [name] Procedure Date: 10/31/2023 2:55 PM MRN: [id number] Endoscopist: Harry Lindau , MD, [id number] Age: 78 Referring MD:  Date of Birth: [birth year] Gender: Female Account #: [id number] Procedure:                Colonoscopy Indications:              Hematochezia, Change in bowel habits, Constipation Medicines:                Monitored Anesthesia Care Procedure:                Pre-Anesthesia Assessment:                           - Prior to the procedure, a History and Physical                            was performed, and patient medications and                            allergies were reviewed. The patient's tolerance of                            previous anesthesia was also reviewed. The risks                            and benefits of the procedure and the sedation                            options and risks were discussed with the patient.                            All questions were answered, and informed consent                            was obtained. Prior Anticoagulants: The patient has                            taken no anticoagulant or antiplatelet agents. ASA                            Grade Assessment: III - A patient with severe                            systemic disease. After reviewing the risks and                            benefits, the patient was deemed in satisfactory                            condition to undergo the procedure.                           After obtaining informed consent, the colonoscope  was passed under direct vision. Throughout the                            procedure, the patient's blood pressure, pulse, and                            oxygen saturations were monitored continuously. The                            Olympus Scope SN: [id number] was introduced through                            the anus and advanced to the the cecum, identified                             by appendiceal orifice and ileocecal valve. The                            colonoscopy was technically difficult and complex                            due to poor bowel prep. The patient tolerated the                            procedure well. The quality of the bowel                            preparation was poor. The ileocecal valve,                            appendiceal orifice, and rectum were photographed. Scope In: 3:18:23 PM Scope Out: 3:31:12 PM Scope Withdrawal Time: 0 hours 6 minutes 20 seconds  Total Procedure Duration: 0 hours 12 minutes 49 seconds  Findings:                 The perianal and digital rectal examinations were                            normal.                           A 4 mm polyp was found in the descending colon. The                            polyp was sessile. The polyp was removed with a                            cold snare. Resection and retrieval were complete.                            Estimated blood loss was minimal.                           A large amount of semi-liquid stool was  found in                            the entire colon, precluding visualization. Lavage                            of the area was performed using copious amounts of                            sterile water , resulting in incomplete clearance                            with fair visualization.                           Non-bleeding internal hemorrhoids were found during                            retroflexion. The hemorrhoids were small. Complications:            No immediate complications. Estimated Blood Loss:     Estimated blood loss was minimal. Impression:               - Preparation of the colon was poor.                           - One 4 mm polyp in the descending colon, removed                            with a cold snare. Resected and retrieved.                           - Stool in the entire examined colon.                           - Non-bleeding internal  hemorrhoids. Recommendation:           - Patient has a contact number available for                            emergencies. The signs and symptoms of potential                            delayed complications were discussed with the                            patient. Return to normal activities tomorrow.                            Written discharge instructions were provided to the                            patient.                           - Resume previous diet.                           -  Continue present medications.                           - Await pathology results.                           - Consider repeat colonoscopy in 1 year because the                            bowel preparation was poor. If planning on repeat                            colonoscopy, recommend extended 2-day bowel                            preparation. Harry Lindau, MD 10/31/2023 3:53:46 PM

## 2023-10-31 NOTE — Op Note (Signed)
 [facility] Patient Name: Tiffany [name] Procedure Date: 10/31/2023 2:56 PM MRN: [id number] Endoscopist: Harry Lindau , MD, [id number] Age: 78 Referring MD:  Date of Birth: Mar 26, 1946 Gender: Female Account #: [id number] Procedure:                Upper GI endoscopy Indications:              Follow-up of erosive esophagitis and gastritis                           Hospital admission in 08/2023 for hematemesis and                            acute blood loss anemia. Inpatient EGD notable for                            LA Grade B erosive esophagitis, benign-appearing                            esophageal stricture, dilated with 20 mm TTS                            balloon. Esophageal biopsies negative for EOE. Mild                            non-H. pylori gastritis with a single shallow                            erosion in the distal gastric body. Was treated                            with high-dose PPI x 6 weeks with overall clinical                            improvement. No recurrence of hematemesis and                            reflux has been well-controlled. Presents today for                            endoscopic reevaluation. Hgb has been uptrending                            since hospital discharge with normal iron indices                            and normal B12/folate. Medicines:                Monitored Anesthesia Care Procedure:                Pre-Anesthesia Assessment:                           - Prior to the procedure, a History and Physical  was performed, and patient medications and                            allergies were reviewed. The patient's tolerance of                            previous anesthesia was also reviewed. The risks                            and benefits of the procedure and the sedation                            options and risks were discussed with the patient.                            All questions  were answered, and informed consent                            was obtained. Prior Anticoagulants: The patient has                            taken no anticoagulant or antiplatelet agents. ASA                            Grade Assessment: III - A patient with severe                            systemic disease. After reviewing the risks and                            benefits, the patient was deemed in satisfactory                            condition to undergo the procedure.                           After obtaining informed consent, the endoscope was                            passed under direct vision. Throughout the                            procedure, the patient's blood pressure, pulse, and                            oxygen saturations were monitored continuously. The                            GIF [id number] #[id number] was introduced through the                            mouth, and advanced to the second part of duodenum.  The upper GI endoscopy was accomplished without                            difficulty. The patient tolerated the procedure                            well. Scope In: Scope Out: Findings:                 One benign-appearing, intrinsic mild stenosis was                            found in the lower third of the esophagus. This                            stenosis measured less than one cm (in length). The                            stenosis was easily traversed. This was previously                            dilated with a 20 mm TTS balloon.                           The examined esophagus was normal.                           A small sliding type 1 cm hiatal hernia was present.                           Scattered mild inflammation characterized by                            erythema was found in the gastric body and in the                            gastric antrum. Biopsies were taken with a cold                            forceps for  histology and Helicobacter pylori                            testing. Estimated blood loss was minimal.                           The examined duodenum was normal. Complications:            No immediate complications. Estimated Blood Loss:     Estimated blood loss was minimal. Impression:               - Benign-appearing esophageal stenosis.                           - Normal esophagus.                           -  1 cm hiatal hernia.                           - Mild, non-ulcer gastritis. Biopsied.                           - Normal examined duodenum. Recommendation:           - Patient has a contact number available for                            emergencies. The signs and symptoms of potential                            delayed complications were discussed with the                            patient. Return to normal activities tomorrow.                            Written discharge instructions were provided to the                            patient.                           - Resume previous diet.                           - Continue present medications.                           - Await pathology results.                           - Colonoscopy today. Harry Lindau, MD 10/31/2023 3:47:51 PM

## 2023-10-31 NOTE — Patient Instructions (Addendum)
 Resume previous diet and medications. Awaiting pathology results.  Repeat Colonoscopy in 1 year due to poor prep. (Recommend extended 2 day bowel prep needed.)  YOU HAD AN ENDOSCOPIC PROCEDURE TODAY AT THE [facility]:   Refer to the procedure report that was given to you for any specific questions about what was found during the examination.  If the procedure report does not answer your questions, please call your gastroenterologist to clarify.  If you requested that your care partner not be given the details of your procedure findings, then the procedure report has been included in a sealed envelope for you to review at your convenience later.  YOU SHOULD EXPECT: Some feelings of bloating in the abdomen. Passage of more gas than usual.  Walking can help get rid of the air that was put into your GI tract during the procedure and reduce the bloating. If you had a lower endoscopy (such as a colonoscopy or flexible sigmoidoscopy) you may notice spotting of blood in your stool or on the toilet paper. If you underwent a bowel prep for your procedure, you may not have a normal bowel movement for a few days.  Please Note:  You might notice some irritation and congestion in your nose or some drainage.  This is from the oxygen used during your procedure.  There is no need for concern and it should clear up in a day or so.  SYMPTOMS TO REPORT IMMEDIATELY:  Following lower endoscopy (colonoscopy or flexible sigmoidoscopy):  Excessive amounts of blood in the stool  Significant tenderness or worsening of abdominal pains  Swelling of the abdomen that is new, acute  Fever of 100F or higher  Following upper endoscopy (EGD)  Vomiting of blood or coffee ground material  New chest pain or pain under the shoulder blades  Painful or persistently difficult swallowing  New shortness of breath  Fever of 100F or higher  Black, tarry-looking stools  For urgent or emergent issues, a gastroenterologist  can be reached at any hour by calling [phone]. Do not use MyChart messaging for urgent concerns.    DIET:  We do recommend a small meal at first, but then you may proceed to your regular diet.  Drink plenty of fluids but you should avoid alcoholic beverages for 24 hours.  ACTIVITY:  You should plan to take it easy for the rest of today and you should NOT DRIVE or use heavy machinery until tomorrow (because of the sedation medicines used during the test).    FOLLOW UP: Our staff will call the number listed on your records the next business day following your procedure.  We will call around 7:15- 8:00 am to check on you and address any questions or concerns that you may have regarding the information given to you following your procedure. If we do not reach you, we will leave a message.     If any biopsies were taken you will be contacted by phone or by letter within the next 1-3 weeks.  Please call us  at [phone] if you have not heard about the biopsies in 3 weeks.    SIGNATURES/CONFIDENTIALITY: You and/or your care partner have signed paperwork which will be entered into your electronic medical record.  These signatures attest to the fact that that the information above on your After Visit Summary has been reviewed and is understood.  Full responsibility of the confidentiality of this discharge information lies with you and/or your care-partner.

## 2023-10-31 NOTE — Progress Notes (Signed)
 Pt A/O x 3, gd SR's, pleased with anesthesia, report to RN

## 2023-11-01 ENCOUNTER — Telehealth: Payer: Self-pay | Admitting: *Deleted

## 2023-11-01 NOTE — Telephone Encounter (Signed)
 Left message on f/u call

## 2023-11-03 LAB — SURGICAL PATHOLOGY

## 2023-11-07 ENCOUNTER — Ambulatory Visit: Payer: Self-pay | Admitting: Gastroenterology

## 2023-11-14 ENCOUNTER — Ambulatory Visit: Payer: Self-pay

## 2023-11-14 NOTE — Telephone Encounter (Signed)
 Appointment with Dr. Cherlyn Cornet 11/15/23 at 11:00 am.

## 2023-11-14 NOTE — Telephone Encounter (Signed)
 Copied from CRM [id number]. Topic: Clinical - Red Word Triage >> Nov 14, 2023 12:22 PM [place] E wrote: [facility] that prompted transfer to Nurse Triage: Fall. Patient fell this past Friday and now is now having a hard time breathing along with left side rib cage pain.  Chief Complaint: rib pain and sob due to a fall last Friday; No LOC, denies hitting head Symptoms: pain to left side rib  Frequency: constant Pertinent Negatives: Patient denies fever, cough, dizziness Disposition: [] ED /[] Urgent Care (no appt availability in office) / [x] Appointment(In office/virtual)/ []  [facility]/ [] Home Care/ [] Refused Recommended Disposition /[] [facility]/ []  Follow-up with PCP Additional Notes: apt made for tomorrow; care advice given, denies questions; instructed to go to ER if becomes worse.   Reason for Disposition  [1] MILD difficulty breathing (e.g., minimal/no SOB at rest, SOB with walking, pulse <100) AND [2] NEW-onset or WORSE than normal  Answer Assessment - Initial Assessment Questions 1. RESPIRATORY STATUS: "Describe your breathing?" (e.g., wheezing, shortness of breath, unable to speak, severe coughing)      Short of breathe when she sits down, can't take a deep breath because it hurts on the left side 2. ONSET: "When did this breathing problem begin?"      Friday 3. PATTERN "Does the difficult breathing come and go, or has it been constant since it started?"      constant 4. SEVERITY: "How bad is your breathing?" (e.g., mild, moderate, severe)    - MILD: No SOB at rest, mild SOB with walking, speaks normally in sentences, can lie down, no retractions, pulse < 100.    - MODERATE: SOB at rest, SOB with minimal exertion and prefers to sit, cannot lie down flat, speaks in phrases, mild retractions, audible wheezing, pulse 100-120.    - SEVERE: Very SOB at rest, speaks in single words, struggling to breathe, sitting hunched forward, retractions, pulse > 120       moderate 5. RECURRENT SYMPTOM: "Have you had difficulty breathing before?" If Yes, ask: "When was the last time?" and "What happened that time?"      no 6. CARDIAC HISTORY: "Do you have any history of heart disease?" (e.g., heart attack, angina, bypass surgery, angioplasty)      no 7. LUNG HISTORY: "Do you have any history of lung disease?"  (e.g., pulmonary embolus, asthma, emphysema)     no 8. CAUSE: "What do you think is causing the breathing problem?"      Rib pain, from a fall 9. OTHER SYMPTOMS: "Do you have any other symptoms? (e.g., dizziness, runny nose, cough, chest pain, fever)     no 10. O2 SATURATION MONITOR:  "Do you use an oxygen saturation monitor (pulse oximeter) at home?" If Yes, ask: "What is your reading (oxygen level) today?" "What is your usual oxygen saturation reading?" (e.g., 95%)       no 11. PREGNANCY: "Is there any chance you are pregnant?" "When was your last menstrual period?"       no 12. TRAVEL: "Have you traveled out of the country in the last month?" (e.g., travel history, exposures)       no  Protocols used: Breathing Difficulty-A-AH

## 2023-11-15 ENCOUNTER — Encounter: Payer: Self-pay | Admitting: Family Medicine

## 2023-11-15 ENCOUNTER — Other Ambulatory Visit (HOSPITAL_COMMUNITY): Payer: Self-pay

## 2023-11-15 ENCOUNTER — Ambulatory Visit (INDEPENDENT_AMBULATORY_CARE_PROVIDER_SITE_OTHER): Admitting: Family Medicine

## 2023-11-15 VITALS — BP 118/62 | HR 41 | Temp 99.4°F | Ht 62.75 in | Wt 131.2 lb

## 2023-11-15 DIAGNOSIS — R0781 Pleurodynia: Secondary | ICD-10-CM

## 2023-11-15 DIAGNOSIS — W19XXXA Unspecified fall, initial encounter: Secondary | ICD-10-CM | POA: Diagnosis not present

## 2023-11-15 MED ORDER — LIDOCAINE 4 % EX PTCH
1.0000 | MEDICATED_PATCH | CUTANEOUS | 0 refills | Status: AC
  Filled 2023-11-15: qty 5, 5d supply, fill #0

## 2023-11-15 NOTE — Progress Notes (Signed)
 Patient ID: Tiffany [name], female    DOB: 11-04-1945, 78 y.o.   MRN: [id number]  This visit was conducted in person.  BP 118/62   Pulse (!) 41   Temp 99.4 F (37.4 C) (Oral)   Ht 5' 2.75 (1.594 m)   Wt 131 lb 3.2 oz (59.5 kg)   SpO2 100%   BMI 23.43 kg/m    CC:  Chief Complaint  Patient presents with   Rib Injury    Patient fell on 5/30 left side hurts. It hurts to sit up, comfortable position is when she is laying down     Shortness of Breath    When she breathes it causes the ribs to hurt     Subjective:   HPI: Tiffany [name] is a 78 y.o. female presenting on 11/15/2023 for Rib Injury (Patient fell on 5/30 left side hurts. It hurts to sit up, comfortable position is when she is laying down  ) and Shortness of Breath (When she breathes it causes the ribs to hurt )   Pt fell 5/30 accidental, lost balance, stepped wrong.  No proceeding symptoms.   Feel onto left side rib cage. No bruising.   Pain over left rib cage.  Painful with deep breaths... 8/10 at times.SABRA able to sleep at night.  No cough, no fever.     Only on chronic hydrocodone  acetaminophen  for hip pain chronically.viewed... on 4 times a day.  Applying heat.    Controlling pain overall.  Allergies and medications reviewed and updated. No facility-administered medications prior to visit.   Outpatient Medications Prior to Visit  Medication Sig Dispense Refill   amLODipine  (NORVASC ) 2.5 MG tablet Take 1 tablet (2.5 mg total) by mouth daily. FOR RAYNAUDS 30 tablet 11   atorvastatin  (LIPITOR) 10 MG tablet Take 1 tablet (10 mg total) by mouth daily. 30 tablet 6   busPIRone  (BUSPAR ) 5 MG tablet Take 1 tablet (5 mg total) by mouth 3 (three) times daily as needed. (Patient taking differently: Take 5 mg by mouth 3 (three) times daily as needed (anxiety).) 90 tablet 1   Cholecalciferol  (VITAMIN D3 PO) Take 1 tablet by mouth daily.     HYDROcodone -acetaminophen  (NORCO) 10-325 MG tablet Take 1 tablet by  mouth 4 (four) times daily as needed for pain. 120 tablet 0   methocarbamol  (ROBAXIN ) 500 MG tablet Take 1 tablet (500 mg total) by mouth 4 (four) times daily as needed. 120 tablet 2   nortriptyline  (PAMELOR ) 50 MG capsule Take 3 capsules (150 mg total) by mouth at bedtime. 270 capsule 1   pantoprazole  (PROTONIX ) 40 MG tablet Take 1 tablet (40 mg) twice a day before a meal for 6 weeks, then 1 tablet (40 mg) once daily indefinitely 90 tablet 4   PARoxetine  (PAXIL ) 40 MG tablet Take 1/2-1 tablet (20-40 mg total) by mouth daily as directed 30 tablet 3   PARoxetine  (PAXIL ) 40 MG tablet Take 1/2-1 tablet (20-40 mg total) by mouth daily as directed. 30 tablet 3   sennosides-docusate sodium  (SENOKOT-S) 8.6-50 MG tablet Take 2 tablets by mouth daily. 30 tablet 1   vitamin B-12 (CYANOCOBALAMIN ) 1000 MCG tablet Take 1,000 mcg by mouth daily.     furosemide  (LASIX ) 20 MG tablet Take 1 tablet (20 mg total) by mouth daily. 3 tablet 0   HYDROcodone -acetaminophen  (NORCO) 10-325 MG tablet Take 1 tablet by mouth 4 (four) times daily as needed for pain. 120 tablet 0   HYDROcodone -acetaminophen  (NORCO) 10-325 MG tablet  Take 1 tablet by mouth 4 (four) times daily as needed for pain. 120 tablet 0   ondansetron  (ZOFRAN -ODT) 4 MG disintegrating tablet Take 1 tablet (4 mg total) by mouth every 8 (eight) hours as needed for nausea or vomiting. (Patient not taking: Reported on 10/31/2023) 20 tablet 0     Per HPI unless specifically indicated in ROS section below Review of Systems  Constitutional:  Negative for fatigue and fever.  HENT:  Negative for congestion.   Eyes:  Negative for pain.  Respiratory:  Negative for cough and shortness of breath.   Cardiovascular:  Negative for chest pain, palpitations and leg swelling.  Gastrointestinal:  Negative for abdominal pain.  Genitourinary:  Negative for dysuria and vaginal bleeding.  Musculoskeletal:  Negative for back pain.  Neurological:  Negative for syncope,  light-headedness and headaches.  Psychiatric/Behavioral:  Negative for dysphoric mood.    Objective:  BP 118/62   Pulse (!) 41   Temp 99.4 F (37.4 C) (Oral)   Ht 5' 2.75 (1.594 m)   Wt 131 lb 3.2 oz (59.5 kg)   SpO2 100%   BMI 23.43 kg/m   Wt Readings from Last 3 Encounters:  12/21/23 129 lb 13.6 oz (58.9 kg)  11/15/23 131 lb 3.2 oz (59.5 kg)  10/31/23 132 lb (59.9 kg)      Physical Exam Constitutional:      General: She is not in acute distress.    Appearance: Normal appearance. She is well-developed. She is not ill-appearing or toxic-appearing.  HENT:     Head: Normocephalic.     Right Ear: Hearing, tympanic membrane, ear canal and external ear normal. Tympanic membrane is not erythematous, retracted or bulging.     Left Ear: Hearing, tympanic membrane, ear canal and external ear normal. Tympanic membrane is not erythematous, retracted or bulging.     Nose: No mucosal edema or rhinorrhea.     Right Sinus: No maxillary sinus tenderness or frontal sinus tenderness.     Left Sinus: No maxillary sinus tenderness or frontal sinus tenderness.     Mouth/Throat:     Pharynx: Uvula midline.  Eyes:     General: Lids are normal. Lids are everted, no foreign bodies appreciated.     Conjunctiva/sclera: Conjunctivae normal.     Pupils: Pupils are equal, round, and reactive to light.  Neck:     Thyroid : No thyroid  mass or thyromegaly.     Vascular: No carotid bruit.     Trachea: Trachea normal.  Cardiovascular:     Rate and Rhythm: Normal rate and regular rhythm.     Pulses: Normal pulses.     Heart sounds: Normal heart sounds, S1 normal and S2 normal. No murmur heard.    No friction rub. No gallop.  Pulmonary:     Effort: Pulmonary effort is normal. No tachypnea or respiratory distress.     Breath sounds: Normal breath sounds. No decreased breath sounds, wheezing, rhonchi or rales.  Chest:     Chest wall: Tenderness present.    Abdominal:     General: Bowel sounds are  normal.     Palpations: Abdomen is soft.     Tenderness: There is no abdominal tenderness.  Musculoskeletal:     Cervical back: Normal range of motion and neck supple.  Skin:    General: Skin is warm and dry.     Findings: No rash.  Neurological:     Mental Status: She is alert.  Psychiatric:  Mood and Affect: Mood is not anxious or depressed.        Speech: Speech normal.        Behavior: Behavior normal. Behavior is cooperative.        Thought Content: Thought content normal.        Judgment: Judgment normal.       Results for orders placed or performed in visit on 10/31/23  Surgical pathology ([facility])   Collection Time: 10/31/23 12:00 AM  Result Value Ref Range   SURGICAL PATHOLOGY      SURGICAL PATHOLOGY [facility] [address], [address] [place], [place] [address] Telephone [phone] or [phone] Fax [phone]  REPORT OF SURGICAL PATHOLOGY   Accession #: [id number] Patient Name: MARNA, WENIGER Visit # : [id number]  MRN: [id number] Physician: San Fontana DOB/Age [birth year] (Age: 34) Gender: F Collected Date: 10/31/2023 Received Date: 11/02/2023  FINAL DIAGNOSIS       1. Surgical [P], gastric :       - GASTRIC ANTRAL AND OXYNTIC MUCOSA WITH MILD NONSPECIFIC REACTIVE GASTROPATHY      - HELICOBACTER PYLORI-LIKE ORGANISMS ARE NOT IDENTIFIED ON ROUTINE H&E STAIN       2. Surgical [P], colon, descending, polyp (1) :       - TUBULAR ADENOMA      - NEGATIVE FOR HIGH-GRADE DYSPLASIA OR MALIGNANCY       ELECTRONIC SIGNATURE : Kashikar Md, Nilesh, Sports administrator, International aid/development worker  MICROSCOPIC DESCRIPTION  CASE COMMENTS STAINS USED IN DIAGNOSIS: H&E H&E    CLINICAL HISTORY  SPECIMEN(S) OBTAIN ED 1. Surgical [P], Gastric 2. Surgical [P], Colon, Descending, Polyp (1)  SPECIMEN COMMENTS: 1. Erosive esophagitis; chronic idiopathic constipation; gastritis and gastroduodenitis; benign neoplasm of  descending colon SPECIMEN CLINICAL INFORMATION: 1. R/O H.pylori 2. R/O adenoma    Gross Description 1. Received in formalin are tan, soft tissue fragments that are submitted in toto.Number: 2 Size: 0.3 cm, (1B) ( TA ) 2. Received in formalin are tan, soft tissue fragments that are submitted in toto.Number: 1 Size: 1.0 cm, (1B) ( TA )        Report signed out from the following location(s) . [facility] [address] [place], [place] [address] CLIA #: [id number]  [facility] [address] [place], [place] [address] CLIA #: [id number]     Assessment and Plan  Fall, initial encounter  Rib pain on left side  Other orders -     Lidocaine ; Place 1 patch onto the skin daily.  Dispense: 5 patch; Refill: 0  Clear lung exam.  NO indication for X-ray.  Will treat with lidocaine  patch for pain.  Encouraged her to take deep breaths  to prevent pneumonia.  Return and ER precautions given.  No follow-ups on file.   Greig Ring, MD

## 2023-11-16 ENCOUNTER — Encounter: Payer: Self-pay | Admitting: Pharmacist

## 2023-11-16 NOTE — Progress Notes (Signed)
 Pharmacy Quality Measure Review  This patient is appearing on a report for being at risk of failing the adherence measure for cholesterol (statin) medications this calendar year.   Medication: Atorvastatin  10 mg Last fill date: 09/27/23 for 30 day supply  Contacted pharmacy to facilitate refills.

## 2023-11-22 ENCOUNTER — Other Ambulatory Visit (HOSPITAL_COMMUNITY): Payer: Self-pay

## 2023-11-23 ENCOUNTER — Other Ambulatory Visit (HOSPITAL_COMMUNITY): Payer: Self-pay

## 2023-11-24 ENCOUNTER — Other Ambulatory Visit (HOSPITAL_COMMUNITY): Payer: Self-pay

## 2023-12-07 ENCOUNTER — Telehealth: Payer: Self-pay | Admitting: *Deleted

## 2023-12-07 DIAGNOSIS — M5416 Radiculopathy, lumbar region: Secondary | ICD-10-CM | POA: Diagnosis not present

## 2023-12-07 DIAGNOSIS — M48062 Spinal stenosis, lumbar region with neurogenic claudication: Secondary | ICD-10-CM | POA: Diagnosis not present

## 2023-12-07 DIAGNOSIS — M349 Systemic sclerosis, unspecified: Secondary | ICD-10-CM

## 2023-12-07 NOTE — Telephone Encounter (Signed)
 Copied from CRM [id number]. Topic: Referral - Request for Referral >> Dec 07, 2023  4:07 PM [place] E wrote: Did the patient discuss referral with their provider in the last year? Yes (If No - schedule appointment) (If Yes - send message)  Appointment offered? No  Type of order/referral and detailed reason for visit: Rheumatology, patient's pain management provider advised patient to get a rheumatology referral from PCP.  Preference of office, provider, location: No preference, just anywhere that is in-network with insurance.  If referral order, have you been seen by this specialty before? No (If Yes, this issue or another issue? When? Where?  Can we respond through MyChart? Yes

## 2023-12-07 NOTE — Telephone Encounter (Signed)
 I need more information to do this referral.  Please obtain last office visit note from her pain specialist so I can see what reason he requested the referral.

## 2023-12-08 NOTE — Addendum Note (Signed)
 Addended byBETHA AVELINA NO E on: 12/08/2023 01:15 PM   Modules accepted: Orders

## 2023-12-08 NOTE — Telephone Encounter (Signed)
 Spoke with Tiffany [name].  They are wanting the referral to  Rheumatology for Scleroderma.  I called Dr. Orlando office to get last office note but the office is closed.  Will fax over request.

## 2023-12-08 NOTE — Telephone Encounter (Signed)
 Referral placed.

## 2023-12-12 ENCOUNTER — Other Ambulatory Visit (HOSPITAL_COMMUNITY): Payer: Self-pay

## 2023-12-12 ENCOUNTER — Other Ambulatory Visit: Payer: Self-pay

## 2023-12-13 ENCOUNTER — Other Ambulatory Visit (HOSPITAL_COMMUNITY): Payer: Self-pay

## 2023-12-19 ENCOUNTER — Other Ambulatory Visit (HOSPITAL_COMMUNITY): Payer: Self-pay

## 2023-12-20 ENCOUNTER — Emergency Department (HOSPITAL_COMMUNITY)

## 2023-12-20 ENCOUNTER — Inpatient Hospital Stay (HOSPITAL_COMMUNITY)
Admission: EM | Admit: 2023-12-20 | Discharge: 2023-12-26 | DRG: 480 | Disposition: A | Discharge status: Skilled Nursing Facility | Attending: Internal Medicine | Admitting: Internal Medicine

## 2023-12-20 ENCOUNTER — Other Ambulatory Visit: Payer: Self-pay

## 2023-12-20 ENCOUNTER — Encounter (HOSPITAL_COMMUNITY): Payer: Self-pay

## 2023-12-20 DIAGNOSIS — M810 Age-related osteoporosis without current pathological fracture: Secondary | ICD-10-CM | POA: Diagnosis not present

## 2023-12-20 DIAGNOSIS — R296 Repeated falls: Secondary | ICD-10-CM | POA: Diagnosis present

## 2023-12-20 DIAGNOSIS — Z743 Need for continuous supervision: Secondary | ICD-10-CM | POA: Diagnosis not present

## 2023-12-20 DIAGNOSIS — Z66 Do not resuscitate: Secondary | ICD-10-CM | POA: Diagnosis present

## 2023-12-20 DIAGNOSIS — S199XXA Unspecified injury of neck, initial encounter: Secondary | ICD-10-CM | POA: Diagnosis not present

## 2023-12-20 DIAGNOSIS — M4312 Spondylolisthesis, cervical region: Secondary | ICD-10-CM | POA: Diagnosis not present

## 2023-12-20 DIAGNOSIS — S72142D Displaced intertrochanteric fracture of left femur, subsequent encounter for closed fracture with routine healing: Secondary | ICD-10-CM | POA: Diagnosis not present

## 2023-12-20 DIAGNOSIS — S0990XA Unspecified injury of head, initial encounter: Secondary | ICD-10-CM | POA: Diagnosis not present

## 2023-12-20 DIAGNOSIS — R0989 Other specified symptoms and signs involving the circulatory and respiratory systems: Secondary | ICD-10-CM | POA: Diagnosis not present

## 2023-12-20 DIAGNOSIS — F419 Anxiety disorder, unspecified: Secondary | ICD-10-CM | POA: Diagnosis present

## 2023-12-20 DIAGNOSIS — S72142A Displaced intertrochanteric fracture of left femur, initial encounter for closed fracture: Secondary | ICD-10-CM | POA: Diagnosis present

## 2023-12-20 DIAGNOSIS — E785 Hyperlipidemia, unspecified: Secondary | ICD-10-CM | POA: Diagnosis not present

## 2023-12-20 DIAGNOSIS — Z886 Allergy status to analgesic agent status: Secondary | ICD-10-CM

## 2023-12-20 DIAGNOSIS — Z043 Encounter for examination and observation following other accident: Secondary | ICD-10-CM | POA: Diagnosis not present

## 2023-12-20 DIAGNOSIS — I10 Essential (primary) hypertension: Secondary | ICD-10-CM | POA: Diagnosis present

## 2023-12-20 DIAGNOSIS — I7300 Raynaud's syndrome without gangrene: Secondary | ICD-10-CM | POA: Diagnosis not present

## 2023-12-20 DIAGNOSIS — Z79899 Other long term (current) drug therapy: Secondary | ICD-10-CM

## 2023-12-20 DIAGNOSIS — K219 Gastro-esophageal reflux disease without esophagitis: Secondary | ICD-10-CM | POA: Diagnosis not present

## 2023-12-20 DIAGNOSIS — F39 Unspecified mood [affective] disorder: Secondary | ICD-10-CM | POA: Diagnosis present

## 2023-12-20 DIAGNOSIS — R1310 Dysphagia, unspecified: Secondary | ICD-10-CM | POA: Diagnosis present

## 2023-12-20 DIAGNOSIS — Z781 Physical restraint status: Secondary | ICD-10-CM | POA: Diagnosis not present

## 2023-12-20 DIAGNOSIS — Z803 Family history of malignant neoplasm of breast: Secondary | ICD-10-CM

## 2023-12-20 DIAGNOSIS — J9601 Acute respiratory failure with hypoxia: Secondary | ICD-10-CM | POA: Diagnosis not present

## 2023-12-20 DIAGNOSIS — G4700 Insomnia, unspecified: Secondary | ICD-10-CM | POA: Diagnosis not present

## 2023-12-20 DIAGNOSIS — M80052A Age-related osteoporosis with current pathological fracture, left femur, initial encounter for fracture: Principal | ICD-10-CM | POA: Diagnosis present

## 2023-12-20 DIAGNOSIS — I499 Cardiac arrhythmia, unspecified: Secondary | ICD-10-CM | POA: Diagnosis not present

## 2023-12-20 DIAGNOSIS — R531 Weakness: Secondary | ICD-10-CM | POA: Diagnosis not present

## 2023-12-20 DIAGNOSIS — E871 Hypo-osmolality and hyponatremia: Secondary | ICD-10-CM | POA: Diagnosis not present

## 2023-12-20 DIAGNOSIS — I2510 Atherosclerotic heart disease of native coronary artery without angina pectoris: Secondary | ICD-10-CM | POA: Diagnosis present

## 2023-12-20 DIAGNOSIS — Z808 Family history of malignant neoplasm of other organs or systems: Secondary | ICD-10-CM | POA: Diagnosis not present

## 2023-12-20 DIAGNOSIS — M5030 Other cervical disc degeneration, unspecified cervical region: Secondary | ICD-10-CM | POA: Diagnosis not present

## 2023-12-20 DIAGNOSIS — S72002A Fracture of unspecified part of neck of left femur, initial encounter for closed fracture: Principal | ICD-10-CM

## 2023-12-20 DIAGNOSIS — F32A Depression, unspecified: Secondary | ICD-10-CM | POA: Diagnosis present

## 2023-12-20 DIAGNOSIS — E8809 Other disorders of plasma-protein metabolism, not elsewhere classified: Secondary | ICD-10-CM | POA: Diagnosis present

## 2023-12-20 DIAGNOSIS — M1612 Unilateral primary osteoarthritis, left hip: Secondary | ICD-10-CM | POA: Diagnosis not present

## 2023-12-20 DIAGNOSIS — J9811 Atelectasis: Secondary | ICD-10-CM | POA: Diagnosis not present

## 2023-12-20 DIAGNOSIS — Z96611 Presence of right artificial shoulder joint: Secondary | ICD-10-CM | POA: Diagnosis present

## 2023-12-20 DIAGNOSIS — R001 Bradycardia, unspecified: Secondary | ICD-10-CM | POA: Diagnosis not present

## 2023-12-20 DIAGNOSIS — M6281 Muscle weakness (generalized): Secondary | ICD-10-CM | POA: Diagnosis not present

## 2023-12-20 DIAGNOSIS — M25552 Pain in left hip: Secondary | ICD-10-CM | POA: Diagnosis present

## 2023-12-20 DIAGNOSIS — Z885 Allergy status to narcotic agent status: Secondary | ICD-10-CM

## 2023-12-20 DIAGNOSIS — K224 Dyskinesia of esophagus: Secondary | ICD-10-CM | POA: Diagnosis not present

## 2023-12-20 DIAGNOSIS — R2689 Other abnormalities of gait and mobility: Secondary | ICD-10-CM | POA: Diagnosis not present

## 2023-12-20 DIAGNOSIS — E042 Nontoxic multinodular goiter: Secondary | ICD-10-CM | POA: Diagnosis not present

## 2023-12-20 DIAGNOSIS — R0902 Hypoxemia: Secondary | ICD-10-CM | POA: Diagnosis not present

## 2023-12-20 DIAGNOSIS — Z91040 Latex allergy status: Secondary | ICD-10-CM

## 2023-12-20 DIAGNOSIS — K2100 Gastro-esophageal reflux disease with esophagitis, without bleeding: Secondary | ICD-10-CM | POA: Diagnosis present

## 2023-12-20 DIAGNOSIS — D62 Acute posthemorrhagic anemia: Secondary | ICD-10-CM | POA: Diagnosis not present

## 2023-12-20 DIAGNOSIS — Z8711 Personal history of peptic ulcer disease: Secondary | ICD-10-CM

## 2023-12-20 DIAGNOSIS — R0602 Shortness of breath: Secondary | ICD-10-CM | POA: Diagnosis not present

## 2023-12-20 DIAGNOSIS — S72009A Fracture of unspecified part of neck of unspecified femur, initial encounter for closed fracture: Secondary | ICD-10-CM | POA: Diagnosis not present

## 2023-12-20 DIAGNOSIS — W19XXXA Unspecified fall, initial encounter: Secondary | ICD-10-CM

## 2023-12-20 DIAGNOSIS — I700 Atherosclerosis of aorta: Secondary | ICD-10-CM | POA: Diagnosis not present

## 2023-12-20 DIAGNOSIS — R918 Other nonspecific abnormal finding of lung field: Secondary | ICD-10-CM | POA: Diagnosis not present

## 2023-12-20 LAB — TYPE AND SCREEN
ABO/RH(D): A NEG
Antibody Screen: NEGATIVE

## 2023-12-20 LAB — COMPREHENSIVE METABOLIC PANEL WITH GFR
ALT: 16 U/L (ref 0–44)
AST: 28 U/L (ref 15–41)
Albumin: 3.6 g/dL (ref 3.5–5.0)
Alkaline Phosphatase: 86 U/L (ref 38–126)
Anion gap: 11 (ref 5–15)
BUN: 12 mg/dL (ref 8–23)
CO2: 24 mmol/L (ref 22–32)
Calcium: 8.9 mg/dL (ref 8.9–10.3)
Chloride: 100 mmol/L (ref 98–111)
Creatinine, Ser: 0.99 mg/dL (ref 0.44–1.00)
GFR, Estimated: 58 mL/min — ABNORMAL LOW (ref >60)
Glucose, Bld: 114 mg/dL — ABNORMAL HIGH (ref 70–99)
Potassium: 4.6 mmol/L (ref 3.5–5.1)
Sodium: 135 mmol/L (ref 135–145)
Total Bilirubin: 0.8 mg/dL (ref 0.0–1.2)
Total Protein: 6.6 g/dL (ref 6.5–8.1)

## 2023-12-20 LAB — CBC WITH DIFFERENTIAL/PLATELET
Abs Immature Granulocytes: 0.04 K/uL (ref 0.00–0.07)
Basophils Absolute: 0.1 K/uL (ref 0.0–0.1)
Basophils Relative: 0 %
Eosinophils Absolute: 0 K/uL (ref 0.0–0.5)
Eosinophils Relative: 0 %
HCT: 34.6 % — ABNORMAL LOW (ref 36.0–46.0)
Hemoglobin: 11 g/dL — ABNORMAL LOW (ref 12.0–15.0)
Immature Granulocytes: 0 %
Lymphocytes Relative: 9 %
Lymphs Abs: 1.1 K/uL (ref 0.7–4.0)
MCH: 30.8 pg (ref 26.0–34.0)
MCHC: 31.8 g/dL (ref 30.0–36.0)
MCV: 96.9 fL (ref 80.0–100.0)
Monocytes Absolute: 0.8 K/uL (ref 0.1–1.0)
Monocytes Relative: 7 %
Neutro Abs: 9.9 K/uL — ABNORMAL HIGH (ref 1.7–7.7)
Neutrophils Relative %: 84 %
Platelets: 324 K/uL (ref 150–400)
RBC: 3.57 MIL/uL — ABNORMAL LOW (ref 3.87–5.11)
RDW: 13.6 % (ref 11.5–15.5)
WBC: 11.9 K/uL — ABNORMAL HIGH (ref 4.0–10.5)
nRBC: 0 % (ref 0.0–0.2)

## 2023-12-20 LAB — CK: Total CK: 338 U/L — ABNORMAL HIGH (ref 38–234)

## 2023-12-20 MED ORDER — HYDROMORPHONE HCL 1 MG/ML IJ SOLN: 0.5000 mg | Freq: Once | INTRAMUSCULAR | Status: DC

## 2023-12-20 MED ORDER — ALBUTEROL SULFATE (2.5 MG/3ML) 0.083% IN NEBU: 2.5000 mg | INHALATION_SOLUTION | RESPIRATORY_TRACT | Status: DC | PRN

## 2023-12-20 MED ORDER — HYDROMORPHONE HCL 1 MG/ML IJ SOLN
0.5000 mg | INTRAMUSCULAR | Status: DC | PRN
  Administered 2023-12-20: 0.5 mg via INTRAVENOUS
  Filled 2023-12-20: qty 1

## 2023-12-20 MED ORDER — SODIUM CHLORIDE 0.9% FLUSH
3.0000 mL | Freq: Two times a day (BID) | INTRAVENOUS | Status: DC
  Administered 2023-12-21 – 2023-12-26 (×12): 3 mL via INTRAVENOUS

## 2023-12-20 MED ORDER — ATORVASTATIN CALCIUM 10 MG PO TABS
10.0000 mg | ORAL_TABLET | Freq: Every day | ORAL | Status: DC
  Administered 2023-12-21 – 2023-12-26 (×6): 10 mg via ORAL
  Filled 2023-12-20 (×6): qty 1

## 2023-12-20 MED ORDER — NORTRIPTYLINE HCL 25 MG PO CAPS
150.0000 mg | ORAL_CAPSULE | Freq: Every day | ORAL | Status: DC
  Administered 2023-12-21 – 2023-12-25 (×5): 150 mg via ORAL
  Filled 2023-12-20 (×8): qty 6

## 2023-12-20 MED ORDER — OXYCODONE HCL 5 MG PO TABS: 5.0000 mg | ORAL_TABLET | ORAL | Status: DC | PRN

## 2023-12-20 MED ORDER — HYDROMORPHONE HCL 1 MG/ML IJ SOLN
0.5000 mg | INTRAMUSCULAR | Status: DC | PRN
  Administered 2023-12-21 – 2023-12-22 (×7): 0.5 mg via INTRAVENOUS
  Filled 2023-12-20 (×7): qty 0.5

## 2023-12-20 MED ORDER — OXYCODONE HCL 5 MG PO TABS: 2.5000 mg | ORAL_TABLET | ORAL | Status: DC | PRN

## 2023-12-20 MED ORDER — METHOCARBAMOL 500 MG PO TABS
500.0000 mg | ORAL_TABLET | Freq: Three times a day (TID) | ORAL | Status: DC | PRN
  Administered 2023-12-21 – 2023-12-26 (×5): 500 mg via ORAL
  Filled 2023-12-20 (×5): qty 1

## 2023-12-20 MED ORDER — MELATONIN 3 MG PO TABS: 6.0000 mg | ORAL_TABLET | Freq: Every evening | ORAL | Status: DC | PRN

## 2023-12-20 MED ORDER — HYDROMORPHONE HCL 1 MG/ML IJ SOLN
1.0000 mg | Freq: Once | INTRAMUSCULAR | Status: AC
  Administered 2023-12-20: 1 mg via INTRAVENOUS
  Filled 2023-12-20: qty 1

## 2023-12-20 MED ORDER — ONDANSETRON HCL 4 MG/2ML IJ SOLN
4.0000 mg | Freq: Four times a day (QID) | INTRAMUSCULAR | Status: DC | PRN
  Administered 2023-12-23: 4 mg via INTRAVENOUS
  Filled 2023-12-20: qty 2

## 2023-12-20 MED ORDER — VITAMIN D 25 MCG (1000 UNIT) PO TABS
1000.0000 [IU] | ORAL_TABLET | Freq: Every day | ORAL | Status: DC
  Administered 2023-12-21 – 2023-12-26 (×6): 1000 [IU] via ORAL
  Filled 2023-12-20 (×6): qty 1

## 2023-12-20 MED ORDER — OXYCODONE HCL 5 MG PO TABS: 7.5000 mg | ORAL_TABLET | ORAL | Status: DC | PRN

## 2023-12-20 MED ORDER — ACETAMINOPHEN 500 MG PO TABS
1000.0000 mg | ORAL_TABLET | Freq: Four times a day (QID) | ORAL | Status: DC | PRN
  Administered 2023-12-24: 1000 mg via ORAL
  Filled 2023-12-20 (×2): qty 2

## 2023-12-20 MED ORDER — PAROXETINE HCL 20 MG PO TABS
40.0000 mg | ORAL_TABLET | Freq: Every day | ORAL | Status: DC
  Administered 2023-12-21 – 2023-12-26 (×6): 40 mg via ORAL
  Filled 2023-12-20 (×6): qty 2

## 2023-12-20 MED ORDER — OXYCODONE HCL 5 MG PO TABS
10.0000 mg | ORAL_TABLET | ORAL | Status: DC | PRN
  Administered 2023-12-21: 10 mg via ORAL
  Filled 2023-12-20: qty 2

## 2023-12-20 MED ORDER — SODIUM CHLORIDE 0.9 % IV BOLUS
1000.0000 mL | Freq: Once | INTRAVENOUS | Status: AC
  Administered 2023-12-20: 1000 mL via INTRAVENOUS

## 2023-12-20 MED ORDER — BUSPIRONE HCL 5 MG PO TABS: 5.0000 mg | ORAL_TABLET | Freq: Three times a day (TID) | ORAL | Status: DC | PRN

## 2023-12-20 MED ORDER — PANTOPRAZOLE SODIUM 40 MG PO TBEC
40.0000 mg | DELAYED_RELEASE_TABLET | Freq: Two times a day (BID) | ORAL | Status: DC
  Administered 2023-12-21 – 2023-12-26 (×12): 40 mg via ORAL
  Filled 2023-12-20 (×12): qty 1

## 2023-12-20 MED ORDER — POLYETHYLENE GLYCOL 3350 17 G PO PACK
17.0000 g | PACK | Freq: Every day | ORAL | Status: DC | PRN
  Administered 2023-12-24: 17 g via ORAL
  Filled 2023-12-20: qty 1

## 2023-12-20 NOTE — Progress Notes (Signed)
 Orthopedics consulted for patient's femur fracture. This will require operative fixation. Request patient to be admitted to the Hospitalist service. NPO at midnight. Formal consultation to follow.   Javione Gunawan, PA-C

## 2023-12-20 NOTE — ED Notes (Signed)
 Difficulty obtaining labs. Phlebotomy to see patient.

## 2023-12-20 NOTE — ED Triage Notes (Signed)
 Pt bib [facility] coming frm home. EMS states patient had fall last night around 10pm and was on floor in bathroom until 1330 today when she was found by family. Pt unable to move left leg and has hip pain. Pt does have hx of surgery to left femur. Pt arrives on 2L [place] after SpO2 dropped after fentanyl  given by EMS. No LOC, patient not on blood thinners. GCS 15.  EMS VS: 148/92 94 HR 22 RR 94%  22 r hand 100 mcg fentanyl 

## 2023-12-20 NOTE — ED Notes (Signed)
 IV accidentally removed by patient while IV fluids going. MD at bedside and aware. IV team consult to be placed. Fluid bolus stopped at .

## 2023-12-20 NOTE — ED Notes (Addendum)
 Difficulty obtaining repeat labs. Phlebotomy asked to see patient again. MD notified.

## 2023-12-20 NOTE — ED Provider Notes (Signed)
 Stuck in bathroom overnight after a fall. Intertrochanteric fracture left femur. F/U CT imaging and labs. Admit Physical Exam  BP 99/71   Pulse 70   Temp 99.2 F (37.3 C) (Oral)   Resp 12   Ht 5' 3.5 (1.613 m)   Wt 59 kg   SpO2 95%   BMI 22.67 kg/m   Physical Exam  Procedures  Procedures  ED Course / MDM    Medical Decision Making Amount and/or Complexity of Data Reviewed Labs: ordered. Radiology: ordered.  Risk Prescription drug management. Decision regarding hospitalization.  Aleck PA Beverley Millman reports that intertrochanteric fractures are not very amenable to Buck's traction and trying to straighten the leg at this time may not have much utility.  She reports have the patient n.p.o. after midnight and she will be posted for surgery in the morning.  In the meantime can administer pain control.  Patient is alert with clear mental status.  She is advised of her fracture and plan for surgery in the morning.  I asked the patient if she had family.  She reports she has a daughter in [place] but not much family.  I asked the patient if she wanted to update her daughter.  She reports she let her daughter know she was in the emergency department but she did not want to update her about the hip fracture and the impending surgery.  She said she would just let it be a surprise.  Patient's mental status is clear and she has full understanding of her injury and plan for surgery.  Patient is getting some pain control with Dilaudid .  It still hurts obviously with any movement.  Patient remains very alert and does not show any sign of sedation from her dose of Dilaudid .  Will administer another half milligram.  Consult: Dr. Segars for admission        Armenta Canning, MD 12/20/23 2203

## 2023-12-20 NOTE — ED Notes (Signed)
 Phlebotomy asked to see patient to obtain labs.
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
## 2023-12-20 NOTE — ED Notes (Signed)
 ED Provider at bedside.

## 2023-12-20 NOTE — ED Provider Notes (Signed)
 [place] EMERGENCY DEPARTMENT AT [facility] Provider Note   CSN: [id number] Arrival date & time: 12/20/23  1434     Patient presents with: Tiffany [name] is a 78 y.o. female.   78 year old female presenting to the emergency department after a fall yesterday causing leg pain.  She reports standing up from using the toilet when her leg gave out.  She states she was on the floor overnight until someone found her.  She is unsure if she hit her head but states she is not on blood thinners.  She has an obvious deformity to her left leg.  She denies any numbness or tingling in the left lower extremity.   Fall       Prior to Admission medications   Medication Sig Start Date End Date Taking? Authorizing Provider  amLODipine  (NORVASC ) 2.5 MG tablet Take 1 tablet (2.5 mg total) by mouth daily. FOR RAYNAUDS 08/11/23   Bedsole, Andriea Hasegawa E, MD  atorvastatin  (LIPITOR) 10 MG tablet Take 1 tablet (10 mg total) by mouth daily. 07/20/23   Jerilynn Lamarr HERO, NP  busPIRone  (BUSPAR ) 5 MG tablet Take 1 tablet (5 mg total) by mouth 3 (three) times daily as needed. 10/26/23     Cholecalciferol  (VITAMIN D3 PO) Take 1 tablet by mouth 3 (three) times a week.    [provider]  HYDROcodone -acetaminophen  (NORCO) 10-325 MG tablet Take 1 tablet by mouth 4 (four) times daily as needed for pain. 11/24/23     lidocaine  4 % Place 1 patch onto the skin daily. 11/15/23   Bedsole, Ed Rayson E, MD  methocarbamol  (ROBAXIN ) 500 MG tablet Take 1 tablet (500 mg total) by mouth 4 (four) times daily as needed. 10/26/23     nortriptyline  (PAMELOR ) 50 MG capsule Take 3 capsules (150 mg total) by mouth at bedtime. 08/22/23   Bedsole, Greig BRAVO, MD  pantoprazole  (PROTONIX ) 40 MG tablet Take 1 tablet (40 mg) twice a day before a meal for 6 weeks, then 1 tablet (40 mg) once daily indefinitely 09/06/23   Rai, Nydia POUR, MD  PARoxetine  (PAXIL ) 40 MG tablet Take 1/2-1 tablet (20-40 mg total) by mouth daily as directed 08/01/23      PARoxetine  (PAXIL ) 40 MG tablet Take 1/2-1 tablet (20-40 mg total) by mouth daily as directed. 10/26/23     sennosides-docusate sodium  (SENOKOT-S) 8.6-50 MG tablet Take 2 tablets by mouth daily. 10/20/20   Brown, Blaine K, PA-C  vitamin B-12 (CYANOCOBALAMIN ) 1000 MCG tablet Take 1,000 mcg by mouth daily.    [provider]    Allergies: Aspirin, Latex, Allegra [fexofenadine], Nsaids, Tramadol, and Zolpidem tartrate    Review of Systems  All other systems reviewed and are negative.   Updated Vital Signs BP 99/71   Pulse 70   Temp 99.2 F (37.3 C) (Oral)   Resp 12   Ht 5' 3.5 (1.613 m)   Wt 59 kg   SpO2 95%   BMI 22.67 kg/m   Physical Exam Vitals and nursing note reviewed.  Constitutional:      General: She is not in acute distress. HENT:     Head: Normocephalic and atraumatic.     Mouth/Throat:     Pharynx: Oropharynx is clear.  Eyes:     Conjunctiva/sclera: Conjunctivae normal.  Cardiovascular:     Rate and Rhythm: Normal rate.     Pulses: Normal pulses.  Pulmonary:     Effort: Pulmonary effort is normal.  Abdominal:  General: Abdomen is flat.     Palpations: Abdomen is soft.  Musculoskeletal:     Cervical back: Neck supple.     Comments: Left leg thigh deformity Good distal pulses and perfusion Normal sensation  Skin:    General: Skin is warm.     Capillary Refill: Capillary refill takes 2 to 3 seconds.  Neurological:     Mental Status: She is alert. Mental status is at baseline.     (all labs ordered are listed, but only abnormal results are displayed) Labs Reviewed  COMPREHENSIVE METABOLIC PANEL WITH GFR  CBC WITH DIFFERENTIAL/PLATELET  URINALYSIS, ROUTINE W REFLEX MICROSCOPIC    EKG: None  Radiology: DG Femur Min 2 Views Left Result Date: 12/20/2023 CLINICAL DATA:  fall; 809823 Fall 190176. EXAM: LEFT FEMUR 2 VIEWS; PELVIS - 1-2 VIEW COMPARISON:  None Available. FINDINGS: There is comminuted and angulated intertrochanteric fracture  of the proximal left femur. No other acute fracture or dislocation. No aggressive osseous lesion. Visualized sacral arcuate lines are unremarkable. Unremarkable symphysis pubis. Mild degenerative changes of left hip joint. Left knee joint is not well evaluated on this exam. Metallic hardware noted in the proximal left tibia. No radiopaque foreign bodies. IMPRESSION: *Comminuted and angulated intertrochanteric fracture of the proximal left femur. Electronically Signed   By: Ree Molt M.D.   On: 12/20/2023 16:23   DG Pelvis 1-2 Views Result Date: 12/20/2023 CLINICAL DATA:  fall; 809823 Fall 190176. EXAM: LEFT FEMUR 2 VIEWS; PELVIS - 1-2 VIEW COMPARISON:  None Available. FINDINGS: There is comminuted and angulated intertrochanteric fracture of the proximal left femur. No other acute fracture or dislocation. No aggressive osseous lesion. Visualized sacral arcuate lines are unremarkable. Unremarkable symphysis pubis. Mild degenerative changes of left hip joint. Left knee joint is not well evaluated on this exam. Metallic hardware noted in the proximal left tibia. No radiopaque foreign bodies. IMPRESSION: *Comminuted and angulated intertrochanteric fracture of the proximal left femur. Electronically Signed   By: Ree Molt M.D.   On: 12/20/2023 16:23   DG Chest 1 View Result Date: 12/20/2023 CLINICAL DATA:  Fall. EXAM: CHEST  1 VIEW COMPARISON:  09/04/2023. FINDINGS: Bilateral lung fields are clear. Bilateral costophrenic angles are clear. Normal cardio-mediastinal silhouette. No acute osseous abnormalities. Right shoulder arthroplasty partially seen. The soft tissues are within normal limits. IMPRESSION: No active disease. Electronically Signed   By: Ree Molt M.D.   On: 12/20/2023 16:20     Procedures   Medications Ordered in the ED - No data to display                                  Medical Decision Making 78 year old female presenting after a fall at home with an obvious left thigh  deformity.  Patient likely has a proximal femur fracture.  She is unsure if she hit her head, so we have added on a CT scan of the head and cervical spine to evaluate for any underlying injuries.  She is not on blood thinners.  Lab work ordered since she reports being down for an extended period of time after the fall.  I have no other signs of injuries from this fall.  X-rays of the chest, hips, and femur ordered. X-ray of the femur shows proximal femur fracture with angulation.  Patient signed out to Dr. Armenta for further follow-up on her lab/imaging  Amount and/or Complexity of Data Reviewed Labs: ordered. Radiology: ordered.  Final diagnoses:  None    ED Discharge Orders     None          Joon Pohle, DO 12/20/23 1653
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
## 2023-12-21 ENCOUNTER — Inpatient Hospital Stay (HOSPITAL_COMMUNITY): Admitting: Anesthesiology

## 2023-12-21 ENCOUNTER — Encounter (HOSPITAL_COMMUNITY): Payer: Self-pay | Admitting: Internal Medicine

## 2023-12-21 ENCOUNTER — Other Ambulatory Visit (HOSPITAL_COMMUNITY): Payer: Self-pay

## 2023-12-21 ENCOUNTER — Inpatient Hospital Stay (HOSPITAL_COMMUNITY)

## 2023-12-21 ENCOUNTER — Other Ambulatory Visit: Payer: Self-pay

## 2023-12-21 ENCOUNTER — Encounter (HOSPITAL_COMMUNITY)
Admission: EM | Disposition: A | Discharge status: Skilled Nursing Facility | Payer: Self-pay | Source: Home / Self Care | Attending: Internal Medicine

## 2023-12-21 DIAGNOSIS — S72142A Displaced intertrochanteric fracture of left femur, initial encounter for closed fracture: Secondary | ICD-10-CM

## 2023-12-21 HISTORY — PX: INTRAMEDULLARY (IM) NAIL INTERTROCHANTERIC: SHX5875

## 2023-12-21 LAB — CBC
HCT: 32.6 % — ABNORMAL LOW (ref 36.0–46.0)
Hemoglobin: 10.5 g/dL — ABNORMAL LOW (ref 12.0–15.0)
MCH: 30.9 pg (ref 26.0–34.0)
MCHC: 32.2 g/dL (ref 30.0–36.0)
MCV: 95.9 fL (ref 80.0–100.0)
Platelets: 345 K/uL (ref 150–400)
RBC: 3.4 MIL/uL — ABNORMAL LOW (ref 3.87–5.11)
RDW: 13.7 % (ref 11.5–15.5)
WBC: 12.3 K/uL — ABNORMAL HIGH (ref 4.0–10.5)
nRBC: 0 % (ref 0.0–0.2)

## 2023-12-21 LAB — PHOSPHORUS: Phosphorus: 3.1 mg/dL (ref 2.5–4.6)

## 2023-12-21 LAB — BASIC METABOLIC PANEL WITH GFR
Anion gap: 12 (ref 5–15)
BUN: 11 mg/dL (ref 8–23)
CO2: 24 mmol/L (ref 22–32)
Calcium: 8.8 mg/dL — ABNORMAL LOW (ref 8.9–10.3)
Chloride: 102 mmol/L (ref 98–111)
Creatinine, Ser: 0.88 mg/dL (ref 0.44–1.00)
GFR, Estimated: >60 mL/min (ref >60)
Glucose, Bld: 118 mg/dL — ABNORMAL HIGH (ref 70–99)
Potassium: 3.9 mmol/L (ref 3.5–5.1)
Sodium: 138 mmol/L (ref 135–145)

## 2023-12-21 LAB — POCT I-STAT, CHEM 8
BUN: 11 mg/dL (ref 8–23)
Calcium, Ion: 1.17 mmol/L (ref 1.15–1.40)
Chloride: 99 mmol/L (ref 98–111)
Creatinine, Ser: 0.8 mg/dL (ref 0.44–1.00)
Glucose, Bld: 135 mg/dL — ABNORMAL HIGH (ref 70–99)
HCT: 31 % — ABNORMAL LOW (ref 36.0–46.0)
Hemoglobin: 10.5 g/dL — ABNORMAL LOW (ref 12.0–15.0)
Potassium: 4 mmol/L (ref 3.5–5.1)
Sodium: 137 mmol/L (ref 135–145)
TCO2: 26 mmol/L (ref 22–32)

## 2023-12-21 LAB — HEPATIC FUNCTION PANEL
ALT: 16 U/L (ref 0–44)
AST: 27 U/L (ref 15–41)
Albumin: 3.7 g/dL (ref 3.5–5.0)
Alkaline Phosphatase: 90 U/L (ref 38–126)
Bilirubin, Direct: <0.1 mg/dL (ref 0.0–0.2)
Total Bilirubin: 0.6 mg/dL (ref 0.0–1.2)
Total Protein: 6.8 g/dL (ref 6.5–8.1)

## 2023-12-21 LAB — PROTIME-INR
INR: 1 (ref 0.8–1.2)
Prothrombin Time: 13.6 s (ref 11.4–15.2)

## 2023-12-21 LAB — SURGICAL PCR SCREEN
MRSA, PCR: NEGATIVE
Staphylococcus aureus: NEGATIVE

## 2023-12-21 LAB — VITAMIN D 25 HYDROXY (VIT D DEFICIENCY, FRACTURES): Vit D, 25-Hydroxy: 33.27 ng/mL (ref 30–100)

## 2023-12-21 LAB — MAGNESIUM: Magnesium: 1.9 mg/dL (ref 1.7–2.4)

## 2023-12-21 SURGERY — FIXATION, FRACTURE, INTERTROCHANTERIC, WITH INTRAMEDULLARY ROD
Anesthesia: General | Laterality: Left

## 2023-12-21 MED ORDER — OXYCODONE HCL 5 MG PO TABS
5.0000 mg | ORAL_TABLET | ORAL | Status: DC | PRN
  Administered 2023-12-24 (×2): 5 mg via ORAL
  Administered 2023-12-25: 10 mg via ORAL
  Administered 2023-12-25: 5 mg via ORAL
  Administered 2023-12-25: 10 mg via ORAL
  Administered 2023-12-26: 5 mg via ORAL
  Filled 2023-12-21: qty 2
  Filled 2023-12-21 (×2): qty 1
  Filled 2023-12-21 (×3): qty 2
  Filled 2023-12-21 (×2): qty 1
  Filled 2023-12-21: qty 2

## 2023-12-21 MED ORDER — ONDANSETRON HCL 4 MG/2ML IJ SOLN
INTRAMUSCULAR | Status: DC | PRN
  Administered 2023-12-21: 4 mg via INTRAVENOUS

## 2023-12-21 MED ORDER — PHENYLEPHRINE 80 MCG/ML (10ML) SYRINGE FOR IV PUSH (FOR BLOOD PRESSURE SUPPORT)
PREFILLED_SYRINGE | INTRAVENOUS | Status: DC | PRN
  Administered 2023-12-21: 80 ug via INTRAVENOUS

## 2023-12-21 MED ORDER — SUGAMMADEX SODIUM 200 MG/2ML IV SOLN
INTRAVENOUS | Status: AC
  Filled 2023-12-21: qty 2

## 2023-12-21 MED ORDER — DOCUSATE SODIUM 100 MG PO CAPS
100.0000 mg | ORAL_CAPSULE | Freq: Two times a day (BID) | ORAL | Status: DC
  Administered 2023-12-21 – 2023-12-26 (×11): 100 mg via ORAL
  Filled 2023-12-21 (×11): qty 1

## 2023-12-21 MED ORDER — CEFAZOLIN SODIUM-DEXTROSE 2-4 GM/100ML-% IV SOLN
2.0000 g | INTRAVENOUS | Status: AC
  Administered 2023-12-21: 2 g via INTRAVENOUS
  Filled 2023-12-21: qty 100

## 2023-12-21 MED ORDER — CEFAZOLIN SODIUM-DEXTROSE 2-4 GM/100ML-% IV SOLN
2.0000 g | Freq: Four times a day (QID) | INTRAVENOUS | Status: AC
  Administered 2023-12-21 (×2): 2 g via INTRAVENOUS
  Filled 2023-12-21 (×2): qty 100

## 2023-12-21 MED ORDER — TRANEXAMIC ACID-NACL 1000-0.7 MG/100ML-% IV SOLN
1000.0000 mg | Freq: Once | INTRAVENOUS | Status: AC
  Administered 2023-12-21: 1000 mg via INTRAVENOUS
  Filled 2023-12-21: qty 100

## 2023-12-21 MED ORDER — FENTANYL CITRATE (PF) 250 MCG/5ML IJ SOLN
INTRAMUSCULAR | Status: DC | PRN
  Administered 2023-12-21 (×3): 50 ug via INTRAVENOUS

## 2023-12-21 MED ORDER — DEXAMETHASONE SODIUM PHOSPHATE 10 MG/ML IJ SOLN
INTRAMUSCULAR | Status: AC
  Filled 2023-12-21: qty 1

## 2023-12-21 MED ORDER — ROCURONIUM BROMIDE 100 MG/10ML IV SOLN
INTRAVENOUS | Status: DC | PRN
  Administered 2023-12-21: 50 mg via INTRAVENOUS

## 2023-12-21 MED ORDER — METOCLOPRAMIDE HCL 5 MG PO TABS: 5.0000 mg | ORAL_TABLET | Freq: Three times a day (TID) | ORAL | Status: DC | PRN

## 2023-12-21 MED ORDER — 0.9 % SODIUM CHLORIDE (POUR BTL) OPTIME
TOPICAL | Status: DC | PRN
  Administered 2023-12-21: 1000 mL

## 2023-12-21 MED ORDER — PHENOL 1.4 % MT LIQD: 1.0000 | OROMUCOSAL | Status: DC | PRN

## 2023-12-21 MED ORDER — ROCURONIUM BROMIDE 10 MG/ML (PF) SYRINGE
PREFILLED_SYRINGE | INTRAVENOUS | Status: AC
  Filled 2023-12-21: qty 10

## 2023-12-21 MED ORDER — LIDOCAINE 2% (20 MG/ML) 5 ML SYRINGE
INTRAMUSCULAR | Status: AC
  Filled 2023-12-21: qty 10

## 2023-12-21 MED ORDER — PROPOFOL 10 MG/ML IV BOLUS
INTRAVENOUS | Status: AC
  Filled 2023-12-21: qty 20

## 2023-12-21 MED ORDER — PHENYLEPHRINE HCL-NACL 20-0.9 MG/250ML-% IV SOLN
INTRAVENOUS | Status: DC | PRN
  Administered 2023-12-21: 25 ug/min via INTRAVENOUS

## 2023-12-21 MED ORDER — POVIDONE-IODINE 10 % EX SWAB
2.0000 | Freq: Once | CUTANEOUS | Status: AC
  Administered 2023-12-21: 2 via TOPICAL

## 2023-12-21 MED ORDER — ONDANSETRON HCL 4 MG/2ML IJ SOLN
INTRAMUSCULAR | Status: AC
  Filled 2023-12-21: qty 2

## 2023-12-21 MED ORDER — ACETAMINOPHEN 325 MG PO TABS
325.0000 mg | ORAL_TABLET | Freq: Four times a day (QID) | ORAL | Status: DC | PRN
  Administered 2023-12-22 (×2): 325 mg via ORAL
  Filled 2023-12-21 (×2): qty 2

## 2023-12-21 MED ORDER — PROPOFOL 10 MG/ML IV BOLUS
INTRAVENOUS | Status: DC | PRN
  Administered 2023-12-21: 70 mg via INTRAVENOUS

## 2023-12-21 MED ORDER — SUGAMMADEX SODIUM 200 MG/2ML IV SOLN
INTRAVENOUS | Status: DC | PRN
  Administered 2023-12-21: 100 mg via INTRAVENOUS

## 2023-12-21 MED ORDER — PHENYLEPHRINE 80 MCG/ML (10ML) SYRINGE FOR IV PUSH (FOR BLOOD PRESSURE SUPPORT)
PREFILLED_SYRINGE | INTRAVENOUS | Status: AC
  Filled 2023-12-21: qty 10

## 2023-12-21 MED ORDER — ORAL CARE MOUTH RINSE: 15.0000 mL | Freq: Once | OROMUCOSAL | Status: AC

## 2023-12-21 MED ORDER — LACTATED RINGERS IV SOLN: INTRAVENOUS | Status: DC

## 2023-12-21 MED ORDER — ONDANSETRON HCL 4 MG/2ML IJ SOLN: 4.0000 mg | Freq: Once | INTRAMUSCULAR | Status: DC | PRN

## 2023-12-21 MED ORDER — FENTANYL CITRATE (PF) 250 MCG/5ML IJ SOLN
INTRAMUSCULAR | Status: AC
  Filled 2023-12-21: qty 5

## 2023-12-21 MED ORDER — OXYCODONE HCL 5 MG/5ML PO SOLN: 5.0000 mg | Freq: Once | ORAL | Status: DC | PRN

## 2023-12-21 MED ORDER — DEXAMETHASONE SODIUM PHOSPHATE 10 MG/ML IJ SOLN
INTRAMUSCULAR | Status: DC | PRN
  Administered 2023-12-21: 10 mg via INTRAVENOUS

## 2023-12-21 MED ORDER — OXYCODONE HCL 5 MG PO TABS
10.0000 mg | ORAL_TABLET | ORAL | Status: DC | PRN
  Administered 2023-12-21 – 2023-12-24 (×7): 10 mg via ORAL
  Filled 2023-12-21 (×4): qty 2

## 2023-12-21 MED ORDER — EPHEDRINE 5 MG/ML INJ
INTRAVENOUS | Status: AC
  Filled 2023-12-21: qty 5

## 2023-12-21 MED ORDER — LIDOCAINE HCL (CARDIAC) PF 100 MG/5ML IV SOSY
PREFILLED_SYRINGE | INTRAVENOUS | Status: DC | PRN
  Administered 2023-12-21: 100 mg via INTRATRACHEAL

## 2023-12-21 MED ORDER — CHLORHEXIDINE GLUCONATE 4 % EX SOLN: 60.0000 mL | Freq: Once | CUTANEOUS | Status: DC

## 2023-12-21 MED ORDER — MENTHOL 3 MG MT LOZG: 1.0000 | LOZENGE | OROMUCOSAL | Status: DC | PRN

## 2023-12-21 MED ORDER — CHLORHEXIDINE GLUCONATE 0.12 % MT SOLN
15.0000 mL | Freq: Once | OROMUCOSAL | Status: AC
  Administered 2023-12-21: 15 mL via OROMUCOSAL
  Filled 2023-12-21: qty 15

## 2023-12-21 MED ORDER — GLYCOPYRROLATE PF 0.2 MG/ML IJ SOSY
PREFILLED_SYRINGE | INTRAMUSCULAR | Status: AC
  Filled 2023-12-21: qty 1

## 2023-12-21 MED ORDER — ACETAMINOPHEN 10 MG/ML IV SOLN: 1000.0000 mg | Freq: Once | INTRAVENOUS | Status: DC | PRN

## 2023-12-21 MED ORDER — FENTANYL CITRATE (PF) 100 MCG/2ML IJ SOLN: 25.0000 ug | INTRAMUSCULAR | Status: DC | PRN

## 2023-12-21 MED ORDER — METOCLOPRAMIDE HCL 5 MG/ML IJ SOLN: 5.0000 mg | Freq: Three times a day (TID) | INTRAMUSCULAR | Status: DC | PRN

## 2023-12-21 MED ORDER — OXYCODONE HCL 5 MG PO TABS: 5.0000 mg | ORAL_TABLET | Freq: Once | ORAL | Status: DC | PRN

## 2023-12-21 SURGICAL SUPPLY — 41 items
BAG COUNTER SPONGE SURGICOUNT (BAG) ×1 IMPLANT
BIT DRILL INTERTAN LAG SCREW (BIT) IMPLANT
BIT DRILL SHORT 4.0 (BIT) IMPLANT
BLADE SURG 15 STRL LF DISP TIS (BLADE) ×1 IMPLANT
BNDG GAUZE DERMACEA FLUFF 4 (GAUZE/BANDAGES/DRESSINGS) ×1 IMPLANT
COVER PERINEAL POST (MISCELLANEOUS) ×1 IMPLANT
COVER SURGICAL LIGHT HANDLE (MISCELLANEOUS) ×1 IMPLANT
DRAPE STERI IOBAN 125X83 (DRAPES) ×1 IMPLANT
DRESSING MEPILEX FLEX 4X4 (GAUZE/BANDAGES/DRESSINGS) IMPLANT
DRSG AQUACEL AG ADV 3.5X 6 (GAUZE/BANDAGES/DRESSINGS) IMPLANT
DRSG AQUACEL AG ADV 3.5X10 (GAUZE/BANDAGES/DRESSINGS) IMPLANT
DRSG MEPILEX POST OP 4X8 (GAUZE/BANDAGES/DRESSINGS) ×1 IMPLANT
DURAPREP 26ML APPLICATOR (WOUND CARE) ×1 IMPLANT
ELECTRODE REM PT RTRN 9FT ADLT (ELECTROSURGICAL) ×1 IMPLANT
FACESHIELD WRAPAROUND (MASK) ×1 IMPLANT
FACESHIELD WRAPAROUND OR TEAM (MASK) ×1 IMPLANT
GAUZE PAD ABD 8X10 STRL (GAUZE/BANDAGES/DRESSINGS) ×2 IMPLANT
GAUZE XEROFORM 5X9 LF (GAUZE/BANDAGES/DRESSINGS) ×1 IMPLANT
GLOVE BIO SURGEON STRL SZ8 (GLOVE) ×1 IMPLANT
GLOVE BIOGEL PI IND STRL 8 (GLOVE) ×1 IMPLANT
GLOVE ORTHO TXT STRL SZ7.5 (GLOVE) ×1 IMPLANT
GOWN STRL REUS W/ TWL LRG LVL3 (GOWN DISPOSABLE) ×1 IMPLANT
GOWN STRL REUS W/ TWL XL LVL3 (GOWN DISPOSABLE) ×2 IMPLANT
KIT BASIN OR (CUSTOM PROCEDURE TRAY) ×1 IMPLANT
KIT TURNOVER KIT B (KITS) ×1 IMPLANT
MANIFOLD NEPTUNE II (INSTRUMENTS) ×1 IMPLANT
NAIL TRIGEN LEFT 10X38-125 (Nail) IMPLANT
NS IRRIG 1000ML POUR BTL (IV SOLUTION) ×1 IMPLANT
PACK GENERAL/GYN (CUSTOM PROCEDURE TRAY) ×1 IMPLANT
PAD ARMBOARD POSITIONER FOAM (MISCELLANEOUS) ×2 IMPLANT
PAD CAST 4YDX4 CTTN HI CHSV (CAST SUPPLIES) ×2 IMPLANT
PIN GUIDE 3.2X343MM (PIN) IMPLANT
SCREW LAG COMPR KIT 90/85 (Screw) IMPLANT
SCREW TRIGEN LOW PROF 5.0X40 (Screw) IMPLANT
SCREW TRIGEN LOW PROF 5.0X45 (Screw) IMPLANT
STAPLER SKIN PROX 35W (STAPLE) ×1 IMPLANT
SUT VIC AB 0 CT1 27XBRD ANBCTR (SUTURE) ×2 IMPLANT
SUT VIC AB 2-0 CT1 TAPERPNT 27 (SUTURE) ×2 IMPLANT
TOWEL GREEN STERILE (TOWEL DISPOSABLE) ×1 IMPLANT
TOWEL GREEN STERILE FF (TOWEL DISPOSABLE) ×1 IMPLANT
WATER STERILE IRR 1000ML POUR (IV SOLUTION) ×1 IMPLANT

## 2023-12-21 NOTE — Plan of Care (Signed)

## 2023-12-21 NOTE — Progress Notes (Signed)
 PROGRESS NOTE    Tiffany [name]  FMW:[id number] DOB: 1945/07/15 DOA: 12/20/2023 PCP: Avelina Greig BRAVO, MD   Brief Narrative:  Tiffany [name] is a 78 y.o. female with hx of hypertension, hyperlipidemia, gastritis/esophagitis, esophageal stenosis, anxiety, who presented after a ground-level fall.  She reports that she was standing at the end of the night and she slipped and fell.  Her only complaint is pain in the left hip denies other injuries. Orthopedic Surgery was consulted and took the patient for ORIF of the Left Complex Proximal Femur Fx and she is POD 0.   Assessment and Plan:  Left intertrochanteric hip fracture Clinical diagnosis Osteoporosis Ground-level fall -Orthopedic surgery consulted and took the pateint for ORIF -N.p.o. after midnight  -DVT prophylaxis to be readdressed postop per Ortho; SCD for now  -Tele monitoring periop -PT/ OT eval postop ordered   -Pain mgmt: Tylenol  prn for mild, methocarbamol  as needed spasm, oxycodone  5/7.5 mg p.o. every 4 hours prn for moderate/severe, Dilaudid  0.5 mg IV every 4 hours prn for breakthrough -Check vitamin D  in the AM, start vitamin D3 1000 IU daily -Will need follow-up with PCP for further treatment of osteoporosis -She has a Leukocytosis (WBC went from 11.9 -> 12.3) and likely reactive.  -Bowel regimen initiated. CTM and further care per Orthopedic Sx   ?  Short-term memory deficit: After discussing initial presentation and plan she was very unclear about these details. However fully oriented. Would be cautious with consent for surgery and may need to contact her daughter.  I attempted to call and talk to her daughter over the phone but no answer x 2 went straight to voicemail.  Essential Hypertension: Not on antihypertensives at time of admission. CTM BP per Protocol. Last BP reading was a little soft at 98/72  Hyperlipidemia: C/w Home Atorvastatin  10 mg po Daily   History esophageal stricture: Noted  Mood  Disorder/Anxiety/Insomnia: C/w home Paroxetine  40 mg po Daily, Buspirone  5 mg TIDprn Anxiety, Nortriptyline  nightly and Melatonin 6 mg po qHS  Normocytic Anemia: Hgb/Hct Trend:  Recent Labs  Lab 12/20/23 2143 12/21/23 0734 12/21/23 1411  HGB 11.0* 10.5* 10.5*  HCT 34.6* 32.6* 31.0*  MCV 96.9 95.9  --   -Expect a Post-Operative Drop. Check Anemia Panel in the AM. CTM for S/Sx of Bleeding; No overt bleeding noted. Repeat CBC in the AM  Gastritis/Esophagitis/GERD/GI Prophylaxis: Continue home PPI w/ Pantoprazole  40 mg po BID   DVT prophylaxis: SCDs Start: 12/21/23 1434 SCDs Start: 12/20/23 2221    Code Status: Limited: Do not attempt resuscitation (DNR) -DNR-LIMITED -Do Not Intubate/DNI  Family Communication: No family present @ bedside   Disposition Plan:  Level of care: Telemetry Medical Status is: Inpatient Remains inpatient appropriate because: Needs further clinical improvement and clearance by the specialist as well as evaluation by PT OT   Consultants:  Orthopedic Sugery  Procedures:  Open reduction/intra fixation of left complex proximal femur fracture   Antimicrobials:  Anti-infectives (From admission, onward)    Start     Dose/Rate Route Frequency Ordered Stop   12/21/23 1630  ceFAZolin  (ANCEF ) IVPB 2g/100 mL premix        2 g 200 mL/hr over 30 Minutes Intravenous Every 6 hours 12/21/23 1433 12/22/23 0429   12/21/23 1015  ceFAZolin  (ANCEF ) IVPB 2g/100 mL premix        2 g 200 mL/hr over 30 Minutes Intravenous On call to O.R. 12/21/23 0916 12/21/23 1118       Subjective: Seen and  examined at bedside and she is just out of the anesthesia PACU department and she is still little confused.  States that she did not have much pain currently.  Has mitten restraints on but she is try to pull off.  Denies any chest pain or shortness of breath.  No other concerns or complaints at this time.  Objective: Vitals:   12/21/23 1345 12/21/23 1400 12/21/23 1415 12/21/23 1433   BP: 120/71  118/71 98/72  Pulse: 84 79 82 62  Resp: 11 16 15 16   Temp:   98.5 F (36.9 C) 98.1 F (36.7 C)  TempSrc:    Oral  SpO2: 93% 93% 93%   Weight:      Height:        Intake/Output Summary (Last 24 hours) at 12/21/2023 1620 Last data filed at 12/21/2023 1504 Gross per 24 hour  Intake 1464.99 ml  Output 1025 ml  Net 439.99 ml   Filed Weights   12/20/23 1443 12/21/23 0928  Weight: 59 kg 58.9 kg   Examination: Physical Exam:  Constitutional: Elderly chronically ill-appearing Caucasian female in NAD but a little confused postoperatively  Respiratory: Slightly diminished to auscultation bilaterally with some coarse breath sounds, no wheezing, rales, rhonchi or crackles. Normal respiratory effort and patient is not tachypenic. No accessory muscle use.  Unlabored breathing Cardiovascular: RRR, no murmurs / rubs / gallops. S1 and S2 auscultated. No extremity edema noted Abdomen: Soft, non-tender, non-distended.  Normal bowel sounds positive.  GU: Deferred. Musculoskeletal: No clubbing / cyanosis of digits/nails. No joint deformity upper and lower extremities.  Skin: No rashes, lesions, ulcers on a limited skin evaluation and incisions are covered. No induration; Warm and dry.  Neurologic: CN 2-12 grossly intact with no focal deficits  Romberg sign cerebellar reflexes not assessed.  Psychiatric: A little confused but awake and alert  Data Reviewed: I have personally reviewed following labs and imaging studies  CBC: Recent Labs  Lab 12/20/23 2143 12/21/23 0734 12/21/23 1411  WBC 11.9* 12.3*  --   NEUTROABS 9.9*  --   --   HGB 11.0* 10.5* 10.5*  HCT 34.6* 32.6* 31.0*  MCV 96.9 95.9  --   PLT 324 345  --    Basic Metabolic Panel: Recent Labs  Lab 12/20/23 1903 12/21/23 0734 12/21/23 1411  NA 135 138 137  K 4.6 3.9 4.0  CL 100 102 99  CO2 24 24  --   GLUCOSE 114* 118* 135*  BUN 12 11 11   CREATININE 0.99 0.88 0.80  CALCIUM  8.9 8.8*  --   MG  --  1.9  --    PHOS  --  3.1  --    GFR: Estimated Creatinine Clearance: 48.2 mL/min (by C-G formula based on SCr of 0.8 mg/dL). Liver Function Tests: Recent Labs  Lab 12/20/23 1903 12/21/23 0900  AST 28 27  ALT 16 16  ALKPHOS 86 90  BILITOT 0.8 0.6  PROT 6.6 6.8  ALBUMIN  3.6 3.7   No results for input(s): LIPASE, AMYLASE in the last 168 hours. No results for input(s): AMMONIA in the last 168 hours. Coagulation Profile: Recent Labs  Lab 12/21/23 0734  INR 1.0   Cardiac Enzymes: Recent Labs  Lab 12/20/23 2143  CKTOTAL 338*   BNP (last 3 results) Recent Labs    09/12/23 0859  PROBNP 63.0   HbA1C: No results for input(s): HGBA1C in the last 72 hours. CBG: No results for input(s): GLUCAP in the last 168 hours. Lipid Profile: No  results for input(s): CHOL, HDL, LDLCALC, TRIG, CHOLHDL, LDLDIRECT in the last 72 hours. Thyroid  Function Tests: No results for input(s): TSH, T4TOTAL, FREET4, T3FREE, THYROIDAB in the last 72 hours. Anemia Panel: No results for input(s): VITAMINB12, FOLATE, FERRITIN, TIBC, IRON, RETICCTPCT in the last 72 hours. Sepsis Labs: No results for input(s): PROCALCITON, LATICACIDVEN in the last 168 hours.  Recent Results (from the past 240 hours)  Surgical pcr screen     Status: None   Collection Time: 12/21/23  9:34 AM   Specimen: Nasal Mucosa; Nasal Swab  Result Value Ref Range Status   MRSA, PCR NEGATIVE NEGATIVE Final   Staphylococcus aureus NEGATIVE NEGATIVE Final    Comment: (NOTE) The Xpert SA Assay (FDA approved for NASAL specimens in patients 62 years of age and older), is one component of a comprehensive surveillance program. It is not intended to diagnose infection nor to guide or monitor treatment. Performed at [facility], [address] [place], [place] [address]     Radiology Studies: DG FEMUR PORT MIN 2 VIEWS LEFT Result Date: 12/21/2023 CLINICAL DATA:  Postop fixation of left  femur fracture. EXAM: LEFT FEMUR PORTABLE 2 VIEWS COMPARISON:  Intraoperative radiographs same date. Left femur radiographs 12/20/2023. FINDINGS: Status post left femoral intramedullary nail and dynamic compression screw fixation of the comminuted intertrochanteric/subtrochanteric fracture. The hardware appears well positioned. There are 2 distal interlocking screws. There is improved alignment of the fracture. No complications identified. Stable postsurgical changes from previous tibial ORIF. There is gas within the lateral soft tissues of the hip. Skin staples are in place. IMPRESSION: Status post ORIF of the comminuted left femoral intertrochanteric/subtrochanteric fracture with improved alignment. No immediate postoperative complication. Electronically Signed   By: Elsie Perone M.D.   On: 12/21/2023 14:46   DG FEMUR MIN 2 VIEWS LEFT Result Date: 12/21/2023 CLINICAL DATA:  Left intertrochanteric femur fracture fixation. EXAM: LEFT FEMUR 2 VIEWS COMPARISON:  Left femur radiographs 12/20/2023. FINDINGS: C-arm fluoroscopy was provided in the operating room without the presence of a radiologist.3 minutes and 5 seconds fluoroscopy time. 35.78 mGy air kerma. Twelve C-arm fluoroscopic images were obtained intraoperatively and are submitted for post operative interpretation. These images demonstrate open reduction and internal fixation of the common intertrochanteric and subtrochanteric fracture with an intramedullary nail and dynamic compression screw. There are 2 distal interlocking screws. There is improved alignment of the fracture. Pre-existing postsurgical changes in the proximal tibia are partially imaged. Please see intraoperative findings for further detail. IMPRESSION: Intraoperative fluoroscopic guidance for left femur fracture fixation. Electronically Signed   By: Elsie Perone M.D.   On: 12/21/2023 14:44   DG C-Arm 1-60 Min-No Report Result Date: 12/21/2023 Fluoroscopy was utilized by the  requesting physician.  No radiographic interpretation.   DG C-Arm 1-60 Min-No Report Result Date: 12/21/2023 Fluoroscopy was utilized by the requesting physician.  No radiographic interpretation.   CT Head Wo Contrast Result Date: 12/20/2023 CLINICAL DATA:  Provided history: Head trauma, minor. Neck trauma. Fall. EXAM: CT HEAD WITHOUT CONTRAST CT CERVICAL SPINE WITHOUT CONTRAST TECHNIQUE: Multidetector CT imaging of the head and cervical spine was performed following the standard protocol without intravenous contrast. Multiplanar CT image reconstructions of the cervical spine were also generated. RADIATION DOSE REDUCTION: This exam was performed according to the departmental dose-optimization program which includes automated exposure control, adjustment of the mA and/or kV according to patient size and/or use of iterative reconstruction technique. COMPARISON:  Head CT 09/04/2023. Cervical spine CT 09/04/2023. FINDINGS: CT HEAD FINDINGS  Brain: Generalized cerebral atrophy. Patchy and ill-defined hypoattenuation within the cerebral white matter, nonspecific but compatible with moderate chronic small vessel ischemic disease. There is no acute intracranial hemorrhage. No demarcated cortical infarct. No extra-axial fluid collection. No evidence of an intracranial mass. No midline shift. Vascular: No hyperdense vessel. Atherosclerotic calcifications. Skull: No calvarial fracture or aggressive osseous lesion. Sinuses/Orbits: No mass or acute finding within the imaged orbits. Minimal mucosal thickening within the bilateral maxillary sinuses. CT CERVICAL SPINE FINDINGS Alignment: Mild levocurvature of the cervical spine. Nonspecific reversal of the expected cervical lordosis. 4 mm grade 1 anterolisthesis at C2-C3 and C3-C4. 2 mm grade 1 retrolisthesis at C4-C5, C5-C6 and T1-T2. Skull base and vertebrae: The basion-dental and atlanto-dental intervals are maintained.No evidence of acute fracture to the cervical spine.  Developmental nonunion of the posterior arch of C1. Soft tissues and spinal canal: No prevertebral fluid or swelling. No visible canal hematoma. Subcentimeter thyroid  nodules not meeting consensus criteria for ultrasound follow-up based on size. No follow-up imaging recommended. Reference: J Am Coll Radiol. 2015 Feb;12(2): 143-50. Disc levels: Cervical spondylosis with multilevel disc space narrowing, disc bulges/central disc protrusions, posterior disc osteophyte complexes, uncovertebral hypertrophy and facet arthropathy. Disc space narrowing is greatest at C4-C5, C5-C6 and C6-C7 (advanced at these levels). Multilevel spinal canal stenosis. Most notably at C5-C6, a posterior disc osteophyte complex contributes to at least moderate spinal canal stenosis. Multilevel bony neural foraminal narrowing. Vental osteophytes, most prominent at C4-C5. Degenerative changes also present at the C1-C2 articulation. Upper chest: No consolidation within the imaged lung apices. No visible pneumothorax. IMPRESSION: CT head: 1. No evidence of an acute intracranial abnormality. 2. Parenchymal atrophy and chronic small vessel ischemic disease. 3. Minor paranasal sinus mucosal thickening. CT cervical spine: 1. No evidence of an acute cervical spine fracture. 2. Grade 1 spondylolisthesis at C2-C3, C3-C4, C4-C5, C5-C6 and T1-T2. 3. Nonspecific reversal of the expected cervical lordosis. 4. Mild levocurvature of the cervical spine. 5. Cervical spondylosis as described. Electronically Signed   By: Rockey Childs D.O.   On: 12/20/2023 16:54   CT Cervical Spine Wo Contrast Result Date: 12/20/2023 CLINICAL DATA:  Provided history: Head trauma, minor. Neck trauma. Fall. EXAM: CT HEAD WITHOUT CONTRAST CT CERVICAL SPINE WITHOUT CONTRAST TECHNIQUE: Multidetector CT imaging of the head and cervical spine was performed following the standard protocol without intravenous contrast. Multiplanar CT image reconstructions of the cervical spine were also  generated. RADIATION DOSE REDUCTION: This exam was performed according to the departmental dose-optimization program which includes automated exposure control, adjustment of the mA and/or kV according to patient size and/or use of iterative reconstruction technique. COMPARISON:  Head CT 09/04/2023. Cervical spine CT 09/04/2023. FINDINGS: CT HEAD FINDINGS Brain: Generalized cerebral atrophy. Patchy and ill-defined hypoattenuation within the cerebral white matter, nonspecific but compatible with moderate chronic small vessel ischemic disease. There is no acute intracranial hemorrhage. No demarcated cortical infarct. No extra-axial fluid collection. No evidence of an intracranial mass. No midline shift. Vascular: No hyperdense vessel. Atherosclerotic calcifications. Skull: No calvarial fracture or aggressive osseous lesion. Sinuses/Orbits: No mass or acute finding within the imaged orbits. Minimal mucosal thickening within the bilateral maxillary sinuses. CT CERVICAL SPINE FINDINGS Alignment: Mild levocurvature of the cervical spine. Nonspecific reversal of the expected cervical lordosis. 4 mm grade 1 anterolisthesis at C2-C3 and C3-C4. 2 mm grade 1 retrolisthesis at C4-C5, C5-C6 and T1-T2. Skull base and vertebrae: The basion-dental and atlanto-dental intervals are maintained.No evidence of acute fracture to the cervical spine. Developmental nonunion of the  posterior arch of C1. Soft tissues and spinal canal: No prevertebral fluid or swelling. No visible canal hematoma. Subcentimeter thyroid  nodules not meeting consensus criteria for ultrasound follow-up based on size. No follow-up imaging recommended. Reference: J Am Coll Radiol. 2015 Feb;12(2): 143-50. Disc levels: Cervical spondylosis with multilevel disc space narrowing, disc bulges/central disc protrusions, posterior disc osteophyte complexes, uncovertebral hypertrophy and facet arthropathy. Disc space narrowing is greatest at C4-C5, C5-C6 and C6-C7 (advanced at  these levels). Multilevel spinal canal stenosis. Most notably at C5-C6, a posterior disc osteophyte complex contributes to at least moderate spinal canal stenosis. Multilevel bony neural foraminal narrowing. Vental osteophytes, most prominent at C4-C5. Degenerative changes also present at the C1-C2 articulation. Upper chest: No consolidation within the imaged lung apices. No visible pneumothorax. IMPRESSION: CT head: 1. No evidence of an acute intracranial abnormality. 2. Parenchymal atrophy and chronic small vessel ischemic disease. 3. Minor paranasal sinus mucosal thickening. CT cervical spine: 1. No evidence of an acute cervical spine fracture. 2. Grade 1 spondylolisthesis at C2-C3, C3-C4, C4-C5, C5-C6 and T1-T2. 3. Nonspecific reversal of the expected cervical lordosis. 4. Mild levocurvature of the cervical spine. 5. Cervical spondylosis as described. Electronically Signed   By: Rockey Childs D.O.   On: 12/20/2023 16:54   DG Femur Min 2 Views Left Result Date: 12/20/2023 CLINICAL DATA:  fall; 809823 Fall 190176. EXAM: LEFT FEMUR 2 VIEWS; PELVIS - 1-2 VIEW COMPARISON:  None Available. FINDINGS: There is comminuted and angulated intertrochanteric fracture of the proximal left femur. No other acute fracture or dislocation. No aggressive osseous lesion. Visualized sacral arcuate lines are unremarkable. Unremarkable symphysis pubis. Mild degenerative changes of left hip joint. Left knee joint is not well evaluated on this exam. Metallic hardware noted in the proximal left tibia. No radiopaque foreign bodies. IMPRESSION: *Comminuted and angulated intertrochanteric fracture of the proximal left femur. Electronically Signed   By: Ree Molt M.D.   On: 12/20/2023 16:23   DG Pelvis 1-2 Views Result Date: 12/20/2023 CLINICAL DATA:  fall; 809823 Fall 190176. EXAM: LEFT FEMUR 2 VIEWS; PELVIS - 1-2 VIEW COMPARISON:  None Available. FINDINGS: There is comminuted and angulated intertrochanteric fracture of the proximal  left femur. No other acute fracture or dislocation. No aggressive osseous lesion. Visualized sacral arcuate lines are unremarkable. Unremarkable symphysis pubis. Mild degenerative changes of left hip joint. Left knee joint is not well evaluated on this exam. Metallic hardware noted in the proximal left tibia. No radiopaque foreign bodies. IMPRESSION: *Comminuted and angulated intertrochanteric fracture of the proximal left femur. Electronically Signed   By: Ree Molt M.D.   On: 12/20/2023 16:23   DG Chest 1 View Result Date: 12/20/2023 CLINICAL DATA:  Fall. EXAM: CHEST  1 VIEW COMPARISON:  09/04/2023. FINDINGS: Bilateral lung fields are clear. Bilateral costophrenic angles are clear. Normal cardio-mediastinal silhouette. No acute osseous abnormalities. Right shoulder arthroplasty partially seen. The soft tissues are within normal limits. IMPRESSION: No active disease. Electronically Signed   By: Ree Molt M.D.   On: 12/20/2023 16:20   Scheduled Meds:  atorvastatin   10 mg Oral Daily   cholecalciferol   1,000 Units Oral Daily   docusate sodium   100 mg Oral BID    HYDROmorphone  (DILAUDID ) injection  0.5 mg Intravenous Once   nortriptyline   150 mg Oral QHS   pantoprazole   40 mg Oral BID AC   PARoxetine   40 mg Oral Daily   sodium chloride  flush  3 mL Intravenous Q12H   Continuous Infusions:   ceFAZolin  (ANCEF ) IV  2 g (12/21/23 1504)    LOS: 1 day   Alejandro Marker, DO [facility] Available via Epic secure chat 7am-7pm After these hours, please refer to coverage provider listed on [email or web address] 12/21/2023, 4:20 PM

## 2023-12-21 NOTE — Plan of Care (Signed)
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
## 2023-12-21 NOTE — Anesthesia Postprocedure Evaluation (Signed)
 Anesthesia Post Note  Patient: Tiffany [name]  Procedure(s) Performed: FIXATION, FRACTURE, INTERTROCHANTERIC, WITH INTRAMEDULLARY ROD (Left)     Patient location during evaluation: PACU Anesthesia Type: General Level of consciousness: awake and alert and confused Pain management: pain level controlled Vital Signs Assessment: post-procedure vital signs reviewed and stable Respiratory status: spontaneous breathing, nonlabored ventilation, respiratory function stable and patient connected to nasal cannula oxygen Cardiovascular status: blood pressure returned to baseline and stable Postop Assessment: no apparent nausea or vomiting Anesthetic complications: no Comments: Some confusion in PACU requiring soft mittens. Oriented to person, but not place or year. No cranial nerve deficit; follows commands and moves all extremities. ISTAT electrolytes all WNL. Suspect postoperative delirium related to surgery/GA which should resolve over the next few hours.    No notable events documented.  Last Vitals:  Vitals:   12/21/23 1415 12/21/23 1433  BP: 118/71 98/72  Pulse: 82 62  Resp: 15 16  Temp: 36.9 C 36.7 C  SpO2: 93%     Last Pain:  Vitals:   12/21/23 1433  TempSrc: Oral  PainSc:                  Tiffany [name]

## 2023-12-21 NOTE — Anesthesia Preprocedure Evaluation (Signed)
 Anesthesia Evaluation  Patient identified by MRN, date of birth, ID band Patient awake    Reviewed: Allergy & Precautions, NPO status , Patient's Chart, lab work & pertinent test results, reviewed documented beta blocker date and time   History of Anesthesia Complications Negative for: history of anesthetic complications  Airway Mallampati: II  TM Distance: >3 FB Neck ROM: Limited    Dental  (+) Edentulous Upper, Edentulous Lower   Pulmonary neg COPD   breath sounds clear to auscultation       Cardiovascular hypertension, (-) angina (-) CAD and (-) Past MI + Valvular Problems/Murmurs MVP  Rhythm:Regular Rate:Normal     Neuro/Psych neg Seizures PSYCHIATRIC DISORDERS Anxiety Depression     Neuromuscular disease    GI/Hepatic hiatal hernia,GERD  ,,(+) neg Cirrhosis        Endo/Other    Renal/GU Renal disease     Musculoskeletal  (+) Arthritis , Osteoarthritis,    Abdominal   Peds  Hematology  (+) Blood dyscrasia, anemia Hgb 10.5   Anesthesia Other Findings   Reproductive/Obstetrics                              Anesthesia Physical Anesthesia Plan  ASA: 2  Anesthesia Plan: General   Post-op Pain Management:    Induction: Intravenous  PONV Risk Score and Plan: 2 and Ondansetron  and Dexamethasone   Airway Management Planned: Oral ETT  Additional Equipment:   Intra-op Plan:   Post-operative Plan: Extubation in OR  Informed Consent: I have reviewed the patients History and Physical, chart, labs and discussed the procedure including the risks, benefits and alternatives for the proposed anesthesia with the patient or authorized representative who has indicated his/her understanding and acceptance.     Dental advisory given  Plan Discussed with: CRNA  Anesthesia Plan Comments:          Anesthesia Quick Evaluation

## 2023-12-21 NOTE — Anesthesia Procedure Notes (Signed)
 Procedure Name: Intubation Date/Time: 12/21/2023 10:41 AM  Performed by: Vertie Arthea RAMAN, CRNAPre-anesthesia Checklist: Patient identified, Emergency Drugs available, Suction available and Patient being monitored Patient Re-evaluated:Patient Re-evaluated prior to induction Oxygen Delivery Method: Circle System Utilized Preoxygenation: Pre-oxygenation with 100% oxygen Induction Type: IV induction Ventilation: Mask ventilation without difficulty Laryngoscope Size: Mac and 3 Grade View: Grade I Tube type: Oral Tube size: 7.0 mm Number of attempts: 1 Airway Equipment and Method: Stylet and Oral airway Placement Confirmation: ETT inserted through vocal cords under direct vision, positive ETCO2 and breath sounds checked- equal and bilateral Tube secured with: Tape Dental Injury: Teeth and Oropharynx as per pre-operative assessment

## 2023-12-21 NOTE — Transfer of Care (Signed)
 Immediate Anesthesia Transfer of Care Note  Patient: Tiffany [name]  Procedure(s) Performed: FIXATION, FRACTURE, INTERTROCHANTERIC, WITH INTRAMEDULLARY ROD (Left)  Patient Location: PACU  Anesthesia Type:General  Level of Consciousness: confused  Airway & Oxygen Therapy: Patient Spontanous Breathing and Patient connected to face mask oxygen  Post-op Assessment: Report given to RN and Post -op Vital signs reviewed and stable  Post vital signs: Reviewed and stable  Last Vitals:  Vitals Value Taken Time  BP 111/60 12/21/23 13:00  Temp    Pulse 82 12/21/23 13:03  Resp 8 12/21/23 13:03  SpO2 100 % 12/21/23 13:03  Vitals shown include unfiled device data.  Last Pain:  Vitals:   12/21/23 1248  TempSrc:   PainSc: Asleep         Complications: No notable events documented.

## 2023-12-21 NOTE — Progress Notes (Signed)
 Transition of Care [facility]) - CAGE-AID Screening   Patient Details  Name: Tiffany [name] MRN: [id number] Date of Birth: 02-May-1946  Transition of Care [facility] CM/SW Contact:    Sallyanne MALVA Mettle, RN Phone Number: 12/21/2023, 9:09 PM   Clinical Narrative:  Pt reports no alcohol or drug use, no resources indicated.  CAGE-AID Screening:    Have You Ever Felt You Ought to Cut Down on Your Drinking or Drug Use?: No Have People Annoyed You By Critizing Your Drinking Or Drug Use?: No Have You Felt Bad Or Guilty About Your Drinking Or Drug Use?: No Have You Ever Had a Drink or Used Drugs First Thing In The Morning to Steady Your Nerves or to Get Rid of a Hangover?: No CAGE-AID Score: 0  Substance Abuse Education Offered: No

## 2023-12-21 NOTE — Op Note (Signed)
 Operative Note  Date of operation: 12/21/2023 Preoperative diagnosis: Left intertrochanteric/subtrochanteric comminuted proximal femur fracture Postoperative diagnosis: Same  Procedure: Open reduction/intra fixation of left complex proximal femur fracture  Implants: [employer] 10 x 380 InterTAN femoral nail with interdigitating 90 mm lag screw/85 mm compression screw and 2 distal interlocking screws  Surgeon: Lonni GRADE. Vernetta, MD Assistant: Tory Gaskins, PA-C  Anesthesia: General Antibiotics: IV Ancef  EBL: 100 cc Complications: None  Indications: The patient is a 78 year old female who presented to the emergency room yesterday late afternoon via EMS after being found down at her home after mechanical fall.  In the emergency room she was found to have a very complex comminuted proximal femur fracture.  She was graciously admitted to the medicine service for medical optimization and now presents for definitive surgical fixation of this complex left proximal femur fracture.  I did speak in length in detail with the patient about her fracture and the rationale behind proceeding with surgery as well as the risk and benefits of surgery.  Description after informed consent obtained for left hip was marked, the patient was brought to the operating room and general anesthesia was obtained while she was on her stretcher.  Next she was placed supine on the fracture table with a perineal post in place and her left operative leg in inline skeletal traction in her right nonoperative leg placed in a well leg holder with appropriate padding with the hip abducted and flexed.  We then assessed the fracture under direct fluoroscopy and pull traction on the fracture to get is aligned just appropriate as possible.  It was incredibly comminuted and did extend into the intertrochanteric and subtrochanteric areas with the greater trochanteric piece being separate.  This did make for a very complex fracture.   We then prepped the lateral femur on the left side all the way past the knee with DuraPrep and sterile drapes.  An incision was then made over the lateral aspect of the hip just proximal to the greater trochanter and we did extend this extensively.  Under direct fluoroscopy a temporary guidepin was then placed through the greater trochanteric region down to the lesser trochanteric region.  We then used an initiating reamer to open up the femoral canal.  Preoperatively prior to prepping and draping with the femoral nail in a sterile box we chose our 10 x 380 femoral nail.  This was then passed down the canal sterilely without difficulty using outrigger guide.  We then made a separate lateral incision and placed temporary guidepins for our interdigitating lag screw compression screw construct.  We took measurements off of this and chose our at 90 mm lag screw and 85 mm compression screw.  We drilled to those doubts and then placed those 2 screws without difficulty and did get some fracture compression.  We then made 2 separate stab incisions distally along the lateral aspect of the thigh toward the knee and placed 2 interlocking screws under direct fluoroscopy from lateral to medial.  We put the hip through internal extra rotation and I did move his unit.  The fracture still remains significant comminuted but in a better position.  All wounds were then irrigated with normal saline solution with the deep tissue closed by 0 Vicryl followed by 2-0 Vicryl because subcutaneous tissue and staples on all skin incisions followed by sterile dressing.  The patient was then taken off the fracture table, awakened, extubated and taken the recovery room.  Tory Gaskins PA-C did assist  during the entire case and beginning the end and his assistance was crucial and medically necessary for soft tissue management and retraction, helping guide implant placement and a layered closure of the wound.

## 2023-12-21 NOTE — Hospital Course (Addendum)
 Tiffany [name] is a 78 y.o. female with hx of hypertension, hyperlipidemia, gastritis/esophagitis, esophageal stenosis, anxiety, who presented after a ground-level fall.  She reports that she was standing at the end of the night and she slipped and fell.  Her only complaint is pain in the left hip denies other injuries. Orthopedic Surgery was consulted and took the patient for ORIF of the Left Complex Proximal Femur Fx and she is POD 6. Blood count did drop so will need to Monitor for Transfusion but now Hgb hovering in the low 7 range. PT/OT recommending SNF.   Hospitalization had been complicated by Hypoxia and patient continues to Desaturate without oxygen but is stable at 4 Liters now.  Per discussion with family she is supposed to be on oxygen in the outpatient setting but has been noncompliant and refused.  She is improved and hemoglobin is stable and she is medically stable for discharge at this time will need follow-up with PCP and orthopedic surgery in outpatient setting and continue rehabilitative efforts at skilled nursing facility.  Assessment and Plan:  Left intertrochanteric hip fracture s/p IM Rod POD 6 Clinical diagnosis Osteoporosis Ground-level fall -Orthopedic surgery consulted and took the pateint for ORIF on 7/10 -DVT prophylaxis per Ortho and they were recommending ASA 81 mg po BID x4 weeks; Patient has an ASA allergy listed but will need to verify this Allergy if it is a true allergy.  I discussed with orthopedic surgery and they are okay with transitioning to 2.5 mg p.o. twice daily of apixaban  for VTE prophylaxis.  Continue with SCDs. -Ortho recommending up w/ Therapy and recommending TDWB on LLE due to complex Nature of Fx -Tele monitoring periop -PT/ OT eval postop ordered and recommending SNF -Pain mgmt: Acetaminophen  1000 mg po q6h prn for Mild Pain, Methocarbamol   500 mg po q8h as needed spasm, Oxycodone  IR 5-10 mg po q4hprn and 10-15 mg  p.o. every 4 hours prn for  moderate/severe, Hydromorphone  0.5 mg IV every 4 hours prn for breakthrough -Check vitamin D  and was 33.27; was started on Vitamin D3 1000 IU daily -Will need follow-up with PCP for further treatment of osteoporosis -She had a Leukocytosis (WBC went from 11.9 -> 12.3 -> 10.2 -> 8.3 -> 10.2 -> 9.3 -> 9.5) and likely reactive.  -Orthopedic Surgery recommending TdWB for 2-4 weeks and then advant to 50% WB b/w 4-6 week standpoint after surgery.  -Bowel regimen initiated and will c/w Miralax  17 grams po Daily prn and Docusate 100 mg po BID. CTM and further care per Orthopedic Sx -PT/OT recommending SNF and medically stable for D/C   ? Short-term Memory Deficit: After discussing initial presentation and plan she was very unclear about these details. However fully oriented. Delirium Precautions   Acute Respiratory Failure with Hypoxia: Was wearing 4 to 5 L of supplemental oxygen.  Initial Chest x-ray done and showed Low lung volumes with vascular crowding and streaky basilar atelectasis. Could not exclude developing pulmonary infiltrates.  Add flutter valve, incentive spirometry, guaifenesin .  As albuterol  2.5 mg nebs every 4 as needed for wheezing or shortness of breath. Wean O2 as tolerated. SpO2: 98 % O2 Flow Rate (L/min): 4 L/min. Will hold off starting Abx for now -CXR this AM done showed Low lung volumes with bronchovascular crowding and bibasilar atelectasis. -Per family she was recommended to have oxygen in the outpatient setting but the patient had declined.  Will be discharging her with oxygen at this time now. Continues to take her O2  off. If hypoxia persists will consider CTA of the Chest to r/o PE as D-Dimer was 3.10. Will hold off inpatient CTA and can be deferred to the outpatient setting given stability. Wells Criteria done and 1.5 so unlikely PE as she does not have clinical signs of DVT. Likely will need Supplemental O2 @ D/C.  Being started on 2.5 mg of p.o. BID Apixaban  and will continue  for now and would be hesitant to place on FULL dose AC given continued and frequent falls; . Will need outpt Pulmonary F/U for further evalaution;   Essential Hypertension: Not on antihypertensives at time of admission. CTM BP per Protocol. Last BP reading was a little soft at 105/52  Hyperlipidemia: C/w Home Atorvastatin  10 mg po Daily   History Esophageal Stricture: Noted and see below  Renal Insuffiencey: BUN/Cr Trend: Recent Labs  Lab 12/21/23 0734 12/21/23 1411 12/22/23 0954 12/23/23 1104 12/24/23 0826 12/25/23 0947 12/26/23 0620  BUN 11 11 13 15 8  7* 7*  CREATININE 0.88 0.80 1.04* 0.93 0.72 0.78 0.72  -Avoid Nephrotoxic Medications, Contrast Dyes, Hypotension and Dehydration to Ensure Adequate Renal Perfusion and will need to Renally Adjust Meds -Continue to Monitor and Trend Renal Function carefully and repeat CMP in the AM   HypoNatremia: Na+ Trend:  Recent Labs  Lab 12/21/23 0734 12/21/23 1411 12/22/23 0954 12/23/23 1104 12/24/23 0826 12/25/23 0947 12/26/23 0620  NA 138 137 130* 127* 133* 130* 133*  -CTM and Trend and repeat CMP in the AM  Dysphagia: SLP evaluated and recommending a Dysphagia 3 Diet and recommending an Esophagram which has been ordered and showed Severe esophageal dysmotility visualized causing inability to pass a 13 mm barium tablet. No definite esophageal stricture. Gastroesophageal reflux visualized.. -D/w GI and they feel she does have evidence of severe dysmotility on the barium swallow, and no definite stricture though the tablet hung up. Since she just had EGD with dilation of distal esophageal stricture in May 2025 the GI team feels she could be kept on a dysphagia 3 diet, and needs to be upright for all meals and upright for at least an hour after meals n.p.o. for 4 hours prior to bedtime, sips of fluids between bites chew food carefully -Currently not recommending need repeat EGD/dilation inpatient and recommending outpatient F/U with  GI  Mood Disorder/Anxiety/Insomnia: C/w home Paroxetine  40 mg po Daily, Buspirone  5 mg TIDprn Anxiety, Nortriptyline  nightly and Melatonin 6 mg po qHS  ABLA due to Post-operative drop /Normocytic Anemia: Hgb/Hct Trend:  Recent Labs  Lab 12/21/23 0734 12/21/23 1411 12/22/23 0954 12/23/23 1104 12/24/23 0826 12/25/23 0947 12/26/23 0620  HGB 10.5* 10.5* 7.8* 7.3* 7.4* 7.7* 7.7*  HCT 32.6* 31.0* 24.7* 22.2* 23.4* 23.9* 24.1*  MCV 95.9  --  99.2 94.9 96.7 97.6 97.2  -Expected a Post-Operative Drop and now Hgb hovering in the low 7 range. Checked Anemia Panel and showed an iron level of 14, UIBC of 304, TIBC 318, saturation ratios of 4%, ferritin of 67, folate level of 13.5 and vitamin B12 level 336. CTM for S/Sx of Bleeding; No overt bleeding noted. Repeat CBC in the AM  Gastritis/Esophagitis/GERD/GI Prophylaxis: Continue home PPI w/ Pantoprazole  40 mg po BID  Hypoalbuminemia: Patient's Albumin  Lvl Trend went from 3.6 -> 3.7 -> 2.8 -> 2.6 x2 -> 2.5 x2. CTM and Trend and repeat CMP in the AM

## 2023-12-22 ENCOUNTER — Encounter (HOSPITAL_COMMUNITY): Payer: Self-pay | Admitting: Orthopaedic Surgery

## 2023-12-22 DIAGNOSIS — D62 Acute posthemorrhagic anemia: Secondary | ICD-10-CM

## 2023-12-22 DIAGNOSIS — S72142A Displaced intertrochanteric fracture of left femur, initial encounter for closed fracture: Secondary | ICD-10-CM | POA: Diagnosis not present

## 2023-12-22 LAB — CBC WITH DIFFERENTIAL/PLATELET
Abs Immature Granulocytes: 0.07 K/uL (ref 0.00–0.07)
Basophils Absolute: 0 K/uL (ref 0.0–0.1)
Basophils Relative: 0 %
Eosinophils Absolute: 0 K/uL (ref 0.0–0.5)
Eosinophils Relative: 0 %
HCT: 24.7 % — ABNORMAL LOW (ref 36.0–46.0)
Hemoglobin: 7.8 g/dL — ABNORMAL LOW (ref 12.0–15.0)
Immature Granulocytes: 1 %
Lymphocytes Relative: 16 %
Lymphs Abs: 1.6 K/uL (ref 0.7–4.0)
MCH: 31.3 pg (ref 26.0–34.0)
MCHC: 31.6 g/dL (ref 30.0–36.0)
MCV: 99.2 fL (ref 80.0–100.0)
Monocytes Absolute: 1.2 K/uL — ABNORMAL HIGH (ref 0.1–1.0)
Monocytes Relative: 11 %
Neutro Abs: 7.3 K/uL (ref 1.7–7.7)
Neutrophils Relative %: 72 %
Platelets: 259 K/uL (ref 150–400)
RBC: 2.49 MIL/uL — ABNORMAL LOW (ref 3.87–5.11)
RDW: 13.8 % (ref 11.5–15.5)
WBC: 10.2 K/uL (ref 4.0–10.5)
nRBC: 0 % (ref 0.0–0.2)

## 2023-12-22 LAB — MAGNESIUM: Magnesium: 1.8 mg/dL (ref 1.7–2.4)

## 2023-12-22 LAB — COMPREHENSIVE METABOLIC PANEL WITH GFR
ALT: 15 U/L (ref 0–44)
AST: 41 U/L (ref 15–41)
Albumin: 2.8 g/dL — ABNORMAL LOW (ref 3.5–5.0)
Alkaline Phosphatase: 64 U/L (ref 38–126)
Anion gap: 11 (ref 5–15)
BUN: 13 mg/dL (ref 8–23)
CO2: 23 mmol/L (ref 22–32)
Calcium: 8.2 mg/dL — ABNORMAL LOW (ref 8.9–10.3)
Chloride: 96 mmol/L — ABNORMAL LOW (ref 98–111)
Creatinine, Ser: 1.04 mg/dL — ABNORMAL HIGH (ref 0.44–1.00)
GFR, Estimated: 55 mL/min — ABNORMAL LOW (ref >60)
Glucose, Bld: 102 mg/dL — ABNORMAL HIGH (ref 70–99)
Potassium: 4.4 mmol/L (ref 3.5–5.1)
Sodium: 130 mmol/L — ABNORMAL LOW (ref 135–145)
Total Bilirubin: 0.5 mg/dL (ref 0.0–1.2)
Total Protein: 5.7 g/dL — ABNORMAL LOW (ref 6.5–8.1)

## 2023-12-22 LAB — PHOSPHORUS: Phosphorus: 2.7 mg/dL (ref 2.5–4.6)

## 2023-12-22 MED ORDER — MAGNESIUM SULFATE 2 GM/50ML IV SOLN
2.0000 g | Freq: Once | INTRAVENOUS | Status: AC
  Administered 2023-12-22: 2 g via INTRAVENOUS
  Filled 2023-12-22: qty 50

## 2023-12-22 MED ORDER — DIPHENHYDRAMINE HCL 25 MG PO CAPS
25.0000 mg | ORAL_CAPSULE | Freq: Once | ORAL | Status: AC
  Administered 2023-12-23: 25 mg via ORAL
  Filled 2023-12-22: qty 1

## 2023-12-22 MED ORDER — SODIUM CHLORIDE 0.9 % IV BOLUS: 1000.0000 mL | Freq: Once | INTRAVENOUS | Status: DC

## 2023-12-22 NOTE — Progress Notes (Signed)
 Subjective: 1 Day Post-Op Procedure(s) (LRB): FIXATION, FRACTURE, INTERTROCHANTERIC, WITH INTRAMEDULLARY ROD (Left) Patient reports pain as moderate.  The patient is awake and alert.  I did show her x-rays from her surgery yesterday.  Her left hip fracture is quite complex.  She definitely has acute blood loss anemia on top of chronic anemia due to the complex nature of her fracture.  Objective: Vital signs in last 24 hours: Temp:  [95.6 F (35.3 C)-100.1 F (37.8 C)] 95.6 F (35.3 C) (07/11 1437) Pulse Rate:  [79-105] 91 (07/11 1437) Resp:  [16-18] 18 (07/11 1437) BP: (83-136)/(31-73) 94/31 (07/11 1437) SpO2:  [87 %-97 %] 97 % (07/11 1741)  Intake/Output from previous day: 07/10 0701 - 07/11 0700 In: 1140 [P.O.:240; I.V.:600; IV Piggyback:300] Out: 1825 [Urine:1725; Blood:100] Intake/Output this shift: Total I/O In: 243 [P.O.:240; I.V.:3] Out: -   Recent Labs    12/20/23 2143 12/21/23 0734 12/21/23 1411 12/22/23 0954  HGB 11.0* 10.5* 10.5* 7.8*   Recent Labs    12/21/23 0734 12/21/23 1411 12/22/23 0954  WBC 12.3*  --  10.2  RBC 3.40*  --  2.49*  HCT 32.6* 31.0* 24.7*  PLT 345  --  259   Recent Labs    12/21/23 0734 12/21/23 1411 12/22/23 0954  NA 138 137 130*  K 3.9 4.0 4.4  CL 102 99 96*  CO2 24  --  23  BUN 11 11 13   CREATININE 0.88 0.80 1.04*  GLUCOSE 118* 135* 102*  CALCIUM  8.8*  --  8.2*   Recent Labs    12/21/23 0734  INR 1.0    Sensation intact distally Intact pulses distally Dorsiflexion/Plantar flexion intact Incision: scant drainage   Assessment/Plan: 1 Day Post-Op Procedure(s) (LRB): FIXATION, FRACTURE, INTERTROCHANTERIC, WITH INTRAMEDULLARY ROD (Left) Up with therapy For now she should only be touchdown weightbearing on her left lower extremity due to the complex nature of her fracture.  Her hemoglobin is low and transfusion may be necessary depending on the trend of her labs and how her vital signs respond.  She does live  alone so she will likely need short-term skilled nursing placement this hospitalization.     Lonni CINDERELLA Poli 12/22/2023, 5:54 PM

## 2023-12-22 NOTE — Evaluation (Signed)
 Occupational Therapy Evaluation Patient Details Name: Tiffany [name] MRN: [id number] DOB: Jun 12, 1946 Today's Date: 12/22/2023   History of Present Illness   Pt is a 78 y/o female who presents 12/17/2023 s/p fall at home. She was found to have a very complex comminuted proximal L femur fracture and is now s/p ORIF on 12/21/2023. PMH significant for anxiety/depression, esophageal spasm, HTN, osteoporosis, plantar fasciitis, PVC's, R RSA.     Clinical Impressions Patient is s/p ORIF L femur fracture  surgery resulting in functional limitations due to the deficits listed below (see OT Problem List). Prior to admit, pt was living alone at home independent with all ADL tasks including driving. Patient will benefit from acute skilled OT to increase their safety and independence with ADL and functional mobility for ADL to allow facilitate discharge. Patient will benefit from continued inpatient follow up therapy, <3 hours/day. Pt currently limited by pain and requires increased physical assistance to complete all mobility and BADL tasks.        If plan is discharge home, recommend the following:   Two people to help with walking and/or transfers;A lot of help with bathing/dressing/bathroom;Assistance with cooking/housework;Assist for transportation;Help with stairs or ramp for entrance     Functional Status Assessment   Patient has had a recent decline in their functional status and demonstrates the ability to make significant improvements in function in a reasonable and predictable amount of time.     Equipment Recommendations   Tub/shower seat;Wheelchair cushion (measurements OT);Wheelchair (measurements OT)      Precautions/Restrictions   Precautions Precautions: Fall Recall of Precautions/Restrictions: Impaired Restrictions Weight Bearing Restrictions Per Provider Order: Yes LLE Weight Bearing Per Provider Order: Touchdown weight bearing     Mobility Bed Mobility Overal  bed mobility: Needs Assistance Bed Mobility: Supine to Sit     Supine to sit: Max assist, +2 for physical assistance, +2 for safety/equipment, HOB elevated, Used rails     General bed mobility comments: LLE resting on pillow and pillow/bed pad utilized to assist in advancing LLE towards EOB. Increased assist for trunk elevation and scooting out fully to EOB to get feet resting on floor. Pt unknowningly resistive to movement/very guarded.    Transfers Overall transfer level: Needs assistance Equipment used: Rolling walker (2 wheels) Transfers: Sit to/from Stand, Bed to chair/wheelchair/BSC Sit to Stand: Mod assist, +2 physical assistance, From elevated surface Stand pivot transfers: +2 physical assistance, +2 safety/equipment, From elevated surface, Mod assist         General transfer comment: Once standing, pt able to use heel/toe scooting method to laterally advance her RLE towards chair. Increased difficulty to advance LLE. Hospital socks preventing full ability to scoot R foot, but able to transition to chair with +2 assist for walker management, balance, and guiding hips down into the chair safely. Physical assist provided to manage RW.      Balance Overall balance assessment: Needs assistance Sitting-balance support: Bilateral upper extremity supported, Feet supported Sitting balance-Leahy Scale: Poor Sitting balance - Comments: sitting EOB Postural control: Right lateral lean (to offload LLE) Standing balance support: Bilateral upper extremity supported, During functional activity, Reliant on assistive device for balance Standing balance-Leahy Scale: Poor       ADL either performed or assessed with clinical judgement   ADL Overall ADL's : Needs assistance/impaired Eating/Feeding: Set up;Sitting   Grooming: Set up;Sitting   Upper Body Bathing: Set up;Sitting   Lower Body Bathing: Total assistance;Bed level   Upper Body Dressing : Set up;Sitting  Lower Body  Dressing: Total assistance;Bed level   Toilet Transfer: Moderate assistance;Rolling walker (2 wheels);Stand-pivot;+2 for physical assistance;BSC/3in1   Toileting- Clothing Manipulation and Hygiene: Total assistance;+2 for physical assistance;Sit to/from stand;Cueing for sequencing;Cueing for safety               Vision Baseline Vision/History: 1 Wears glasses Ability to See in Adequate Light: 0 Adequate Patient Visual Report: No change from baseline Vision Assessment?: No apparent visual deficits     Perception Perception: Not tested       Praxis Praxis: Not tested       Pertinent Vitals/Pain Pain Assessment Pain Assessment: 0-10 Pain Score: 10-Worst pain ever Pain Location: Left leg Pain Descriptors / Indicators: Aching, Constant Pain Intervention(s): Limited activity within patient's tolerance, Monitored during session, RN gave pain meds during session, Repositioned     Extremity/Trunk Assessment Upper Extremity Assessment Upper Extremity Assessment: Right hand dominant;Generalized weakness   Lower Extremity Assessment Lower Extremity Assessment: Defer to PT evaluation LLE Deficits / Details: Acute pain, decreased strength and AROM consistent with pre-op diagnosis and subsequent surgery. LLE: Unable to fully assess due to pain   Cervical / Trunk Assessment Cervical / Trunk Assessment: Other exceptions Cervical / Trunk Exceptions: Forward head posture with rounded shoulders   Communication Communication Communication: Impaired Factors Affecting Communication: Hearing impaired   Cognition Arousal: Alert Behavior During Therapy: WFL for tasks assessed/performed Cognition: History of cognitive impairments             OT - Cognition Comments: short term memory deficits - per chart review    Following commands: Impaired Following commands impaired: Follows one step commands inconsistently, Follows one step commands with increased time      Cueing  General Comments   Cueing Techniques: Verbal cues;Gestural cues  VSS on RA           Home Living Family/patient expects to be discharged to:: Private residence Living Arrangements: Alone Available Help at Discharge: Family;Available 24 hours/day Type of Home: House Home Access: Ramped entrance     Home Layout: One level     Bathroom Shower/Tub: Arts development officer Toilet: Handicapped height     Home Equipment: Teacher, English as a foreign language (2 wheels);Cane - single point;Grab bars - tub/shower;Grab bars - toilet          Prior Functioning/Environment Prior Level of Function : Independent/Modified Independent;Driving;History of Falls (last six months)       OT Problem List: Decreased strength;Pain;Decreased activity tolerance;Decreased safety awareness;Decreased knowledge of use of DME or AE;Impaired balance (sitting and/or standing);Decreased knowledge of precautions   OT Treatment/Interventions: Self-care/ADL training;Therapeutic exercise;Therapeutic activities;Patient/family education;DME and/or AE instruction;Manual therapy;Balance training;Neuromuscular education      OT Goals(Current goals can be found in the care plan section)   Acute Rehab OT Goals Patient Stated Goal: to watch her Soap Opera at 12:30 OT Goal Formulation: With patient Time For Goal Achievement: 01/05/24 Potential to Achieve Goals: Good   OT Frequency:  Min 2X/week    Co-evaluation   Reason for Co-Treatment: For patient/therapist safety;To address functional/ADL transfers PT goals addressed during session: Mobility/safety with mobility;Balance;Proper use of DME;Strengthening/ROM        AM-PAC OT 6 Clicks Daily Activity     Outcome Measure Help from another person eating meals?: None Help from another person taking care of personal grooming?: None Help from another person toileting, which includes using toliet, bedpan, or urinal?: Total Help from another person bathing  (including washing, rinsing, drying)?: A Lot Help from another person  to put on and taking off regular upper body clothing?: A Little Help from another person to put on and taking off regular lower body clothing?: Total 6 Click Score: 15   End of Session Equipment Utilized During Treatment: Gait belt;Rolling walker (2 wheels) Nurse Communication: Mobility status  Activity Tolerance: Patient tolerated treatment well;Patient limited by pain Patient left: in chair;with call bell/phone within reach;with chair alarm set  OT Visit Diagnosis: Unsteadiness on feet (R26.81);Muscle weakness (generalized) (M62.81);History of falling (Z91.81);Pain Pain - Right/Left: Left Pain - part of body: Leg                Time: 8843-8772 OT Time Calculation (min): 31 min Charges:  OT General Charges $OT Visit: 1 Visit OT Evaluation $OT Eval Moderate Complexity: 1 Mod OT Treatments $Self Care/Home Management : 8-22 mins  Tiffany [name], OTR/L,CBIS  Supplemental OT - [facility] and [facility] Secure Chat Preferred    Tiffany [name], Tiffany BIRCH 12/22/2023, 2:43 PM

## 2023-12-22 NOTE — Progress Notes (Signed)
 PROGRESS NOTE    Tiffany [name]  FMW:[id number] DOB: 1945/08/08 DOA: 12/20/2023 PCP: Avelina Greig BRAVO, MD   Brief Narrative:  Tiffany [name] is a 78 y.o. female with hx of hypertension, hyperlipidemia, gastritis/esophagitis, esophageal stenosis, anxiety, who presented after a ground-level fall.  She reports that she was standing at the end of the night and she slipped and fell.  Her only complaint is pain in the left hip denies other injuries. Orthopedic Surgery was consulted and took the patient for ORIF of the Left Complex Proximal Femur Fx and she is POD 1. Blood count did drop so will need to Monitor for Transfusion. PT/OT recommending SNF.   Assessment and Plan:  Left intertrochanteric hip fracture s/p IM Rod POD 1 Clinical diagnosis Osteoporosis Ground-level fall -Orthopedic surgery consulted and took the pateint for ORIF on 7/10 -DVT prophylaxis to be readdressed postop per Ortho; SCD for now  -Ortho recommending up w/ Therapy and recommending TDWB on LLE due to complex Nature of Fx -Tele monitoring periop -PT/ OT eval postop ordered and recommending SNF -Pain mgmt: Acetaminophen  1000 mg po q6h prn for Mild Pain, Methocarbamol   500 mg po q8h as needed spasm, Oxycodone  IR 5-10 mg po q4hprn and 10-15 mg  p.o. every 4 hours prn for moderate/severe, Hydromorphone  0.5 mg IV every 4 hours prn for breakthrough -Check vitamin D  and was 33.27; was started on Vitamin D3 1000 IU daily -Will need follow-up with PCP for further treatment of osteoporosis -She has a Leukocytosis (WBC went from 11.9 -> 12.3 -> 10.2) and likely reactive.  -Bowel regimen initiated and will c/w Miralax  17 grams po Daily prn and Docusate 100 mg po BID. CTM and further care per Orthopedic Sx   ? Short-term Memory Deficit: After discussing initial presentation and plan she was very unclear about these details. However fully oriented. Delirium Precautions   Essential Hypertension: Not on antihypertensives at time of  admission. CTM BP per Protocol. Last BP reading was a little soft at 94/31  Hyperlipidemia: C/w Home Atorvastatin  10 mg po Daily   History esophageal stricture: Noted  Renal Insuffiencey: BUN/Cr Trend: Recent Labs  Lab 12/20/23 1903 12/21/23 0734 12/21/23 1411 12/22/23 0954  BUN 12 11 11 13   CREATININE 0.99 0.88 0.80 1.04*  -Avoid Nephrotoxic Medications, Contrast Dyes, Hypotension and Dehydration to Ensure Adequate Renal Perfusion and will need to Renally Adjust Meds -Continue to Monitor and Trend Renal Function carefully and repeat CMP in the AM   HypoNatremia: Na+ Trend:  Recent Labs  Lab 12/20/23 1903 12/21/23 0734 12/21/23 1411 12/22/23 0954  NA 135 138 137 130*  -CTM and Trend and repeat CMP in the AM  Mood Disorder/Anxiety/Insomnia: C/w home Paroxetine  40 mg po Daily, Buspirone  5 mg TIDprn Anxiety, Nortriptyline  nightly and Melatonin 6 mg po qHS  ABLA due to Post-operative drop /Normocytic Anemia: Hgb/Hct Trend:  Recent Labs  Lab 12/20/23 2143 12/21/23 0734 12/21/23 1411 12/22/23 0954  HGB 11.0* 10.5* 10.5* 7.8*  HCT 34.6* 32.6* 31.0* 24.7*  MCV 96.9 95.9  --  99.2  -Expected a Post-Operative Drop. Check Anemia Panel in the AM. CTM for S/Sx of Bleeding; No overt bleeding noted. Repeat CBC in the AM  Gastritis/Esophagitis/GERD/GI Prophylaxis: Continue home PPI w/ Pantoprazole  40 mg po BID  Hypoalbuminemia: Patient's Albumin  Lvl Trend went from 3.6 -> 3.7 -> 2.8. CTM and Trend and repeat CMP in the AM   DVT prophylaxis: SCDs Start: 12/21/23 1434 SCDs Start: 12/20/23 2221    Code Status:  Limited: Do not attempt resuscitation (DNR) -DNR-LIMITED -Do Not Intubate/DNI  Family Communication: No family present @ bedside   Disposition Plan:  Level of care: Telemetry Medical Status is: Inpatient Remains inpatient appropriate because: Needs further clinical improvement and clearance by the specialists as PT/OT recommending SNF   Consultants:  Orthopedic  Surgery  Procedures:  As delineated as above   Antimicrobials:  Anti-infectives (From admission, onward)    Start     Dose/Rate Route Frequency Ordered Stop   12/21/23 1630  ceFAZolin  (ANCEF ) IVPB 2g/100 mL premix        2 g 200 mL/hr over 30 Minutes Intravenous Every 6 hours 12/21/23 1433 12/21/23 2126   12/21/23 1015  ceFAZolin  (ANCEF ) IVPB 2g/100 mL premix        2 g 200 mL/hr over 30 Minutes Intravenous On call to O.R. 12/21/23 9083 12/21/23 1118       Subjective: Seen and examined at bedside and complaining of pain. States her ring was removed before surgery and does not know where it is. Denied any CP or SOB. Feels tired. No other concern or complaints at this time.   Objective: Vitals:   12/22/23 0519 12/22/23 0810 12/22/23 1437 12/22/23 1741  BP: 136/73 (!) 85/49 (!) 94/31   Pulse: (!) 105 84 91   Resp: 18 18 18    Temp:  98.3 F (36.8 C) (!) 95.6 F (35.3 C)   TempSrc:   Rectal   SpO2: 93% (!) 87% 97% 97%  Weight:      Height:        Intake/Output Summary (Last 24 hours) at 12/22/2023 1811 Last data filed at 12/22/2023 1430 Gross per 24 hour  Intake 243 ml  Output 800 ml  Net -557 ml   Filed Weights   12/20/23 1443 12/21/23 0928  Weight: 59 kg 58.9 kg   Examination: Physical Exam:  Constitutional: Elderly chronically ill-appearing Caucasian female in NAD Respiratory: Mildly diminished to auscultation bilaterally with coarse breath sounds, no wheezing, rales, rhonchi or crackles. Normal respiratory effort and patient is not tachypenic. No accessory muscle use. Unlabored breathing  Cardiovascular: RRR, no murmurs / rubs / gallops. S1 and S2 auscultated. No extremity edema  Abdomen: Soft, non-tender, non-distended. Bowel sounds positive.  GU: Deferred. Musculoskeletal: No clubbing / cyanosis of digits/nails. No joint deformity upper and lower extremities.  Skin: No rashes, lesions, ulcers on a limited skin evaluation. No induration; Warm and dry.   Neurologic: CN 2-12 grossly intact with no focal deficits. Romberg sign and cerebellar reflexes not assessed.  Psychiatric: Awake and alert and confused  Data Reviewed: I have personally reviewed following labs and imaging studies  CBC: Recent Labs  Lab 12/20/23 2143 12/21/23 0734 12/21/23 1411 12/22/23 0954  WBC 11.9* 12.3*  --  10.2  NEUTROABS 9.9*  --   --  7.3  HGB 11.0* 10.5* 10.5* 7.8*  HCT 34.6* 32.6* 31.0* 24.7*  MCV 96.9 95.9  --  99.2  PLT 324 345  --  259   Basic Metabolic Panel: Recent Labs  Lab 12/20/23 1903 12/21/23 0734 12/21/23 1411 12/22/23 0954  NA 135 138 137 130*  K 4.6 3.9 4.0 4.4  CL 100 102 99 96*  CO2 24 24  --  23  GLUCOSE 114* 118* 135* 102*  BUN 12 11 11 13   CREATININE 0.99 0.88 0.80 1.04*  CALCIUM  8.9 8.8*  --  8.2*  MG  --  1.9  --  1.8  PHOS  --  3.1  --  2.7   GFR: Estimated Creatinine Clearance: 37.1 mL/min (A) (by C-G formula based on SCr of 1.04 mg/dL (H)). Liver Function Tests: Recent Labs  Lab 12/20/23 1903 12/21/23 0900 12/22/23 0954  AST 28 27 41  ALT 16 16 15   ALKPHOS 86 90 64  BILITOT 0.8 0.6 0.5  PROT 6.6 6.8 5.7*  ALBUMIN  3.6 3.7 2.8*   No results for input(s): LIPASE, AMYLASE in the last 168 hours. No results for input(s): AMMONIA in the last 168 hours. Coagulation Profile: Recent Labs  Lab 12/21/23 0734  INR 1.0   Cardiac Enzymes: Recent Labs  Lab 12/20/23 2143  CKTOTAL 338*   BNP (last 3 results) Recent Labs    09/12/23 0859  PROBNP 63.0   HbA1C: No results for input(s): HGBA1C in the last 72 hours. CBG: No results for input(s): GLUCAP in the last 168 hours. Lipid Profile: No results for input(s): CHOL, HDL, LDLCALC, TRIG, CHOLHDL, LDLDIRECT in the last 72 hours. Thyroid  Function Tests: No results for input(s): TSH, T4TOTAL, FREET4, T3FREE, THYROIDAB in the last 72 hours. Anemia Panel: No results for input(s): VITAMINB12, FOLATE, FERRITIN, TIBC,  IRON, RETICCTPCT in the last 72 hours. Sepsis Labs: No results for input(s): PROCALCITON, LATICACIDVEN in the last 168 hours.  Recent Results (from the past 240 hours)  Surgical pcr screen     Status: None   Collection Time: 12/21/23  9:34 AM   Specimen: Nasal Mucosa; Nasal Swab  Result Value Ref Range Status   MRSA, PCR NEGATIVE NEGATIVE Final   Staphylococcus aureus NEGATIVE NEGATIVE Final    Comment: (NOTE) The Xpert SA Assay (FDA approved for NASAL specimens in patients 82 years of age and older), is one component of a comprehensive surveillance program. It is not intended to diagnose infection nor to guide or monitor treatment. Performed at [facility], [address] [place], [place] [address]     Radiology Studies: DG FEMUR PORT MIN 2 VIEWS LEFT Result Date: 12/21/2023 CLINICAL DATA:  Postop fixation of left femur fracture. EXAM: LEFT FEMUR PORTABLE 2 VIEWS COMPARISON:  Intraoperative radiographs same date. Left femur radiographs 12/20/2023. FINDINGS: Status post left femoral intramedullary nail and dynamic compression screw fixation of the comminuted intertrochanteric/subtrochanteric fracture. The hardware appears well positioned. There are 2 distal interlocking screws. There is improved alignment of the fracture. No complications identified. Stable postsurgical changes from previous tibial ORIF. There is gas within the lateral soft tissues of the hip. Skin staples are in place. IMPRESSION: Status post ORIF of the comminuted left femoral intertrochanteric/subtrochanteric fracture with improved alignment. No immediate postoperative complication. Electronically Signed   By: Elsie Perone M.D.   On: 12/21/2023 14:46   DG FEMUR MIN 2 VIEWS LEFT Result Date: 12/21/2023 CLINICAL DATA:  Left intertrochanteric femur fracture fixation. EXAM: LEFT FEMUR 2 VIEWS COMPARISON:  Left femur radiographs 12/20/2023. FINDINGS: C-arm fluoroscopy was provided in the operating room  without the presence of a radiologist.3 minutes and 5 seconds fluoroscopy time. 35.78 mGy air kerma. Twelve C-arm fluoroscopic images were obtained intraoperatively and are submitted for post operative interpretation. These images demonstrate open reduction and internal fixation of the common intertrochanteric and subtrochanteric fracture with an intramedullary nail and dynamic compression screw. There are 2 distal interlocking screws. There is improved alignment of the fracture. Pre-existing postsurgical changes in the proximal tibia are partially imaged. Please see intraoperative findings for further detail. IMPRESSION: Intraoperative fluoroscopic guidance for left femur fracture fixation. Electronically Signed   By: Elsie Perone HERO.D.  On: 12/21/2023 14:44   DG C-Arm 1-60 Min-No Report Result Date: 12/21/2023 Fluoroscopy was utilized by the requesting physician.  No radiographic interpretation.   DG C-Arm 1-60 Min-No Report Result Date: 12/21/2023 Fluoroscopy was utilized by the requesting physician.  No radiographic interpretation.   Scheduled Meds:  atorvastatin   10 mg Oral Daily   cholecalciferol   1,000 Units Oral Daily   docusate sodium   100 mg Oral BID    HYDROmorphone  (DILAUDID ) injection  0.5 mg Intravenous Once   nortriptyline   150 mg Oral QHS   pantoprazole   40 mg Oral BID AC   PARoxetine   40 mg Oral Daily   sodium chloride  flush  3 mL Intravenous Q12H   Continuous Infusions:  magnesium  sulfate bolus IVPB      LOS: 2 days   Alejandro Marker, DO [facility] Available via Epic secure chat 7am-7pm After these hours, please refer to coverage provider listed on [email or web address] 12/22/2023, 6:11 PM

## 2023-12-22 NOTE — Evaluation (Signed)
 Physical Therapy Evaluation  Patient Details Name: Tiffany [name] MRN: [id number] DOB: [birth year] Today's Date: 12/22/2023  History of Present Illness  Pt is a 78 y/o female who presents 12/17/2023 s/p fall at home. She was found to have a very complex comminuted proximal femur fracture and is now s/p ORIF on 12/21/2023. PMH significant for anxiety/depression, esophageal spasm, HTN, osteoporosis, plantar fasciitis, PVC's, R RSA.   Clinical Impression  Pt admitted with above diagnosis. Pt currently with functional limitations due to the deficits listed below (see PT Problem List). At the time of PT eval pt was able to perform transfers with up to +2 assist and RW for support. Recommend continued rehab <3 hours/day to maximize functional independence, safety, and return to PLOF.     If plan is discharge home, recommend the following: A little help with walking and/or transfers;A little help with bathing/dressing/bathroom;Assistance with cooking/housework;Assist for transportation;Help with stairs or ramp for entrance   Can travel by private vehicle   No    Equipment Recommendations None recommended by PT  Recommendations for Other Services       Functional Status Assessment Patient has had a recent decline in their functional status and demonstrates the ability to make significant improvements in function in a reasonable and predictable amount of time.     Precautions / Restrictions Precautions Precautions: Fall Recall of Precautions/Restrictions: Impaired Restrictions Weight Bearing Restrictions Per Provider Order: Yes; Toe Touch Weight Bearing Left LE     Mobility  Bed Mobility Overal bed mobility: Needs Assistance Bed Mobility: Supine to Sit     Supine to sit: Max assist, +2 for physical assistance, +2 for safety/equipment, HOB elevated, Used rails     General bed mobility comments: LLE resting on pillow and pillow/bed pad utilized to assist in advancing LLE towards EOB.  Increased assist for trunk elevation and scooting out fully to EOB to get feet resting on floor.    Transfers Overall transfer level: Needs assistance Equipment used: Rolling walker (2 wheels) Transfers: Sit to/from Stand, Bed to chair/wheelchair/BSC Sit to Stand: Mod assist, +2 physical assistance, From elevated surface Stand pivot transfers: Min assist, +2 physical assistance, +2 safety/equipment, From elevated surface         General transfer comment: Once standing, pt able to use heel/toe scooting method to laterally advance her RLE towards chair. Increased difficulty to advance LLE. Hospital socks preventing full ability to scoot R foot, but able to transition to chair with +2 assist for walker management, balance, and guiding hips down into the chair safely.    Ambulation/Gait               General Gait Details: Unable to progress to gait training at this time.  Stairs            Wheelchair Mobility     Tilt Bed    Modified Rankin (Stroke Patients Only)       Balance Overall balance assessment: Needs assistance Sitting-balance support: Bilateral upper extremity supported, Feet supported Sitting balance-Leahy Scale: Poor   Postural control: Right lateral lean (To offweight LLE) Standing balance support: Bilateral upper extremity supported, During functional activity, Reliant on assistive device for balance Standing balance-Leahy Scale: Poor                               Pertinent Vitals/Pain Pain Assessment Pain Assessment: Faces Faces Pain Scale: Hurts little more Pain Location: Left leg Pain Descriptors /  Indicators: Aching, Constant Pain Intervention(s): Limited activity within patient's tolerance, Monitored during session, Repositioned    Home Living Family/patient expects to be discharged to:: Private residence Living Arrangements: Alone Available Help at Discharge: Family;Available 24 hours/day Type of Home: House Home Access:  Ramped entrance       Home Layout: One level Home Equipment: BSC/3in1;Rolling Walker (2 wheels);Cane - single point;Grab bars - tub/shower;Grab bars - toilet      Prior Function Prior Level of Function : Independent/Modified Independent;Driving;History of Falls (last six months)                     Extremity/Trunk Assessment   Upper Extremity Assessment Upper Extremity Assessment: Defer to OT evaluation    Lower Extremity Assessment Lower Extremity Assessment: LLE deficits/detail LLE Deficits / Details: Acute pain, decreased strength and AROM consistent with pre-op diagnosis and subsequent surgery. LLE: Unable to fully assess due to pain    Cervical / Trunk Assessment Cervical / Trunk Assessment: Other exceptions Cervical / Trunk Exceptions: Forward head posture with rounded shoulders  Communication   Communication Communication: Impaired Factors Affecting Communication: Hearing impaired    Cognition Arousal: Alert Behavior During Therapy: WFL for tasks assessed/performed   PT - Cognitive impairments: Memory, Awareness, Safety/Judgement, Problem solving, Sequencing                                 Cueing Cueing Techniques: Verbal cues, Gestural cues     General Comments      Exercises     Assessment/Plan    PT Assessment Patient needs continued PT services  PT Problem List Decreased strength;Decreased activity tolerance;Decreased balance;Decreased mobility;Decreased knowledge of use of DME;Decreased safety awareness;Decreased knowledge of precautions;Pain       PT Treatment Interventions DME instruction;Gait training;Functional mobility training;Therapeutic activities;Therapeutic exercise;Balance training;Patient/family education    PT Goals (Current goals can be found in the Care Plan section)  Acute Rehab PT Goals Patient Stated Goal: Be able to return to her home PT Goal Formulation: With patient Time For Goal Achievement:  01/05/24 Potential to Achieve Goals: Good    Frequency Min 2X/week     Co-evaluation PT/OT/SLP Co-Evaluation/Treatment: Yes Reason for Co-Treatment: For patient/therapist safety;To address functional/ADL transfers PT goals addressed during session: Mobility/safety with mobility;Balance;Proper use of DME;Strengthening/ROM         AM-PAC PT 6 Clicks Mobility  Outcome Measure Help needed turning from your back to your side while in a flat bed without using bedrails?: A Lot Help needed moving from lying on your back to sitting on the side of a flat bed without using bedrails?: Total Help needed moving to and from a bed to a chair (including a wheelchair)?: Total Help needed standing up from a chair using your arms (e.g., wheelchair or bedside chair)?: Total Help needed to walk in hospital room?: Total Help needed climbing 3-5 steps with a railing? : Total 6 Click Score: 7    End of Session Equipment Utilized During Treatment: Gait belt Activity Tolerance: Patient tolerated treatment well Patient left: in chair;with call bell/phone within reach;with family/visitor present Nurse Communication: Mobility status;Other (comment) (RN aware that there are no batteries in chair alarm box and requests that PT still get pt up to the chair. Charge RN notified of need for batteries for chair alarm.) PT Visit Diagnosis: Unsteadiness on feet (R26.81);Pain Pain - Right/Left: Left Pain - part of body: Hip    Time: 8843-8771  PT Time Calculation (min) (ACUTE ONLY): 32 min   Charges:   PT Evaluation $PT Eval Moderate Complexity: 1 Mod   PT General Charges $$ ACUTE PT VISIT: 1 Visit         Leita Sable, PT, DPT Acute Rehabilitation Services Secure Chat Preferred Office: [phone]   Leita JONETTA Sable 12/22/2023, 1:58 PM

## 2023-12-23 ENCOUNTER — Inpatient Hospital Stay (HOSPITAL_COMMUNITY)

## 2023-12-23 DIAGNOSIS — S72142A Displaced intertrochanteric fracture of left femur, initial encounter for closed fracture: Secondary | ICD-10-CM | POA: Diagnosis not present

## 2023-12-23 DIAGNOSIS — D62 Acute posthemorrhagic anemia: Secondary | ICD-10-CM | POA: Diagnosis not present

## 2023-12-23 DIAGNOSIS — J9601 Acute respiratory failure with hypoxia: Secondary | ICD-10-CM | POA: Diagnosis not present

## 2023-12-23 LAB — PHOSPHORUS: Phosphorus: 2.7 mg/dL (ref 2.5–4.6)

## 2023-12-23 LAB — CBC WITH DIFFERENTIAL/PLATELET
Abs Immature Granulocytes: 0.05 K/uL (ref 0.00–0.07)
Basophils Absolute: 0 K/uL (ref 0.0–0.1)
Basophils Relative: 0 %
Eosinophils Absolute: 0.1 K/uL (ref 0.0–0.5)
Eosinophils Relative: 1 %
HCT: 22.2 % — ABNORMAL LOW (ref 36.0–46.0)
Hemoglobin: 7.3 g/dL — ABNORMAL LOW (ref 12.0–15.0)
Immature Granulocytes: 1 %
Lymphocytes Relative: 10 %
Lymphs Abs: 0.8 K/uL (ref 0.7–4.0)
MCH: 31.2 pg (ref 26.0–34.0)
MCHC: 32.9 g/dL (ref 30.0–36.0)
MCV: 94.9 fL (ref 80.0–100.0)
Monocytes Absolute: 0.8 K/uL (ref 0.1–1.0)
Monocytes Relative: 9 %
Neutro Abs: 6.5 K/uL (ref 1.7–7.7)
Neutrophils Relative %: 79 %
Platelets: 262 K/uL (ref 150–400)
RBC: 2.34 MIL/uL — ABNORMAL LOW (ref 3.87–5.11)
RDW: 13.8 % (ref 11.5–15.5)
WBC: 8.3 K/uL (ref 4.0–10.5)
nRBC: 0 % (ref 0.0–0.2)

## 2023-12-23 LAB — COMPREHENSIVE METABOLIC PANEL WITH GFR
ALT: 13 U/L (ref 0–44)
AST: 45 U/L — ABNORMAL HIGH (ref 15–41)
Albumin: 2.6 g/dL — ABNORMAL LOW (ref 3.5–5.0)
Alkaline Phosphatase: 65 U/L (ref 38–126)
Anion gap: 7 (ref 5–15)
BUN: 15 mg/dL (ref 8–23)
CO2: 23 mmol/L (ref 22–32)
Calcium: 8 mg/dL — ABNORMAL LOW (ref 8.9–10.3)
Chloride: 97 mmol/L — ABNORMAL LOW (ref 98–111)
Creatinine, Ser: 0.93 mg/dL (ref 0.44–1.00)
GFR, Estimated: >60 mL/min (ref >60)
Glucose, Bld: 132 mg/dL — ABNORMAL HIGH (ref 70–99)
Potassium: 4 mmol/L (ref 3.5–5.1)
Sodium: 127 mmol/L — ABNORMAL LOW (ref 135–145)
Total Bilirubin: 0.5 mg/dL (ref 0.0–1.2)
Total Protein: 5.9 g/dL — ABNORMAL LOW (ref 6.5–8.1)

## 2023-12-23 LAB — IRON AND TIBC
Iron: 14 ug/dL — ABNORMAL LOW (ref 28–170)
Saturation Ratios: 4 % — ABNORMAL LOW (ref 10.4–31.8)
TIBC: 318 ug/dL (ref 250–450)
UIBC: 304 ug/dL

## 2023-12-23 LAB — FOLATE: Folate: 13.5 ng/mL (ref >5.9)

## 2023-12-23 LAB — RETICULOCYTES
Immature Retic Fract: 28.2 % — ABNORMAL HIGH (ref 2.3–15.9)
RBC.: 2.36 MIL/uL — ABNORMAL LOW (ref 3.87–5.11)
Retic Count, Absolute: 44.1 K/uL (ref 19.0–186.0)
Retic Ct Pct: 1.9 % (ref 0.4–3.1)

## 2023-12-23 LAB — VITAMIN B12: Vitamin B-12: 336 pg/mL (ref 180–914)

## 2023-12-23 LAB — FERRITIN: Ferritin: 67 ng/mL (ref 11–307)

## 2023-12-23 LAB — MAGNESIUM: Magnesium: 2.1 mg/dL (ref 1.7–2.4)

## 2023-12-23 NOTE — Plan of Care (Signed)
   Problem: Education: Goal: Knowledge of General Education information will improve Description Including pain rating scale, medication(s)/side effects and non-pharmacologic comfort measures Outcome: Progressing

## 2023-12-23 NOTE — Progress Notes (Signed)
 PROGRESS NOTE    Tiffany [name]  FMW:[id number] DOB: Mar 21, 1946 DOA: 12/20/2023 PCP: Avelina Greig BRAVO, MD   Brief Narrative:  Tiffany [name] is a 78 y.o. female with hx of hypertension, hyperlipidemia, gastritis/esophagitis, esophageal stenosis, anxiety, who presented after a ground-level fall.  She reports that she was standing at the end of the night and she slipped and fell.  Her only complaint is pain in the left hip denies other injuries. Orthopedic Surgery was consulted and took the patient for ORIF of the Left Complex Proximal Femur Fx and she is POD 2. Blood count did drop so will need to Monitor for Transfusion. PT/OT recommending SNF.   Assessment and Plan:  Left intertrochanteric hip fracture s/p IM Rod POD 2 Clinical diagnosis Osteoporosis Ground-level fall -Orthopedic surgery consulted and took the pateint for ORIF on 7/10 -DVT prophylaxis to be readdressed postop per Ortho; SCD for now  -Ortho recommending up w/ Therapy and recommending TDWB on LLE due to complex Nature of Fx -Tele monitoring periop -PT/ OT eval postop ordered and recommending SNF -Pain mgmt: Acetaminophen  1000 mg po q6h prn for Mild Pain, Methocarbamol   500 mg po q8h as needed spasm, Oxycodone  IR 5-10 mg po q4hprn and 10-15 mg  p.o. every 4 hours prn for moderate/severe, Hydromorphone  0.5 mg IV every 4 hours prn for breakthrough -Check vitamin D  and was 33.27; was started on Vitamin D3 1000 IU daily -Will need follow-up with PCP for further treatment of osteoporosis -She has a Leukocytosis (WBC went from 11.9 -> 12.3 -> 10.2 and is now 8.3) and likely reactive.  -Bowel regimen initiated and will c/w Miralax  17 grams po Daily prn and Docusate 100 mg po BID. CTM and further care per Orthopedic Sx -PT/OT recommending SNF   ? Short-term Memory Deficit: After discussing initial presentation and plan she was very unclear about these details. However fully oriented. Delirium Precautions   Acute Respiratory  Failure with Hypoxia: Was wearing 4 to 5 L of supplemental oxygen.  Chest x-ray done and showed Low lung volumes with vascular crowding and streaky basilar atelectasis. Could not exclude developing pulmonary infiltrates.  Add flutter valve, incentive spirometry, guaifenesin .  As albuterol  2.5 mg nebs every 4 as needed for wheezing or shortness of breath. Wean O2 as tolerated. SpO2: 97 % O2 Flow Rate (L/min): 3.5 L/min. Will hold off starting Abx for now  Essential Hypertension: Not on antihypertensives at time of admission. CTM BP per Protocol. Last BP reading was a little soft at 122/58  Hyperlipidemia: C/w Home Atorvastatin  10 mg po Daily   History esophageal stricture: Noted  Renal Insuffiencey: BUN/Cr Trend: Recent Labs  Lab 12/20/23 1903 12/21/23 0734 12/21/23 1411 12/22/23 0954 12/23/23 1104  BUN 12 11 11 13 15   CREATININE 0.99 0.88 0.80 1.04* 0.93  -Avoid Nephrotoxic Medications, Contrast Dyes, Hypotension and Dehydration to Ensure Adequate Renal Perfusion and will need to Renally Adjust Meds -Continue to Monitor and Trend Renal Function carefully and repeat CMP in the AM   HypoNatremia: Na+ Trend:  Recent Labs  Lab 12/20/23 1903 12/21/23 0734 12/21/23 1411 12/22/23 0954 12/23/23 1104  NA 135 138 137 130* 127*  -CTM and Trend and repeat CMP in the AM  Mood Disorder/Anxiety/Insomnia: C/w home Paroxetine  40 mg po Daily, Buspirone  5 mg TIDprn Anxiety, Nortriptyline  nightly and Melatonin 6 mg po qHS  ABLA due to Post-operative drop /Normocytic Anemia: Hgb/Hct Trend:  Recent Labs  Lab 12/20/23 2143 12/21/23 0734 12/21/23 1411 12/22/23 0954 12/23/23 1104  HGB 11.0* 10.5* 10.5* 7.8* 7.3*  HCT 34.6* 32.6* 31.0* 24.7* 22.2*  MCV 96.9 95.9  --  99.2 94.9  -Expected a Post-Operative Drop. Checked Anemia Panel and showed an iron level of 14, UIBC of 304, TIBC 318, saturation ratios of 4%, ferritin of 67, folate level of 13.5 and vitamin B12 level 336. CTM for S/Sx of  Bleeding; No overt bleeding noted. Repeat CBC in the AM  Gastritis/Esophagitis/GERD/GI Prophylaxis: Continue home PPI w/ Pantoprazole  40 mg po BID  Hypoalbuminemia: Patient's Albumin  Lvl Trend went from 3.6 -> 3.7 -> 2.8 -> 2.6. CTM and Trend and repeat CMP in the AM   DVT prophylaxis: SCDs Start: 12/21/23 1434 SCDs Start: 12/20/23 2221    Code Status: Limited: Do not attempt resuscitation (DNR) -DNR-LIMITED -Do Not Intubate/DNI  Family Communication: D/w family present @ bedside  Disposition Plan:  Level of care: Telemetry Medical Status is: Inpatient Remains inpatient appropriate because: No family present @ bedside   Consultants:  Orthopedic Surgery  Procedures:  As delineated as above  Antimicrobials:  Anti-infectives (From admission, onward)    Start     Dose/Rate Route Frequency Ordered Stop   12/21/23 1630  ceFAZolin  (ANCEF ) IVPB 2g/100 mL premix        2 g 200 mL/hr over 30 Minutes Intravenous Every 6 hours 12/21/23 1433 12/21/23 2126   12/21/23 1015  ceFAZolin  (ANCEF ) IVPB 2g/100 mL premix        2 g 200 mL/hr over 30 Minutes Intravenous On call to O.R. 12/21/23 9083 12/21/23 1118       Subjective: Seen and examined at bedside and she was wearing oxygen now.  Family states that she was post be discharged with oxygen previously and she had refused.  Feels okay but continues to complain of pain.  No nausea or vomiting.  No other concerns or complaints at this time.  Objective: Vitals:   12/23/23 0841 12/23/23 1000 12/23/23 1559 12/23/23 2005  BP: 100/60  (!) 108/50 (!) 122/58  Pulse: 91  92 81  Resp: 16  16 16   Temp: 99 F (37.2 C)  98.8 F (37.1 C) 99.3 F (37.4 C)  TempSrc:    Oral  SpO2: (!) 84% 96% 97% 97%  Weight:      Height:        Intake/Output Summary (Last 24 hours) at 12/23/2023 2141 Last data filed at 12/23/2023 1000 Gross per 24 hour  Intake --  Output 850 ml  Net -850 ml   Filed Weights   12/20/23 1443 12/21/23 0928  Weight: 59 kg  58.9 kg   Examination: Physical Exam:  Constitutional: Alert chronically ill-appearing Caucasian female no acute distress Respiratory: Diminished to auscultation bilaterally with some breath sounds, no wheezing, rales, rhonchi or crackles. Normal respiratory effort and patient is not tachypenic. No accessory muscle use.  Unlabored breathing but is wearing supplemental oxygen via nasal cannula now Cardiovascular: RRR, no murmurs / rubs / gallops. S1 and S2 auscultated. No extremity edema.  Abdomen: Soft, non-tender, non-distended. Bowel sounds positive.  GU: Deferred. Musculoskeletal: No clubbing / cyanosis of digits/nails. No joint deformity upper and lower extremities.  Skin: No rashes, lesions, ulcers on limited skin evaluation. No induration; Warm and dry.  Neurologic: CN 2-12 grossly intact with no focal deficits. Romberg sign and cerebellar reflexes not assessed.  Psychiatric: She is awake and alert  Data Reviewed: I have personally reviewed following labs and imaging studies  CBC: Recent Labs  Lab 12/20/23 2143 12/21/23 0734 12/21/23  1411 12/22/23 0954 12/23/23 1104  WBC 11.9* 12.3*  --  10.2 8.3  NEUTROABS 9.9*  --   --  7.3 6.5  HGB 11.0* 10.5* 10.5* 7.8* 7.3*  HCT 34.6* 32.6* 31.0* 24.7* 22.2*  MCV 96.9 95.9  --  99.2 94.9  PLT 324 345  --  259 262   Basic Metabolic Panel: Recent Labs  Lab 12/20/23 1903 12/21/23 0734 12/21/23 1411 12/22/23 0954 12/23/23 1104  NA 135 138 137 130* 127*  K 4.6 3.9 4.0 4.4 4.0  CL 100 102 99 96* 97*  CO2 24 24  --  23 23  GLUCOSE 114* 118* 135* 102* 132*  BUN 12 11 11 13 15   CREATININE 0.99 0.88 0.80 1.04* 0.93  CALCIUM  8.9 8.8*  --  8.2* 8.0*  MG  --  1.9  --  1.8 2.1  PHOS  --  3.1  --  2.7 2.7   GFR: Estimated Creatinine Clearance: 41.5 mL/min (by C-G formula based on SCr of 0.93 mg/dL). Liver Function Tests: Recent Labs  Lab 12/20/23 1903 12/21/23 0900 12/22/23 0954 12/23/23 1104  AST 28 27 41 45*  ALT 16 16 15 13    ALKPHOS 86 90 64 65  BILITOT 0.8 0.6 0.5 0.5  PROT 6.6 6.8 5.7* 5.9*  ALBUMIN  3.6 3.7 2.8* 2.6*   No results for input(s): LIPASE, AMYLASE in the last 168 hours. No results for input(s): AMMONIA in the last 168 hours. Coagulation Profile: Recent Labs  Lab 12/21/23 0734  INR 1.0   Cardiac Enzymes: Recent Labs  Lab 12/20/23 2143  CKTOTAL 338*   BNP (last 3 results) Recent Labs    09/12/23 0859  PROBNP 63.0   HbA1C: No results for input(s): HGBA1C in the last 72 hours. CBG: No results for input(s): GLUCAP in the last 168 hours. Lipid Profile: No results for input(s): CHOL, HDL, LDLCALC, TRIG, CHOLHDL, LDLDIRECT in the last 72 hours. Thyroid  Function Tests: No results for input(s): TSH, T4TOTAL, FREET4, T3FREE, THYROIDAB in the last 72 hours. Anemia Panel: Recent Labs    12/23/23 1104  VITAMINB12 336  FOLATE 13.5  FERRITIN 67  TIBC 318  IRON 14*  RETICCTPCT 1.9   Sepsis Labs: No results for input(s): PROCALCITON, LATICACIDVEN in the last 168 hours.  Recent Results (from the past 240 hours)  Surgical pcr screen     Status: None   Collection Time: 12/21/23  9:34 AM   Specimen: Nasal Mucosa; Nasal Swab  Result Value Ref Range Status   MRSA, PCR NEGATIVE NEGATIVE Final   Staphylococcus aureus NEGATIVE NEGATIVE Final    Comment: (NOTE) The Xpert SA Assay (FDA approved for NASAL specimens in patients 78 years of age and older), is one component of a comprehensive surveillance program. It is not intended to diagnose infection nor to guide or monitor treatment. Performed at [facility], [address], [place], [place] [address]     Radiology Studies: DG CHEST PORT 1 VIEW Result Date: 12/23/2023 CLINICAL DATA:  Shortness of breath. EXAM: PORTABLE CHEST 1 VIEW COMPARISON:  12/20/2023 FINDINGS: The cardiac silhouette, mediastinal and hilar contours are within normal limits and stable given the AP projection and portable  technique. Moderate deviation of the trachea right words is stable finding and likely due to thyroid . Low lung volumes with vascular crowding and streaky basilar atelectasis. Could not exclude developing pulmonary infiltrates. No pleural effusions. The bony thorax is intact. IMPRESSION: Low lung volumes with vascular crowding and streaky basilar atelectasis. Could  not exclude developing pulmonary infiltrates. Electronically Signed   By: MYRTIS Stammer M.D.   On: 12/23/2023 16:38   Scheduled Meds:  atorvastatin   10 mg Oral Daily   cholecalciferol   1,000 Units Oral Daily   docusate sodium   100 mg Oral BID    HYDROmorphone  (DILAUDID ) injection  0.5 mg Intravenous Once   nortriptyline   150 mg Oral QHS   pantoprazole   40 mg Oral BID AC   PARoxetine   40 mg Oral Daily   sodium chloride  flush  3 mL Intravenous Q12H   Continuous Infusions:   LOS: 3 days   Alejandro Marker, DO [facility] Available via Epic secure chat 7am-7pm After these hours, please refer to coverage provider listed on [email or web address] 12/23/2023, 9:41 PM

## 2023-12-23 NOTE — Plan of Care (Signed)
   Problem: Education: Goal: Knowledge of General Education information will improve Description Including pain rating scale, medication(s)/side effects and non-pharmacologic comfort measures Outcome: Progressing   Problem: Health Behavior/Discharge Planning: Goal: Ability to manage health-related needs will improve Outcome: Progressing

## 2023-12-23 NOTE — Progress Notes (Incomplete)
 PROGRESS NOTE    Tiffany [name]  FMW:[id number] DOB: Jun 24, 1945 DOA: 12/20/2023 PCP: Avelina Greig BRAVO, MD   Brief Narrative:  Tiffany [name] is a 78 y.o. female with hx of hypertension, hyperlipidemia, gastritis/esophagitis, esophageal stenosis, anxiety, who presented after a ground-level fall.  She reports that she was standing at the end of the night and she slipped and fell.  Her only complaint is pain in the left hip denies other injuries. Orthopedic Surgery was consulted and took the patient for ORIF of the Left Complex Proximal Femur Fx and she is POD 3. Blood count did drop so will need to Monitor for Transfusion but now Hgb hovering in the low 7 range. PT/OT recommending SNF.   Hospitalization has been complicated by Hypoxia and patient continues to Desaturate.   Assessment and Plan:  Left intertrochanteric hip fracture s/p IM Rod POD 3 Clinical diagnosis Osteoporosis Ground-level fall -Orthopedic surgery consulted and took the pateint for ORIF on 7/10 -DVT prophylaxis per Ortho and they are recommending ASA 81 mg po BID x4 weeks; Patient has an ASA allergy listed but will need to verify this Allergy if it is a true allergy. SCD for now given drop in Hgb -Ortho recommending up w/ Therapy and recommending TDWB on LLE due to complex Nature of Fx -Tele monitoring periop -PT/ OT eval postop ordered and recommending SNF -Pain mgmt: Acetaminophen  1000 mg po q6h prn for Mild Pain, Methocarbamol   500 mg po q8h as needed spasm, Oxycodone  IR 5-10 mg po q4hprn and 10-15 mg  p.o. every 4 hours prn for moderate/severe, Hydromorphone  0.5 mg IV every 4 hours prn for breakthrough -Check vitamin D  and was 33.27; was started on Vitamin D3 1000 IU daily -Will need follow-up with PCP for further treatment of osteoporosis -She had a Leukocytosis (WBC went from 11.9 -> 12.3 -> 10.2 -> 8.3 -> 10.2) and likely reactive.  -Orthopedic Surgery recommending TdWB for 2-4 weeks and then advant to 50% WB b/w  4-6 week standpoint after surgery.  -Bowel regimen initiated and will c/w Miralax  17 grams po Daily prn and Docusate 100 mg po BID. CTM and further care per Orthopedic Sx -PT/OT recommending SNF   ? Short-term Memory Deficit: After discussing initial presentation and plan she was very unclear about these details. However fully oriented. Delirium Precautions   Acute Respiratory Failure with Hypoxia: Was wearing 4 to 5 L of supplemental oxygen.  Chest x-ray done and showed Low lung volumes with vascular crowding and streaky basilar atelectasis. Could not exclude developing pulmonary infiltrates.  Add flutter valve, incentive spirometry, guaifenesin .  As albuterol  2.5 mg nebs every 4 as needed for wheezing or shortness of breath. Wean O2 as tolerated. SpO2: 100 % O2 Flow Rate (L/min): 3.5 L/min. Will hold off starting Abx for now -CXR this AM done showed No acute findings. Linear atelectasis at both lung bases with slightly improved aeration. -Continues to take her O2 off. If hypoxia persists will consider CTA of the Chest to r/o PE but will get D-Dimer first. Likely will need Supplemental O2 @ D/C.   Essential Hypertension: Not on antihypertensives at time of admission. CTM BP per Protocol. Last BP reading was a little soft at 109/57  Hyperlipidemia: C/w Home Atorvastatin  10 mg po Daily   History Esophageal Stricture: Noted  Renal Insuffiencey: BUN/Cr Trend: Recent Labs  Lab 12/20/23 1903 12/21/23 0734 12/21/23 1411 12/22/23 0954 12/23/23 1104 12/24/23 0826  BUN 12 11 11 13 15 8   CREATININE 0.99 0.88  0.80 1.04* 0.93 0.72  -Avoid Nephrotoxic Medications, Contrast Dyes, Hypotension and Dehydration to Ensure Adequate Renal Perfusion and will need to Renally Adjust Meds -Continue to Monitor and Trend Renal Function carefully and repeat CMP in the AM   HypoNatremia: Na+ Trend:  Recent Labs  Lab 12/20/23 1903 12/21/23 0734 12/21/23 1411 12/22/23 0954 12/23/23 1104 12/24/23 0826  NA  135 138 137 130* 127* 133*  -CTM and Trend and repeat CMP in the AM  Dysphagia: SLP evaluated and recommending a Dysphagia 3 Diet and recommending an Esophagram which has been ordered  Mood Disorder/Anxiety/Insomnia: C/w home Paroxetine  40 mg po Daily, Buspirone  5 mg TIDprn Anxiety, Nortriptyline  nightly and Melatonin 6 mg po qHS  ABLA due to Post-operative drop /Normocytic Anemia: Hgb/Hct Trend:  Recent Labs  Lab 12/20/23 2143 12/21/23 0734 12/21/23 1411 12/22/23 0954 12/23/23 1104 12/24/23 0826  HGB 11.0* 10.5* 10.5* 7.8* 7.3* 7.4*  HCT 34.6* 32.6* 31.0* 24.7* 22.2* 23.4*  MCV 96.9 95.9  --  99.2 94.9 96.7  -Expected a Post-Operative Drop and now Hgb hovering in the low 7 range. Checked Anemia Panel and showed an iron level of 14, UIBC of 304, TIBC 318, saturation ratios of 4%, ferritin of 67, folate level of 13.5 and vitamin B12 level 336. CTM for S/Sx of Bleeding; No overt bleeding noted. Repeat CBC in the AM  Gastritis/Esophagitis/GERD/GI Prophylaxis: Continue home PPI w/ Pantoprazole  40 mg po BID  Hypoalbuminemia: Patient's Albumin  Lvl Trend went from 3.6 -> 3.7 -> 2.8 -> 2.6 x2. CTM and Trend and repeat CMP in the AM    DVT prophylaxis: SCDs Start: 12/21/23 1434 SCDs Start: 12/20/23 2221    Code Status: Limited: Do not attempt resuscitation (DNR) -DNR-LIMITED -Do Not Intubate/DNI  Family Communication: No family present @ bedside   Disposition Plan:  Level of care: Telemetry Medical Status is: Inpatient Remains inpatient appropriate because: Needs SNF and we need to ensure stability of Hgb   Consultants:  Orthopedic Surgery   Procedures:  As delineated as above   Antimicrobials:  Anti-infectives (From admission, onward)    Start     Dose/Rate Route Frequency Ordered Stop   12/21/23 1630  ceFAZolin  (ANCEF ) IVPB 2g/100 mL premix        2 g 200 mL/hr over 30 Minutes Intravenous Every 6 hours 12/21/23 1433 12/21/23 2126   12/21/23 1015  ceFAZolin  (ANCEF ) IVPB  2g/100 mL premix        2 g 200 mL/hr over 30 Minutes Intravenous On call to O.R. 12/21/23 9083 12/21/23 1118       Subjective: Seen and examined at bedside and continued to remove her supplemental oxygen.  While being on room air she desaturates to the mid 80s when we on oxygen maintains her saturations.  She denies any complaints but continues to be sore and complains of leg pain and has not really moved.  No other concerns or complaints at this time.  Objective: Vitals:   12/23/23 1559 12/23/23 2005 12/24/23 0808 12/24/23 1535  BP: (!) 108/50 (!) 122/58 117/68 (!) 109/57  Pulse: 92 81 86 93  Resp: 16 16 18 18   Temp: 98.8 F (37.1 C) 99.3 F (37.4 C) 98.7 F (37.1 C) 99 F (37.2 C)  TempSrc:  Oral  Rectal  SpO2: 97% 97% 100% 100%  Weight:      Height:        Intake/Output Summary (Last 24 hours) at 12/24/2023 1923 Last data filed at 12/24/2023 1541 Gross per 24 hour  Intake 240 ml  Output --  Net 240 ml   Filed Weights   12/20/23 1443 12/21/23 0928  Weight: 59 kg 58.9 kg   Examination: Physical Exam:  Constitutional: Chronically ill-appearing elderly Caucasian female in no acute distress Respiratory: Diminished to auscultation bilaterally with some coarse breath sounds, no wheezing, rales, rhonchi or crackles. Normal respiratory effort and patient is not tachypenic. No accessory muscle use.  Wearing supplemental oxygen via nasal cannula but is not in her nose. Cardiovascular: RRR, no murmurs / rubs / gallops. S1 and S2 auscultated. No extremity edema.  Abdomen: Soft, non-tender, non-distended. Bowel sounds positive.  GU: Deferred. Musculoskeletal: No clubbing / cyanosis of digits/nails. No joint deformity upper and lower extremities and her left leg is wrapped Skin: No rashes, lesions, ulcers on limited skin evaluation. No induration; Warm and dry.  Neurologic: CN 2-12 grossly intact with no focal deficits. Romberg sign and cerebellar reflexes not assessed.   Psychiatric: She is awake and alert.  Data Reviewed: I have personally reviewed following labs and imaging studies  CBC: Recent Labs  Lab 12/20/23 2143 12/21/23 0734 12/21/23 1411 12/22/23 0954 12/23/23 1104 12/24/23 0826  WBC 11.9* 12.3*  --  10.2 8.3 10.2  NEUTROABS 9.9*  --   --  7.3 6.5 7.9*  HGB 11.0* 10.5* 10.5* 7.8* 7.3* 7.4*  HCT 34.6* 32.6* 31.0* 24.7* 22.2* 23.4*  MCV 96.9 95.9  --  99.2 94.9 96.7  PLT 324 345  --  259 262 310   Basic Metabolic Panel: Recent Labs  Lab 12/20/23 1903 12/21/23 0734 12/21/23 1411 12/22/23 0954 12/23/23 1104 12/24/23 0826  NA 135 138 137 130* 127* 133*  K 4.6 3.9 4.0 4.4 4.0 4.1  CL 100 102 99 96* 97* 99  CO2 24 24  --  23 23 24   GLUCOSE 114* 118* 135* 102* 132* 131*  BUN 12 11 11 13 15 8   CREATININE 0.99 0.88 0.80 1.04* 0.93 0.72  CALCIUM  8.9 8.8*  --  8.2* 8.0* 8.3*  MG  --  1.9  --  1.8 2.1 2.0  PHOS  --  3.1  --  2.7 2.7 2.8   GFR: Estimated Creatinine Clearance: 48.2 mL/min (by C-G formula based on SCr of 0.72 mg/dL). Liver Function Tests: Recent Labs  Lab 12/20/23 1903 12/21/23 0900 12/22/23 0954 12/23/23 1104 12/24/23 0826  AST 28 27 41 45* 34  ALT 16 16 15 13 12   ALKPHOS 86 90 64 65 81  BILITOT 0.8 0.6 0.5 0.5 0.8  PROT 6.6 6.8 5.7* 5.9* 5.7*  ALBUMIN  3.6 3.7 2.8* 2.6* 2.6*   No results for input(s): LIPASE, AMYLASE in the last 168 hours. No results for input(s): AMMONIA in the last 168 hours. Coagulation Profile: Recent Labs  Lab 12/21/23 0734  INR 1.0   Cardiac Enzymes: Recent Labs  Lab 12/20/23 2143  CKTOTAL 338*   BNP (last 3 results) Recent Labs    09/12/23 0859  PROBNP 63.0   HbA1C: No results for input(s): HGBA1C in the last 72 hours. CBG: No results for input(s): GLUCAP in the last 168 hours. Lipid Profile: No results for input(s): CHOL, HDL, LDLCALC, TRIG, CHOLHDL, LDLDIRECT in the last 72 hours. Thyroid  Function Tests: No results for input(s): TSH,  T4TOTAL, FREET4, T3FREE, THYROIDAB in the last 72 hours. Anemia Panel: Recent Labs    12/23/23 1104  VITAMINB12 336  FOLATE 13.5  FERRITIN 67  TIBC 318  IRON 14*  RETICCTPCT 1.9   Sepsis Labs: No results  for input(s): PROCALCITON, LATICACIDVEN in the last 168 hours.  Recent Results (from the past 240 hours)  Surgical pcr screen     Status: None   Collection Time: 12/21/23  9:34 AM   Specimen: Nasal Mucosa; Nasal Swab  Result Value Ref Range Status   MRSA, PCR NEGATIVE NEGATIVE Final   Staphylococcus aureus NEGATIVE NEGATIVE Final    Comment: (NOTE) The Xpert SA Assay (FDA approved for NASAL specimens in patients 4 years of age and older), is one component of a comprehensive surveillance program. It is not intended to diagnose infection nor to guide or monitor treatment. Performed at [facility], [address], [place], [place] [address]     Radiology Studies: DG CHEST PORT 1 VIEW Result Date: 12/24/2023 EXAM: 1 VIEW XRAY OF THE CHEST 12/24/2023 06:43:12 AM COMPARISON: 1 view chest x-ray 12/23/2023 and 1 view chest x-ray 12/20/2023. CLINICAL HISTORY: [id number] SOB (shortness of breath). FINDINGS: LUNGS AND PLEURA: Areas of linear atelectasis are again noted at both lung bases. Aeration is slightly improved. Lung volumes remain low. No focal pulmonary opacity. No pulmonary edema. No pleural effusion. No pneumothorax. HEART AND MEDIASTINUM: No acute abnormality of the cardiac and mediastinal silhouettes. BONES AND SOFT TISSUES: No acute osseous abnormality. IMPRESSION: 1. No acute findings. 2. Linear atelectasis at both lung bases with slightly improved aeration. Electronically signed by: Lonni Necessary MD 12/24/2023 08:40 AM EDT RP Workstation: [id number]   DG CHEST PORT 1 VIEW Result Date: 12/23/2023 CLINICAL DATA:  Shortness of breath. EXAM: PORTABLE CHEST 1 VIEW COMPARISON:  12/20/2023 FINDINGS: The cardiac silhouette, mediastinal and hilar contours are  within normal limits and stable given the AP projection and portable technique. Moderate deviation of the trachea right words is stable finding and likely due to thyroid . Low lung volumes with vascular crowding and streaky basilar atelectasis. Could not exclude developing pulmonary infiltrates. No pleural effusions. The bony thorax is intact. IMPRESSION: Low lung volumes with vascular crowding and streaky basilar atelectasis. Could not exclude developing pulmonary infiltrates. Electronically Signed   By: MYRTIS Stammer M.D.   On: 12/23/2023 16:38   Scheduled Meds:  atorvastatin   10 mg Oral Daily   cholecalciferol   1,000 Units Oral Daily   docusate sodium   100 mg Oral BID    HYDROmorphone  (DILAUDID ) injection  0.5 mg Intravenous Once   nortriptyline   150 mg Oral QHS   pantoprazole   40 mg Oral BID AC   PARoxetine   40 mg Oral Daily   sodium chloride  flush  3 mL Intravenous Q12H   Continuous Infusions:   LOS: 4 days   Alejandro Marker, DO [facility] Available via Epic secure chat 7am-7pm After these hours, please refer to coverage provider listed on [email or web address] 12/24/2023, 7:23 PM

## 2023-12-23 NOTE — TOC Initial Note (Signed)
 Transition of Care [facility]) - Initial/Assessment Note    Patient Details  Name: Tiffany [name] MRN: [id number] Date of Birth: [birth year]  Transition of Care [facility]) CM/SW Contact:    Inocente GORMAN Kindle, LCSW Phone Number: 12/23/2023, 12:19 PM  Clinical Narrative:                 CSW received consult for possible SNF placement at time of discharge. CSW spoke with patient. Patient reported that patient's daughter is currently unable to care for patient at their home given patient's current physical needs and fall risk. Patient expressed understanding of PT recommendation and is agreeable to SNF placement at time of discharge. Patient reports preference for [facility]. CSW discussed insurance authorization process and will provide Medicare SNF ratings list. CSW will send out referrals for review and provide bed offers as available.   Skilled Nursing Rehab Facilities-   [email or web address]   Ratings out of 5 stars (5 the highest)  Name Address  Phone # Quality Care Staffing Health Inspection Overall  [facility] [address] [place] [phone] 3 1 4 3   [facility] [address] [place] [phone] 5 2 4 5   [facility] [address], [place] [facility] [phone] 2 1 1 1   [facility] [address], [place] [phone] 4 3 4 4   [facility]  [address], [place] [phone] 5 3 5 5   [facility] [address], [facility] [phone] [facility] [address] [place] [phone] 5 1 2 2   [facility] [address] [place] [phone] 2 3 4 4   [address] ([facility]) [address], [place] [phone] 1 3 3 2   [facility] [address] [place], [place] [phone] 3 1 2 1   [facility] [address], [place] [phone] 3 1 1 1   Clotilda Pereyra [address] [place] [phone] 3 3 4 4   [facility] [address], [place] [phone] 4 1 2 1   [facility] ([facility]) [address], [place] [phone] 1 1 4 2           [facility] [address] [place] [phone] 3 1 5 4   [facility] [address], [place] [phone] 4 1 1 1   [facility]  [address], [place] [phone] 2 3 1 1   [facility]  [address] [phone] 2 2 4 4   [facility] [address] [place] [address], [place] [phone] 2 2 3 3           [facility] [address], [place] [phone] 2 1 2 1   [facility] (No [facility]) [address], [place] [phone] 5 4 4 5   [facility] [address], [facility] [phone] 3 4 4 4   [facility] [address], [place] [phone] 3 1 2 1   [facility] [address] [place] [phone] 3 2 2 2   [facility] [address], [place] [phone] 1 3 3 2   [facility] [address] [place] [phone] 2 2 3 3   [facility] [address] [place], [place] [phone] 2 1 4 3   [facility] [address], [facility] [phone] 2 1 1 1   [facility] [address] [place], [place] [phone] 2 1 2 1   [facility] [address] [place] [phone] 3 1 2  1  [facility] [address] [place] [phone] 4 1 2 1   [facility] [address], [place]  [phone] 3 4 4 4   [facility] (No [employer]) [address] [place] [phone] 3 2 5 5   [facility]) [address], [place] [phone] 2 2 3 3   [facility] [place] [address] [place] [phone] 5 3 5 5   [facility] [address], [place] [phone] 2 1 1 1   [facility] [address] [place], [place] [phone] 3 5 5 5           [facility] [address] [place], [place] [phone] 5 4 5 5   [facility])  [address], [place] [phone] 1 1 2 1   [facility]) [address] [place] [phone]  2 4 4   [facility] [address] [place] [phone] 3 5 5 5   [address] [place], [place] [phone] 4 2 2 2   [facility]) [address] [place] [phone] 1 1 3 1   [facility] [address] [place] [phone] 2 2 2 2      Expected Discharge Plan: Skilled Nursing Facility Barriers to Discharge: Continued Medical Work up, English as a second language teacher, SNF Pending bed offer   Patient Goals and CMS Choice Patient states their goals for this hospitalization and ongoing recovery are:: Rehab CMS Medicare.gov Compare Post Acute Care list provided to:: Patient Choice offered to / list presented to : Patient [place] ownership interest in [facility].provided to:: Patient    Expected Discharge Plan and Services In-house Referral: Clinical Social Work   Post Acute Care Choice: Skilled Nursing Facility Living arrangements for the past 2 months: Single Family Home                                      Prior Living Arrangements/Services Living arrangements for the past 2 months: Single Family Home Lives with:: Self Patient language and need for interpreter reviewed:: Yes Do you feel safe going back to the place where you live?: Yes      Need for Family Participation in Patient Care: Yes (Comment) Care giver support system in place?: Yes (comment)   Criminal Activity/Legal Involvement Pertinent to Current Situation/Hospitalization: No - Comment as needed  Activities of Daily Living   ADL Screening (condition at time of admission) Independently performs ADLs?: Yes (appropriate for developmental age) Is the patient deaf or have difficulty hearing?: No Does the patient have difficulty seeing, even when wearing glasses/contacts?: No Does the patient have difficulty concentrating, remembering, or  making decisions?: No  Permission Sought/Granted Permission sought to share information with : Facility Medical sales representative, Family Supports Permission granted to share information with : Yes, Verbal Permission Granted  Share Information with NAME: Earnie  Permission granted to share info w AGENCY: SNFs  Permission granted to share info w Relationship: Daughter  Permission granted to share info w Contact Information: [phone]  Emotional Assessment Appearance:: Appears stated age Attitude/Demeanor/Rapport: Engaged Affect (typically observed): Accepting, Appropriate Orientation: : Oriented to Self, Oriented to Place, Oriented to  Time, Oriented to Situation Alcohol / Substance Use: Not Applicable Psych Involvement: No (comment)  Admission diagnosis:  Closed fracture of left hip, initial encounter (HCC) [S72.002A] Fall, initial encounter [W19.XXXA] Closed intertrochanteric fracture of left hip, initial encounter [facility]) [S72.142A] Patient Active Problem List  Diagnosis Date Noted   Closed intertrochanteric fracture of left hip, initial encounter (HCC) 12/20/2023   Gastrointestinal hemorrhage associated with acute gastritis 10/09/2023   Benign esophageal stricture 09/05/2023   Gastritis and gastroduodenitis 09/05/2023   Hematemesis 09/04/2023   ABLA (acute blood loss anemia) 09/04/2023   Esophageal dysphagia 09/04/2023   Numbness and tingling in both hands 08/08/2023   Cold intolerance 08/08/2023   Normocytic anemia 08/08/2023   Blood in stool 05/16/2023   Swelling 03/10/2021   Lumbar radiculopathy 03/05/2021   Bilateral hearing loss 03/05/2021   Rotator cuff arthropathy of right shoulder 10/05/2020   Aortic atherosclerosis (HCC) 07/16/2020   GAD (generalized anxiety disorder) 02/28/2019   DDD (degenerative disc disease), cervical 09/15/2016   History of anemia 09/15/2016   Heart palpitations 07/02/2015   Frequent unifocal PVCs 07/02/2015   Counseling regarding end of  life decision making 05/01/2014   Chronic back pain 01/22/2012   OA (osteoarthritis) 03/27/2011   Hiatal hernia 03/24/2011   Depression, major, recurrent (HCC) 07/13/2010   Essential hypertension, benign 04/06/2010   DERMATITIS, CONTACT, NOS 02/23/2007   Hyperlipidemia LDL goal <70 10/18/2006   Mitral valve disorder 10/18/2006   GERD 10/18/2006   BAKER'S CYST, LEFT KNEE 10/18/2006   Osteoporosis 07/15/1999   PCP:  Avelina Greig BRAVO, MD Pharmacy:   [facility] [address] [place] [address] Phone: [phone] Fax: [phone]     Social Drivers of Health (SDOH) Social History: SDOH Screenings   Food Insecurity: No Food Insecurity (12/21/2023)  Housing: Low Risk  (12/21/2023)  Transportation Needs: No Transportation Needs (12/21/2023)  Utilities: Not At Risk (12/21/2023)  Alcohol Screen: Low Risk  (10/07/2021)  Depression (PHQ2-9): Low Risk  (11/15/2023)  Financial Resource Strain: Low Risk  (10/07/2021)  Physical Activity: Inactive (10/07/2021)  Social Connections: Moderately Integrated (12/21/2023)  Stress: No Stress Concern Present (10/07/2021)  Tobacco Use: Low Risk  (12/21/2023)   SDOH Interventions:     Readmission Risk Interventions     No data to display

## 2023-12-23 NOTE — NC FL2 (Signed)
 [place]  MEDICAID FL2 LEVEL OF CARE FORM     IDENTIFICATION  Patient Name: Tiffany [name] Birthdate: Apr 18, 1946 Sex: female Admission Date (Current Location): 12/20/2023  [facility] and [place] Number:  Producer, television/film/video and Address:  [facility] [place], [address] [place], [place] [address]      Provider Number: [id number]  Attending Physician Name and Address:  Sherrill Alejandro Donovan, DO  Relative Name and Phone Number:       Current Level of Care: Hospital Recommended Level of Care: Skilled Nursing Facility Prior Approval Number:    Date Approved/Denied:   PASRR Number: [id number]  Discharge Plan: SNF    Current Diagnoses: Patient Active Problem List   Diagnosis Date Noted   Closed intertrochanteric fracture of left hip, initial encounter (HCC) 12/20/2023   Gastrointestinal hemorrhage associated with acute gastritis 10/09/2023   Benign esophageal stricture 09/05/2023   Gastritis and gastroduodenitis 09/05/2023   Hematemesis 09/04/2023   ABLA (acute blood loss anemia) 09/04/2023   Esophageal dysphagia 09/04/2023   Numbness and tingling in both hands 08/08/2023   Cold intolerance 08/08/2023   Normocytic anemia 08/08/2023   Blood in stool 05/16/2023   Swelling 03/10/2021   Lumbar radiculopathy 03/05/2021   Bilateral hearing loss 03/05/2021   Rotator cuff arthropathy of right shoulder 10/05/2020   Aortic atherosclerosis (HCC) 07/16/2020   GAD (generalized anxiety disorder) 02/28/2019   DDD (degenerative disc disease), cervical 09/15/2016   History of anemia 09/15/2016   Heart palpitations 07/02/2015   Frequent unifocal PVCs 07/02/2015   Counseling regarding end of life decision making 05/01/2014   Chronic back pain 01/22/2012   OA (osteoarthritis) 03/27/2011   Hiatal hernia 03/24/2011   Depression, major, recurrent (HCC) 07/13/2010   Essential hypertension, benign 04/06/2010   DERMATITIS, CONTACT, NOS 02/23/2007   Hyperlipidemia LDL  goal <70 10/18/2006   Mitral valve disorder 10/18/2006   GERD 10/18/2006   BAKER'S CYST, LEFT KNEE 10/18/2006   Osteoporosis 07/15/1999    Orientation RESPIRATION BLADDER Height & Weight     Self, Situation, Time, Place  O2 (4L nasal cannula) Continent Weight: 129 lb 13.6 oz (58.9 kg) Height:  5' 3.5 (161.3 cm)  BEHAVIORAL SYMPTOMS/MOOD NEUROLOGICAL BOWEL NUTRITION STATUS      Continent Diet (See dc summary)  AMBULATORY STATUS COMMUNICATION OF NEEDS Skin   Extensive Assist Verbally Surgical wounds (closed incision on left leg)                       Personal Care Assistance Level of Assistance  Bathing, Feeding, Dressing Bathing Assistance: Maximum assistance Feeding assistance: Limited assistance Dressing Assistance: Limited assistance     Functional Limitations Info  Sight, Hearing Sight Info: Impaired (Reading glasses) Hearing Info: Impaired      SPECIAL CARE FACTORS FREQUENCY  PT (By licensed PT), OT (By licensed OT)     PT Frequency: 5x/week-  touchdown weightbearing on her left lower extremity OT Frequency: 5x/week            Contractures Contractures Info: Not present    Additional Factors Info  Code Status, Allergies, Psychotropic Code Status Info: DNR Limited: Do not attempt resuscitation (DNR) -DNR-LIMITED -Do Not Intubate/DNI Allergies Info: Aspirin, Latex, Allegra (Fexofenadine), Nsaids, Tramadol, Zolpidem Tartrate Psychotropic Info: Paxil          Current Medications (12/23/2023):  This is the current hospital active medication list Current Facility-Administered Medications  Medication Dose Route Frequency Provider Last Rate Last Admin   acetaminophen  (TYLENOL )  tablet 1,000 mg  1,000 mg Oral Q6H PRN Vernetta Lonni GRADE, MD       acetaminophen  (TYLENOL ) tablet 325-650 mg  325-650 mg Oral Q6H PRN Vernetta Lonni GRADE, MD   325 mg at 12/22/23 1611   albuterol  (PROVENTIL ) (2.5 MG/3ML) 0.083% nebulizer solution 2.5 mg  2.5 mg Nebulization  Q4H PRN Vernetta Lonni GRADE, MD       atorvastatin  (LIPITOR) tablet 10 mg  10 mg Oral Daily Vernetta Lonni GRADE, MD   10 mg at 12/23/23 1016   busPIRone  (BUSPAR ) tablet 5 mg  5 mg Oral TID PRN Vernetta Lonni GRADE, MD       cholecalciferol  (VITAMIN D3) 25 MCG (1000 UNIT) tablet 1,000 Units  1,000 Units Oral Daily Vernetta Lonni GRADE, MD   1,000 Units at 12/23/23 1015   docusate sodium  (COLACE) capsule 100 mg  100 mg Oral BID Vernetta Lonni GRADE, MD   100 mg at 12/23/23 1016   HYDROmorphone  (DILAUDID ) injection 0.5 mg  0.5 mg Intravenous Once Vernetta Lonni GRADE, MD       HYDROmorphone  (DILAUDID ) injection 0.5 mg  0.5 mg Intravenous Q4H PRN Vernetta Lonni GRADE, MD   0.5 mg at 12/22/23 1930   melatonin tablet 6 mg  6 mg Oral QHS PRN Vernetta Lonni GRADE, MD       menthol -cetylpyridinium (CEPACOL) lozenge 3 mg  1 lozenge Oral PRN Vernetta Lonni GRADE, MD       Or   phenol (CHLORASEPTIC) mouth spray 1 spray  1 spray Mouth/Throat PRN Vernetta Lonni GRADE, MD       methocarbamol  (ROBAXIN ) tablet 500 mg  500 mg Oral Q8H PRN Vernetta Lonni GRADE, MD   500 mg at 12/22/23 2232   metoCLOPramide  (REGLAN ) tablet 5-10 mg  5-10 mg Oral Q8H PRN Vernetta Lonni GRADE, MD       Or   metoCLOPramide  (REGLAN ) injection 5-10 mg  5-10 mg Intravenous Q8H PRN Vernetta Lonni GRADE, MD       nortriptyline  (PAMELOR ) capsule 150 mg  150 mg Oral QHS Vernetta Lonni GRADE, MD   150 mg at 12/22/23 2232   ondansetron  (ZOFRAN ) injection 4 mg  4 mg Intravenous Q6H PRN Vernetta Lonni GRADE, MD   4 mg at 12/23/23 0932   oxyCODONE  (Oxy IR/ROXICODONE ) immediate release tablet 10-15 mg  10-15 mg Oral Q4H PRN Vernetta Lonni GRADE, MD   10 mg at 12/23/23 0604   oxyCODONE  (Oxy IR/ROXICODONE ) immediate release tablet 5-10 mg  5-10 mg Oral Q4H PRN Vernetta Lonni GRADE, MD       pantoprazole  (PROTONIX ) EC tablet 40 mg  40 mg Oral BID AC Blackman, Christopher Y, MD   40 mg at 12/23/23 1015    PARoxetine  (PAXIL ) tablet 40 mg  40 mg Oral Daily Vernetta Lonni GRADE, MD   40 mg at 12/23/23 1014   polyethylene glycol (MIRALAX  / GLYCOLAX ) packet 17 g  17 g Oral Daily PRN Vernetta Lonni GRADE, MD       sodium chloride  flush (NS) 0.9 % injection 3 mL  3 mL Intravenous Q12H Vernetta Lonni GRADE, MD   3 mL at 12/23/23 9065     Discharge Medications: Please see discharge summary for a list of discharge medications.  Relevant Imaging Results:  Relevant Lab Results:   Additional Information SSN: [id number]  Inocente RAMAN Kahlel Peake, LCSW

## 2023-12-24 ENCOUNTER — Inpatient Hospital Stay (HOSPITAL_COMMUNITY)

## 2023-12-24 DIAGNOSIS — R0602 Shortness of breath: Secondary | ICD-10-CM | POA: Diagnosis not present

## 2023-12-24 DIAGNOSIS — J9601 Acute respiratory failure with hypoxia: Secondary | ICD-10-CM

## 2023-12-24 DIAGNOSIS — R0989 Other specified symptoms and signs involving the circulatory and respiratory systems: Secondary | ICD-10-CM | POA: Diagnosis not present

## 2023-12-24 DIAGNOSIS — J9811 Atelectasis: Secondary | ICD-10-CM | POA: Diagnosis not present

## 2023-12-24 DIAGNOSIS — S72142A Displaced intertrochanteric fracture of left femur, initial encounter for closed fracture: Secondary | ICD-10-CM | POA: Diagnosis not present

## 2023-12-24 DIAGNOSIS — D62 Acute posthemorrhagic anemia: Secondary | ICD-10-CM | POA: Diagnosis not present

## 2023-12-24 LAB — CBC WITH DIFFERENTIAL/PLATELET
Abs Immature Granulocytes: 0.04 K/uL (ref 0.00–0.07)
Basophils Absolute: 0 K/uL (ref 0.0–0.1)
Basophils Relative: 0 %
Eosinophils Absolute: 0.2 K/uL (ref 0.0–0.5)
Eosinophils Relative: 2 %
HCT: 23.4 % — ABNORMAL LOW (ref 36.0–46.0)
Hemoglobin: 7.4 g/dL — ABNORMAL LOW (ref 12.0–15.0)
Immature Granulocytes: 0 %
Lymphocytes Relative: 11 %
Lymphs Abs: 1.1 K/uL (ref 0.7–4.0)
MCH: 30.6 pg (ref 26.0–34.0)
MCHC: 31.6 g/dL (ref 30.0–36.0)
MCV: 96.7 fL (ref 80.0–100.0)
Monocytes Absolute: 0.9 K/uL (ref 0.1–1.0)
Monocytes Relative: 9 %
Neutro Abs: 7.9 K/uL — ABNORMAL HIGH (ref 1.7–7.7)
Neutrophils Relative %: 78 %
Platelets: 310 K/uL (ref 150–400)
RBC: 2.42 MIL/uL — ABNORMAL LOW (ref 3.87–5.11)
RDW: 13.7 % (ref 11.5–15.5)
WBC: 10.2 K/uL (ref 4.0–10.5)
nRBC: 0 % (ref 0.0–0.2)

## 2023-12-24 LAB — COMPREHENSIVE METABOLIC PANEL WITH GFR
ALT: 12 U/L (ref 0–44)
AST: 34 U/L (ref 15–41)
Albumin: 2.6 g/dL — ABNORMAL LOW (ref 3.5–5.0)
Alkaline Phosphatase: 81 U/L (ref 38–126)
Anion gap: 10 (ref 5–15)
BUN: 8 mg/dL (ref 8–23)
CO2: 24 mmol/L (ref 22–32)
Calcium: 8.3 mg/dL — ABNORMAL LOW (ref 8.9–10.3)
Chloride: 99 mmol/L (ref 98–111)
Creatinine, Ser: 0.72 mg/dL (ref 0.44–1.00)
GFR, Estimated: >60 mL/min (ref >60)
Glucose, Bld: 131 mg/dL — ABNORMAL HIGH (ref 70–99)
Potassium: 4.1 mmol/L (ref 3.5–5.1)
Sodium: 133 mmol/L — ABNORMAL LOW (ref 135–145)
Total Bilirubin: 0.8 mg/dL (ref 0.0–1.2)
Total Protein: 5.7 g/dL — ABNORMAL LOW (ref 6.5–8.1)

## 2023-12-24 LAB — PHOSPHORUS: Phosphorus: 2.8 mg/dL (ref 2.5–4.6)

## 2023-12-24 LAB — MAGNESIUM: Magnesium: 2 mg/dL (ref 1.7–2.4)

## 2023-12-24 NOTE — Progress Notes (Signed)
 Patient ID: Tiffany [name], female   DOB: Dec 03, 1945, 78 y.o.   MRN: [id number] The patient is awake and alert.  She is eating breakfast.  Her vital signs are stable.  I did change the dressings along her left hip and distal thigh area.  The incisions are clean and dry.  Her hemoglobin yesterday was down to 7.3 but thus far she seems to be tolerating this.  She does live alone.  Mobility is significant limited since we have right just touchdown weightbearing until there are some signs of fracture healing.  She will likely be touchdown weightbearing for the next 2 to 4 weeks and will hopefully be advanced to 50% weightbearing between the 4 to 6-week standpoint after surgery.  She will likely need short-term skilled nursing in the interim since she does live alone.
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
## 2023-12-24 NOTE — Plan of Care (Signed)
   Problem: Education: Goal: Knowledge of General Education information will improve Description: Including pain rating scale, medication(s)/side effects and non-pharmacologic comfort measures Outcome: Progressing   Problem: Clinical Measurements: Goal: Cardiovascular complication will be avoided Outcome: Progressing

## 2023-12-24 NOTE — Plan of Care (Signed)
   Problem: Education: Goal: Knowledge of General Education information will improve Description Including pain rating scale, medication(s)/side effects and non-pharmacologic comfort measures Outcome: Progressing   Problem: Health Behavior/Discharge Planning: Goal: Ability to manage health-related needs will improve Outcome: Progressing

## 2023-12-25 ENCOUNTER — Inpatient Hospital Stay (HOSPITAL_COMMUNITY)

## 2023-12-25 DIAGNOSIS — J9601 Acute respiratory failure with hypoxia: Secondary | ICD-10-CM | POA: Diagnosis not present

## 2023-12-25 DIAGNOSIS — D62 Acute posthemorrhagic anemia: Secondary | ICD-10-CM | POA: Diagnosis not present

## 2023-12-25 DIAGNOSIS — S72142A Displaced intertrochanteric fracture of left femur, initial encounter for closed fracture: Secondary | ICD-10-CM | POA: Diagnosis not present

## 2023-12-25 LAB — CBC WITH DIFFERENTIAL/PLATELET
Abs Immature Granulocytes: 0.06 K/uL (ref 0.00–0.07)
Basophils Absolute: 0 K/uL (ref 0.0–0.1)
Basophils Relative: 0 %
Eosinophils Absolute: 0.4 K/uL (ref 0.0–0.5)
Eosinophils Relative: 4 %
HCT: 23.9 % — ABNORMAL LOW (ref 36.0–46.0)
Hemoglobin: 7.7 g/dL — ABNORMAL LOW (ref 12.0–15.0)
Immature Granulocytes: 1 %
Lymphocytes Relative: 11 %
Lymphs Abs: 1 K/uL (ref 0.7–4.0)
MCH: 31.4 pg (ref 26.0–34.0)
MCHC: 32.2 g/dL (ref 30.0–36.0)
MCV: 97.6 fL (ref 80.0–100.0)
Monocytes Absolute: 0.9 K/uL (ref 0.1–1.0)
Monocytes Relative: 10 %
Neutro Abs: 6.8 K/uL (ref 1.7–7.7)
Neutrophils Relative %: 74 %
Platelets: 321 K/uL (ref 150–400)
RBC: 2.45 MIL/uL — ABNORMAL LOW (ref 3.87–5.11)
RDW: 13.9 % (ref 11.5–15.5)
WBC: 9.3 K/uL (ref 4.0–10.5)
nRBC: 0 % (ref 0.0–0.2)

## 2023-12-25 LAB — COMPREHENSIVE METABOLIC PANEL WITH GFR
ALT: 12 U/L (ref 0–44)
AST: 27 U/L (ref 15–41)
Albumin: 2.5 g/dL — ABNORMAL LOW (ref 3.5–5.0)
Alkaline Phosphatase: 75 U/L (ref 38–126)
Anion gap: 10 (ref 5–15)
BUN: 7 mg/dL — ABNORMAL LOW (ref 8–23)
CO2: 26 mmol/L (ref 22–32)
Calcium: 8.4 mg/dL — ABNORMAL LOW (ref 8.9–10.3)
Chloride: 94 mmol/L — ABNORMAL LOW (ref 98–111)
Creatinine, Ser: 0.78 mg/dL (ref 0.44–1.00)
GFR, Estimated: >60 mL/min (ref >60)
Glucose, Bld: 107 mg/dL — ABNORMAL HIGH (ref 70–99)
Potassium: 4.1 mmol/L (ref 3.5–5.1)
Sodium: 130 mmol/L — ABNORMAL LOW (ref 135–145)
Total Bilirubin: 0.7 mg/dL (ref 0.0–1.2)
Total Protein: 5.8 g/dL — ABNORMAL LOW (ref 6.5–8.1)

## 2023-12-25 LAB — MAGNESIUM: Magnesium: 1.8 mg/dL (ref 1.7–2.4)

## 2023-12-25 LAB — PHOSPHORUS: Phosphorus: 2.7 mg/dL (ref 2.5–4.6)

## 2023-12-25 MED ORDER — MAGNESIUM SULFATE 2 GM/50ML IV SOLN
2.0000 g | Freq: Once | INTRAVENOUS | Status: AC
  Administered 2023-12-25: 2 g via INTRAVENOUS
  Filled 2023-12-25: qty 50

## 2023-12-25 MED ORDER — RIVAROXABAN 2.5 MG PO TABS
2.5000 mg | ORAL_TABLET | Freq: Two times a day (BID) | ORAL | Status: DC
  Administered 2023-12-25 – 2023-12-26 (×2): 2.5 mg via ORAL
  Filled 2023-12-25 (×2): qty 1

## 2023-12-25 NOTE — Progress Notes (Signed)
 Physical Therapy Treatment  Patient Details Name: Tiffany [name] MRN: [id number] DOB: 1946-01-28 Today's Date: 12/25/2023   History of Present Illness Pt is a 78 y/o female who presents 12/17/2023 s/p fall at home. She was found to have a very complex comminuted proximal femur fracture and is now s/p ORIF on 12/21/2023. PMH significant for anxiety/depression, esophageal spasm, HTN, osteoporosis, plantar fasciitis, PVC's, R RSA.    PT Comments  Pt progressing towards physical therapy goals. Was able to perform transfers and minimal ambulation (~3') with RW and up to +2 mod assist. PT recommendations remain appropriate at this time. Will continue to follow and progress as able per POC.     If plan is discharge home, recommend the following: A little help with walking and/or transfers;A little help with bathing/dressing/bathroom;Assistance with cooking/housework;Assist for transportation;Help with stairs or ramp for entrance   Can travel by private vehicle     No  Equipment Recommendations  None recommended by PT    Recommendations for Other Services       Precautions / Restrictions Precautions Precautions: Fall Recall of Precautions/Restrictions: Impaired Restrictions Weight Bearing Restrictions Per Provider Order: Yes LLE Weight Bearing Per Provider Order: Touchdown weight bearing     Mobility  Bed Mobility Overal bed mobility: Needs Assistance Bed Mobility: Supine to Sit     Supine to sit: +2 for physical assistance, +2 for safety/equipment, HOB elevated, Used rails, Mod assist     General bed mobility comments: VC's for use of railings for support. Pt initiating more scooting this session however continues to require increased assist with trunk elevation and use of bed pad to complete scoot out with feet on the floor at EOB.    Transfers Overall transfer level: Needs assistance Equipment used: Rolling walker (2 wheels) Transfers: Sit to/from Stand, Bed to  chair/wheelchair/BSC Sit to Stand: Mod assist, +2 physical assistance, From elevated surface Stand pivot transfers: +2 physical assistance, +2 safety/equipment, From elevated surface, Mod assist         General transfer comment: Pt transferred to [facility] with increased ability to advance RLE and maintain TTWB on the L.    Ambulation/Gait Ambulation/Gait assistance: Min assist, +2 safety/equipment Gait Distance (Feet): 3 Feet Assistive device: Rolling walker (2 wheels) Gait Pattern/deviations: Step-to pattern, Decreased stride length, Trunk flexed, Narrow base of support Gait velocity: Decreased Gait velocity interpretation: <1.31 ft/sec, indicative of household ambulator   General Gait Details: Pt took 3 short steps forward with fair maintenance of TTWB status. Chair pulled up behind her for seated rest. Did not attempt to progress gait at this time due to difficulty maintaining WB status.   Stairs             Wheelchair Mobility     Tilt Bed    Modified Rankin (Stroke Patients Only)       Balance Overall balance assessment: Needs assistance Sitting-balance support: Bilateral upper extremity supported, Feet supported Sitting balance-Leahy Scale: Poor Sitting balance - Comments: sitting EOB Postural control: Right lateral lean (to offload LLE) Standing balance support: Bilateral upper extremity supported, During functional activity, Reliant on assistive device for balance Standing balance-Leahy Scale: Poor                              Communication Communication Communication: Impaired Factors Affecting Communication: Hearing impaired  Cognition Arousal: Alert Behavior During Therapy: WFL for tasks assessed/performed   PT - Cognitive impairments: Memory, Awareness, Safety/Judgement, Problem solving,  Sequencing                         Following commands: Impaired Following commands impaired: Follows one step commands inconsistently, Follows  one step commands with increased time    Cueing Cueing Techniques: Verbal cues, Gestural cues  Exercises      General Comments        Pertinent Vitals/Pain Pain Assessment Pain Assessment: Faces Faces Pain Scale: Hurts even more Pain Location: Left leg Pain Descriptors / Indicators: Aching, Operative site guarding, Sore Pain Intervention(s): Limited activity within patient's tolerance, Monitored during session, Repositioned    Home Living                          Prior Function            PT Goals (current goals can now be found in the care plan section) Acute Rehab PT Goals Patient Stated Goal: Be able to return to her home PT Goal Formulation: With patient Time For Goal Achievement: 01/05/24 Potential to Achieve Goals: Good Progress towards PT goals: Progressing toward goals    Frequency    Min 2X/week      PT Plan      Co-evaluation PT/OT/SLP Co-Evaluation/Treatment: Yes Reason for Co-Treatment: For patient/therapist safety;To address functional/ADL transfers PT goals addressed during session: Mobility/safety with mobility;Balance;Proper use of DME;Strengthening/ROM        AM-PAC PT 6 Clicks Mobility   Outcome Measure  Help needed turning from your back to your side while in a flat bed without using bedrails?: A Lot Help needed moving from lying on your back to sitting on the side of a flat bed without using bedrails?: A Lot Help needed moving to and from a bed to a chair (including a wheelchair)?: A Lot Help needed standing up from a chair using your arms (e.g., wheelchair or bedside chair)?: A Lot Help needed to walk in hospital room?: A Lot Help needed climbing 3-5 steps with a railing? : Total 6 Click Score: 11    End of Session Equipment Utilized During Treatment: Gait belt Activity Tolerance: Patient tolerated treatment well Patient left: in chair;with call bell/phone within reach;with family/visitor present Nurse Communication:  Mobility status PT Visit Diagnosis: Unsteadiness on feet (R26.81);Pain Pain - Right/Left: Left Pain - part of body: Hip     Time: 8988-8964 PT Time Calculation (min) (ACUTE ONLY): 24 min  Charges:    $Gait Training: 23-37 mins PT General Charges $$ ACUTE PT VISIT: 1 Visit                     Tiffany [name], PT, DPT Acute Rehabilitation Services Secure Chat Preferred Office: [phone]    Tiffany [name] 12/25/2023, 1:20 PM

## 2023-12-25 NOTE — TOC Progression Note (Signed)
 Transition of Care [facility]) - Progression Note    Patient Details  Name: Tiffany [name] MRN: [id number] Date of Birth: Nov 05, 1945  [facility]) CM/SW Contact  Bridget Cordella Simmonds, LCSW Phone Number: 12/25/2023, 3:57 PM  Clinical Narrative:   Bed offers provided to pt on medicare choice document.  Pt would like to accept offer at [facility].  CSW spoke to daughter Earnie, who is also in agreement with [facility].  1550: CSW confirmed with [facility] that they can receive pt tomorrow.  SNF auth request submitted in [employer] and approved: [id number],6 days: 7/15-7/17.    Expected Discharge Plan: Skilled Nursing Facility Barriers to Discharge: Continued Medical Work up, English as a second language teacher, SNF Pending bed offer  Expected Discharge Plan and Services In-house Referral: Clinical Social Work   Post Acute Care Choice: Skilled Nursing Facility Living arrangements for the past 2 months: Single Family Home                                       Social Determinants of Health (SDOH) Interventions SDOH Screenings   Food Insecurity: No Food Insecurity (12/21/2023)  Housing: Low Risk  (12/21/2023)  Transportation Needs: No Transportation Needs (12/21/2023)  Utilities: Not At Risk (12/21/2023)  Alcohol Screen: Low Risk  (10/07/2021)  Depression (PHQ2-9): Low Risk  (11/15/2023)  Financial Resource Strain: Low Risk  (10/07/2021)  Physical Activity: Inactive (10/07/2021)  Social Connections: Moderately Integrated (12/21/2023)  Stress: No Stress Concern Present (10/07/2021)  Tobacco Use: Low Risk  (12/21/2023)    Readmission Risk Interventions     No data to display

## 2023-12-25 NOTE — Progress Notes (Signed)
 Respiratory tubed up the flutter that was requested this a.m.  These are no longer stocked on units per RT.

## 2023-12-25 NOTE — Progress Notes (Signed)
 PROGRESS NOTE    Tiffany [name]  FMW:[id number] DOB: October 07, 1945 DOA: 12/20/2023 PCP: Avelina Greig BRAVO, MD   Brief Narrative:  Tiffany [name] is a 78 y.o. female with hx of hypertension, hyperlipidemia, gastritis/esophagitis, esophageal stenosis, anxiety, who presented after a ground-level fall.  She reports that she was standing at the end of the night and she slipped and fell.  Her only complaint is pain in the left hip denies other injuries. Orthopedic Surgery was consulted and took the patient for ORIF of the Left Complex Proximal Femur Fx and she is POD 4. Blood count did drop so will need to Monitor for Transfusion but now Hgb hovering in the low 7 range. PT/OT recommending SNF.   Hospitalization has been complicated by Hypoxia and patient continues to Desaturate.   Assessment and Plan:  Left intertrochanteric hip fracture s/p IM Rod POD 4 Clinical diagnosis Osteoporosis Ground-level fall -Orthopedic surgery consulted and took the pateint for ORIF on 7/10 -DVT prophylaxis per Ortho and they are recommending ASA 81 mg po BID x4 weeks; Patient has an ASA allergy listed but will need to verify this Allergy if it is a true allergy.  I discussed with orthopedic surgery and they are okay with transitioning to 2.5 mg p.o. twice daily of apixaban  for VTE prophylaxis.  Continue with SCDs. -Ortho recommending up w/ Therapy and recommending TDWB on LLE due to complex Nature of Fx -Tele monitoring periop -PT/ OT eval postop ordered and recommending SNF -Pain mgmt: Acetaminophen  1000 mg po q6h prn for Mild Pain, Methocarbamol   500 mg po q8h as needed spasm, Oxycodone  IR 5-10 mg po q4hprn and 10-15 mg  p.o. every 4 hours prn for moderate/severe, Hydromorphone  0.5 mg IV every 4 hours prn for breakthrough -Check vitamin D  and was 33.27; was started on Vitamin D3 1000 IU daily -Will need follow-up with PCP for further treatment of osteoporosis -She had a Leukocytosis (WBC went from 11.9 -> 12.3 ->  10.2 -> 8.3 -> 10.2 and is now 9.3) and likely reactive.  -Orthopedic Surgery recommending TdWB for 2-4 weeks and then advant to 50% WB b/w 4-6 week standpoint after surgery.  -Bowel regimen initiated and will c/w Miralax  17 grams po Daily prn and Docusate 100 mg po BID. CTM and further care per Orthopedic Sx -PT/OT recommending SNF   ? Short-term Memory Deficit: After discussing initial presentation and plan she was very unclear about these details. However fully oriented. Delirium Precautions   Acute Respiratory Failure with Hypoxia: Was wearing 4 to 5 L of supplemental oxygen.  Chest x-ray done and showed Low lung volumes with vascular crowding and streaky basilar atelectasis. Could not exclude developing pulmonary infiltrates.  Add flutter valve, incentive spirometry, guaifenesin .  As albuterol  2.5 mg nebs every 4 as needed for wheezing or shortness of breath. Wean O2 as tolerated. SpO2: 100 % O2 Flow Rate (L/min): 4 L/min. Will hold off starting Abx for now -CXR this AM done showed No acute findings. Linear atelectasis at both lung bases with slightly improved aeration. -Continues to take her O2 off. If hypoxia persists will consider CTA of the Chest to r/o PE but will get D-Dimer first. Likely will need Supplemental O2 @ D/C.  Being started on 2.5 mg of p.o. BID Apixaban .  Essential Hypertension: Not on antihypertensives at time of admission. CTM BP per Protocol. Last BP reading was a little soft at 101/52  Hyperlipidemia: C/w Home Atorvastatin  10 mg po Daily   History Esophageal Stricture: Noted  and see below  Renal Insuffiencey: BUN/Cr Trend: Recent Labs  Lab 12/20/23 1903 12/21/23 0734 12/21/23 1411 12/22/23 0954 12/23/23 1104 12/24/23 0826 12/25/23 0947  BUN 12 11 11 13 15 8  7*  CREATININE 0.99 0.88 0.80 1.04* 0.93 0.72 0.78  -Avoid Nephrotoxic Medications, Contrast Dyes, Hypotension and Dehydration to Ensure Adequate Renal Perfusion and will need to Renally Adjust  Meds -Continue to Monitor and Trend Renal Function carefully and repeat CMP in the AM   HypoNatremia: Na+ Trend:  Recent Labs  Lab 12/20/23 1903 12/21/23 0734 12/21/23 1411 12/22/23 0954 12/23/23 1104 12/24/23 0826 12/25/23 0947  NA 135 138 137 130* 127* 133* 130*  -CTM and Trend and repeat CMP in the AM  Dysphagia: SLP evaluated and recommending a Dysphagia 3 Diet and recommending an Esophagram which has been ordered and showed Severe esophageal dysmotility visualized causing inability to pass a 13 mm barium tablet. No definite esophageal stricture. Gastroesophageal reflux visualized.. -Will discuss w/ GI in the AM  Mood Disorder/Anxiety/Insomnia: C/w home Paroxetine  40 mg po Daily, Buspirone  5 mg TIDprn Anxiety, Nortriptyline  nightly and Melatonin 6 mg po qHS  ABLA due to Post-operative drop /Normocytic Anemia: Hgb/Hct Trend:  Recent Labs  Lab 12/20/23 2143 12/21/23 0734 12/21/23 1411 12/22/23 0954 12/23/23 1104 12/24/23 0826 12/25/23 0947  HGB 11.0* 10.5* 10.5* 7.8* 7.3* 7.4* 7.7*  HCT 34.6* 32.6* 31.0* 24.7* 22.2* 23.4* 23.9*  MCV 96.9 95.9  --  99.2 94.9 96.7 97.6  -Expected a Post-Operative Drop and now Hgb hovering in the low 7 range. Checked Anemia Panel and showed an iron level of 14, UIBC of 304, TIBC 318, saturation ratios of 4%, ferritin of 67, folate level of 13.5 and vitamin B12 level 336. CTM for S/Sx of Bleeding; No overt bleeding noted. Repeat CBC in the AM  Gastritis/Esophagitis/GERD/GI Prophylaxis: Continue home PPI w/ Pantoprazole  40 mg po BID  Hypoalbuminemia: Patient's Albumin  Lvl Trend went from 3.6 -> 3.7 -> 2.8 -> 2.6 x2 -> 2.5. CTM and Trend and repeat CMP in the AM   DVT prophylaxis: SCDs Start: 12/21/23 1434 SCDs Start: 12/20/23 2221 rivaroxaban  (XARELTO ) tablet 2.5 mg    Code Status: Limited: Do not attempt resuscitation (DNR) -DNR-LIMITED -Do Not Intubate/DNI  Family Communication: No family currently at bedside  Disposition Plan:  Level of  care: Telemetry Medical Status is: Inpatient Remains inpatient appropriate because: Needs further clinical improvement and and anticipating discharge in next 24 to 48 hours   Consultants:  Orthopedic surgery  Procedures:  As delineated as above   Antimicrobials:  Anti-infectives (From admission, onward)    Start     Dose/Rate Route Frequency Ordered Stop   12/21/23 1630  ceFAZolin  (ANCEF ) IVPB 2g/100 mL premix        2 g 200 mL/hr over 30 Minutes Intravenous Every 6 hours 12/21/23 1433 12/21/23 2126   12/21/23 1015  ceFAZolin  (ANCEF ) IVPB 2g/100 mL premix        2 g 200 mL/hr over 30 Minutes Intravenous On call to O.R. 12/21/23 0916 12/21/23 1118       Subjective: Seen and examined at bedside and was doing okay and sitting in the chair.  Denies any nausea or vomiting.  Still complains of some pain.  No other concerns or complaints at this time.  Objective: Vitals:   12/25/23 0353 12/25/23 0729 12/25/23 1444 12/25/23 1949  BP: 121/64 115/67 130/67 (!) 101/52  Pulse: 88 89 93 96  Resp: 19   16  Temp: 99.1 F (37.3  C) 98.6 F (37 C) 98.4 F (36.9 C) 99.9 F (37.7 C)  TempSrc: Oral Oral Oral Oral  SpO2: 99% 100% 92% 100%  Weight:      Height:        Intake/Output Summary (Last 24 hours) at 12/25/2023 2110 Last data filed at 12/25/2023 1743 Gross per 24 hour  Intake 9.24 ml  Output --  Net 9.24 ml   Filed Weights   12/20/23 1443 12/21/23 0928  Weight: 59 kg 58.9 kg   Examination: Physical Exam:  Constitutional: Chronically ill-appearing elderly Caucasian female no acute distress Respiratory: Diminished to auscultation bilaterally with some coarse breath sounds, no wheezing, rales, rhonchi or crackles. Normal respiratory effort and patient is not tachypenic. No accessory muscle use.  Wearing supplemental oxygen via nasal cannula Cardiovascular: RRR, no murmurs / rubs / gallops. S1 and S2 auscultated. No extremity edema.  Abdomen: Soft, non-tender, non-distended.  Bowel sounds positive.  GU: Deferred. Musculoskeletal: No clubbing / cyanosis of digits/nails. No joint deformity upper and lower extremities  Skin: No rashes, lesions, ulcers. No induration; Warm and dry.  Neurologic: CN 2-12 grossly intact with no focal deficits. Romberg sign cerebellar reflexes not assessed.  Psychiatric: She is awake and alert  Data Reviewed: I have personally reviewed following labs and imaging studies  CBC: Recent Labs  Lab 12/20/23 2143 12/21/23 0734 12/21/23 1411 12/22/23 0954 12/23/23 1104 12/24/23 0826 12/25/23 0947  WBC 11.9* 12.3*  --  10.2 8.3 10.2 9.3  NEUTROABS 9.9*  --   --  7.3 6.5 7.9* 6.8  HGB 11.0* 10.5* 10.5* 7.8* 7.3* 7.4* 7.7*  HCT 34.6* 32.6* 31.0* 24.7* 22.2* 23.4* 23.9*  MCV 96.9 95.9  --  99.2 94.9 96.7 97.6  PLT 324 345  --  259 262 310 321   Basic Metabolic Panel: Recent Labs  Lab 12/21/23 0734 12/21/23 1411 12/22/23 0954 12/23/23 1104 12/24/23 0826 12/25/23 0947  NA 138 137 130* 127* 133* 130*  K 3.9 4.0 4.4 4.0 4.1 4.1  CL 102 99 96* 97* 99 94*  CO2 24  --  23 23 24 26   GLUCOSE 118* 135* 102* 132* 131* 107*  BUN 11 11 13 15 8  7*  CREATININE 0.88 0.80 1.04* 0.93 0.72 0.78  CALCIUM  8.8*  --  8.2* 8.0* 8.3* 8.4*  MG 1.9  --  1.8 2.1 2.0 1.8  PHOS 3.1  --  2.7 2.7 2.8 2.7   GFR: Estimated Creatinine Clearance: 48.2 mL/min (by C-G formula based on SCr of 0.78 mg/dL). Liver Function Tests: Recent Labs  Lab 12/21/23 0900 12/22/23 0954 12/23/23 1104 12/24/23 0826 12/25/23 0947  AST 27 41 45* 34 27  ALT 16 15 13 12 12   ALKPHOS 90 64 65 81 75  BILITOT 0.6 0.5 0.5 0.8 0.7  PROT 6.8 5.7* 5.9* 5.7* 5.8*  ALBUMIN  3.7 2.8* 2.6* 2.6* 2.5*   No results for input(s): LIPASE, AMYLASE in the last 168 hours. No results for input(s): AMMONIA in the last 168 hours. Coagulation Profile: Recent Labs  Lab 12/21/23 0734  INR 1.0   Cardiac Enzymes: Recent Labs  Lab 12/20/23 2143  CKTOTAL 338*   BNP (last 3  results) Recent Labs    09/12/23 0859  PROBNP 63.0   HbA1C: No results for input(s): HGBA1C in the last 72 hours. CBG: No results for input(s): GLUCAP in the last 168 hours. Lipid Profile: No results for input(s): CHOL, HDL, LDLCALC, TRIG, CHOLHDL, LDLDIRECT in the last 72 hours. Thyroid  Function Tests: No  results for input(s): TSH, T4TOTAL, FREET4, T3FREE, THYROIDAB in the last 72 hours. Anemia Panel: Recent Labs    12/23/23 1104  VITAMINB12 336  FOLATE 13.5  FERRITIN 67  TIBC 318  IRON 14*  RETICCTPCT 1.9   Sepsis Labs: No results for input(s): PROCALCITON, LATICACIDVEN in the last 168 hours.  Recent Results (from the past 240 hours)  Surgical pcr screen     Status: None   Collection Time: 12/21/23  9:34 AM   Specimen: Nasal Mucosa; Nasal Swab  Result Value Ref Range Status   MRSA, PCR NEGATIVE NEGATIVE Final   Staphylococcus aureus NEGATIVE NEGATIVE Final    Comment: (NOTE) The Xpert SA Assay (FDA approved for NASAL specimens in patients 88 years of age and older), is one component of a comprehensive surveillance program. It is not intended to diagnose infection nor to guide or monitor treatment. Performed at [facility], [address], [place], [place] [address]     Radiology Studies: DG ESOPHAGUS W SINGLE CM (SOL OR THIN BA) Result Date: 12/25/2023 CLINICAL DATA:  77 year old female with dysphagia for diagnostic esophagram. EXAM: ESOPHAGUS/BARIUM SWALLOW/TABLET STUDY TECHNIQUE: Single contrast examination was performed using thin liquid barium. This exam was performed by Abigail C. Emerson, PA-C, and was supervised and interpreted by Dr. Juliene Balder. FLUOROSCOPY: Radiation Exposure Index (as provided by the fluoroscopic device): 28.5 mGy Kerma COMPARISON:  None Available. FINDINGS: Swallowing: Appears normal. No vestibular penetration or aspiration seen. Pharynx: Unremarkable. Esophagus: Normal appearance, no mucosal lesions or  strictures visualized. Esophageal motility: Severe esophageal dysmotility visualized on exam. Tertiary contractions visualized. Retropulsion of barium visualized. Hiatal Hernia: None visualized. Gastroesophageal reflux: Gastroesophageal reflux visualized with provocative maneuvers. Ingested 13mm barium tablet: 13 mm barium tablet became stuck at the lower third of the esophagus, subsequent barium and water  were offered to the patient with little to no movement of the barium tablet. Patient informed tablet will dissolve. Other: Exam limited due to patient inability to stand or turn. IMPRESSION: Severe esophageal dysmotility visualized causing inability to pass a 13 mm barium tablet. No definite esophageal stricture. Gastroesophageal reflux visualized. Performed By Lavanda Jurist, PA-C Electronically Signed   By: Juliene Balder M.D.   On: 12/25/2023 15:27   DG CHEST PORT 1 VIEW Result Date: 12/24/2023 EXAM: 1 VIEW XRAY OF THE CHEST 12/24/2023 06:43:12 AM COMPARISON: 1 view chest x-ray 12/23/2023 and 1 view chest x-ray 12/20/2023. CLINICAL HISTORY: 141880 SOB (shortness of breath). FINDINGS: LUNGS AND PLEURA: Areas of linear atelectasis are again noted at both lung bases. Aeration is slightly improved. Lung volumes remain low. No focal pulmonary opacity. No pulmonary edema. No pleural effusion. No pneumothorax. HEART AND MEDIASTINUM: No acute abnormality of the cardiac and mediastinal silhouettes. BONES AND SOFT TISSUES: No acute osseous abnormality. IMPRESSION: 1. No acute findings. 2. Linear atelectasis at both lung bases with slightly improved aeration. Electronically signed by: Lonni Necessary MD 12/24/2023 08:40 AM EDT RP Workstation: [id number]   Scheduled Meds:  atorvastatin   10 mg Oral Daily   cholecalciferol   1,000 Units Oral Daily   docusate sodium   100 mg Oral BID    HYDROmorphone  (DILAUDID ) injection  0.5 mg Intravenous Once   nortriptyline   150 mg Oral QHS   pantoprazole   40 mg Oral BID AC    PARoxetine   40 mg Oral Daily   rivaroxaban   2.5 mg Oral BID   sodium chloride  flush  3 mL Intravenous Q12H   Continuous Infusions:   LOS: 5 days   Alejandro Marker,  DO [facility] Available via Epic secure chat 7am-7pm After these hours, please refer to coverage provider listed on [email or web address] 12/25/2023, 9:10 PM

## 2023-12-25 NOTE — Plan of Care (Signed)
   Problem: Education: Goal: Knowledge of General Education information will improve Description Including pain rating scale, medication(s)/side effects and non-pharmacologic comfort measures Outcome: Progressing   Problem: Health Behavior/Discharge Planning: Goal: Ability to manage health-related needs will improve Outcome: Progressing

## 2023-12-26 ENCOUNTER — Telehealth (HOSPITAL_COMMUNITY): Payer: Self-pay | Admitting: Pharmacy Technician

## 2023-12-26 ENCOUNTER — Inpatient Hospital Stay (HOSPITAL_COMMUNITY)

## 2023-12-26 ENCOUNTER — Other Ambulatory Visit (HOSPITAL_COMMUNITY): Payer: Self-pay

## 2023-12-26 DIAGNOSIS — M6281 Muscle weakness (generalized): Secondary | ICD-10-CM | POA: Diagnosis not present

## 2023-12-26 DIAGNOSIS — R0989 Other specified symptoms and signs involving the circulatory and respiratory systems: Secondary | ICD-10-CM | POA: Diagnosis not present

## 2023-12-26 DIAGNOSIS — M5030 Other cervical disc degeneration, unspecified cervical region: Secondary | ICD-10-CM | POA: Diagnosis not present

## 2023-12-26 DIAGNOSIS — M810 Age-related osteoporosis without current pathological fracture: Secondary | ICD-10-CM | POA: Diagnosis not present

## 2023-12-26 DIAGNOSIS — J9811 Atelectasis: Secondary | ICD-10-CM | POA: Diagnosis not present

## 2023-12-26 DIAGNOSIS — R2689 Other abnormalities of gait and mobility: Secondary | ICD-10-CM | POA: Diagnosis not present

## 2023-12-26 DIAGNOSIS — S72142D Displaced intertrochanteric fracture of left femur, subsequent encounter for closed fracture with routine healing: Secondary | ICD-10-CM | POA: Diagnosis not present

## 2023-12-26 DIAGNOSIS — J9601 Acute respiratory failure with hypoxia: Secondary | ICD-10-CM | POA: Diagnosis not present

## 2023-12-26 DIAGNOSIS — S72142A Displaced intertrochanteric fracture of left femur, initial encounter for closed fracture: Secondary | ICD-10-CM | POA: Diagnosis not present

## 2023-12-26 DIAGNOSIS — Z96611 Presence of right artificial shoulder joint: Secondary | ICD-10-CM | POA: Diagnosis not present

## 2023-12-26 DIAGNOSIS — R0602 Shortness of breath: Secondary | ICD-10-CM | POA: Diagnosis not present

## 2023-12-26 DIAGNOSIS — I700 Atherosclerosis of aorta: Secondary | ICD-10-CM | POA: Diagnosis not present

## 2023-12-26 DIAGNOSIS — M25552 Pain in left hip: Secondary | ICD-10-CM | POA: Diagnosis not present

## 2023-12-26 LAB — CBC WITH DIFFERENTIAL/PLATELET
Abs Immature Granulocytes: 0.1 K/uL — ABNORMAL HIGH (ref 0.00–0.07)
Basophils Absolute: 0 K/uL (ref 0.0–0.1)
Basophils Relative: 0 %
Eosinophils Absolute: 0.4 K/uL (ref 0.0–0.5)
Eosinophils Relative: 4 %
HCT: 24.1 % — ABNORMAL LOW (ref 36.0–46.0)
Hemoglobin: 7.7 g/dL — ABNORMAL LOW (ref 12.0–15.0)
Immature Granulocytes: 1 %
Lymphocytes Relative: 14 %
Lymphs Abs: 1.3 K/uL (ref 0.7–4.0)
MCH: 31 pg (ref 26.0–34.0)
MCHC: 32 g/dL (ref 30.0–36.0)
MCV: 97.2 fL (ref 80.0–100.0)
Monocytes Absolute: 1.1 K/uL — ABNORMAL HIGH (ref 0.1–1.0)
Monocytes Relative: 11 %
Neutro Abs: 6.6 K/uL (ref 1.7–7.7)
Neutrophils Relative %: 70 %
Platelets: 359 K/uL (ref 150–400)
RBC: 2.48 MIL/uL — ABNORMAL LOW (ref 3.87–5.11)
RDW: 13.8 % (ref 11.5–15.5)
WBC: 9.5 K/uL (ref 4.0–10.5)
nRBC: 0 % (ref 0.0–0.2)

## 2023-12-26 LAB — COMPREHENSIVE METABOLIC PANEL WITH GFR
ALT: 13 U/L (ref 0–44)
AST: 25 U/L (ref 15–41)
Albumin: 2.5 g/dL — ABNORMAL LOW (ref 3.5–5.0)
Alkaline Phosphatase: 72 U/L (ref 38–126)
Anion gap: 11 (ref 5–15)
BUN: 7 mg/dL — ABNORMAL LOW (ref 8–23)
CO2: 27 mmol/L (ref 22–32)
Calcium: 8.7 mg/dL — ABNORMAL LOW (ref 8.9–10.3)
Chloride: 95 mmol/L — ABNORMAL LOW (ref 98–111)
Creatinine, Ser: 0.72 mg/dL (ref 0.44–1.00)
GFR, Estimated: >60 mL/min (ref >60)
Glucose, Bld: 103 mg/dL — ABNORMAL HIGH (ref 70–99)
Potassium: 3.9 mmol/L (ref 3.5–5.1)
Sodium: 133 mmol/L — ABNORMAL LOW (ref 135–145)
Total Bilirubin: 0.8 mg/dL (ref 0.0–1.2)
Total Protein: 5.8 g/dL — ABNORMAL LOW (ref 6.5–8.1)

## 2023-12-26 LAB — MAGNESIUM: Magnesium: 2.1 mg/dL (ref 1.7–2.4)

## 2023-12-26 LAB — PHOSPHORUS: Phosphorus: 3.3 mg/dL (ref 2.5–4.6)

## 2023-12-26 LAB — D-DIMER, QUANTITATIVE: D-Dimer, Quant: 3.1 ug{FEU}/mL — ABNORMAL HIGH (ref 0.00–0.50)

## 2023-12-26 MED ORDER — ACETAMINOPHEN 500 MG PO TABS: 1000.0000 mg | ORAL_TABLET | Freq: Four times a day (QID) | ORAL | Status: AC | PRN

## 2023-12-26 MED ORDER — MENTHOL 3 MG MT LOZG
1.0000 | LOZENGE | OROMUCOSAL | 12 refills | Status: DC | PRN
  Filled 2023-12-26: qty 100, fill #0

## 2023-12-26 MED ORDER — APIXABAN 2.5 MG PO TABS: 2.5000 mg | ORAL_TABLET | Freq: Two times a day (BID) | ORAL | Status: DC

## 2023-12-26 MED ORDER — OXYCODONE HCL 5 MG PO TABS: 5.0000 mg | ORAL_TABLET | Freq: Four times a day (QID) | ORAL | 0 refills | Status: AC | PRN

## 2023-12-26 MED ORDER — ALBUTEROL SULFATE (2.5 MG/3ML) 0.083% IN NEBU
2.5000 mg | INHALATION_SOLUTION | RESPIRATORY_TRACT | 12 refills | Status: DC | PRN
Start: 2023-12-26 — End: 2024-01-16
  Filled 2023-12-26: qty 90, 5d supply, fill #0
  Filled 2023-12-29: qty 75, 5d supply, fill #0

## 2023-12-26 MED ORDER — APIXABAN 2.5 MG PO TABS
2.5000 mg | ORAL_TABLET | Freq: Two times a day (BID) | ORAL | 0 refills | Status: DC
  Filled 2023-12-26: qty 60, 30d supply, fill #0

## 2023-12-26 MED ORDER — POLYETHYLENE GLYCOL 3350 17 GM/SCOOP PO POWD
17.0000 g | Freq: Every day | ORAL | 0 refills | Status: AC | PRN
  Filled 2023-12-26 – 2023-12-29 (×2): qty 238, 14d supply, fill #0

## 2023-12-26 MED ORDER — DOCUSATE SODIUM 100 MG PO CAPS
100.0000 mg | ORAL_CAPSULE | Freq: Two times a day (BID) | ORAL | 0 refills | Status: DC
  Filled 2023-12-26 – 2023-12-29 (×2): qty 10, 5d supply, fill #0

## 2023-12-26 NOTE — Plan of Care (Signed)
  Problem: Education: Goal: Knowledge of General Education information will improve Description: Including pain rating scale, medication(s)/side effects and non-pharmacologic comfort measures Outcome: Adequate for Discharge   Problem: Health Behavior/Discharge Planning: Goal: Ability to manage health-related needs will improve Outcome: Adequate for Discharge   Problem: Clinical Measurements: Goal: Ability to maintain clinical measurements within normal limits will improve Outcome: Adequate for Discharge Goal: Will remain free from infection Outcome: Adequate for Discharge Goal: Diagnostic test results will improve Outcome: Adequate for Discharge Goal: Respiratory complications will improve Outcome: Adequate for Discharge Goal: Cardiovascular complication will be avoided Outcome: Adequate for Discharge   Problem: Activity: Goal: Risk for activity intolerance will decrease Outcome: Adequate for Discharge   Problem: Nutrition: Goal: Adequate nutrition will be maintained Outcome: Adequate for Discharge   Problem: Coping: Goal: Level of anxiety will decrease Outcome: Adequate for Discharge   Problem: Elimination: Goal: Will not experience complications related to bowel motility Outcome: Adequate for Discharge Goal: Will not experience complications related to urinary retention Outcome: Adequate for Discharge   Problem: Pain Managment: Goal: General experience of comfort will improve and/or be controlled Outcome: Adequate for Discharge   Problem: Safety: Goal: Ability to remain free from injury will improve Outcome: Adequate for Discharge   Problem: Skin Integrity: Goal: Risk for impaired skin integrity will decrease Outcome: Adequate for Discharge   Problem: Education: Goal: Verbalization of understanding the information provided (i.e., activity precautions, restrictions, etc) will improve Outcome: Adequate for Discharge Goal: Individualized Educational  Video(s) Outcome: Adequate for Discharge   Problem: Activity: Goal: Ability to ambulate and perform ADLs will improve Outcome: Adequate for Discharge   Problem: Clinical Measurements: Goal: Postoperative complications will be avoided or minimized Outcome: Adequate for Discharge   Problem: Self-Concept: Goal: Ability to maintain and perform role responsibilities to the fullest extent possible will improve Outcome: Adequate for Discharge   Problem: Pain Management: Goal: Pain level will decrease Outcome: Adequate for Discharge

## 2023-12-26 NOTE — TOC Transition Note (Signed)
 Transition of Care [facility]) - Discharge Note   Patient Details  Name: Tiffany [name] MRN: [id number] Date of Birth: 08-Dec-1945  [facility] CM/SW Contact:  Bridget Cordella Simmonds, LCSW Phone Number: 12/26/2023, 3:49 PM   Clinical Narrative:   Pt discharging to [facility], room 602.  RN call report to [phone].  PTAR called [phone].    Final next level of care: Skilled Nursing Facility Barriers to Discharge: Barriers Resolved   Patient Goals and CMS Choice Patient states their goals for this hospitalization and ongoing recovery are:: Rehab CMS Medicare.gov Compare Post Acute Care list provided to:: Patient Choice offered to / list presented to : Patient [place] ownership interest in [facility].provided to:: Patient    Discharge Placement              Patient [facility] bed at: [facility] Patient to be transferred to facility by: ptar Name of family member notified: left message with daughter Earnie Patient and family notified of of transfer: 12/26/23  Discharge Plan and Services Additional resources added to the After Visit Summary for   In-house Referral: Clinical Social Work   Post Acute Care Choice: Skilled Nursing Facility                               Social Drivers of Health (SDOH) Interventions SDOH Screenings   Food Insecurity: No Food Insecurity (12/21/2023)  Housing: Low Risk  (12/21/2023)  Transportation Needs: No Transportation Needs (12/21/2023)  Utilities: Not At Risk (12/21/2023)  Alcohol Screen: Low Risk  (10/07/2021)  Depression (PHQ2-9): Low Risk  (11/15/2023)  Financial Resource Strain: Low Risk  (10/07/2021)  Physical Activity: Inactive (10/07/2021)  Social Connections: Moderately Integrated (12/21/2023)  Stress: No Stress Concern Present (10/07/2021)  Tobacco Use: Low Risk  (12/21/2023)     Readmission Risk Interventions     No data to display

## 2023-12-26 NOTE — Progress Notes (Signed)
 Patient discharging to [facility] via ptar, discharge packet and prescription are at bedside. Report given to Nurse Marval at [facility]. Iv access removed.

## 2023-12-26 NOTE — Telephone Encounter (Addendum)
 Patient Product/process development scientist completed.    The patient is insured through [facility]. Patient has Medicare and is not eligible for a copay card, but may be able to apply for patient assistance or Medicare RX Payment Plan (Patient Must reach out to their plan, if eligible for payment plan), if available.    Ran test claim for Xarelto  2.5 mg and the current 30 day co-pay is $47.00.  Ran test claim for Eliquis  2.5 mg and the current 30 day co-pay is $47.00.  This test claim was processed through [facility]- copay amounts may vary at other pharmacies due to pharmacy/plan contracts, or as the patient moves through the different stages of their insurance plan.     Reyes Sharps, CPHT Pharmacy Technician III Certified Patient Advocate [facility] Patient Advocate Team Direct Number: [phone]  Fax: [phone]

## 2023-12-26 NOTE — Discharge Instructions (Signed)
Information on my medicine - ELIQUIS® (apixaban) ° °This medication education was reviewed with me or my healthcare representative as part of my discharge preparation.  ° °Why was Eliquis® prescribed for you? °Eliquis® was prescribed for you to reduce the risk of blood clots forming after orthopedic surgery.   ° °What do You need to know about Eliquis®? °Take your Eliquis® TWICE DAILY - one tablet in the morning and one tablet in the evening with or without food.  It would be best to take the dose about the same time each day. ° °If you have difficulty swallowing the tablet whole please discuss with your pharmacist how to take the medication safely. ° °Take Eliquis® exactly as prescribed by your doctor and DO NOT stop taking Eliquis® without talking to the doctor who prescribed the medication.  Stopping without other medication to take the place of Eliquis® may increase your risk of developing a clot. ° °After discharge, you should have regular check-up appointments with your healthcare provider that is prescribing your Eliquis®. ° °What do you do if you miss a dose? °If a dose of ELIQUIS® is not taken at the scheduled time, take it as soon as possible on the same day and twice-daily administration should be resumed.  The dose should not be doubled to make up for a missed dose.  Do not take more than one tablet of ELIQUIS at the same time. ° °Important Safety Information °A possible side effect of Eliquis® is bleeding. You should call your healthcare provider right away if you experience any of the following: °? Bleeding from an injury or your nose that does not stop. °? Unusual colored urine (red or dark brown) or unusual colored stools (red or black). °? Unusual bruising for unknown reasons. °? A serious fall or if you hit your head (even if there is no bleeding). ° °Some medicines may interact with Eliquis® and might increase your risk of bleeding or clotting while on Eliquis®. To help avoid this, consult your  healthcare provider or pharmacist prior to using any new prescription or non-prescription medications, including herbals, vitamins, non-steroidal anti-inflammatory drugs (NSAIDs) and supplements. ° °This website has more information on Eliquis® (apixaban): http://www.eliquis.com/eliquis/home ° °

## 2023-12-26 NOTE — Discharge Summary (Signed)
 Physician Discharge Summary   Patient: Tiffany [name] MRN: [id number] DOB: [birth year]  Admit date:     12/20/2023  Discharge date: 12/26/23  Discharge Physician: Alejandro Marker, DO   PCP: Avelina Greig BRAVO, MD   Recommendations at discharge:   Follow-up with PCP within 1 to 2 weeks repeat CBC, CMP, mag, Phos within 1 week Follow-up with gastroenterology in outpatient setting and continue with precautions for severe esophageal dysmotility Follow-up with orthopedic surgery within 1 to 2 weeks and continue with weightbearing status per them Follow-up with pulmonary outpatient setting and consider CTA of the chest to evaluate for PE if necessary  Discharge Diagnoses: Principal Problem:   Closed intertrochanteric fracture of left hip, initial encounter [facility])  Resolved Problems:   * No resolved hospital problems. *  Hospital Course: Tiffany [name] is a 78 y.o. female with hx of hypertension, hyperlipidemia, gastritis/esophagitis, esophageal stenosis, anxiety, who presented after a ground-level fall.  She reports that she was standing at the end of the night and she slipped and fell.  Her only complaint is pain in the left hip denies other injuries. Orthopedic Surgery was consulted and took the patient for ORIF of the Left Complex Proximal Femur Fx and she is POD 6. Blood count did drop so will need to Monitor for Transfusion but now Hgb hovering in the low 7 range. PT/OT recommending SNF.   Hospitalization had been complicated by Hypoxia and patient continues to Desaturate without oxygen but is stable at 4 Liters now.  Per discussion with family she is supposed to be on oxygen in the outpatient setting but has been noncompliant and refused.  She is improved and hemoglobin is stable and she is medically stable for discharge at this time will need follow-up with PCP and orthopedic surgery in outpatient setting and continue rehabilitative efforts at skilled nursing facility.  Assessment and  Plan:  Left intertrochanteric hip fracture s/p IM Rod POD 6 Clinical diagnosis Osteoporosis Ground-level fall -Orthopedic surgery consulted and took the pateint for ORIF on 7/10 -DVT prophylaxis per Ortho and they were recommending ASA 81 mg po BID x4 weeks; Patient has an ASA allergy listed but will need to verify this Allergy if it is a true allergy.  I discussed with orthopedic surgery and they are okay with transitioning to 2.5 mg p.o. twice daily of apixaban  for VTE prophylaxis.  Continue with SCDs. -Ortho recommending up w/ Therapy and recommending TDWB on LLE due to complex Nature of Fx -Tele monitoring periop -PT/ OT eval postop ordered and recommending SNF -Pain mgmt: Acetaminophen  1000 mg po q6h prn for Mild Pain, Methocarbamol   500 mg po q8h as needed spasm, Oxycodone  IR 5-10 mg po q4hprn and 10-15 mg  p.o. every 4 hours prn for moderate/severe, Hydromorphone  0.5 mg IV every 4 hours prn for breakthrough -Check vitamin D  and was 33.27; was started on Vitamin D3 1000 IU daily -Will need follow-up with PCP for further treatment of osteoporosis -She had a Leukocytosis (WBC went from 11.9 -> 12.3 -> 10.2 -> 8.3 -> 10.2 -> 9.3 -> 9.5) and likely reactive.  -Orthopedic Surgery recommending TdWB for 2-4 weeks and then advant to 50% WB b/w 4-6 week standpoint after surgery.  -Bowel regimen initiated and will c/w Miralax  17 grams po Daily prn and Docusate 100 mg po BID. CTM and further care per Orthopedic Sx -PT/OT recommending SNF and medically stable for D/C   ? Short-term Memory Deficit: After discussing initial presentation and plan she was very  unclear about these details. However fully oriented. Delirium Precautions   Acute Respiratory Failure with Hypoxia: Was wearing 4 to 5 L of supplemental oxygen.  Initial Chest x-ray done and showed Low lung volumes with vascular crowding and streaky basilar atelectasis. Could not exclude developing pulmonary infiltrates.  Add flutter valve, incentive  spirometry, guaifenesin .  As albuterol  2.5 mg nebs every 4 as needed for wheezing or shortness of breath. Wean O2 as tolerated. SpO2: 98 % O2 Flow Rate (L/min): 4 L/min. Will hold off starting Abx for now -CXR this AM done showed Low lung volumes with bronchovascular crowding and bibasilar atelectasis. -Per family she was recommended to have oxygen in the outpatient setting but the patient had declined.  Will be discharging her with oxygen at this time now. Continues to take her O2 off. If hypoxia persists will consider CTA of the Chest to r/o PE as D-Dimer was 3.10. Will hold off inpatient CTA and can be deferred to the outpatient setting given stability. Wells Criteria done and 1.5 so unlikely PE as she does not have clinical signs of DVT. Likely will need Supplemental O2 @ D/C.  Being started on 2.5 mg of p.o. BID Apixaban  and will continue for now and would be hesitant to place on FULL dose AC given continued and frequent falls; . Will need outpt Pulmonary F/U for further evalaution;   Essential Hypertension: Not on antihypertensives at time of admission. CTM BP per Protocol. Last BP reading was a little soft at 105/52  Hyperlipidemia: C/w Home Atorvastatin  10 mg po Daily   History Esophageal Stricture: Noted and see below  Renal Insuffiencey: BUN/Cr Trend: Recent Labs  Lab 12/21/23 0734 12/21/23 1411 12/22/23 0954 12/23/23 1104 12/24/23 0826 12/25/23 0947 12/26/23 0620  BUN 11 11 13 15 8  7* 7*  CREATININE 0.88 0.80 1.04* 0.93 0.72 0.78 0.72  -Avoid Nephrotoxic Medications, Contrast Dyes, Hypotension and Dehydration to Ensure Adequate Renal Perfusion and will need to Renally Adjust Meds -Continue to Monitor and Trend Renal Function carefully and repeat CMP in the AM   HypoNatremia: Na+ Trend:  Recent Labs  Lab 12/21/23 0734 12/21/23 1411 12/22/23 0954 12/23/23 1104 12/24/23 0826 12/25/23 0947 12/26/23 0620  NA 138 137 130* 127* 133* 130* 133*  -CTM and Trend and repeat CMP  in the AM  Dysphagia: SLP evaluated and recommending a Dysphagia 3 Diet and recommending an Esophagram which has been ordered and showed Severe esophageal dysmotility visualized causing inability to pass a 13 mm barium tablet. No definite esophageal stricture. Gastroesophageal reflux visualized.. -D/w GI and they feel she does have evidence of severe dysmotility on the barium swallow, and no definite stricture though the tablet hung up. Since she just had EGD with dilation of distal esophageal stricture in May 2025 the GI team feels she could be kept on a dysphagia 3 diet, and needs to be upright for all meals and upright for at least an hour after meals n.p.o. for 4 hours prior to bedtime, sips of fluids between bites chew food carefully -Currently not recommending need repeat EGD/dilation inpatient and recommending outpatient F/U with GI  Mood Disorder/Anxiety/Insomnia: C/w home Paroxetine  40 mg po Daily, Buspirone  5 mg TIDprn Anxiety, Nortriptyline  nightly and Melatonin 6 mg po qHS  ABLA due to Post-operative drop /Normocytic Anemia: Hgb/Hct Trend:  Recent Labs  Lab 12/21/23 0734 12/21/23 1411 12/22/23 0954 12/23/23 1104 12/24/23 0826 12/25/23 0947 12/26/23 0620  HGB 10.5* 10.5* 7.8* 7.3* 7.4* 7.7* 7.7*  HCT 32.6* 31.0* 24.7* 22.2*  23.4* 23.9* 24.1*  MCV 95.9  --  99.2 94.9 96.7 97.6 97.2  -Expected a Post-Operative Drop and now Hgb hovering in the low 7 range. Checked Anemia Panel and showed an iron level of 14, UIBC of 304, TIBC 318, saturation ratios of 4%, ferritin of 67, folate level of 13.5 and vitamin B12 level 336. CTM for S/Sx of Bleeding; No overt bleeding noted. Repeat CBC in the AM  Gastritis/Esophagitis/GERD/GI Prophylaxis: Continue home PPI w/ Pantoprazole  40 mg po BID  Hypoalbuminemia: Patient's Albumin  Lvl Trend went from 3.6 -> 3.7 -> 2.8 -> 2.6 x2 -> 2.5 x2. CTM and Trend and repeat CMP in the AM  Consultants: Orthopedic Surgery  Procedures performed: As delineated  as above   Disposition: Skilled nursing facility  Diet recommendation:  Cardiac diet - DYSPHAGIA 3  DISCHARGE MEDICATION: Allergies as of 12/26/2023       Reactions   Aspirin Swelling   Latex Anaphylaxis, Hives   Allegra [fexofenadine] Nausea And Vomiting   Nsaids Swelling   Tramadol Itching   Zolpidem Tartrate Other (See Comments)    unknown        Medication List     STOP taking these medications    HYDROcodone -acetaminophen  10-325 MG tablet Commonly known as: NORCO       TAKE these medications    acetaminophen  500 MG tablet Commonly known as: TYLENOL  Take 2 tablets (1,000 mg total) by mouth every 6 (six) hours as needed for mild pain (pain score 1-3).   albuterol  (2.5 MG/3ML) 0.083% nebulizer solution Commonly known as: PROVENTIL  Take 3 mLs (2.5 mg total) by nebulization every 4 (four) hours as needed for wheezing or shortness of breath.   amLODipine  2.5 MG tablet Commonly known as: NORVASC  Take 1 tablet (2.5 mg total) by mouth daily. FOR RAYNAUDS   apixaban  2.5 MG Tabs tablet Commonly known as: ELIQUIS  Take 1 tablet (2.5 mg total) by mouth 2 (two) times daily.   atorvastatin  10 MG tablet Commonly known as: LIPITOR Take 1 tablet (10 mg total) by mouth daily.   busPIRone  5 MG tablet Commonly known as: BUSPAR  Take 1 tablet (5 mg total) by mouth 3 (three) times daily as needed. What changed: reasons to take this   cyanocobalamin  1000 MCG tablet Commonly known as: VITAMIN B12 Take 1,000 mcg by mouth daily.   docusate sodium  100 MG capsule Commonly known as: COLACE Take 1 capsule (100 mg total) by mouth 2 (two) times daily.   Lidocaine  Pain Relief 4 % Generic drug: lidocaine  Place 1 patch onto the skin daily.   menthol -cetylpyridinium 3 MG lozenge Commonly known as: CEPACOL Take 1 lozenge (3 mg total) by mouth as needed for sore throat.   methocarbamol  500 MG tablet Commonly known as: ROBAXIN  Take 1 tablet (500 mg total) by mouth 4 (four)  times daily as needed.   nortriptyline  50 MG capsule Commonly known as: PAMELOR  Take 3 capsules (150 mg total) by mouth at bedtime.   oxyCODONE  5 MG immediate release tablet Commonly known as: Oxy IR/ROXICODONE  Take 1-2 tablets (5-10 mg total) by mouth every 6 (six) hours as needed for up to 3 days for moderate pain (pain score 4-6) or severe pain (pain score 7-10).   pantoprazole  40 MG tablet Commonly known as: PROTONIX  Take 1 tablet (40 mg) twice a day before a meal for 6 weeks, then 1 tablet (40 mg) once daily indefinitely   PARoxetine  40 MG tablet Commonly known as: Paxil  Take 1/2-1 tablet (20-40 mg total) by  mouth daily as directed   PARoxetine  40 MG tablet Commonly known as: Paxil  Take 1/2-1 tablet (20-40 mg total) by mouth daily as directed.   polyethylene glycol 17 g packet Commonly known as: MIRALAX  / GLYCOLAX  Take 17 g by mouth daily as needed for mild constipation.   sennosides-docusate sodium  8.6-50 MG tablet Commonly known as: SENOKOT-S Take 2 tablets by mouth daily.   VITAMIN D3 PO Take 1 tablet by mouth daily.               Discharge Care Instructions  (From admission, onward)           Start     Ordered   12/26/23 0000  Discharge wound care:       Comments: Per Ortho   12/26/23 1411            Contact information for follow-up providers     Vernetta Lonni GRADE, MD Follow up in 2 week(s).   Specialty: Orthopedic Surgery Contact information: [address] [place]  [place] [address] [phone]         Avelina Greig BRAVO, MD Follow up.   Specialty: Family Medicine Contact information: [address] [place] [address] [phone]              Contact information for after-discharge care     Destination     [facility] .   Service: Skilled Nursing Contact information: [address] [place]  [address] [phone]                    Discharge  Exam: Filed Weights   12/20/23 1443 12/21/23 0928  Weight: 59 kg 58.9 kg   Vitals:   12/26/23 0652 12/26/23 0802  BP: (!) 110/59 (!) 105/52  Pulse: 82 84  Resp:  14  Temp: 98.5 F (36.9 C) 98.5 F (36.9 C)  SpO2: 94% 98%   Examination: Physical Exam:  Constitutional: WN/WD, elderly Caucasian female in NAD Respiratory: Diminished to auscultation bilaterally, no wheezing, rales, rhonchi or crackles. Normal respiratory effort and patient is not tachypenic. No accessory muscle use. Wearing supplemental O2 via [place] Cardiovascular: RRR, no murmurs / rubs / gallops. S1 and S2 auscultated. No extremity edema. 2+ pedal pulses. No carotid bruits.  Abdomen: Soft, non-tender, non-distended. No masses palpated. No appreciable hepatosplenomegaly. Bowel sounds positive.  GU: Deferred. Musculoskeletal: No clubbing / cyanosis of digits/nails. No joint deformity upper and lower extremities. Left Leg is wrapped Skin: No rashes, lesions, ulcers. No induration; Warm and dry.  Neurologic: CN 2-12 grossly intact with no focal deficits. Romberg sign and cerebellar reflexes not assessed.  Psychiatric: Normal judgment and insight. Alert and oriented x 3. Normal mood and appropriate affect.   Condition at discharge: stable  The results of significant diagnostics from this hospitalization (including imaging, microbiology, ancillary and laboratory) are listed below for reference.   Imaging Studies: DG CHEST PORT 1 VIEW Result Date: 12/26/2023 CLINICAL DATA:  Shortness of breath EXAM: PORTABLE CHEST 1 VIEW COMPARISON:  Chest radiograph dated 12/24/2023 FINDINGS: Patient is rotated to the right. Low lung volumes with bronchovascular crowding. Bibasilar linear opacities. No pleural effusion or pneumothorax. The heart size and mediastinal contours are within normal limits. Partially imaged right shoulder arthroplasty. Partially imaged enteric contrast material within the left upper quadrant and right hemiabdomen.  IMPRESSION: Low lung volumes with bronchovascular crowding and bibasilar atelectasis. Electronically Signed   By: Limin  Xu M.D.   On: 12/26/2023 09:30  DG ESOPHAGUS W SINGLE CM (SOL OR THIN BA) Result Date: 12/25/2023 CLINICAL DATA:  78 year old female with dysphagia for diagnostic esophagram. EXAM: ESOPHAGUS/BARIUM SWALLOW/TABLET STUDY TECHNIQUE: Single contrast examination was performed using thin liquid barium. This exam was performed by Abigail C. Emerson, PA-C, and was supervised and interpreted by Dr. Juliene Balder. FLUOROSCOPY: Radiation Exposure Index (as provided by the fluoroscopic device): 28.5 mGy Kerma COMPARISON:  None Available. FINDINGS: Swallowing: Appears normal. No vestibular penetration or aspiration seen. Pharynx: Unremarkable. Esophagus: Normal appearance, no mucosal lesions or strictures visualized. Esophageal motility: Severe esophageal dysmotility visualized on exam. Tertiary contractions visualized. Retropulsion of barium visualized. Hiatal Hernia: None visualized. Gastroesophageal reflux: Gastroesophageal reflux visualized with provocative maneuvers. Ingested 13mm barium tablet: 13 mm barium tablet became stuck at the lower third of the esophagus, subsequent barium and water  were offered to the patient with little to no movement of the barium tablet. Patient informed tablet will dissolve. Other: Exam limited due to patient inability to stand or turn. IMPRESSION: Severe esophageal dysmotility visualized causing inability to pass a 13 mm barium tablet. No definite esophageal stricture. Gastroesophageal reflux visualized. Performed By Lavanda Jurist, PA-C Electronically Signed   By: Juliene Balder M.D.   On: 12/25/2023 15:27   DG CHEST PORT 1 VIEW Result Date: 12/24/2023 EXAM: 1 VIEW XRAY OF THE CHEST 12/24/2023 06:43:12 AM COMPARISON: 1 view chest x-ray 12/23/2023 and 1 view chest x-ray 12/20/2023. CLINICAL HISTORY: 141880 SOB (shortness of breath). FINDINGS: LUNGS AND PLEURA: Areas of  linear atelectasis are again noted at both lung bases. Aeration is slightly improved. Lung volumes remain low. No focal pulmonary opacity. No pulmonary edema. No pleural effusion. No pneumothorax. HEART AND MEDIASTINUM: No acute abnormality of the cardiac and mediastinal silhouettes. BONES AND SOFT TISSUES: No acute osseous abnormality. IMPRESSION: 1. No acute findings. 2. Linear atelectasis at both lung bases with slightly improved aeration. Electronically signed by: Lonni Necessary MD 12/24/2023 08:40 AM EDT RP Workstation: [id number]   DG CHEST PORT 1 VIEW Result Date: 12/23/2023 CLINICAL DATA:  Shortness of breath. EXAM: PORTABLE CHEST 1 VIEW COMPARISON:  12/20/2023 FINDINGS: The cardiac silhouette, mediastinal and hilar contours are within normal limits and stable given the AP projection and portable technique. Moderate deviation of the trachea right words is stable finding and likely due to thyroid . Low lung volumes with vascular crowding and streaky basilar atelectasis. Could not exclude developing pulmonary infiltrates. No pleural effusions. The bony thorax is intact. IMPRESSION: Low lung volumes with vascular crowding and streaky basilar atelectasis. Could not exclude developing pulmonary infiltrates. Electronically Signed   By: MYRTIS Stammer M.D.   On: 12/23/2023 16:38   DG FEMUR PORT MIN 2 VIEWS LEFT Result Date: 12/21/2023 CLINICAL DATA:  Postop fixation of left femur fracture. EXAM: LEFT FEMUR PORTABLE 2 VIEWS COMPARISON:  Intraoperative radiographs same date. Left femur radiographs 12/20/2023. FINDINGS: Status post left femoral intramedullary nail and dynamic compression screw fixation of the comminuted intertrochanteric/subtrochanteric fracture. The hardware appears well positioned. There are 2 distal interlocking screws. There is improved alignment of the fracture. No complications identified. Stable postsurgical changes from previous tibial ORIF. There is gas within the lateral soft  tissues of the hip. Skin staples are in place. IMPRESSION: Status post ORIF of the comminuted left femoral intertrochanteric/subtrochanteric fracture with improved alignment. No immediate postoperative complication. Electronically Signed   By: Elsie Perone M.D.   On: 12/21/2023 14:46   DG FEMUR MIN 2 VIEWS LEFT Result Date: 12/21/2023 CLINICAL DATA:  Left intertrochanteric femur fracture fixation. EXAM:  LEFT FEMUR 2 VIEWS COMPARISON:  Left femur radiographs 12/20/2023. FINDINGS: C-arm fluoroscopy was provided in the operating room without the presence of a radiologist.3 minutes and 5 seconds fluoroscopy time. 35.78 mGy air kerma. Twelve C-arm fluoroscopic images were obtained intraoperatively and are submitted for post operative interpretation. These images demonstrate open reduction and internal fixation of the common intertrochanteric and subtrochanteric fracture with an intramedullary nail and dynamic compression screw. There are 2 distal interlocking screws. There is improved alignment of the fracture. Pre-existing postsurgical changes in the proximal tibia are partially imaged. Please see intraoperative findings for further detail. IMPRESSION: Intraoperative fluoroscopic guidance for left femur fracture fixation. Electronically Signed   By: Elsie Perone M.D.   On: 12/21/2023 14:44   DG C-Arm 1-60 Min-No Report Result Date: 12/21/2023 Fluoroscopy was utilized by the requesting physician.  No radiographic interpretation.   DG C-Arm 1-60 Min-No Report Result Date: 12/21/2023 Fluoroscopy was utilized by the requesting physician.  No radiographic interpretation.   CT Head Wo Contrast Result Date: 12/20/2023 CLINICAL DATA:  Provided history: Head trauma, minor. Neck trauma. Fall. EXAM: CT HEAD WITHOUT CONTRAST CT CERVICAL SPINE WITHOUT CONTRAST TECHNIQUE: Multidetector CT imaging of the head and cervical spine was performed following the standard protocol without intravenous contrast. Multiplanar CT  image reconstructions of the cervical spine were also generated. RADIATION DOSE REDUCTION: This exam was performed according to the departmental dose-optimization program which includes automated exposure control, adjustment of the mA and/or kV according to patient size and/or use of iterative reconstruction technique. COMPARISON:  Head CT 09/04/2023. Cervical spine CT 09/04/2023. FINDINGS: CT HEAD FINDINGS Brain: Generalized cerebral atrophy. Patchy and ill-defined hypoattenuation within the cerebral white matter, nonspecific but compatible with moderate chronic small vessel ischemic disease. There is no acute intracranial hemorrhage. No demarcated cortical infarct. No extra-axial fluid collection. No evidence of an intracranial mass. No midline shift. Vascular: No hyperdense vessel. Atherosclerotic calcifications. Skull: No calvarial fracture or aggressive osseous lesion. Sinuses/Orbits: No mass or acute finding within the imaged orbits. Minimal mucosal thickening within the bilateral maxillary sinuses. CT CERVICAL SPINE FINDINGS Alignment: Mild levocurvature of the cervical spine. Nonspecific reversal of the expected cervical lordosis. 4 mm grade 1 anterolisthesis at C2-C3 and C3-C4. 2 mm grade 1 retrolisthesis at C4-C5, C5-C6 and T1-T2. Skull base and vertebrae: The basion-dental and atlanto-dental intervals are maintained.No evidence of acute fracture to the cervical spine. Developmental nonunion of the posterior arch of C1. Soft tissues and spinal canal: No prevertebral fluid or swelling. No visible canal hematoma. Subcentimeter thyroid  nodules not meeting consensus criteria for ultrasound follow-up based on size. No follow-up imaging recommended. Reference: J Am Coll Radiol. 2015 Feb;12(2): 143-50. Disc levels: Cervical spondylosis with multilevel disc space narrowing, disc bulges/central disc protrusions, posterior disc osteophyte complexes, uncovertebral hypertrophy and facet arthropathy. Disc space  narrowing is greatest at C4-C5, C5-C6 and C6-C7 (advanced at these levels). Multilevel spinal canal stenosis. Most notably at C5-C6, a posterior disc osteophyte complex contributes to at least moderate spinal canal stenosis. Multilevel bony neural foraminal narrowing. Vental osteophytes, most prominent at C4-C5. Degenerative changes also present at the C1-C2 articulation. Upper chest: No consolidation within the imaged lung apices. No visible pneumothorax. IMPRESSION: CT head: 1. No evidence of an acute intracranial abnormality. 2. Parenchymal atrophy and chronic small vessel ischemic disease. 3. Minor paranasal sinus mucosal thickening. CT cervical spine: 1. No evidence of an acute cervical spine fracture. 2. Grade 1 spondylolisthesis at C2-C3, C3-C4, C4-C5, C5-C6 and T1-T2. 3. Nonspecific reversal of the expected  cervical lordosis. 4. Mild levocurvature of the cervical spine. 5. Cervical spondylosis as described. Electronically Signed   By: Rockey Childs D.O.   On: 12/20/2023 16:54   CT Cervical Spine Wo Contrast Result Date: 12/20/2023 CLINICAL DATA:  Provided history: Head trauma, minor. Neck trauma. Fall. EXAM: CT HEAD WITHOUT CONTRAST CT CERVICAL SPINE WITHOUT CONTRAST TECHNIQUE: Multidetector CT imaging of the head and cervical spine was performed following the standard protocol without intravenous contrast. Multiplanar CT image reconstructions of the cervical spine were also generated. RADIATION DOSE REDUCTION: This exam was performed according to the departmental dose-optimization program which includes automated exposure control, adjustment of the mA and/or kV according to patient size and/or use of iterative reconstruction technique. COMPARISON:  Head CT 09/04/2023. Cervical spine CT 09/04/2023. FINDINGS: CT HEAD FINDINGS Brain: Generalized cerebral atrophy. Patchy and ill-defined hypoattenuation within the cerebral white matter, nonspecific but compatible with moderate chronic small vessel ischemic  disease. There is no acute intracranial hemorrhage. No demarcated cortical infarct. No extra-axial fluid collection. No evidence of an intracranial mass. No midline shift. Vascular: No hyperdense vessel. Atherosclerotic calcifications. Skull: No calvarial fracture or aggressive osseous lesion. Sinuses/Orbits: No mass or acute finding within the imaged orbits. Minimal mucosal thickening within the bilateral maxillary sinuses. CT CERVICAL SPINE FINDINGS Alignment: Mild levocurvature of the cervical spine. Nonspecific reversal of the expected cervical lordosis. 4 mm grade 1 anterolisthesis at C2-C3 and C3-C4. 2 mm grade 1 retrolisthesis at C4-C5, C5-C6 and T1-T2. Skull base and vertebrae: The basion-dental and atlanto-dental intervals are maintained.No evidence of acute fracture to the cervical spine. Developmental nonunion of the posterior arch of C1. Soft tissues and spinal canal: No prevertebral fluid or swelling. No visible canal hematoma. Subcentimeter thyroid  nodules not meeting consensus criteria for ultrasound follow-up based on size. No follow-up imaging recommended. Reference: J Am Coll Radiol. 2015 Feb;12(2): 143-50. Disc levels: Cervical spondylosis with multilevel disc space narrowing, disc bulges/central disc protrusions, posterior disc osteophyte complexes, uncovertebral hypertrophy and facet arthropathy. Disc space narrowing is greatest at C4-C5, C5-C6 and C6-C7 (advanced at these levels). Multilevel spinal canal stenosis. Most notably at C5-C6, a posterior disc osteophyte complex contributes to at least moderate spinal canal stenosis. Multilevel bony neural foraminal narrowing. Vental osteophytes, most prominent at C4-C5. Degenerative changes also present at the C1-C2 articulation. Upper chest: No consolidation within the imaged lung apices. No visible pneumothorax. IMPRESSION: CT head: 1. No evidence of an acute intracranial abnormality. 2. Parenchymal atrophy and chronic small vessel ischemic  disease. 3. Minor paranasal sinus mucosal thickening. CT cervical spine: 1. No evidence of an acute cervical spine fracture. 2. Grade 1 spondylolisthesis at C2-C3, C3-C4, C4-C5, C5-C6 and T1-T2. 3. Nonspecific reversal of the expected cervical lordosis. 4. Mild levocurvature of the cervical spine. 5. Cervical spondylosis as described. Electronically Signed   By: Rockey Childs D.O.   On: 12/20/2023 16:54   DG Femur Min 2 Views Left Result Date: 12/20/2023 CLINICAL DATA:  fall; 809823 Fall 190176. EXAM: LEFT FEMUR 2 VIEWS; PELVIS - 1-2 VIEW COMPARISON:  None Available. FINDINGS: There is comminuted and angulated intertrochanteric fracture of the proximal left femur. No other acute fracture or dislocation. No aggressive osseous lesion. Visualized sacral arcuate lines are unremarkable. Unremarkable symphysis pubis. Mild degenerative changes of left hip joint. Left knee joint is not well evaluated on this exam. Metallic hardware noted in the proximal left tibia. No radiopaque foreign bodies. IMPRESSION: *Comminuted and angulated intertrochanteric fracture of the proximal left femur. Electronically Signed   By: Ree  Kimberlee M.D.   On: 12/20/2023 16:23   DG Pelvis 1-2 Views Result Date: 12/20/2023 CLINICAL DATA:  fall; 809823 Fall 190176. EXAM: LEFT FEMUR 2 VIEWS; PELVIS - 1-2 VIEW COMPARISON:  None Available. FINDINGS: There is comminuted and angulated intertrochanteric fracture of the proximal left femur. No other acute fracture or dislocation. No aggressive osseous lesion. Visualized sacral arcuate lines are unremarkable. Unremarkable symphysis pubis. Mild degenerative changes of left hip joint. Left knee joint is not well evaluated on this exam. Metallic hardware noted in the proximal left tibia. No radiopaque foreign bodies. IMPRESSION: *Comminuted and angulated intertrochanteric fracture of the proximal left femur. Electronically Signed   By: Ree Kimberlee M.D.   On: 12/20/2023 16:23   DG Chest 1 View Result  Date: 12/20/2023 CLINICAL DATA:  Fall. EXAM: CHEST  1 VIEW COMPARISON:  09/04/2023. FINDINGS: Bilateral lung fields are clear. Bilateral costophrenic angles are clear. Normal cardio-mediastinal silhouette. No acute osseous abnormalities. Right shoulder arthroplasty partially seen. The soft tissues are within normal limits. IMPRESSION: No active disease. Electronically Signed   By: Ree Kimberlee M.D.   On: 12/20/2023 16:20    Microbiology: Results for orders placed or performed during the hospital encounter of 12/20/23  Surgical pcr screen     Status: None   Collection Time: 12/21/23  9:34 AM   Specimen: Nasal Mucosa; Nasal Swab  Result Value Ref Range Status   MRSA, PCR NEGATIVE NEGATIVE Final   Staphylococcus aureus NEGATIVE NEGATIVE Final    Comment: (NOTE) The Xpert SA Assay (FDA approved for NASAL specimens in patients 44 years of age and older), is one component of a comprehensive surveillance program. It is not intended to diagnose infection nor to guide or monitor treatment. Performed at [facility], [address] [place], [place] [address]    Labs: CBC: Recent Labs  Lab 12/22/23 [phone] 12/23/23 1104 12/24/23 0826 12/25/23 0947 12/26/23 0620  WBC 10.2 8.3 10.2 9.3 9.5  NEUTROABS 7.3 6.5 7.9* 6.8 6.6  HGB 7.8* 7.3* 7.4* 7.7* 7.7*  HCT 24.7* 22.2* 23.4* 23.9* 24.1*  MCV 99.2 94.9 96.7 97.6 97.2  PLT 259 262 310 321 359   Basic Metabolic Panel: Recent Labs  Lab 12/22/23 0954 12/23/23 1104 12/24/23 0826 12/25/23 0947 12/26/23 0620  NA 130* 127* 133* 130* 133*  K 4.4 4.0 4.1 4.1 3.9  CL 96* 97* 99 94* 95*  CO2 23 23 24 26 27   GLUCOSE 102* 132* 131* 107* 103*  BUN 13 15 8  7* 7*  CREATININE 1.04* 0.93 0.72 0.78 0.72  CALCIUM  8.2* 8.0* 8.3* 8.4* 8.7*  MG 1.8 2.1 2.0 1.8 2.1  PHOS 2.7 2.7 2.8 2.7 3.3   Liver Function Tests: Recent Labs  Lab 12/22/23 0954 12/23/23 1104 12/24/23 0826 12/25/23 0947 12/26/23 0620  AST 41 45* 34 27 25  ALT 15 13 12 12  13   ALKPHOS 64 65 81 75 72  BILITOT 0.5 0.5 0.8 0.7 0.8  PROT 5.7* 5.9* 5.7* 5.8* 5.8*  ALBUMIN  2.8* 2.6* 2.6* 2.5* 2.5*   CBG: No results for input(s): GLUCAP in the last 168 hours.  Discharge time spent: greater than 30 minutes.  Signed: Alejandro Marker, DO [facility] 12/26/2023

## 2023-12-26 NOTE — Progress Notes (Signed)
 OT Cancellation Note  Patient Details Name: SAYDI KOBEL MRN: [id number] DOB: March 26, 1946   Cancelled Treatment:    Reason Eval/Treat Not Completed: Patient at procedure or test/ unavailable Pt unable to participate in skilled OT session this afternoon. Currently with pharmacy discussing medication prior to discharge. Plan to discharge to SNF.   Leita Howell, OTR/L,CBIS  Supplemental OT - [facility] and [facility] Secure Chat Preferred   12/26/2023, 3:26 PM

## 2023-12-27 DIAGNOSIS — K219 Gastro-esophageal reflux disease without esophagitis: Secondary | ICD-10-CM | POA: Diagnosis not present

## 2023-12-27 DIAGNOSIS — J9601 Acute respiratory failure with hypoxia: Secondary | ICD-10-CM | POA: Diagnosis not present

## 2023-12-27 DIAGNOSIS — M810 Age-related osteoporosis without current pathological fracture: Secondary | ICD-10-CM | POA: Diagnosis not present

## 2023-12-27 DIAGNOSIS — R1319 Other dysphagia: Secondary | ICD-10-CM | POA: Diagnosis not present

## 2023-12-27 DIAGNOSIS — Z7901 Long term (current) use of anticoagulants: Secondary | ICD-10-CM | POA: Diagnosis not present

## 2023-12-27 DIAGNOSIS — S72142A Displaced intertrochanteric fracture of left femur, initial encounter for closed fracture: Secondary | ICD-10-CM | POA: Diagnosis not present

## 2023-12-29 ENCOUNTER — Other Ambulatory Visit: Payer: Self-pay

## 2023-12-29 ENCOUNTER — Other Ambulatory Visit (HOSPITAL_COMMUNITY): Payer: Self-pay

## 2023-12-29 DIAGNOSIS — Z5189 Encounter for other specified aftercare: Secondary | ICD-10-CM | POA: Diagnosis not present

## 2023-12-29 DIAGNOSIS — M6281 Muscle weakness (generalized): Secondary | ICD-10-CM | POA: Diagnosis not present

## 2023-12-29 DIAGNOSIS — R1310 Dysphagia, unspecified: Secondary | ICD-10-CM | POA: Diagnosis not present

## 2023-12-29 DIAGNOSIS — J9601 Acute respiratory failure with hypoxia: Secondary | ICD-10-CM | POA: Diagnosis not present

## 2023-12-29 DIAGNOSIS — K219 Gastro-esophageal reflux disease without esophagitis: Secondary | ICD-10-CM | POA: Diagnosis not present

## 2023-12-29 DIAGNOSIS — S72142D Displaced intertrochanteric fracture of left femur, subsequent encounter for closed fracture with routine healing: Secondary | ICD-10-CM | POA: Diagnosis not present

## 2023-12-29 DIAGNOSIS — R2689 Other abnormalities of gait and mobility: Secondary | ICD-10-CM | POA: Diagnosis not present

## 2023-12-29 DIAGNOSIS — S72002A Fracture of unspecified part of neck of left femur, initial encounter for closed fracture: Secondary | ICD-10-CM | POA: Diagnosis not present

## 2023-12-29 DIAGNOSIS — M810 Age-related osteoporosis without current pathological fracture: Secondary | ICD-10-CM | POA: Diagnosis not present

## 2024-01-01 DIAGNOSIS — J9601 Acute respiratory failure with hypoxia: Secondary | ICD-10-CM | POA: Diagnosis not present

## 2024-01-01 DIAGNOSIS — M810 Age-related osteoporosis without current pathological fracture: Secondary | ICD-10-CM | POA: Diagnosis not present

## 2024-01-01 DIAGNOSIS — D649 Anemia, unspecified: Secondary | ICD-10-CM | POA: Diagnosis not present

## 2024-01-01 DIAGNOSIS — S72142A Displaced intertrochanteric fracture of left femur, initial encounter for closed fracture: Secondary | ICD-10-CM | POA: Diagnosis not present

## 2024-01-01 DIAGNOSIS — R1312 Dysphagia, oropharyngeal phase: Secondary | ICD-10-CM | POA: Diagnosis not present

## 2024-01-02 DIAGNOSIS — R2689 Other abnormalities of gait and mobility: Secondary | ICD-10-CM | POA: Diagnosis not present

## 2024-01-02 DIAGNOSIS — D500 Iron deficiency anemia secondary to blood loss (chronic): Secondary | ICD-10-CM | POA: Diagnosis not present

## 2024-01-02 DIAGNOSIS — M6281 Muscle weakness (generalized): Secondary | ICD-10-CM | POA: Diagnosis not present

## 2024-01-02 DIAGNOSIS — I341 Nonrheumatic mitral (valve) prolapse: Secondary | ICD-10-CM | POA: Diagnosis not present

## 2024-01-02 DIAGNOSIS — I119 Hypertensive heart disease without heart failure: Secondary | ICD-10-CM | POA: Diagnosis not present

## 2024-01-02 DIAGNOSIS — M818 Other osteoporosis without current pathological fracture: Secondary | ICD-10-CM | POA: Diagnosis not present

## 2024-01-02 DIAGNOSIS — I7300 Raynaud's syndrome without gangrene: Secondary | ICD-10-CM | POA: Diagnosis not present

## 2024-01-02 DIAGNOSIS — J9601 Acute respiratory failure with hypoxia: Secondary | ICD-10-CM | POA: Diagnosis not present

## 2024-01-02 DIAGNOSIS — K224 Dyskinesia of esophagus: Secondary | ICD-10-CM | POA: Diagnosis not present

## 2024-01-02 DIAGNOSIS — S72142A Displaced intertrochanteric fracture of left femur, initial encounter for closed fracture: Secondary | ICD-10-CM | POA: Diagnosis not present

## 2024-01-02 DIAGNOSIS — R1310 Dysphagia, unspecified: Secondary | ICD-10-CM | POA: Diagnosis not present

## 2024-01-02 DIAGNOSIS — S72142D Displaced intertrochanteric fracture of left femur, subsequent encounter for closed fracture with routine healing: Secondary | ICD-10-CM | POA: Diagnosis not present

## 2024-01-03 DIAGNOSIS — R1312 Dysphagia, oropharyngeal phase: Secondary | ICD-10-CM | POA: Diagnosis not present

## 2024-01-03 DIAGNOSIS — S7292XA Unspecified fracture of left femur, initial encounter for closed fracture: Secondary | ICD-10-CM | POA: Diagnosis not present

## 2024-01-03 DIAGNOSIS — M810 Age-related osteoporosis without current pathological fracture: Secondary | ICD-10-CM | POA: Diagnosis not present

## 2024-01-03 DIAGNOSIS — Z5189 Encounter for other specified aftercare: Secondary | ICD-10-CM | POA: Diagnosis not present

## 2024-01-03 DIAGNOSIS — J9610 Chronic respiratory failure, unspecified whether with hypoxia or hypercapnia: Secondary | ICD-10-CM | POA: Diagnosis not present

## 2024-01-05 DIAGNOSIS — R2689 Other abnormalities of gait and mobility: Secondary | ICD-10-CM | POA: Diagnosis not present

## 2024-01-05 DIAGNOSIS — R1312 Dysphagia, oropharyngeal phase: Secondary | ICD-10-CM | POA: Diagnosis not present

## 2024-01-05 DIAGNOSIS — M810 Age-related osteoporosis without current pathological fracture: Secondary | ICD-10-CM | POA: Diagnosis not present

## 2024-01-05 DIAGNOSIS — M6281 Muscle weakness (generalized): Secondary | ICD-10-CM | POA: Diagnosis not present

## 2024-01-05 DIAGNOSIS — S72002A Fracture of unspecified part of neck of left femur, initial encounter for closed fracture: Secondary | ICD-10-CM | POA: Diagnosis not present

## 2024-01-05 DIAGNOSIS — S72142D Displaced intertrochanteric fracture of left femur, subsequent encounter for closed fracture with routine healing: Secondary | ICD-10-CM | POA: Diagnosis not present

## 2024-01-05 DIAGNOSIS — J9601 Acute respiratory failure with hypoxia: Secondary | ICD-10-CM | POA: Diagnosis not present

## 2024-01-08 ENCOUNTER — Other Ambulatory Visit (HOSPITAL_COMMUNITY): Payer: Self-pay

## 2024-01-09 ENCOUNTER — Other Ambulatory Visit (HOSPITAL_COMMUNITY): Payer: Self-pay

## 2024-01-09 DIAGNOSIS — R2689 Other abnormalities of gait and mobility: Secondary | ICD-10-CM | POA: Diagnosis not present

## 2024-01-09 DIAGNOSIS — S72142D Displaced intertrochanteric fracture of left femur, subsequent encounter for closed fracture with routine healing: Secondary | ICD-10-CM | POA: Diagnosis not present

## 2024-01-09 DIAGNOSIS — M6281 Muscle weakness (generalized): Secondary | ICD-10-CM | POA: Diagnosis not present

## 2024-01-10 ENCOUNTER — Other Ambulatory Visit (INDEPENDENT_AMBULATORY_CARE_PROVIDER_SITE_OTHER)

## 2024-01-10 ENCOUNTER — Ambulatory Visit (INDEPENDENT_AMBULATORY_CARE_PROVIDER_SITE_OTHER): Admitting: Physician Assistant

## 2024-01-10 ENCOUNTER — Encounter: Payer: Self-pay | Admitting: Physician Assistant

## 2024-01-10 DIAGNOSIS — S72142A Displaced intertrochanteric fracture of left femur, initial encounter for closed fracture: Secondary | ICD-10-CM | POA: Diagnosis not present

## 2024-01-10 DIAGNOSIS — M25552 Pain in left hip: Secondary | ICD-10-CM

## 2024-01-10 DIAGNOSIS — S72142G Displaced intertrochanteric fracture of left femur, subsequent encounter for closed fracture with delayed healing: Secondary | ICD-10-CM

## 2024-01-10 DIAGNOSIS — R1312 Dysphagia, oropharyngeal phase: Secondary | ICD-10-CM | POA: Diagnosis not present

## 2024-01-10 DIAGNOSIS — M810 Age-related osteoporosis without current pathological fracture: Secondary | ICD-10-CM | POA: Diagnosis not present

## 2024-01-10 DIAGNOSIS — Z5189 Encounter for other specified aftercare: Secondary | ICD-10-CM | POA: Diagnosis not present

## 2024-01-10 DIAGNOSIS — J9610 Chronic respiratory failure, unspecified whether with hypoxia or hypercapnia: Secondary | ICD-10-CM | POA: Diagnosis not present

## 2024-01-10 NOTE — Progress Notes (Signed)
 HPI: Tiffany [name] returns today 2 weeks status post IM nailing left hip intertrochanteric/subtrochanteric comminuted fracture.  She is at [facility].  Ranks her pain to be 7 out of 10 pain.  She is receiving oxycodone  for this pain.  She is on oxycodone  prior to admission.  She is unable to take aspirin and is on Eliquis  for DVT prophylaxis.  She remains touchdown weightbearing left lower extremity.  Review of systems: Negative fevers chills  Physical exam: General Well-developed well-nourished pleasant female seated in wheelchair. Left hip surgical incisions are healing well no signs of infection or dehiscence.  Staples well-approximated the incisions.  Left calf supple nontender.  Dorsiflexion plantarflexion left ankle intact.  Virtually no internal and external rotation secondary to pain left hip actively and passively.  Radiographs: Left femur multiple views: Status post IM nailing left hip intertrochanteric fracture.  No change in overall position alignment compared to films 12/21/2023 postop.  No significant consolidation.  There is no hardware failure.  The hip is well located.  Knee is well located.  Impression: Status post left intertrochanteric subtrochanteric comminuted proximal femur fracture IM nailing  Plan: Staples harvested Steri-Strips applied.  She is able to get the incisions wet in the shower.  She is 50% weightbearing on the left lower extremity.  Questions were encouraged and answered at length.  Continue Eliquis  for DVT prophylaxis for total of 4 weeks as planned.  Follow-up with Dr. Vernetta in 1 month AP and lateral views of the left femur at that time.

## 2024-01-12 DIAGNOSIS — S72142D Displaced intertrochanteric fracture of left femur, subsequent encounter for closed fracture with routine healing: Secondary | ICD-10-CM | POA: Diagnosis not present

## 2024-01-12 DIAGNOSIS — R2689 Other abnormalities of gait and mobility: Secondary | ICD-10-CM | POA: Diagnosis not present

## 2024-01-12 DIAGNOSIS — M6281 Muscle weakness (generalized): Secondary | ICD-10-CM | POA: Diagnosis not present

## 2024-01-15 ENCOUNTER — Other Ambulatory Visit: Payer: Self-pay

## 2024-01-15 ENCOUNTER — Other Ambulatory Visit (HOSPITAL_COMMUNITY): Payer: Self-pay

## 2024-01-15 ENCOUNTER — Encounter: Payer: Self-pay | Admitting: Pharmacist

## 2024-01-15 MED ORDER — PAROXETINE HCL 40 MG PO TABS
40.0000 mg | ORAL_TABLET | Freq: Every evening | ORAL | 0 refills | Status: DC
  Filled 2024-01-15 – 2024-02-22 (×2): qty 30, 30d supply, fill #0

## 2024-01-15 MED ORDER — OXYCODONE HCL 5 MG PO TABS
5.0000 mg | ORAL_TABLET | Freq: Three times a day (TID) | ORAL | 0 refills | Status: DC | PRN
  Filled 2024-01-15: qty 30, 10d supply, fill #0

## 2024-01-15 MED ORDER — PANTOPRAZOLE SODIUM 40 MG PO TBEC
40.0000 mg | DELAYED_RELEASE_TABLET | Freq: Two times a day (BID) | ORAL | 0 refills | Status: DC
  Filled 2024-01-15: qty 60, 30d supply, fill #0

## 2024-01-15 MED ORDER — ALBUTEROL SULFATE (2.5 MG/3ML) 0.083% IN NEBU
3.0000 mL | INHALATION_SOLUTION | RESPIRATORY_TRACT | 0 refills | Status: AC | PRN
  Filled 2024-01-15: qty 540, 30d supply, fill #0

## 2024-01-15 MED ORDER — NORTRIPTYLINE HCL 50 MG PO CAPS
150.0000 mg | ORAL_CAPSULE | Freq: Every evening | ORAL | 0 refills | Status: AC
  Filled 2024-01-15: qty 45, 45d supply, fill #0
  Filled 2024-02-22 (×2): qty 45, 15d supply, fill #0
  Filled 2024-02-22: qty 45, 45d supply, fill #0

## 2024-01-15 MED ORDER — METHOCARBAMOL 500 MG PO TABS
500.0000 mg | ORAL_TABLET | Freq: Every day | ORAL | 0 refills | Status: AC
  Filled 2024-01-15: qty 30, 30d supply, fill #0

## 2024-01-15 MED ORDER — BUSPIRONE HCL 5 MG PO TABS
5.0000 mg | ORAL_TABLET | Freq: Three times a day (TID) | ORAL | 0 refills | Status: AC
  Filled 2024-01-15: qty 90, 30d supply, fill #0

## 2024-01-15 MED ORDER — ELIQUIS 2.5 MG PO TABS
2.5000 mg | ORAL_TABLET | Freq: Two times a day (BID) | ORAL | 0 refills | Status: AC
  Filled 2024-01-15 – 2024-03-14 (×2): qty 60, 30d supply, fill #0

## 2024-01-15 MED ORDER — ATORVASTATIN CALCIUM 10 MG PO TABS
10.0000 mg | ORAL_TABLET | Freq: Every evening | ORAL | 0 refills | Status: DC
  Filled 2024-01-15 – 2024-02-07 (×3): qty 30, 30d supply, fill #0

## 2024-01-15 MED ORDER — AMLODIPINE BESYLATE 2.5 MG PO TABS
2.5000 mg | ORAL_TABLET | Freq: Every day | ORAL | 0 refills | Status: DC
  Filled 2024-01-15 – 2024-02-07 (×3): qty 30, 30d supply, fill #0

## 2024-01-15 NOTE — Progress Notes (Signed)
 Pharmacy Quality Measure Review  This patient is appearing on a report for being at risk of failing the adherence measure for cholesterol (statin) medications this calendar year.   Medication: Atorvastatin  Last fill date: 11/16/23 for 30 day supply  Insurance report was not up to date. No action needed at this time.  Medication has been refilled as of 12/29/23 at [facility] pharmacy  2 refills remaining.

## 2024-01-16 ENCOUNTER — Telehealth: Payer: Self-pay

## 2024-01-16 ENCOUNTER — Ambulatory Visit (INDEPENDENT_AMBULATORY_CARE_PROVIDER_SITE_OTHER): Admitting: Family Medicine

## 2024-01-16 ENCOUNTER — Encounter: Payer: Self-pay | Admitting: Family Medicine

## 2024-01-16 VITALS — BP 110/62 | HR 91 | Temp 98.2°F | Ht 62.75 in | Wt 130.5 lb

## 2024-01-16 DIAGNOSIS — R1319 Other dysphagia: Secondary | ICD-10-CM | POA: Diagnosis not present

## 2024-01-16 DIAGNOSIS — Z8781 Personal history of (healed) traumatic fracture: Secondary | ICD-10-CM | POA: Insufficient documentation

## 2024-01-16 DIAGNOSIS — R29898 Other symptoms and signs involving the musculoskeletal system: Secondary | ICD-10-CM | POA: Diagnosis not present

## 2024-01-16 DIAGNOSIS — S72142D Displaced intertrochanteric fracture of left femur, subsequent encounter for closed fracture with routine healing: Secondary | ICD-10-CM

## 2024-01-16 DIAGNOSIS — M80052D Age-related osteoporosis with current pathological fracture, left femur, subsequent encounter for fracture with routine healing: Secondary | ICD-10-CM

## 2024-01-16 DIAGNOSIS — E871 Hypo-osmolality and hyponatremia: Secondary | ICD-10-CM | POA: Insufficient documentation

## 2024-01-16 DIAGNOSIS — M810 Age-related osteoporosis without current pathological fracture: Secondary | ICD-10-CM

## 2024-01-16 DIAGNOSIS — I10 Essential (primary) hypertension: Secondary | ICD-10-CM

## 2024-01-16 DIAGNOSIS — R0902 Hypoxemia: Secondary | ICD-10-CM | POA: Insufficient documentation

## 2024-01-16 NOTE — Assessment & Plan Note (Signed)
 Acute, doing well.  Staples removed from left lower leg that were missed at orthopedic office visit.  Good approximation of wound with no redness or discharge.  Patient refused Steri-Strip application.  Tiffany [name] is still at 50% weightbearing and I do not believe Tiffany [name] is ready for physical therapy but states that Tiffany [name] has a significant need for home health for her activities of daily living.  Tiffany [name] has significant decreased in mobility.  Tiffany [name] has no skilled nursing and needs. Referral placed for home health and for shower chair.

## 2024-01-16 NOTE — Assessment & Plan Note (Signed)
 Chronic, worsened control off of Fosamax . Patient is willing at this point to do a bone density to assess extent of osteoporosis with plan of starting Prolia.

## 2024-01-16 NOTE — Progress Notes (Addendum)
 Patient ID: Tiffany [name], female    DOB: 04-07-1946, 78 y.o.   MRN: [id number]  This visit was conducted in person.  BP 110/62   Pulse 91   Temp 98.2 F (36.8 C) (Temporal)   Ht 5' 2.75 (1.594 m)   Wt 130 lb 8 oz (59.2 kg)   SpO2 96%   BMI 23.30 kg/m    CC:  Chief Complaint  Patient presents with   Hospitalization Follow-up    Subjective:   HPI: SHEILA [name] is a 78 y.o. female presenting on 01/16/2024 for Hospitalization Follow-up  Recent hospital admission on July 9 following fall resulting in closed intertrochanteric fracture of left hip. Fracture fixation on December 21, 2023 Discharged from the hospital on July 15 with home health. Recommended follow-up with PCP in 1 to 2 weeks from discharge for CBC, CMP, mag,phos.  Has osteoporosis..  Was on Fosamax  previously.  Now not on any medication  Initially with acute respiratory failure with hypoxia.  Chest x-ray showed low lung volumes with vascular crowding and streaky basilar atelectasis. Recommended oxygen in the outpatient setting. She was sent home with oxygen.. but  has not been using it.  No current SOB, wheeze, cough, no fever.   Felt if hypoxia persisted could consider CTA of the chest to rule out PE as her D-dimer was 310.  While in the hospital she had a swallowing study recommending dysphagia 3 diet Esophagram showed severe esophageal dysmotility.  GI was consulted Felt she should continue to dysphagia 3 diet and upright for all meals. Did not recommend repeating EGD/dilation inpatient but recommended outpatient follow-up with GI. She denies chocking on food when eating... feels better after dilation in 09/2023.  Has had follow-up appointment with orthopedics on July 30.  Note reviewed in detail. Staples removed and Steri-Strips applied.  50% weightbearing, continued Eliquis  for DVT prophylaxis for total of 4 weeks.  Has additional follow-up with Dr. Vernetta with repeat films of left femur at that  time.  Daughter reports she is unable to do ADL.SABRA needs help dressing, bathing, toileting  Able to feed self.   Able to get in wheel chair from bed. Needs a shower chair.  Relevant past medical, surgical, family and social history reviewed and updated as indicated. Interim medical history since our last visit reviewed. Allergies and medications reviewed and updated. Outpatient Medications Prior to Visit  Medication Sig Dispense Refill   busPIRone  (BUSPAR ) 5 MG tablet Take 1 tablet (5 mg total) by mouth 3 (three) times daily as needed. 90 tablet 1   acetaminophen  (TYLENOL ) 500 MG tablet Take 2 tablets (1,000 mg total) by mouth every 6 (six) hours as needed for mild pain (pain score 1-3).     albuterol  (PROVENTIL ) (2.5 MG/3ML) 0.083% nebulizer solution Take 3 mLs by nebulization every 4 (four) hours as needed for wheezing/SOB 540 mL 0   amLODipine  (NORVASC ) 2.5 MG tablet Take 1 tablet (2.5 mg total) by mouth daily for RAYNAUDS 30 tablet 0   apixaban  (ELIQUIS ) 2.5 MG TABS tablet Take 1 tablet (2.5 mg total) by mouth 2 (two) times daily VTE PROPHYLAXIS 60 tablet 0   atorvastatin  (LIPITOR) 10 MG tablet Take 1 tablet (10 mg total) by mouth at bedtime for HLD . 30 tablet 0   busPIRone  (BUSPAR ) 5 MG tablet Take 1 tablet (5 mg total) by mouth 3 (three) times daily as needed for anxiety 90 tablet 0   Cholecalciferol  (VITAMIN D3 PO) Take 1 tablet by mouth  daily.     docusate sodium  (COLACE) 100 MG capsule Take 1 capsule (100 mg total) by mouth 2 (two) times daily. 10 capsule 0   lidocaine  4 % Place 1 patch onto the skin daily. 5 patch 0   methocarbamol  (ROBAXIN ) 500 MG tablet Take 1 tablet (500 mg total) by mouth daily as needed, do not give within 6 hours of regularly scheduled dose muscle spasms 30 tablet 0   nortriptyline  (PAMELOR ) 50 MG capsule Take 1 capsule (50 mg total) by mouth at bedtime for anxiety 45 capsule 0   oxyCODONE  (OXY IR/ROXICODONE ) 5 MG immediate release tablet Take 1 tablet (5 mg  total) by mouth every 8 (eight) hours as needed for pain . 30 tablet 0   PARoxetine  (PAXIL ) 40 MG tablet Take 1 tablet (40 mg total) by mouth every evening for depression 30 tablet 0   polyethylene glycol powder (GLYCOLAX /MIRALAX ) 17 GM/SCOOP powder Mix 17 grams in beverage and take by mouth daily as needed for mild constipation. 238 g 0   sennosides-docusate sodium  (SENOKOT-S) 8.6-50 MG tablet Take 2 tablets by mouth daily. 30 tablet 1   vitamin B-12 (CYANOCOBALAMIN ) 1000 MCG tablet Take 1,000 mcg by mouth daily.     albuterol  (PROVENTIL ) (2.5 MG/3ML) 0.083% nebulizer solution Take 3 mLs (2.5 mg total) by nebulization every 4 (four) hours as needed for wheezing or shortness of breath. 75 mL 12   amLODipine  (NORVASC ) 2.5 MG tablet Take 1 tablet (2.5 mg total) by mouth daily. FOR RAYNAUDS 30 tablet 11   atorvastatin  (LIPITOR) 10 MG tablet Take 1 tablet (10 mg total) by mouth daily. 30 tablet 6   menthol -cetylpyridinium (CEPACOL) 3 MG lozenge Take 1 lozenge (3 mg total) by mouth as needed for sore throat. 100 tablet 12   methocarbamol  (ROBAXIN ) 500 MG tablet Take 1 tablet (500 mg total) by mouth 4 (four) times daily as needed. 120 tablet 2   pantoprazole  (PROTONIX ) 40 MG tablet Take 1 tablet (40 mg) twice a day before a meal for 6 weeks, then 1 tablet (40 mg) once daily indefinitely 90 tablet 4   pantoprazole  (PROTONIX ) 40 MG tablet Take 1 tablet (40 mg total) by mouth 2 (two) times daily for GERD 60 tablet 0   PARoxetine  (PAXIL ) 40 MG tablet Take 1/2-1 tablet (20-40 mg total) by mouth daily as directed 30 tablet 3   PARoxetine  (PAXIL ) 40 MG tablet Take 1/2-1 tablet (20-40 mg total) by mouth daily as directed. 30 tablet 3   No facility-administered medications prior to visit.     Per HPI unless specifically indicated in ROS section below Review of Systems  Constitutional:  Negative for fatigue and fever.  HENT:  Negative for congestion.   Eyes:  Negative for pain.  Respiratory:  Negative for  cough and shortness of breath.   Cardiovascular:  Positive for leg swelling. Negative for chest pain and palpitations.  Gastrointestinal:  Negative for abdominal pain.  Genitourinary:  Negative for dysuria and vaginal bleeding.  Musculoskeletal:  Positive for arthralgias and gait problem. Negative for back pain.  Neurological:  Negative for syncope, light-headedness and headaches.  Psychiatric/Behavioral:  Negative for dysphoric mood.    Objective:  BP 110/62   Pulse 91   Temp 98.2 F (36.8 C) (Temporal)   Ht 5' 2.75 (1.594 m)   Wt 130 lb 8 oz (59.2 kg)   SpO2 96%   BMI 23.30 kg/m   Wt Readings from Last 3 Encounters:  01/16/24 130 lb 8 oz (59.2  kg)  12/21/23 129 lb 13.6 oz (58.9 kg)  11/15/23 131 lb 3.2 oz (59.5 kg)      Physical Exam Constitutional:      General: She is not in acute distress.    Appearance: Normal appearance. She is well-developed. She is not ill-appearing or toxic-appearing.  HENT:     Head: Normocephalic.     Right Ear: Hearing, tympanic membrane, ear canal and external ear normal. Tympanic membrane is not erythematous, retracted or bulging.     Left Ear: Hearing, tympanic membrane, ear canal and external ear normal. Tympanic membrane is not erythematous, retracted or bulging.     Nose: No mucosal edema or rhinorrhea.     Right Sinus: No maxillary sinus tenderness or frontal sinus tenderness.     Left Sinus: No maxillary sinus tenderness or frontal sinus tenderness.     Mouth/Throat:     Pharynx: Uvula midline.  Eyes:     General: Lids are normal. Lids are everted, no foreign bodies appreciated.     Conjunctiva/sclera: Conjunctivae normal.     Pupils: Pupils are equal, round, and reactive to light.  Neck:     Thyroid : No thyroid  mass or thyromegaly.     Vascular: No carotid bruit.     Trachea: Trachea normal.  Cardiovascular:     Rate and Rhythm: Normal rate and regular rhythm.     Pulses: Normal pulses.     Heart sounds: Normal heart sounds, S1  normal and S2 normal. No murmur heard.    No friction rub. No gallop.  Pulmonary:     Effort: Pulmonary effort is normal. No tachypnea or respiratory distress.     Breath sounds: Normal breath sounds. No decreased breath sounds, wheezing, rhonchi or rales.  Abdominal:     General: Bowel sounds are normal.     Palpations: Abdomen is soft.     Tenderness: There is no abdominal tenderness.  Musculoskeletal:     Cervical back: Normal range of motion and neck supple.     Left hip: Decreased range of motion.  Skin:    General: Skin is warm and dry.     Findings: No rash.  Neurological:     Mental Status: She is alert.     Cranial Nerves: Cranial nerves 2-12 are intact.     Sensory: Sensation is intact.     Motor: Weakness present.     Coordination: Coordination is intact.     Gait: Gait abnormal.  Psychiatric:        Mood and Affect: Mood is not anxious or depressed.        Speech: Speech normal.        Behavior: Behavior normal. Behavior is cooperative.        Thought Content: Thought content normal.        Judgment: Judgment normal.       Results for orders placed or performed during the hospital encounter of 12/20/23  Comprehensive metabolic panel   Collection Time: 12/20/23  7:03 PM  Result Value Ref Range   Sodium 135 135 - 145 mmol/L   Potassium 4.6 3.5 - 5.1 mmol/L   Chloride 100 98 - 111 mmol/L   CO2 24 22 - 32 mmol/L   Glucose, Bld 114 (H) 70 - 99 mg/dL   BUN 12 8 - 23 mg/dL   Creatinine, Ser 9.00 0.44 - 1.00 mg/dL   Calcium  8.9 8.9 - 10.3 mg/dL   Total Protein 6.6 6.5 - 8.1 g/dL  Albumin  3.6 3.5 - 5.0 g/dL   AST 28 15 - 41 U/L   ALT 16 0 - 44 U/L   Alkaline Phosphatase 86 38 - 126 U/L   Total Bilirubin 0.8 0.0 - 1.2 mg/dL   GFR, Estimated 58 (L) >60 mL/min   Anion gap 11 5 - 15  Type and screen [facility]   Collection Time: 12/20/23  9:35 PM  Result Value Ref Range   ABO/RH(D) A NEG    Antibody Screen NEG    Sample Expiration       12/23/2023,2359 Performed at [facility], [address], [place], [place] [address]   CBC with Differential/Platelet   Collection Time: 12/20/23  9:43 PM  Result Value Ref Range   WBC 11.9 (H) 4.0 - 10.5 K/uL   RBC 3.57 (L) 3.87 - 5.11 MIL/uL   Hemoglobin 11.0 (L) 12.0 - 15.0 g/dL   HCT 65.3 (L) 63.9 - 53.9 %   MCV 96.9 80.0 - 100.0 fL   MCH 30.8 26.0 - 34.0 pg   MCHC 31.8 30.0 - 36.0 g/dL   RDW 86.3 88.4 - 84.4 %   Platelets 324 150 - 400 K/uL   nRBC 0.0 0.0 - 0.2 %   Neutrophils Relative % 84 %   Neutro Abs 9.9 (H) 1.7 - 7.7 K/uL   Lymphocytes Relative 9 %   Lymphs Abs 1.1 0.7 - 4.0 K/uL   Monocytes Relative 7 %   Monocytes Absolute 0.8 0.1 - 1.0 K/uL   Eosinophils Relative 0 %   Eosinophils Absolute 0.0 0.0 - 0.5 K/uL   Basophils Relative 0 %   Basophils Absolute 0.1 0.0 - 0.1 K/uL   Immature Granulocytes 0 %   Abs Immature Granulocytes 0.04 0.00 - 0.07 K/uL  CK   Collection Time: 12/20/23  9:43 PM  Result Value Ref Range   Total CK 338 (H) 38 - 234 U/L  Basic metabolic panel with GFR   Collection Time: 12/21/23  7:34 AM  Result Value Ref Range   Sodium 138 135 - 145 mmol/L   Potassium 3.9 3.5 - 5.1 mmol/L   Chloride 102 98 - 111 mmol/L   CO2 24 22 - 32 mmol/L   Glucose, Bld 118 (H) 70 - 99 mg/dL   BUN 11 8 - 23 mg/dL   Creatinine, Ser 9.11 0.44 - 1.00 mg/dL   Calcium  8.8 (L) 8.9 - 10.3 mg/dL   GFR, Estimated >39 >39 mL/min   Anion gap 12 5 - 15  CBC   Collection Time: 12/21/23  7:34 AM  Result Value Ref Range   WBC 12.3 (H) 4.0 - 10.5 K/uL   RBC 3.40 (L) 3.87 - 5.11 MIL/uL   Hemoglobin 10.5 (L) 12.0 - 15.0 g/dL   HCT 67.3 (L) 63.9 - 53.9 %   MCV 95.9 80.0 - 100.0 fL   MCH 30.9 26.0 - 34.0 pg   MCHC 32.2 30.0 - 36.0 g/dL   RDW 86.2 88.4 - 84.4 %   Platelets 345 150 - 400 K/uL   nRBC 0.0 0.0 - 0.2 %  Magnesium    Collection Time: 12/21/23  7:34 AM  Result Value Ref Range   Magnesium  1.9 1.7 - 2.4 mg/dL  Phosphorus   Collection Time: 12/21/23   7:34 AM  Result Value Ref Range   Phosphorus 3.1 2.5 - 4.6 mg/dL  Protime-INR   Collection Time: 12/21/23  7:34 AM  Result Value Ref Range   Prothrombin Time  13.6 11.4 - 15.2 seconds   INR 1.0 0.8 - 1.2  VITAMIN D  25 Hydroxy (Vit-D Deficiency, Fractures)   Collection Time: 12/21/23  7:34 AM  Result Value Ref Range   Vit D, 25-Hydroxy 33.27 30 - 100 ng/mL  Hepatic function panel   Collection Time: 12/21/23  9:00 AM  Result Value Ref Range   Total Protein 6.8 6.5 - 8.1 g/dL   Albumin  3.7 3.5 - 5.0 g/dL   AST 27 15 - 41 U/L   ALT 16 0 - 44 U/L   Alkaline Phosphatase 90 38 - 126 U/L   Total Bilirubin 0.6 0.0 - 1.2 mg/dL   Bilirubin, Direct <9.8 0.0 - 0.2 mg/dL   Indirect Bilirubin NOT CALCULATED 0.3 - 0.9 mg/dL  Surgical pcr screen   Collection Time: 12/21/23  9:34 AM   Specimen: Nasal Mucosa; Nasal Swab  Result Value Ref Range   MRSA, PCR NEGATIVE NEGATIVE   Staphylococcus aureus NEGATIVE NEGATIVE  I-STAT, chem 8   Collection Time: 12/21/23  2:11 PM  Result Value Ref Range   Sodium 137 135 - 145 mmol/L   Potassium 4.0 3.5 - 5.1 mmol/L   Chloride 99 98 - 111 mmol/L   BUN 11 8 - 23 mg/dL   Creatinine, Ser 9.19 0.44 - 1.00 mg/dL   Glucose, Bld 864 (H) 70 - 99 mg/dL   Calcium , Ion 1.17 1.15 - 1.40 mmol/L   TCO2 26 22 - 32 mmol/L   Hemoglobin 10.5 (L) 12.0 - 15.0 g/dL   HCT 68.9 (L) 63.9 - 53.9 %  CBC with Differential/Platelet   Collection Time: 12/22/23  9:54 AM  Result Value Ref Range   WBC 10.2 4.0 - 10.5 K/uL   RBC 2.49 (L) 3.87 - 5.11 MIL/uL   Hemoglobin 7.8 (L) 12.0 - 15.0 g/dL   HCT 75.2 (L) 63.9 - 53.9 %   MCV 99.2 80.0 - 100.0 fL   MCH 31.3 26.0 - 34.0 pg   MCHC 31.6 30.0 - 36.0 g/dL   RDW 86.1 88.4 - 84.4 %   Platelets 259 150 - 400 K/uL   nRBC 0.0 0.0 - 0.2 %   Neutrophils Relative % 72 %   Neutro Abs 7.3 1.7 - 7.7 K/uL   Lymphocytes Relative 16 %   Lymphs Abs 1.6 0.7 - 4.0 K/uL   Monocytes Relative 11 %   Monocytes Absolute 1.2 (H) 0.1 - 1.0 K/uL    Eosinophils Relative 0 %   Eosinophils Absolute 0.0 0.0 - 0.5 K/uL   Basophils Relative 0 %   Basophils Absolute 0.0 0.0 - 0.1 K/uL   Immature Granulocytes 1 %   Abs Immature Granulocytes 0.07 0.00 - 0.07 K/uL  Comprehensive metabolic panel   Collection Time: 12/22/23  9:54 AM  Result Value Ref Range   Sodium 130 (L) 135 - 145 mmol/L   Potassium 4.4 3.5 - 5.1 mmol/L   Chloride 96 (L) 98 - 111 mmol/L   CO2 23 22 - 32 mmol/L   Glucose, Bld 102 (H) 70 - 99 mg/dL   BUN 13 8 - 23 mg/dL   Creatinine, Ser 8.95 (H) 0.44 - 1.00 mg/dL   Calcium  8.2 (L) 8.9 - 10.3 mg/dL   Total Protein 5.7 (L) 6.5 - 8.1 g/dL   Albumin  2.8 (L) 3.5 - 5.0 g/dL   AST 41 15 - 41 U/L   ALT 15 0 - 44 U/L   Alkaline Phosphatase 64 38 - 126 U/L   Total Bilirubin 0.5  0.0 - 1.2 mg/dL   GFR, Estimated 55 (L) >60 mL/min   Anion gap 11 5 - 15  Magnesium    Collection Time: 12/22/23  9:54 AM  Result Value Ref Range   Magnesium  1.8 1.7 - 2.4 mg/dL  Phosphorus   Collection Time: 12/22/23  9:54 AM  Result Value Ref Range   Phosphorus 2.7 2.5 - 4.6 mg/dL  Comprehensive metabolic panel with GFR   Collection Time: 12/23/23 11:04 AM  Result Value Ref Range   Sodium 127 (L) 135 - 145 mmol/L   Potassium 4.0 3.5 - 5.1 mmol/L   Chloride 97 (L) 98 - 111 mmol/L   CO2 23 22 - 32 mmol/L   Glucose, Bld 132 (H) 70 - 99 mg/dL   BUN 15 8 - 23 mg/dL   Creatinine, Ser 9.06 0.44 - 1.00 mg/dL   Calcium  8.0 (L) 8.9 - 10.3 mg/dL   Total Protein 5.9 (L) 6.5 - 8.1 g/dL   Albumin  2.6 (L) 3.5 - 5.0 g/dL   AST 45 (H) 15 - 41 U/L   ALT 13 0 - 44 U/L   Alkaline Phosphatase 65 38 - 126 U/L   Total Bilirubin 0.5 0.0 - 1.2 mg/dL   GFR, Estimated >39 >39 mL/min   Anion gap 7 5 - 15  CBC with Differential/Platelet   Collection Time: 12/23/23 11:04 AM  Result Value Ref Range   WBC 8.3 4.0 - 10.5 K/uL   RBC 2.34 (L) 3.87 - 5.11 MIL/uL   Hemoglobin 7.3 (L) 12.0 - 15.0 g/dL   HCT 77.7 (L) 63.9 - 53.9 %   MCV 94.9 80.0 - 100.0 fL   MCH 31.2  26.0 - 34.0 pg   MCHC 32.9 30.0 - 36.0 g/dL   RDW 86.1 88.4 - 84.4 %   Platelets 262 150 - 400 K/uL   nRBC 0.0 0.0 - 0.2 %   Neutrophils Relative % 79 %   Neutro Abs 6.5 1.7 - 7.7 K/uL   Lymphocytes Relative 10 %   Lymphs Abs 0.8 0.7 - 4.0 K/uL   Monocytes Relative 9 %   Monocytes Absolute 0.8 0.1 - 1.0 K/uL   Eosinophils Relative 1 %   Eosinophils Absolute 0.1 0.0 - 0.5 K/uL   Basophils Relative 0 %   Basophils Absolute 0.0 0.0 - 0.1 K/uL   Immature Granulocytes 1 %   Abs Immature Granulocytes 0.05 0.00 - 0.07 K/uL  Phosphorus   Collection Time: 12/23/23 11:04 AM  Result Value Ref Range   Phosphorus 2.7 2.5 - 4.6 mg/dL  Magnesium    Collection Time: 12/23/23 11:04 AM  Result Value Ref Range   Magnesium  2.1 1.7 - 2.4 mg/dL  Vitamin B12   Collection Time: 12/23/23 11:04 AM  Result Value Ref Range   Vitamin B-12 336 180 - 914 pg/mL  Folate   Collection Time: 12/23/23 11:04 AM  Result Value Ref Range   Folate 13.5 >5.9 ng/mL  Iron and TIBC   Collection Time: 12/23/23 11:04 AM  Result Value Ref Range   Iron 14 (L) 28 - 170 ug/dL   TIBC 681 749 - 549 ug/dL   Saturation Ratios 4 (L) 10.4 - 31.8 %   UIBC 304 ug/dL  Ferritin   Collection Time: 12/23/23 11:04 AM  Result Value Ref Range   Ferritin 67 11 - 307 ng/mL  Reticulocytes   Collection Time: 12/23/23 11:04 AM  Result Value Ref Range   Retic Ct Pct 1.9 0.4 - 3.1 %   RBC.  2.36 (L) 3.87 - 5.11 MIL/uL   Retic Count, Absolute 44.1 19.0 - 186.0 K/uL   Immature Retic Fract 28.2 (H) 2.3 - 15.9 %  CBC with Differential/Platelet   Collection Time: 12/24/23  8:26 AM  Result Value Ref Range   WBC 10.2 4.0 - 10.5 K/uL   RBC 2.42 (L) 3.87 - 5.11 MIL/uL   Hemoglobin 7.4 (L) 12.0 - 15.0 g/dL   HCT 76.5 (L) 63.9 - 53.9 %   MCV 96.7 80.0 - 100.0 fL   MCH 30.6 26.0 - 34.0 pg   MCHC 31.6 30.0 - 36.0 g/dL   RDW 86.2 88.4 - 84.4 %   Platelets 310 150 - 400 K/uL   nRBC 0.0 0.0 - 0.2 %   Neutrophils Relative % 78 %   Neutro Abs  7.9 (H) 1.7 - 7.7 K/uL   Lymphocytes Relative 11 %   Lymphs Abs 1.1 0.7 - 4.0 K/uL   Monocytes Relative 9 %   Monocytes Absolute 0.9 0.1 - 1.0 K/uL   Eosinophils Relative 2 %   Eosinophils Absolute 0.2 0.0 - 0.5 K/uL   Basophils Relative 0 %   Basophils Absolute 0.0 0.0 - 0.1 K/uL   Immature Granulocytes 0 %   Abs Immature Granulocytes 0.04 0.00 - 0.07 K/uL  Comprehensive metabolic panel with GFR   Collection Time: 12/24/23  8:26 AM  Result Value Ref Range   Sodium 133 (L) 135 - 145 mmol/L   Potassium 4.1 3.5 - 5.1 mmol/L   Chloride 99 98 - 111 mmol/L   CO2 24 22 - 32 mmol/L   Glucose, Bld 131 (H) 70 - 99 mg/dL   BUN 8 8 - 23 mg/dL   Creatinine, Ser 9.27 0.44 - 1.00 mg/dL   Calcium  8.3 (L) 8.9 - 10.3 mg/dL   Total Protein 5.7 (L) 6.5 - 8.1 g/dL   Albumin  2.6 (L) 3.5 - 5.0 g/dL   AST 34 15 - 41 U/L   ALT 12 0 - 44 U/L   Alkaline Phosphatase 81 38 - 126 U/L   Total Bilirubin 0.8 0.0 - 1.2 mg/dL   GFR, Estimated >39 >39 mL/min   Anion gap 10 5 - 15  Magnesium    Collection Time: 12/24/23  8:26 AM  Result Value Ref Range   Magnesium  2.0 1.7 - 2.4 mg/dL  Phosphorus   Collection Time: 12/24/23  8:26 AM  Result Value Ref Range   Phosphorus 2.8 2.5 - 4.6 mg/dL  Comprehensive metabolic panel with GFR   Collection Time: 12/25/23  9:47 AM  Result Value Ref Range   Sodium 130 (L) 135 - 145 mmol/L   Potassium 4.1 3.5 - 5.1 mmol/L   Chloride 94 (L) 98 - 111 mmol/L   CO2 26 22 - 32 mmol/L   Glucose, Bld 107 (H) 70 - 99 mg/dL   BUN 7 (L) 8 - 23 mg/dL   Creatinine, Ser 9.21 0.44 - 1.00 mg/dL   Calcium  8.4 (L) 8.9 - 10.3 mg/dL   Total Protein 5.8 (L) 6.5 - 8.1 g/dL   Albumin  2.5 (L) 3.5 - 5.0 g/dL   AST 27 15 - 41 U/L   ALT 12 0 - 44 U/L   Alkaline Phosphatase 75 38 - 126 U/L   Total Bilirubin 0.7 0.0 - 1.2 mg/dL   GFR, Estimated >39 >39 mL/min   Anion gap 10 5 - 15  CBC with Differential/Platelet   Collection Time: 12/25/23  9:47 AM  Result Value Ref Range  WBC 9.3 4.0 -  10.5 K/uL   RBC 2.45 (L) 3.87 - 5.11 MIL/uL   Hemoglobin 7.7 (L) 12.0 - 15.0 g/dL   HCT 76.0 (L) 63.9 - 53.9 %   MCV 97.6 80.0 - 100.0 fL   MCH 31.4 26.0 - 34.0 pg   MCHC 32.2 30.0 - 36.0 g/dL   RDW 86.0 88.4 - 84.4 %   Platelets 321 150 - 400 K/uL   nRBC 0.0 0.0 - 0.2 %   Neutrophils Relative % 74 %   Neutro Abs 6.8 1.7 - 7.7 K/uL   Lymphocytes Relative 11 %   Lymphs Abs 1.0 0.7 - 4.0 K/uL   Monocytes Relative 10 %   Monocytes Absolute 0.9 0.1 - 1.0 K/uL   Eosinophils Relative 4 %   Eosinophils Absolute 0.4 0.0 - 0.5 K/uL   Basophils Relative 0 %   Basophils Absolute 0.0 0.0 - 0.1 K/uL   Immature Granulocytes 1 %   Abs Immature Granulocytes 0.06 0.00 - 0.07 K/uL  Phosphorus   Collection Time: 12/25/23  9:47 AM  Result Value Ref Range   Phosphorus 2.7 2.5 - 4.6 mg/dL  Magnesium    Collection Time: 12/25/23  9:47 AM  Result Value Ref Range   Magnesium  1.8 1.7 - 2.4 mg/dL  D-dimer, quantitative   Collection Time: 12/26/23  6:20 AM  Result Value Ref Range   D-Dimer, Quant 3.10 (H) 0.00 - 0.50 ug/mL-FEU  CBC with Differential/Platelet   Collection Time: 12/26/23  6:20 AM  Result Value Ref Range   WBC 9.5 4.0 - 10.5 K/uL   RBC 2.48 (L) 3.87 - 5.11 MIL/uL   Hemoglobin 7.7 (L) 12.0 - 15.0 g/dL   HCT 75.8 (L) 63.9 - 53.9 %   MCV 97.2 80.0 - 100.0 fL   MCH 31.0 26.0 - 34.0 pg   MCHC 32.0 30.0 - 36.0 g/dL   RDW 86.1 88.4 - 84.4 %   Platelets 359 150 - 400 K/uL   nRBC 0.0 0.0 - 0.2 %   Neutrophils Relative % 70 %   Neutro Abs 6.6 1.7 - 7.7 K/uL   Lymphocytes Relative 14 %   Lymphs Abs 1.3 0.7 - 4.0 K/uL   Monocytes Relative 11 %   Monocytes Absolute 1.1 (H) 0.1 - 1.0 K/uL   Eosinophils Relative 4 %   Eosinophils Absolute 0.4 0.0 - 0.5 K/uL   Basophils Relative 0 %   Basophils Absolute 0.0 0.0 - 0.1 K/uL   Immature Granulocytes 1 %   Abs Immature Granulocytes 0.10 (H) 0.00 - 0.07 K/uL  Comprehensive metabolic panel with GFR   Collection Time: 12/26/23  6:20 AM  Result  Value Ref Range   Sodium 133 (L) 135 - 145 mmol/L   Potassium 3.9 3.5 - 5.1 mmol/L   Chloride 95 (L) 98 - 111 mmol/L   CO2 27 22 - 32 mmol/L   Glucose, Bld 103 (H) 70 - 99 mg/dL   BUN 7 (L) 8 - 23 mg/dL   Creatinine, Ser 9.27 0.44 - 1.00 mg/dL   Calcium  8.7 (L) 8.9 - 10.3 mg/dL   Total Protein 5.8 (L) 6.5 - 8.1 g/dL   Albumin  2.5 (L) 3.5 - 5.0 g/dL   AST 25 15 - 41 U/L   ALT 13 0 - 44 U/L   Alkaline Phosphatase 72 38 - 126 U/L   Total Bilirubin 0.8 0.0 - 1.2 mg/dL   GFR, Estimated >39 >39 mL/min   Anion gap 11 5 - 15  Phosphorus   Collection Time: 12/26/23  6:20 AM  Result Value Ref Range   Phosphorus 3.3 2.5 - 4.6 mg/dL  Magnesium    Collection Time: 12/26/23  6:20 AM  Result Value Ref Range   Magnesium  2.1 1.7 - 2.4 mg/dL    Assessment and Plan  Hyponatremia Assessment & Plan: Due for reevaluation of electrolytes. Improved nutritional intake.  Orders: -     Comprehensive metabolic panel with GFR  Esophageal dysphagia Assessment & Plan: Chronic, per patient this is doing much better since she had the EGD/dilation in April.  She is not bothered by it and denies choking.  She refuses to do dysphagia 3 diet.  I encouraged her to chew her food is much as possible and eat upright, consider chin tuck when swallowing.   S/p left hip fracture Assessment & Plan: Acute, doing well.  Staples removed from left lower leg that were missed at orthopedic office visit.  Good approximation of wound with no redness or discharge.  Patient refused Steri-Strip application.  She is still at 50% weightbearing and I do not believe she is ready for physical therapy but states that she has a significant need for home health for her activities of daily living.  She has significant decreased in mobility.  She has no skilled nursing and needs. Referral placed for home health and for shower chair.  Orders: -     Comprehensive metabolic panel with GFR -     CBC with Differential/Platelet -      Magnesium  -     Ambulatory referral to Home Health -     For home use only DME Other see comment  Osteoporosis, unspecified osteoporosis type, unspecified pathological fracture presence Assessment & Plan: Chronic, worsened control off of Fosamax . Patient is willing at this point to do a bone density to assess extent of osteoporosis with plan of starting Prolia.  Orders: -     DG Bone Density -     Ambulatory referral to Home Health -     For home use only DME Other see comment  Essential hypertension, benign Assessment & Plan: Stable, chronic.  Continue current medication.  Metoprolol  50 mg 2 times daily      Muscular deconditioning Assessment & Plan: Once cleared she will need referral for home health physical therapy.  Will await orthopedic orders or okay.   Hypoxia Assessment & Plan: Noted in ED, now resolved at least at rest.  She denies shortness of breath or wheezing or cough. No clear need for albuterol  nebulizer at this time. No current concern for PE given no chest pain, no shortness of breath and no hypoxia. She is no longer using oxygen.     No follow-ups on file.   Greig Ring, MD

## 2024-01-16 NOTE — Assessment & Plan Note (Signed)
 Stable, chronic.  Continue current medication.  Metoprolol 50 mg 2 times daily

## 2024-01-16 NOTE — Assessment & Plan Note (Signed)
 Noted in ED, now resolved at least at rest.  She denies shortness of breath or wheezing or cough. No clear need for albuterol  nebulizer at this time. No current concern for PE given no chest pain, no shortness of breath and no hypoxia. She is no longer using oxygen.

## 2024-01-16 NOTE — Progress Notes (Signed)
 8 staples harvested from left lateral thigh toward knee.  Patient tolerated well.

## 2024-01-16 NOTE — Transitions of Care (Post Inpatient/ED Visit) (Unsigned)
   01/16/2024  Name: Tiffany [name] MRN: [id number] DOB: [birth year]  Today's TOC FU Call Status: Today's TOC FU Call Status:: Unsuccessful Call (1st Attempt) Unsuccessful Call (1st Attempt) Date: 01/16/24  Attempted to reach the patient regarding the most recent Inpatient/ED visit.  Follow Up Plan: Additional outreach attempts will be made to reach the patient to complete the Transitions of Care (Post Inpatient/ED visit) call.   Signature Julian Lemmings, LPN [facility] Nurse Health Advisor Direct Dial [phone]

## 2024-01-16 NOTE — Assessment & Plan Note (Signed)
 Once cleared she will need referral for home health physical therapy.  Will await orthopedic orders or okay.

## 2024-01-16 NOTE — Assessment & Plan Note (Signed)
 Due for reevaluation of electrolytes. Improved nutritional intake.

## 2024-01-16 NOTE — Assessment & Plan Note (Signed)
 Chronic, per patient this is doing much better since she had the EGD/dilation in April.  She is not bothered by it and denies choking.  She refuses to do dysphagia 3 diet.  I encouraged her to chew her food is much as possible and eat upright, consider chin tuck when swallowing.

## 2024-01-17 ENCOUNTER — Ambulatory Visit: Payer: Self-pay | Admitting: Family Medicine

## 2024-01-17 LAB — CBC WITH DIFFERENTIAL/PLATELET
Basophils Absolute: 0 K/uL (ref 0.0–0.1)
Basophils Relative: 0.6 % (ref 0.0–3.0)
Eosinophils Absolute: 0.1 K/uL (ref 0.0–0.7)
Eosinophils Relative: 1.3 % (ref 0.0–5.0)
HCT: 29.3 % — ABNORMAL LOW (ref 36.0–46.0)
Hemoglobin: 9.5 g/dL — ABNORMAL LOW (ref 12.0–15.0)
Lymphocytes Relative: 16.9 % (ref 12.0–46.0)
Lymphs Abs: 1.3 K/uL (ref 0.7–4.0)
MCHC: 32.3 g/dL (ref 30.0–36.0)
MCV: 92.5 fl (ref 78.0–100.0)
Monocytes Absolute: 0.7 K/uL (ref 0.1–1.0)
Monocytes Relative: 9.6 % (ref 3.0–12.0)
Neutro Abs: 5.6 K/uL (ref 1.4–7.7)
Neutrophils Relative %: 71.6 % (ref 43.0–77.0)
Platelets: 427 K/uL — ABNORMAL HIGH (ref 150.0–400.0)
RBC: 3.17 Mil/uL — ABNORMAL LOW (ref 3.87–5.11)
RDW: 14.9 % (ref 11.5–15.5)
WBC: 7.8 K/uL (ref 4.0–10.5)

## 2024-01-17 LAB — COMPREHENSIVE METABOLIC PANEL WITH GFR
ALT: 8 U/L (ref 0–35)
AST: 14 U/L (ref 0–37)
Albumin: 3.7 g/dL (ref 3.5–5.2)
Alkaline Phosphatase: 153 U/L — ABNORMAL HIGH (ref 39–117)
BUN: 8 mg/dL (ref 6–23)
CO2: 30 meq/L (ref 19–32)
Calcium: 9.1 mg/dL (ref 8.4–10.5)
Chloride: 98 meq/L (ref 96–112)
Creatinine, Ser: 0.75 mg/dL (ref 0.40–1.20)
GFR: 75.52 mL/min (ref >60.00)
Glucose, Bld: 105 mg/dL — ABNORMAL HIGH (ref 70–99)
Potassium: 3.9 meq/L (ref 3.5–5.1)
Sodium: 136 meq/L (ref 135–145)
Total Bilirubin: 0.5 mg/dL (ref 0.2–1.2)
Total Protein: 6.9 g/dL (ref 6.0–8.3)

## 2024-01-17 LAB — MAGNESIUM: Magnesium: 1.9 mg/dL (ref 1.5–2.5)

## 2024-01-17 NOTE — Transitions of Care (Post Inpatient/ED Visit) (Signed)
   01/17/2024  Name: Tiffany [name] MRN: [id number] DOB: 1946/02/13  Today's TOC FU Call Status: Today's TOC FU Call Status:: Unsuccessful Call (1st Attempt) Unsuccessful Call (1st Attempt) Date: 01/16/24  Attempted to reach the patient regarding the most recent Inpatient/ED visit.  Follow Up Plan: No further outreach attempts will be made at this time. We have been unable to contact the patient. Patient already seen in office Signature Julian Lemmings, LPN [facility] Nurse Health Advisor Direct Dial [phone]

## 2024-01-18 ENCOUNTER — Telehealth: Payer: Self-pay

## 2024-01-18 ENCOUNTER — Other Ambulatory Visit: Payer: Self-pay

## 2024-01-18 NOTE — Telephone Encounter (Signed)
 Copied from CRM [id number]. Topic: Clinical - Prescription Issue >> Jan 18, 2024 10:30 AM Tonda B wrote: Reason for CRM: patient is calling in saying that she received her albuterol  rx but she do not have a machine to put the rx in please call patient back [phone]

## 2024-01-18 NOTE — Telephone Encounter (Signed)
 I discussed this with the patient at the recent office visit for hospitalization follow-up.  She had no shortness of breath or wheezing at that time and hypoxia had resolved so I do not think she needs to use albuterol  and therefore does not need a nebulizer.  If she feels differently today please let me know and we can place a DME order for the nebulizer.

## 2024-01-18 NOTE — Telephone Encounter (Signed)
 Tiffany [name] notified as instructed by telephone.  She agrees that she doesn't need the nebulizer at this time.  She will call us  back if things changes.

## 2024-01-18 NOTE — Telephone Encounter (Signed)
 Medication was ordered by:   Ordering and Authorizing Provider: Honora Search, FNP-BC [address] [place], [place] [address] Phone: [phone]   Fax: [phone] DEA #: [id number]   NPI: [id number]   Please advise.  Okay to put in DME order for Nebulizer machine.

## 2024-01-23 DIAGNOSIS — I700 Atherosclerosis of aorta: Secondary | ICD-10-CM | POA: Diagnosis not present

## 2024-01-23 DIAGNOSIS — K222 Esophageal obstruction: Secondary | ICD-10-CM | POA: Diagnosis not present

## 2024-01-23 DIAGNOSIS — Z9181 History of falling: Secondary | ICD-10-CM | POA: Diagnosis not present

## 2024-01-23 DIAGNOSIS — K2970 Gastritis, unspecified, without bleeding: Secondary | ICD-10-CM | POA: Diagnosis not present

## 2024-01-23 DIAGNOSIS — M810 Age-related osteoporosis without current pathological fracture: Secondary | ICD-10-CM | POA: Diagnosis not present

## 2024-01-23 DIAGNOSIS — I10 Essential (primary) hypertension: Secondary | ICD-10-CM | POA: Diagnosis not present

## 2024-01-23 DIAGNOSIS — S72142D Displaced intertrochanteric fracture of left femur, subsequent encounter for closed fracture with routine healing: Secondary | ICD-10-CM | POA: Diagnosis not present

## 2024-01-23 DIAGNOSIS — Z7901 Long term (current) use of anticoagulants: Secondary | ICD-10-CM | POA: Diagnosis not present

## 2024-01-23 DIAGNOSIS — E8809 Other disorders of plasma-protein metabolism, not elsewhere classified: Secondary | ICD-10-CM | POA: Diagnosis not present

## 2024-01-23 DIAGNOSIS — J9601 Acute respiratory failure with hypoxia: Secondary | ICD-10-CM | POA: Diagnosis not present

## 2024-01-23 DIAGNOSIS — R1312 Dysphagia, oropharyngeal phase: Secondary | ICD-10-CM | POA: Diagnosis not present

## 2024-01-23 DIAGNOSIS — D649 Anemia, unspecified: Secondary | ICD-10-CM | POA: Diagnosis not present

## 2024-01-23 DIAGNOSIS — E785 Hyperlipidemia, unspecified: Secondary | ICD-10-CM | POA: Diagnosis not present

## 2024-01-23 DIAGNOSIS — G4700 Insomnia, unspecified: Secondary | ICD-10-CM | POA: Diagnosis not present

## 2024-01-23 DIAGNOSIS — M80052D Age-related osteoporosis with current pathological fracture, left femur, subsequent encounter for fracture with routine healing: Secondary | ICD-10-CM | POA: Diagnosis not present

## 2024-01-23 DIAGNOSIS — K2100 Gastro-esophageal reflux disease with esophagitis, without bleeding: Secondary | ICD-10-CM | POA: Diagnosis not present

## 2024-01-23 DIAGNOSIS — M5030 Other cervical disc degeneration, unspecified cervical region: Secondary | ICD-10-CM | POA: Diagnosis not present

## 2024-01-24 ENCOUNTER — Other Ambulatory Visit (HOSPITAL_COMMUNITY): Payer: Self-pay

## 2024-01-24 ENCOUNTER — Telehealth: Payer: Self-pay | Admitting: Family Medicine

## 2024-01-24 DIAGNOSIS — M47816 Spondylosis without myelopathy or radiculopathy, lumbar region: Secondary | ICD-10-CM | POA: Diagnosis not present

## 2024-01-24 DIAGNOSIS — M62830 Muscle spasm of back: Secondary | ICD-10-CM | POA: Diagnosis not present

## 2024-01-24 DIAGNOSIS — M48062 Spinal stenosis, lumbar region with neurogenic claudication: Secondary | ICD-10-CM | POA: Diagnosis not present

## 2024-01-24 DIAGNOSIS — G894 Chronic pain syndrome: Secondary | ICD-10-CM | POA: Diagnosis not present

## 2024-01-24 MED ORDER — METHOCARBAMOL 500 MG PO TABS
500.0000 mg | ORAL_TABLET | Freq: Three times a day (TID) | ORAL | 2 refills | Status: AC | PRN
  Filled 2024-01-24: qty 90, 30d supply, fill #0
  Filled 2024-02-22: qty 90, 30d supply, fill #1
  Filled 2024-03-13 – 2024-04-16 (×2): qty 90, 30d supply, fill #2

## 2024-01-24 MED ORDER — OXYCODONE HCL 5 MG PO TABS
5.0000 mg | ORAL_TABLET | Freq: Three times a day (TID) | ORAL | 0 refills | Status: DC | PRN
  Filled 2024-01-24 (×2): qty 90, 30d supply, fill #0

## 2024-01-24 MED ORDER — OXYCODONE HCL 5 MG PO TABS: 5.0000 mg | ORAL_TABLET | Freq: Three times a day (TID) | ORAL | 0 refills | Status: DC | PRN

## 2024-01-24 MED ORDER — BUSPIRONE HCL 5 MG PO TABS
5.0000 mg | ORAL_TABLET | Freq: Three times a day (TID) | ORAL | 1 refills | Status: DC | PRN
  Filled 2024-01-24 (×2): qty 90, 30d supply, fill #0

## 2024-01-24 MED ORDER — PAROXETINE HCL 40 MG PO TABS
20.0000 mg | ORAL_TABLET | Freq: Every day | ORAL | 3 refills | Status: DC
  Filled 2024-01-24: qty 30, 30d supply, fill #0

## 2024-01-24 NOTE — Telephone Encounter (Signed)
 Called [facility] at [phone]. I spoke with Dana  and gave her verbal orders for Skilled nursing 1 x week for 3 weeks, then 1x every other week x 5 weeks, and 1 PRN.  Phone number provided for Annabella is not a working number.

## 2024-01-24 NOTE — Telephone Encounter (Signed)
 Noted

## 2024-01-24 NOTE — Telephone Encounter (Signed)
 Copied from CRM [id number]. Topic: Clinical - Home Health Verbal Orders >> Jan 23, 2024  5:06 PM Maisie BROCKS wrote: Caller/Agency: [facility] Callback Number: [phone] Service Requested: Skilled Nursing Frequency: 1 week 3, 1 every other week, 5, and 1 PRN Any new concerns about the patient? No

## 2024-01-25 ENCOUNTER — Other Ambulatory Visit (HOSPITAL_COMMUNITY): Payer: Self-pay

## 2024-01-25 ENCOUNTER — Other Ambulatory Visit (HOSPITAL_BASED_OUTPATIENT_CLINIC_OR_DEPARTMENT_OTHER): Payer: Self-pay

## 2024-01-25 DIAGNOSIS — R1312 Dysphagia, oropharyngeal phase: Secondary | ICD-10-CM | POA: Diagnosis not present

## 2024-01-25 DIAGNOSIS — D649 Anemia, unspecified: Secondary | ICD-10-CM | POA: Diagnosis not present

## 2024-01-25 DIAGNOSIS — M80052D Age-related osteoporosis with current pathological fracture, left femur, subsequent encounter for fracture with routine healing: Secondary | ICD-10-CM | POA: Diagnosis not present

## 2024-01-25 DIAGNOSIS — K2100 Gastro-esophageal reflux disease with esophagitis, without bleeding: Secondary | ICD-10-CM | POA: Diagnosis not present

## 2024-01-25 DIAGNOSIS — S72142D Displaced intertrochanteric fracture of left femur, subsequent encounter for closed fracture with routine healing: Secondary | ICD-10-CM | POA: Diagnosis not present

## 2024-01-25 DIAGNOSIS — J9601 Acute respiratory failure with hypoxia: Secondary | ICD-10-CM | POA: Diagnosis not present

## 2024-01-25 DIAGNOSIS — Z7901 Long term (current) use of anticoagulants: Secondary | ICD-10-CM | POA: Diagnosis not present

## 2024-01-25 DIAGNOSIS — E785 Hyperlipidemia, unspecified: Secondary | ICD-10-CM | POA: Diagnosis not present

## 2024-01-25 DIAGNOSIS — Z9181 History of falling: Secondary | ICD-10-CM | POA: Diagnosis not present

## 2024-01-25 DIAGNOSIS — G4700 Insomnia, unspecified: Secondary | ICD-10-CM | POA: Diagnosis not present

## 2024-01-25 DIAGNOSIS — K2970 Gastritis, unspecified, without bleeding: Secondary | ICD-10-CM | POA: Diagnosis not present

## 2024-01-25 DIAGNOSIS — K222 Esophageal obstruction: Secondary | ICD-10-CM | POA: Diagnosis not present

## 2024-01-25 DIAGNOSIS — I10 Essential (primary) hypertension: Secondary | ICD-10-CM | POA: Diagnosis not present

## 2024-01-26 ENCOUNTER — Telehealth: Payer: Self-pay

## 2024-01-26 NOTE — Telephone Encounter (Signed)
 Copied from CRM #[id number]. Topic: Clinical - Home Health Verbal Orders >> Jan 26, 2024  3:12 PM Franky GRADE wrote: Caller/Agency: [facility] Callback Number: [phone] Service Requested: Physical Therapy Frequency: 1 time a week for 1 week, 3 times a week for 4 weeks, 1 time a week 4 weeks Any new concerns about the patient? No, but requesting a walker for the patient >> Jan 26, 2024  3:30 PM CMA Lila C wrote: Wrong office

## 2024-01-26 NOTE — Telephone Encounter (Signed)
 Returned Tiffany [name]. Voicemail in not identifiable so I ask that she return call to office for orders. Also need to know what type of walker she is requesting for patient.

## 2024-01-26 NOTE — Telephone Encounter (Signed)
 Noted

## 2024-01-29 ENCOUNTER — Ambulatory Visit: Attending: Cardiology | Admitting: Cardiology

## 2024-01-29 ENCOUNTER — Telehealth: Payer: Self-pay

## 2024-01-29 ENCOUNTER — Encounter: Payer: Self-pay | Admitting: Cardiology

## 2024-01-29 VITALS — BP 108/62 | HR 88 | Ht 62.75 in | Wt 128.4 lb

## 2024-01-29 DIAGNOSIS — I059 Rheumatic mitral valve disease, unspecified: Secondary | ICD-10-CM

## 2024-01-29 DIAGNOSIS — Z862 Personal history of diseases of the blood and blood-forming organs and certain disorders involving the immune mechanism: Secondary | ICD-10-CM

## 2024-01-29 DIAGNOSIS — Z7901 Long term (current) use of anticoagulants: Secondary | ICD-10-CM | POA: Diagnosis not present

## 2024-01-29 DIAGNOSIS — E785 Hyperlipidemia, unspecified: Secondary | ICD-10-CM

## 2024-01-29 DIAGNOSIS — R609 Edema, unspecified: Secondary | ICD-10-CM | POA: Diagnosis not present

## 2024-01-29 DIAGNOSIS — I10 Essential (primary) hypertension: Secondary | ICD-10-CM

## 2024-01-29 DIAGNOSIS — M80052D Age-related osteoporosis with current pathological fracture, left femur, subsequent encounter for fracture with routine healing: Secondary | ICD-10-CM | POA: Diagnosis not present

## 2024-01-29 DIAGNOSIS — K2970 Gastritis, unspecified, without bleeding: Secondary | ICD-10-CM | POA: Diagnosis not present

## 2024-01-29 DIAGNOSIS — S72142D Displaced intertrochanteric fracture of left femur, subsequent encounter for closed fracture with routine healing: Secondary | ICD-10-CM | POA: Diagnosis not present

## 2024-01-29 DIAGNOSIS — R296 Repeated falls: Secondary | ICD-10-CM

## 2024-01-29 DIAGNOSIS — R1312 Dysphagia, oropharyngeal phase: Secondary | ICD-10-CM | POA: Diagnosis not present

## 2024-01-29 DIAGNOSIS — K2100 Gastro-esophageal reflux disease with esophagitis, without bleeding: Secondary | ICD-10-CM | POA: Diagnosis not present

## 2024-01-29 DIAGNOSIS — K222 Esophageal obstruction: Secondary | ICD-10-CM | POA: Diagnosis not present

## 2024-01-29 DIAGNOSIS — I700 Atherosclerosis of aorta: Secondary | ICD-10-CM

## 2024-01-29 DIAGNOSIS — G4700 Insomnia, unspecified: Secondary | ICD-10-CM | POA: Diagnosis not present

## 2024-01-29 DIAGNOSIS — Z9181 History of falling: Secondary | ICD-10-CM | POA: Diagnosis not present

## 2024-01-29 DIAGNOSIS — R29898 Other symptoms and signs involving the musculoskeletal system: Secondary | ICD-10-CM

## 2024-01-29 DIAGNOSIS — I493 Ventricular premature depolarization: Secondary | ICD-10-CM

## 2024-01-29 DIAGNOSIS — Z8781 Personal history of (healed) traumatic fracture: Secondary | ICD-10-CM | POA: Diagnosis not present

## 2024-01-29 DIAGNOSIS — E8809 Other disorders of plasma-protein metabolism, not elsewhere classified: Secondary | ICD-10-CM | POA: Diagnosis not present

## 2024-01-29 DIAGNOSIS — J9601 Acute respiratory failure with hypoxia: Secondary | ICD-10-CM | POA: Diagnosis not present

## 2024-01-29 DIAGNOSIS — D649 Anemia, unspecified: Secondary | ICD-10-CM | POA: Diagnosis not present

## 2024-01-29 NOTE — Telephone Encounter (Signed)
 Copied from CRM #[id number]. Topic: Clinical - Home Health Verbal Orders >> Jan 26, 2024  3:12 PM Franky GRADE wrote: Caller/Agency: [facility] Callback Number: [phone] Service Requested: Physical Therapy Frequency: 1 time a week for 1 week, 3 times a week for 4 weeks, 1 time a week 4 weeks Any new concerns about the patient? No, but requesting a walker for the patient

## 2024-01-29 NOTE — Addendum Note (Signed)
 Addended by: WENDELL ARLAND RAMAN on: 01/29/2024 12:27 PM   Modules accepted: Orders

## 2024-01-29 NOTE — Telephone Encounter (Signed)
 Left message for Tiffany [name] to return call to office.  Unable to leave orders on voicemail because it is not identifiable. I did go ahead and send DME order for walker to [facility].  Community message sent to [facility] alerting them of the order.

## 2024-01-29 NOTE — Telephone Encounter (Signed)
 Per note from [facility]:  This patient received a WC on 01/23/2024. Insurance will not cover another ambulatory aid at this time. We offered private pay which the PT declined.

## 2024-01-29 NOTE — Progress Notes (Signed)
 Cardiology Office Note:  .   Date:  02/03/2024  ID:  [name], DOB 1946-02-27, MRN [id number] PCP: Tiffany Greig BRAVO, MD  [facility] Providers Cardiologist:  Alm Clay, MD     Chief Complaint  Patient presents with   Follow-up    Stable cardiac symptoms.    Patient Profile: .     Tiffany [name] is a 78 y.o. female with a PMH notable for HTN, HLD, [name] phenomenon, Aortic and Coronary Artery Calcification on CT Scan who presents here for annual follow-up at the request of Tiffany, Amy E, MD.  She was evaluated with cardiac catheterization 2002 that showed normal coronaries.  Follow-up stress test in 2013 was nonischemic.  Echocardiogram 2012 was normal with no evidence of mitral prolapse despite MVP suggested on cardiac cath LV gram.  She has had issues with palpitations been treated with beta-blocker that were recently discontinued.  She is on calcium  channel blocker for [name].     Tiffany [name] was last seen on January 13, 2023 by Lamarr Satterfield, NP as a 1 month follow-up.  She was suffering multiple falls and just had a displaced fracture of the coracoid left shoulder.  She was noted to be bradycardic and hypotensive during the visit and metoprolol  was discontinued.  In this follow-up visit, her blood pressure was doing much better usually in 120s to 130s.  Heart rates were in the 60s to 70s.  She still has some occasional low blood pressure readings but not noticed significant symptoms.  I decided to stay off metoprolol  and discussed potentially doing physical therapy for stability.  Recommended walker.  Subjective  Discussed the use of AI scribe software for clinical note transcription with the patient, who gave verbal consent to proceed.  History of Present Illness  Tiffany [name] is a 78 year old female with hypertension and hyperlipidemia who presents with recent falls and a hip fracture.  She has experienced falls over the past six to  eight months, with the most recent fall occurring in the bathroom, resulting in a face-down fall. There is no dizziness or loss of consciousness during these falls, but she does not recall the events immediately after falling. She underwent surgery for an intertrochanteric fracture of the left hip following her most recent fall in July.  Her medical history includes hypertension and hyperlipidemia. She is currently on a low dose of amlodipine  for blood pressure, atorvastatin  10 mg for cholesterol, and Eliquis  due to her recent hip surgery. She was previously on metoprolol , which was discontinued by her nurse practitioner. For pain management, she takes oxycodone  three times a day and uses Robaxin  in between doses. To prevent constipation, she is on Senokot.  She has a history of aortic atherosclerosis, identified through a CT scan of her abdomen and pelvis in March. Additionally, she has a long-standing history of esophageal narrowing.  No heart racing, skipping, palpitations, shortness of breath when lying flat, waking up short of breath, chest pain, or pass out spells. Postoperatively, her oxygen levels were low in the hospital. She reports swelling in her left foot, which is the same side as her hip fracture. The swelling is not associated with significant pain. She is able to sleep through the night, waking only once to use the bathroom, and does not take any medications during the night.     Objective   Medications - Amlodipine  2.5 mg daily-[name] - Atorvastatin  10 mg daily - Eliquis  2.5 mg twice daily (DVT  prophylaxis) - Paxil  40 mg daily; BuSpar  5 mg as needed anxiety - Oxycodone  5 mg every 8 hours as needed pain; - Robaxin  500 mg every 6 hours as needed; - Senokot 8.6 and 50 mg 2 tabs daily; as needed MiraLAX  17 g - As needed albuterol   Studies Reviewed: SABRA   EKG Interpretation Date/Time:  Monday January 29 2024 14:42:14 EDT Ventricular Rate:  90 PR Interval:  180 QRS  Duration:  112 QT Interval:  410 QTC Calculation: 501 R Axis:   -62  Text Interpretation: Sinus rhythm with frequent Premature ventricular complexes Left axis deviation Inferior infarct , age undetermined When compared with ECG of 13-Dec-2022 08:49, Inferior infarct is now Present Confirmed by Anner Lenis ([id number]) on 01/29/2024 3:26:02 PM    Results Results LABS LDL: 71 (07/2023) Total cholesterol: 161 (07/2023) HDL: 70 (07/2023) Triglycerides: 101 (07/2023) Creatinine: 0.75 (01/16/2024) Hemoglobin: 9.5 (01/16/2024)  RADIOLOGY Abdomen and pelvis CT: Aortic atherosclerosis, no aneurysm (08/2023)  Cardiology Studies Transthoracic Echocardiogram [facility]): LVEF 60 to 65%.  GR 1 DD with suggestion of elevated LVEDP.  Mild MAC with trace MR.  No MVP.  Trace PRN TR.  Normal RSVP.  (01/2011). Myoview [facility] Heart Vascular): Normal LV size and function-EF 60 to 65%.  PVCs and couplets noted.  Low RISK (03/2012)  Cardiac Cath: Normal coronaries.  Normal left ventricular function with suggestion of possible MVP..  (11/05/2000)   Risk Assessment/Calculations:         Physical Exam:   VS:  BP 108/62   Pulse 88   Ht 5' 2.75 (1.594 m)   Wt 128 lb 6.4 oz (58.2 kg)   SpO2 93%   BMI 22.93 kg/m    Wt Readings from Last 3 Encounters:  01/29/24 128 lb 6.4 oz (58.2 kg)  01/16/24 130 lb 8 oz (59.2 kg)  12/21/23 129 lb 13.6 oz (58.9 kg)    GEN: Well nourished, well developed in no acute distress; healthy-appearing.  Well-groomed. NECK: No JVD; No carotid bruits CARDIAC: Normal S1, S2; RRR, no murmurs, rubs, gallops RESPIRATORY:  Clear to auscultation without rales, wheezing or rhonchi ; nonlabored, good air movement. ABDOMEN: Soft, non-tender, non-distended EXTREMITIES:  No edema; No deformity      ASSESSMENT AND PLAN: .    Problem List Items Addressed This Visit       Cardiology Problems   Aortic atherosclerosis (HCC) (Chronic)   On statin and  Eliquis  for risk factor reduction. CT showed aortic atherosclerosis without aneurysm      Frequent unifocal PVCs (Chronic)   Well-controlled now off of beta-blocker.  Continue to monitor.      Hyperlipidemia LDL goal <70 (Chronic)   With aortic and coronary calcification, targeting LDL<70. Most recent labs showed LDL 71.  Premature at goal on combination of 10 mg atorvastatin . - Continue current dose of 10 mg atorvastatin       Mitral valve disorder-inaccurate (Chronic)   Suggestions on cardiac catheterization with LV gram in 2002, but not noted on echocardiogram.      Primary hypertension - Primary (Chronic)   Remains off of metoprolol  since last visit.   Blood pressure well-controlled. Amlodipine  used for [name].  Metoprolol  discontinued last year without worsening palpitations. - Continue current dose of amlodipine  2.5 mg daily.      Relevant Orders   EKG 12-Lead (Completed)     Other   History of anemia   Anemia, postoperative Postoperative anemia likely from surgical blood loss. Hemoglobin at 9.5. - Monitor  hemoglobin levels.      Multiple falls   Multiple falls likely due to balance issues, not cardiac causes with no sense of irregular heartbeats or palpitations.  No true syncope..      S/p left hip fracture   Intertrochanteric fracture of left hip, post-surgical Status post-surgical repair with pain managed by oxycodone  and Robaxin . Swelling due to poor venous drainage. - Continue oxycodone  as needed, up to three times daily. - Use Robaxin  between oxycodone  doses. - Monitor for constipation, continue Senokot as needed.      Swelling (Chronic)   Mostly left leg swelling.  Nothing to suggest DVT.  Likely related to poor venous drainage after hip surgery.              Follow-Up: Return in about 1 year (around 01/28/2025) for 1 Yr Follow-up, Alternating annual follow-ups APP and MD.     Signed, Alm MICAEL Clay, MD, MS Alm Clay, M.D.,  M.S. Interventional Chartered certified accountant  Pager # [phone]

## 2024-01-29 NOTE — Patient Instructions (Signed)
 Medication Instructions:   No changes *If you need a refill on your cardiac medications before your next appointment, please call your pharmacy*   Lab Work: Not needed    Testing/Procedures: Not needed  Follow-Up: At [facility], you and your health needs are our priority.  As part of our continuing mission to provide you with exceptional heart care, we have created designated Provider Care Teams.  These Care Teams include your primary Cardiologist (physician) and Advanced Practice Providers (APPs -  Physician Assistants and Nurse Practitioners) who all work together to provide you with the care you need, when you need it.     Your next appointment:   12 month(s)  The format for your next appointment:   In Person  Provider:   Aline Door, PA-C, Damien Braver, NP, or Katlyn West, NP     .   Other Instructions

## 2024-01-29 NOTE — Telephone Encounter (Signed)
 Left message Tiffany [name] to retun call to office.   Unable to leave order due to voicemail is not identifiable.

## 2024-01-29 NOTE — Telephone Encounter (Signed)
 Left voicemail for Clinic Director at [facility] giving her verbal orders for OT 1 x week for 4 weeks.

## 2024-01-29 NOTE — Telephone Encounter (Signed)
 Left voicemail for Clinical Director at [facility] giving her verbal orders for PT 1 x week for 1 week, then 3 times a week for 4 weeks, then 1 x week for 4 weeks.  I also let her know that since patient recently received a wheelchair her insurance will not cover a walker at this time.  I ask that she call me back if she has any questions.  I also made her aware that we are not able to leave verbal orders on voicemail of the staff calling due to their voicemail's are not identifiable their name is not mentioned in voicemail.

## 2024-01-29 NOTE — Telephone Encounter (Signed)
 Copied from CRM #[id number]. Topic: Clinical - Home Health Verbal Orders >> Jan 29, 2024  2:28 PM Laymon HERO wrote: Caller/Agency: [facility] Callback Number: [phone] Service Requested: Occupational Therapy Frequency: 1x 4 Any new concerns about the patient? No

## 2024-01-30 ENCOUNTER — Telehealth: Payer: Self-pay | Admitting: Family Medicine

## 2024-01-30 NOTE — Telephone Encounter (Signed)
 Copied from CRM [id number]. Topic: General - Other >> Jan 30, 2024  1:15 PM Suzen RAMAN wrote: Reason for CRM: Darina from [facility] would like to inform provider that  visit was missed today 01/30/24. Patient didn't come to the door.  CB#[phone] with additional questions/concerns

## 2024-01-30 NOTE — Addendum Note (Signed)
 Addended by: AVELINA NO E on: 01/30/2024 06:02 PM   Modules accepted: Orders

## 2024-01-30 NOTE — Telephone Encounter (Signed)
 Noted

## 2024-01-31 DIAGNOSIS — D649 Anemia, unspecified: Secondary | ICD-10-CM | POA: Diagnosis not present

## 2024-02-02 ENCOUNTER — Other Ambulatory Visit: Payer: Self-pay

## 2024-02-03 ENCOUNTER — Encounter: Payer: Self-pay | Admitting: Cardiology

## 2024-02-03 DIAGNOSIS — R296 Repeated falls: Secondary | ICD-10-CM | POA: Insufficient documentation

## 2024-02-03 NOTE — Assessment & Plan Note (Signed)
 Anemia, postoperative Postoperative anemia likely from surgical blood loss. Hemoglobin at 9.5. - Monitor hemoglobin levels.

## 2024-02-03 NOTE — Assessment & Plan Note (Addendum)
 On statin and Eliquis  for risk factor reduction. CT showed aortic atherosclerosis without aneurysm

## 2024-02-03 NOTE — Assessment & Plan Note (Signed)
 Remains off of metoprolol  since last visit.   Blood pressure well-controlled. Amlodipine  used for [name].  Metoprolol  discontinued last year without worsening palpitations. - Continue current dose of amlodipine  2.5 mg daily.

## 2024-02-03 NOTE — Assessment & Plan Note (Signed)
 Suggestions on cardiac catheterization with LV gram in 2002, but not noted on echocardiogram.

## 2024-02-03 NOTE — Assessment & Plan Note (Signed)
 Multiple falls likely due to balance issues, not cardiac causes with no sense of irregular heartbeats or palpitations.  No true syncope.Tiffany [name]

## 2024-02-03 NOTE — Assessment & Plan Note (Signed)
 Intertrochanteric fracture of left hip, post-surgical Status post-surgical repair with pain managed by oxycodone  and Robaxin . Swelling due to poor venous drainage. - Continue oxycodone  as needed, up to three times daily. - Use Robaxin  between oxycodone  doses. - Monitor for constipation, continue Senokot as needed.

## 2024-02-03 NOTE — Assessment & Plan Note (Signed)
 With aortic and coronary calcification, targeting LDL<70. Most recent labs showed LDL 71.  Premature at goal on combination of 10 mg atorvastatin . - Continue current dose of 10 mg atorvastatin 

## 2024-02-03 NOTE — Assessment & Plan Note (Signed)
 Well-controlled now off of beta-blocker.  Continue to monitor.

## 2024-02-03 NOTE — Assessment & Plan Note (Signed)
 Mostly left leg swelling.  Nothing to suggest DVT.  Likely related to poor venous drainage after hip surgery.

## 2024-02-05 DIAGNOSIS — D649 Anemia, unspecified: Secondary | ICD-10-CM | POA: Diagnosis not present

## 2024-02-05 DIAGNOSIS — Z7901 Long term (current) use of anticoagulants: Secondary | ICD-10-CM | POA: Diagnosis not present

## 2024-02-05 DIAGNOSIS — S72142D Displaced intertrochanteric fracture of left femur, subsequent encounter for closed fracture with routine healing: Secondary | ICD-10-CM | POA: Diagnosis not present

## 2024-02-05 DIAGNOSIS — K222 Esophageal obstruction: Secondary | ICD-10-CM | POA: Diagnosis not present

## 2024-02-05 DIAGNOSIS — J9601 Acute respiratory failure with hypoxia: Secondary | ICD-10-CM | POA: Diagnosis not present

## 2024-02-05 DIAGNOSIS — E8809 Other disorders of plasma-protein metabolism, not elsewhere classified: Secondary | ICD-10-CM | POA: Diagnosis not present

## 2024-02-05 DIAGNOSIS — K2970 Gastritis, unspecified, without bleeding: Secondary | ICD-10-CM | POA: Diagnosis not present

## 2024-02-05 DIAGNOSIS — Z9181 History of falling: Secondary | ICD-10-CM | POA: Diagnosis not present

## 2024-02-05 DIAGNOSIS — I10 Essential (primary) hypertension: Secondary | ICD-10-CM | POA: Diagnosis not present

## 2024-02-05 DIAGNOSIS — G4700 Insomnia, unspecified: Secondary | ICD-10-CM | POA: Diagnosis not present

## 2024-02-05 DIAGNOSIS — E785 Hyperlipidemia, unspecified: Secondary | ICD-10-CM | POA: Diagnosis not present

## 2024-02-05 DIAGNOSIS — K2100 Gastro-esophageal reflux disease with esophagitis, without bleeding: Secondary | ICD-10-CM | POA: Diagnosis not present

## 2024-02-05 DIAGNOSIS — R1312 Dysphagia, oropharyngeal phase: Secondary | ICD-10-CM | POA: Diagnosis not present

## 2024-02-07 ENCOUNTER — Other Ambulatory Visit: Payer: Self-pay

## 2024-02-07 ENCOUNTER — Encounter: Payer: Self-pay | Admitting: Physician Assistant

## 2024-02-07 ENCOUNTER — Other Ambulatory Visit (HOSPITAL_COMMUNITY): Payer: Self-pay

## 2024-02-07 ENCOUNTER — Ambulatory Visit (INDEPENDENT_AMBULATORY_CARE_PROVIDER_SITE_OTHER): Admitting: Physician Assistant

## 2024-02-07 DIAGNOSIS — S72142G Displaced intertrochanteric fracture of left femur, subsequent encounter for closed fracture with delayed healing: Secondary | ICD-10-CM

## 2024-02-07 MED ORDER — OXYCODONE HCL 5 MG PO TABS
5.0000 mg | ORAL_TABLET | Freq: Three times a day (TID) | ORAL | 0 refills | Status: AC | PRN
  Filled 2024-02-07 – 2024-02-22 (×2): qty 30, 10d supply, fill #0

## 2024-02-07 NOTE — Progress Notes (Signed)
 HPI: Tiffany [name] returns today for almost 7 weeks status post open reduction internal fixation left complex proximal femur fracture.  She is overall doing well.  States she is able to take a few steps with a walker.  She has been 50% weightbearing.  She is currently at home.  Therapy is coming out working with her at her home.  She presents today in a wheelchair.  She states she has some moderate pain and continues to take oxycodone  occasionally.  Physical exam: Left hip: Good range of motion of the hip without pain calf supple nontender.  Dorsiflexion plantarflexion left ankle intact.  Radiographs: Left femur 2 views: Hip is well located.  Status post IM nailing without hardware failure.  Significant consolidation since radiographs at last visit.  No change in overall alignment position.  Impression: Status post left femur open reduction internal fixation 12/21/2023  Plan: Will advance her to weightbearing as tolerated on the left.  Will see her back in 4 weeks to see how she is doing overall.  Questions were encouraged and answered at length.  Refill on pain medication was given.

## 2024-02-09 ENCOUNTER — Telehealth: Payer: Self-pay | Admitting: Radiology

## 2024-02-09 DIAGNOSIS — E785 Hyperlipidemia, unspecified: Secondary | ICD-10-CM | POA: Diagnosis not present

## 2024-02-09 DIAGNOSIS — K2100 Gastro-esophageal reflux disease with esophagitis, without bleeding: Secondary | ICD-10-CM | POA: Diagnosis not present

## 2024-02-09 DIAGNOSIS — Z9181 History of falling: Secondary | ICD-10-CM | POA: Diagnosis not present

## 2024-02-09 DIAGNOSIS — M80052D Age-related osteoporosis with current pathological fracture, left femur, subsequent encounter for fracture with routine healing: Secondary | ICD-10-CM | POA: Diagnosis not present

## 2024-02-09 DIAGNOSIS — E8809 Other disorders of plasma-protein metabolism, not elsewhere classified: Secondary | ICD-10-CM | POA: Diagnosis not present

## 2024-02-09 DIAGNOSIS — R1312 Dysphagia, oropharyngeal phase: Secondary | ICD-10-CM | POA: Diagnosis not present

## 2024-02-09 DIAGNOSIS — S72142D Displaced intertrochanteric fracture of left femur, subsequent encounter for closed fracture with routine healing: Secondary | ICD-10-CM | POA: Diagnosis not present

## 2024-02-09 DIAGNOSIS — J9601 Acute respiratory failure with hypoxia: Secondary | ICD-10-CM | POA: Diagnosis not present

## 2024-02-09 DIAGNOSIS — I10 Essential (primary) hypertension: Secondary | ICD-10-CM | POA: Diagnosis not present

## 2024-02-09 DIAGNOSIS — Z7901 Long term (current) use of anticoagulants: Secondary | ICD-10-CM | POA: Diagnosis not present

## 2024-02-09 DIAGNOSIS — K2970 Gastritis, unspecified, without bleeding: Secondary | ICD-10-CM | POA: Diagnosis not present

## 2024-02-09 DIAGNOSIS — K222 Esophageal obstruction: Secondary | ICD-10-CM | POA: Diagnosis not present

## 2024-02-09 DIAGNOSIS — D649 Anemia, unspecified: Secondary | ICD-10-CM | POA: Diagnosis not present

## 2024-02-09 DIAGNOSIS — G4700 Insomnia, unspecified: Secondary | ICD-10-CM | POA: Diagnosis not present

## 2024-02-09 NOTE — Telephone Encounter (Signed)
 Patient is being seen by [facility]. PT with [facility], advised weight bearing as tolerated on left side now. [facility] stated he did receive Rx.   Office [phone] Cell #[phone]

## 2024-02-13 DIAGNOSIS — J9601 Acute respiratory failure with hypoxia: Secondary | ICD-10-CM | POA: Diagnosis not present

## 2024-02-13 DIAGNOSIS — G4700 Insomnia, unspecified: Secondary | ICD-10-CM | POA: Diagnosis not present

## 2024-02-13 DIAGNOSIS — K222 Esophageal obstruction: Secondary | ICD-10-CM | POA: Diagnosis not present

## 2024-02-13 DIAGNOSIS — S72142D Displaced intertrochanteric fracture of left femur, subsequent encounter for closed fracture with routine healing: Secondary | ICD-10-CM | POA: Diagnosis not present

## 2024-02-13 DIAGNOSIS — K2970 Gastritis, unspecified, without bleeding: Secondary | ICD-10-CM | POA: Diagnosis not present

## 2024-02-13 DIAGNOSIS — M80052D Age-related osteoporosis with current pathological fracture, left femur, subsequent encounter for fracture with routine healing: Secondary | ICD-10-CM | POA: Diagnosis not present

## 2024-02-13 DIAGNOSIS — I10 Essential (primary) hypertension: Secondary | ICD-10-CM | POA: Diagnosis not present

## 2024-02-13 DIAGNOSIS — E8809 Other disorders of plasma-protein metabolism, not elsewhere classified: Secondary | ICD-10-CM | POA: Diagnosis not present

## 2024-02-13 DIAGNOSIS — R1312 Dysphagia, oropharyngeal phase: Secondary | ICD-10-CM | POA: Diagnosis not present

## 2024-02-13 DIAGNOSIS — D649 Anemia, unspecified: Secondary | ICD-10-CM | POA: Diagnosis not present

## 2024-02-13 DIAGNOSIS — Z9181 History of falling: Secondary | ICD-10-CM | POA: Diagnosis not present

## 2024-02-13 DIAGNOSIS — K2100 Gastro-esophageal reflux disease with esophagitis, without bleeding: Secondary | ICD-10-CM | POA: Diagnosis not present

## 2024-02-13 DIAGNOSIS — E785 Hyperlipidemia, unspecified: Secondary | ICD-10-CM | POA: Diagnosis not present

## 2024-02-13 DIAGNOSIS — Z7901 Long term (current) use of anticoagulants: Secondary | ICD-10-CM | POA: Diagnosis not present

## 2024-02-16 DIAGNOSIS — G4700 Insomnia, unspecified: Secondary | ICD-10-CM | POA: Diagnosis not present

## 2024-02-19 DIAGNOSIS — K2970 Gastritis, unspecified, without bleeding: Secondary | ICD-10-CM | POA: Diagnosis not present

## 2024-02-19 DIAGNOSIS — K2100 Gastro-esophageal reflux disease with esophagitis, without bleeding: Secondary | ICD-10-CM | POA: Diagnosis not present

## 2024-02-19 DIAGNOSIS — M80052D Age-related osteoporosis with current pathological fracture, left femur, subsequent encounter for fracture with routine healing: Secondary | ICD-10-CM | POA: Diagnosis not present

## 2024-02-19 DIAGNOSIS — K222 Esophageal obstruction: Secondary | ICD-10-CM | POA: Diagnosis not present

## 2024-02-19 DIAGNOSIS — Z7901 Long term (current) use of anticoagulants: Secondary | ICD-10-CM | POA: Diagnosis not present

## 2024-02-19 DIAGNOSIS — R1312 Dysphagia, oropharyngeal phase: Secondary | ICD-10-CM | POA: Diagnosis not present

## 2024-02-19 DIAGNOSIS — G4700 Insomnia, unspecified: Secondary | ICD-10-CM | POA: Diagnosis not present

## 2024-02-19 DIAGNOSIS — E785 Hyperlipidemia, unspecified: Secondary | ICD-10-CM | POA: Diagnosis not present

## 2024-02-19 DIAGNOSIS — S72142D Displaced intertrochanteric fracture of left femur, subsequent encounter for closed fracture with routine healing: Secondary | ICD-10-CM | POA: Diagnosis not present

## 2024-02-19 DIAGNOSIS — J9601 Acute respiratory failure with hypoxia: Secondary | ICD-10-CM | POA: Diagnosis not present

## 2024-02-19 DIAGNOSIS — I10 Essential (primary) hypertension: Secondary | ICD-10-CM | POA: Diagnosis not present

## 2024-02-19 DIAGNOSIS — E8809 Other disorders of plasma-protein metabolism, not elsewhere classified: Secondary | ICD-10-CM | POA: Diagnosis not present

## 2024-02-19 DIAGNOSIS — Z9181 History of falling: Secondary | ICD-10-CM | POA: Diagnosis not present

## 2024-02-19 DIAGNOSIS — D649 Anemia, unspecified: Secondary | ICD-10-CM | POA: Diagnosis not present

## 2024-02-20 ENCOUNTER — Telehealth: Payer: Self-pay

## 2024-02-20 DIAGNOSIS — S72142D Displaced intertrochanteric fracture of left femur, subsequent encounter for closed fracture with routine healing: Secondary | ICD-10-CM | POA: Diagnosis not present

## 2024-02-20 DIAGNOSIS — D649 Anemia, unspecified: Secondary | ICD-10-CM | POA: Diagnosis not present

## 2024-02-20 DIAGNOSIS — E785 Hyperlipidemia, unspecified: Secondary | ICD-10-CM | POA: Diagnosis not present

## 2024-02-20 DIAGNOSIS — K2100 Gastro-esophageal reflux disease with esophagitis, without bleeding: Secondary | ICD-10-CM | POA: Diagnosis not present

## 2024-02-20 DIAGNOSIS — K222 Esophageal obstruction: Secondary | ICD-10-CM | POA: Diagnosis not present

## 2024-02-20 DIAGNOSIS — M80052D Age-related osteoporosis with current pathological fracture, left femur, subsequent encounter for fracture with routine healing: Secondary | ICD-10-CM | POA: Diagnosis not present

## 2024-02-20 DIAGNOSIS — R1312 Dysphagia, oropharyngeal phase: Secondary | ICD-10-CM | POA: Diagnosis not present

## 2024-02-20 DIAGNOSIS — Z7901 Long term (current) use of anticoagulants: Secondary | ICD-10-CM | POA: Diagnosis not present

## 2024-02-20 DIAGNOSIS — I10 Essential (primary) hypertension: Secondary | ICD-10-CM | POA: Diagnosis not present

## 2024-02-20 DIAGNOSIS — E8809 Other disorders of plasma-protein metabolism, not elsewhere classified: Secondary | ICD-10-CM | POA: Diagnosis not present

## 2024-02-20 DIAGNOSIS — J9601 Acute respiratory failure with hypoxia: Secondary | ICD-10-CM | POA: Diagnosis not present

## 2024-02-20 DIAGNOSIS — Z9181 History of falling: Secondary | ICD-10-CM | POA: Diagnosis not present

## 2024-02-20 DIAGNOSIS — K2970 Gastritis, unspecified, without bleeding: Secondary | ICD-10-CM | POA: Diagnosis not present

## 2024-02-20 NOTE — Telephone Encounter (Signed)
 Left message to return call to our office.

## 2024-02-20 NOTE — Telephone Encounter (Signed)
 Copied from CRM [id number]. Topic: Clinical - Home Health Verbal Orders >> Feb 20, 2024 11:59 AM Frederich PARAS wrote: Caller/Agency: [facility]  Callback Number: [phone]  Service Requested: Speech Therapy Frequency: 1 week 5  Any new concerns about the patient? No

## 2024-02-20 NOTE — Telephone Encounter (Signed)
 Okay to provide verbal order as requested for speech therapy.

## 2024-02-21 DIAGNOSIS — E785 Hyperlipidemia, unspecified: Secondary | ICD-10-CM | POA: Diagnosis not present

## 2024-02-21 DIAGNOSIS — G4700 Insomnia, unspecified: Secondary | ICD-10-CM | POA: Diagnosis not present

## 2024-02-21 DIAGNOSIS — J9601 Acute respiratory failure with hypoxia: Secondary | ICD-10-CM | POA: Diagnosis not present

## 2024-02-21 DIAGNOSIS — R1312 Dysphagia, oropharyngeal phase: Secondary | ICD-10-CM | POA: Diagnosis not present

## 2024-02-21 DIAGNOSIS — M80052D Age-related osteoporosis with current pathological fracture, left femur, subsequent encounter for fracture with routine healing: Secondary | ICD-10-CM | POA: Diagnosis not present

## 2024-02-21 DIAGNOSIS — F419 Anxiety disorder, unspecified: Secondary | ICD-10-CM

## 2024-02-21 DIAGNOSIS — E8809 Other disorders of plasma-protein metabolism, not elsewhere classified: Secondary | ICD-10-CM | POA: Diagnosis not present

## 2024-02-21 DIAGNOSIS — K2970 Gastritis, unspecified, without bleeding: Secondary | ICD-10-CM | POA: Diagnosis not present

## 2024-02-21 DIAGNOSIS — K2100 Gastro-esophageal reflux disease with esophagitis, without bleeding: Secondary | ICD-10-CM | POA: Diagnosis not present

## 2024-02-21 DIAGNOSIS — D649 Anemia, unspecified: Secondary | ICD-10-CM | POA: Diagnosis not present

## 2024-02-21 DIAGNOSIS — K222 Esophageal obstruction: Secondary | ICD-10-CM | POA: Diagnosis not present

## 2024-02-21 DIAGNOSIS — I10 Essential (primary) hypertension: Secondary | ICD-10-CM | POA: Diagnosis not present

## 2024-02-22 ENCOUNTER — Other Ambulatory Visit (HOSPITAL_COMMUNITY): Payer: Self-pay

## 2024-02-22 ENCOUNTER — Other Ambulatory Visit: Payer: Self-pay

## 2024-02-23 ENCOUNTER — Other Ambulatory Visit: Payer: Self-pay | Admitting: Family Medicine

## 2024-02-23 ENCOUNTER — Other Ambulatory Visit (HOSPITAL_COMMUNITY): Payer: Self-pay

## 2024-02-23 DIAGNOSIS — I700 Atherosclerosis of aorta: Secondary | ICD-10-CM | POA: Diagnosis not present

## 2024-02-23 DIAGNOSIS — M5030 Other cervical disc degeneration, unspecified cervical region: Secondary | ICD-10-CM | POA: Diagnosis not present

## 2024-02-23 DIAGNOSIS — S72142D Displaced intertrochanteric fracture of left femur, subsequent encounter for closed fracture with routine healing: Secondary | ICD-10-CM | POA: Diagnosis not present

## 2024-02-23 MED ORDER — NORTRIPTYLINE HCL 50 MG PO CAPS
150.0000 mg | ORAL_CAPSULE | Freq: Every day | ORAL | 1 refills | Status: AC
  Filled 2024-02-23 – 2024-03-13 (×4): qty 270, 90d supply, fill #0
  Filled 2024-06-04: qty 270, 90d supply, fill #1

## 2024-02-23 NOTE — Telephone Encounter (Signed)
 Last filled on 01/15/24 #45 caps/ 0 refill it was filled by a Joesph Console, FNP  Last OV was a hospital f/u on 01/16/24

## 2024-02-23 NOTE — Telephone Encounter (Signed)
 Called Tiffany [name] orders given will call office if any further questions.

## 2024-02-26 ENCOUNTER — Other Ambulatory Visit (HOSPITAL_COMMUNITY): Payer: Self-pay

## 2024-02-26 DIAGNOSIS — E8809 Other disorders of plasma-protein metabolism, not elsewhere classified: Secondary | ICD-10-CM | POA: Diagnosis not present

## 2024-02-26 DIAGNOSIS — D649 Anemia, unspecified: Secondary | ICD-10-CM | POA: Diagnosis not present

## 2024-02-26 DIAGNOSIS — R1312 Dysphagia, oropharyngeal phase: Secondary | ICD-10-CM | POA: Diagnosis not present

## 2024-02-26 DIAGNOSIS — G4700 Insomnia, unspecified: Secondary | ICD-10-CM | POA: Diagnosis not present

## 2024-02-26 DIAGNOSIS — E785 Hyperlipidemia, unspecified: Secondary | ICD-10-CM | POA: Diagnosis not present

## 2024-02-26 DIAGNOSIS — J9601 Acute respiratory failure with hypoxia: Secondary | ICD-10-CM | POA: Diagnosis not present

## 2024-02-26 DIAGNOSIS — Z9181 History of falling: Secondary | ICD-10-CM | POA: Diagnosis not present

## 2024-02-26 DIAGNOSIS — S72142D Displaced intertrochanteric fracture of left femur, subsequent encounter for closed fracture with routine healing: Secondary | ICD-10-CM | POA: Diagnosis not present

## 2024-02-26 DIAGNOSIS — K2970 Gastritis, unspecified, without bleeding: Secondary | ICD-10-CM | POA: Diagnosis not present

## 2024-02-26 DIAGNOSIS — K2100 Gastro-esophageal reflux disease with esophagitis, without bleeding: Secondary | ICD-10-CM | POA: Diagnosis not present

## 2024-02-26 DIAGNOSIS — Z7901 Long term (current) use of anticoagulants: Secondary | ICD-10-CM | POA: Diagnosis not present

## 2024-02-26 DIAGNOSIS — I10 Essential (primary) hypertension: Secondary | ICD-10-CM | POA: Diagnosis not present

## 2024-02-26 DIAGNOSIS — K222 Esophageal obstruction: Secondary | ICD-10-CM | POA: Diagnosis not present

## 2024-02-26 DIAGNOSIS — M80052D Age-related osteoporosis with current pathological fracture, left femur, subsequent encounter for fracture with routine healing: Secondary | ICD-10-CM | POA: Diagnosis not present

## 2024-02-28 ENCOUNTER — Other Ambulatory Visit (HOSPITAL_COMMUNITY): Payer: Self-pay

## 2024-02-29 ENCOUNTER — Other Ambulatory Visit (HOSPITAL_COMMUNITY): Payer: Self-pay

## 2024-03-01 ENCOUNTER — Other Ambulatory Visit: Payer: Self-pay

## 2024-03-01 ENCOUNTER — Other Ambulatory Visit (HOSPITAL_COMMUNITY): Payer: Self-pay

## 2024-03-04 ENCOUNTER — Other Ambulatory Visit (HOSPITAL_COMMUNITY): Payer: Self-pay

## 2024-03-04 DIAGNOSIS — M80052D Age-related osteoporosis with current pathological fracture, left femur, subsequent encounter for fracture with routine healing: Secondary | ICD-10-CM | POA: Diagnosis not present

## 2024-03-04 DIAGNOSIS — K2970 Gastritis, unspecified, without bleeding: Secondary | ICD-10-CM | POA: Diagnosis not present

## 2024-03-04 DIAGNOSIS — K222 Esophageal obstruction: Secondary | ICD-10-CM | POA: Diagnosis not present

## 2024-03-04 DIAGNOSIS — E8809 Other disorders of plasma-protein metabolism, not elsewhere classified: Secondary | ICD-10-CM | POA: Diagnosis not present

## 2024-03-04 DIAGNOSIS — R1312 Dysphagia, oropharyngeal phase: Secondary | ICD-10-CM | POA: Diagnosis not present

## 2024-03-04 DIAGNOSIS — G4700 Insomnia, unspecified: Secondary | ICD-10-CM | POA: Diagnosis not present

## 2024-03-04 DIAGNOSIS — K2100 Gastro-esophageal reflux disease with esophagitis, without bleeding: Secondary | ICD-10-CM | POA: Diagnosis not present

## 2024-03-04 DIAGNOSIS — J9601 Acute respiratory failure with hypoxia: Secondary | ICD-10-CM | POA: Diagnosis not present

## 2024-03-04 DIAGNOSIS — I10 Essential (primary) hypertension: Secondary | ICD-10-CM | POA: Diagnosis not present

## 2024-03-04 DIAGNOSIS — Z9181 History of falling: Secondary | ICD-10-CM | POA: Diagnosis not present

## 2024-03-04 DIAGNOSIS — E785 Hyperlipidemia, unspecified: Secondary | ICD-10-CM | POA: Diagnosis not present

## 2024-03-04 DIAGNOSIS — S72142D Displaced intertrochanteric fracture of left femur, subsequent encounter for closed fracture with routine healing: Secondary | ICD-10-CM | POA: Diagnosis not present

## 2024-03-04 DIAGNOSIS — Z7901 Long term (current) use of anticoagulants: Secondary | ICD-10-CM | POA: Diagnosis not present

## 2024-03-04 DIAGNOSIS — D649 Anemia, unspecified: Secondary | ICD-10-CM | POA: Diagnosis not present

## 2024-03-04 MED ORDER — OXYCODONE HCL 5 MG PO TABS
5.0000 mg | ORAL_TABLET | Freq: Three times a day (TID) | ORAL | 0 refills | Status: DC | PRN
  Filled 2024-03-04: qty 48, 16d supply, fill #0

## 2024-03-04 MED ORDER — BUSPIRONE HCL 5 MG PO TABS
5.0000 mg | ORAL_TABLET | Freq: Three times a day (TID) | ORAL | 0 refills | Status: DC | PRN
  Filled 2024-03-04: qty 48, 16d supply, fill #0

## 2024-03-06 ENCOUNTER — Encounter: Payer: Self-pay | Admitting: Physician Assistant

## 2024-03-06 ENCOUNTER — Ambulatory Visit: Admitting: Physician Assistant

## 2024-03-06 ENCOUNTER — Other Ambulatory Visit: Payer: Self-pay

## 2024-03-06 DIAGNOSIS — S72142G Displaced intertrochanteric fracture of left femur, subsequent encounter for closed fracture with delayed healing: Secondary | ICD-10-CM

## 2024-03-06 NOTE — Progress Notes (Signed)
 HPI: [name] returns today follow-up status post left femur IM nailing.  She states overall she is slowly improving.  Some pain especially when getting up and down.  She is using a walker to ambulate.  She is doing home therapy.  She said no new injuries.  Currently ambulating with walker.  Physical exam: Left hip good range of motion.  Left calf supple nontender.  Dorsiflexion plantarflexion left ankle intact.  Radiographs: Left femur 2 views hips well located.  Status post IM nailing no hardware failure.  Good consolidation at the fracture site.  No change in overall position alignment.  Impression: Status post left femur open reduction internal fixation 12/21/2023  Plan: She will continue to work on range of motion and strengthening.  She is activities as tolerated.  Follow-up with us  as needed.  Questions were encouraged and answered

## 2024-03-07 ENCOUNTER — Other Ambulatory Visit (HOSPITAL_COMMUNITY): Payer: Self-pay

## 2024-03-08 ENCOUNTER — Other Ambulatory Visit: Payer: Self-pay | Admitting: Family Medicine

## 2024-03-08 ENCOUNTER — Other Ambulatory Visit (HOSPITAL_COMMUNITY): Payer: Self-pay

## 2024-03-08 NOTE — Telephone Encounter (Signed)
 Last office visit 01/16/2024 for hosptial follow up.  Last refilled 12/26/23 for #10 with no refills by Dr. Sherrill.  Next Appt: No future appointments with PCP.  Refill?  Increase quantity?

## 2024-03-11 DIAGNOSIS — K222 Esophageal obstruction: Secondary | ICD-10-CM | POA: Diagnosis not present

## 2024-03-11 DIAGNOSIS — R1312 Dysphagia, oropharyngeal phase: Secondary | ICD-10-CM | POA: Diagnosis not present

## 2024-03-11 DIAGNOSIS — I10 Essential (primary) hypertension: Secondary | ICD-10-CM | POA: Diagnosis not present

## 2024-03-11 DIAGNOSIS — Z9181 History of falling: Secondary | ICD-10-CM | POA: Diagnosis not present

## 2024-03-11 DIAGNOSIS — G4700 Insomnia, unspecified: Secondary | ICD-10-CM | POA: Diagnosis not present

## 2024-03-11 DIAGNOSIS — D649 Anemia, unspecified: Secondary | ICD-10-CM | POA: Diagnosis not present

## 2024-03-11 DIAGNOSIS — E785 Hyperlipidemia, unspecified: Secondary | ICD-10-CM | POA: Diagnosis not present

## 2024-03-11 DIAGNOSIS — K2970 Gastritis, unspecified, without bleeding: Secondary | ICD-10-CM | POA: Diagnosis not present

## 2024-03-11 DIAGNOSIS — M80052D Age-related osteoporosis with current pathological fracture, left femur, subsequent encounter for fracture with routine healing: Secondary | ICD-10-CM | POA: Diagnosis not present

## 2024-03-11 DIAGNOSIS — E8809 Other disorders of plasma-protein metabolism, not elsewhere classified: Secondary | ICD-10-CM | POA: Diagnosis not present

## 2024-03-11 DIAGNOSIS — J9601 Acute respiratory failure with hypoxia: Secondary | ICD-10-CM | POA: Diagnosis not present

## 2024-03-11 DIAGNOSIS — S72142D Displaced intertrochanteric fracture of left femur, subsequent encounter for closed fracture with routine healing: Secondary | ICD-10-CM | POA: Diagnosis not present

## 2024-03-11 DIAGNOSIS — K2100 Gastro-esophageal reflux disease with esophagitis, without bleeding: Secondary | ICD-10-CM | POA: Diagnosis not present

## 2024-03-11 DIAGNOSIS — Z7901 Long term (current) use of anticoagulants: Secondary | ICD-10-CM | POA: Diagnosis not present

## 2024-03-12 ENCOUNTER — Other Ambulatory Visit (HOSPITAL_COMMUNITY): Payer: Self-pay

## 2024-03-12 ENCOUNTER — Ambulatory Visit: Admitting: Pulmonary Disease

## 2024-03-12 DIAGNOSIS — K2970 Gastritis, unspecified, without bleeding: Secondary | ICD-10-CM | POA: Diagnosis not present

## 2024-03-12 DIAGNOSIS — K2100 Gastro-esophageal reflux disease with esophagitis, without bleeding: Secondary | ICD-10-CM | POA: Diagnosis not present

## 2024-03-12 DIAGNOSIS — I10 Essential (primary) hypertension: Secondary | ICD-10-CM | POA: Diagnosis not present

## 2024-03-12 DIAGNOSIS — E8809 Other disorders of plasma-protein metabolism, not elsewhere classified: Secondary | ICD-10-CM | POA: Diagnosis not present

## 2024-03-12 DIAGNOSIS — S72142D Displaced intertrochanteric fracture of left femur, subsequent encounter for closed fracture with routine healing: Secondary | ICD-10-CM | POA: Diagnosis not present

## 2024-03-12 DIAGNOSIS — J9601 Acute respiratory failure with hypoxia: Secondary | ICD-10-CM | POA: Diagnosis not present

## 2024-03-12 DIAGNOSIS — G4700 Insomnia, unspecified: Secondary | ICD-10-CM | POA: Diagnosis not present

## 2024-03-12 DIAGNOSIS — Z7901 Long term (current) use of anticoagulants: Secondary | ICD-10-CM | POA: Diagnosis not present

## 2024-03-12 DIAGNOSIS — M80052D Age-related osteoporosis with current pathological fracture, left femur, subsequent encounter for fracture with routine healing: Secondary | ICD-10-CM | POA: Diagnosis not present

## 2024-03-12 DIAGNOSIS — R1312 Dysphagia, oropharyngeal phase: Secondary | ICD-10-CM | POA: Diagnosis not present

## 2024-03-12 DIAGNOSIS — K222 Esophageal obstruction: Secondary | ICD-10-CM | POA: Diagnosis not present

## 2024-03-12 DIAGNOSIS — Z9181 History of falling: Secondary | ICD-10-CM | POA: Diagnosis not present

## 2024-03-12 DIAGNOSIS — D649 Anemia, unspecified: Secondary | ICD-10-CM | POA: Diagnosis not present

## 2024-03-12 DIAGNOSIS — E785 Hyperlipidemia, unspecified: Secondary | ICD-10-CM | POA: Diagnosis not present

## 2024-03-12 MED ORDER — DOCUSATE SODIUM 100 MG PO CAPS
100.0000 mg | ORAL_CAPSULE | Freq: Two times a day (BID) | ORAL | 0 refills | Status: AC | PRN
  Filled 2024-03-12: qty 30, 15d supply, fill #0

## 2024-03-13 ENCOUNTER — Other Ambulatory Visit: Payer: Self-pay | Admitting: Family Medicine

## 2024-03-13 ENCOUNTER — Other Ambulatory Visit (HOSPITAL_COMMUNITY): Payer: Self-pay

## 2024-03-13 ENCOUNTER — Other Ambulatory Visit: Payer: Self-pay

## 2024-03-13 MED ORDER — PANTOPRAZOLE SODIUM 40 MG PO TBEC
40.0000 mg | DELAYED_RELEASE_TABLET | Freq: Every day | ORAL | 1 refills | Status: AC
  Filled 2024-03-13: qty 90, 90d supply, fill #0
  Filled 2024-06-19: qty 90, 90d supply, fill #1

## 2024-03-13 MED ORDER — AMLODIPINE BESYLATE 2.5 MG PO TABS
2.5000 mg | ORAL_TABLET | Freq: Every day | ORAL | 5 refills | Status: AC
  Filled 2024-03-13: qty 30, 30d supply, fill #0
  Filled 2024-04-08 – 2024-04-19 (×2): qty 30, 30d supply, fill #1
  Filled 2024-05-22: qty 30, 30d supply, fill #2
  Filled 2024-06-21: qty 30, 30d supply, fill #3

## 2024-03-13 MED ORDER — ATORVASTATIN CALCIUM 10 MG PO TABS
10.0000 mg | ORAL_TABLET | Freq: Every evening | ORAL | 5 refills | Status: AC
  Filled 2024-03-13: qty 30, 30d supply, fill #0
  Filled 2024-04-08 – 2024-04-19 (×2): qty 30, 30d supply, fill #1
  Filled 2024-05-22: qty 30, 30d supply, fill #2
  Filled 2024-06-21: qty 30, 30d supply, fill #3

## 2024-03-14 ENCOUNTER — Other Ambulatory Visit (HOSPITAL_COMMUNITY): Payer: Self-pay

## 2024-03-14 ENCOUNTER — Other Ambulatory Visit: Payer: Self-pay

## 2024-03-15 ENCOUNTER — Other Ambulatory Visit: Payer: Self-pay

## 2024-03-15 ENCOUNTER — Other Ambulatory Visit (HOSPITAL_COMMUNITY): Payer: Self-pay

## 2024-03-19 ENCOUNTER — Other Ambulatory Visit (HOSPITAL_COMMUNITY): Payer: Self-pay

## 2024-03-20 ENCOUNTER — Other Ambulatory Visit (HOSPITAL_COMMUNITY): Payer: Self-pay

## 2024-03-20 DIAGNOSIS — M47816 Spondylosis without myelopathy or radiculopathy, lumbar region: Secondary | ICD-10-CM | POA: Diagnosis not present

## 2024-03-20 DIAGNOSIS — M62830 Muscle spasm of back: Secondary | ICD-10-CM | POA: Diagnosis not present

## 2024-03-20 DIAGNOSIS — G894 Chronic pain syndrome: Secondary | ICD-10-CM | POA: Diagnosis not present

## 2024-03-20 DIAGNOSIS — M48062 Spinal stenosis, lumbar region with neurogenic claudication: Secondary | ICD-10-CM | POA: Diagnosis not present

## 2024-03-20 MED ORDER — PAROXETINE HCL 40 MG PO TABS
40.0000 mg | ORAL_TABLET | Freq: Every day | ORAL | 3 refills | Status: AC
  Filled 2024-03-20: qty 30, 30d supply, fill #0
  Filled 2024-04-16: qty 30, 30d supply, fill #1
  Filled 2024-06-11: qty 30, 30d supply, fill #2
  Filled 2024-07-10: qty 30, 30d supply, fill #3

## 2024-03-20 MED ORDER — BUSPIRONE HCL 5 MG PO TABS
5.0000 mg | ORAL_TABLET | Freq: Three times a day (TID) | ORAL | 1 refills | Status: DC | PRN
  Filled 2024-03-20: qty 90, 30d supply, fill #0
  Filled 2024-04-16: qty 90, 30d supply, fill #1

## 2024-03-20 MED ORDER — OXYCODONE HCL 5 MG PO TABS
5.0000 mg | ORAL_TABLET | Freq: Three times a day (TID) | ORAL | 0 refills | Status: AC | PRN
  Filled 2024-03-20: qty 90, 30d supply, fill #0

## 2024-03-20 MED ORDER — OXYCODONE HCL 5 MG PO TABS
5.0000 mg | ORAL_TABLET | Freq: Three times a day (TID) | ORAL | 0 refills | Status: AC | PRN
  Filled 2024-04-18: qty 90, 30d supply, fill #0

## 2024-03-20 MED ORDER — METHOCARBAMOL 500 MG PO TABS
500.0000 mg | ORAL_TABLET | Freq: Three times a day (TID) | ORAL | 2 refills | Status: AC | PRN
  Filled 2024-03-20: qty 90, 30d supply, fill #0

## 2024-03-21 ENCOUNTER — Encounter: Payer: Self-pay | Admitting: Pharmacist

## 2024-03-21 NOTE — Progress Notes (Signed)
 Pharmacy Quality Measure Review  This patient is appearing on a report for being at risk of failing the adherence measure for cholesterol (statin) medications this calendar year.   Medication: atorvastatin  10 mg Last fill date: 02/07/24 for 30 day supply  Insurance report was not up to date. No action needed at this time.  Medication has been refilled as of 03/13/24.  5 additional refills remaining.  Filled at [facility] pharmacy. Next refill due 10/31 - Reminder set

## 2024-03-22 ENCOUNTER — Other Ambulatory Visit (HOSPITAL_COMMUNITY): Payer: Self-pay

## 2024-03-24 DIAGNOSIS — I700 Atherosclerosis of aorta: Secondary | ICD-10-CM | POA: Diagnosis not present

## 2024-03-24 DIAGNOSIS — S72142D Displaced intertrochanteric fracture of left femur, subsequent encounter for closed fracture with routine healing: Secondary | ICD-10-CM | POA: Diagnosis not present

## 2024-03-24 DIAGNOSIS — M5030 Other cervical disc degeneration, unspecified cervical region: Secondary | ICD-10-CM | POA: Diagnosis not present

## 2024-03-25 ENCOUNTER — Other Ambulatory Visit (HOSPITAL_COMMUNITY): Payer: Self-pay

## 2024-04-01 ENCOUNTER — Other Ambulatory Visit (HOSPITAL_COMMUNITY): Payer: Self-pay

## 2024-04-02 ENCOUNTER — Other Ambulatory Visit (HOSPITAL_COMMUNITY): Payer: Self-pay

## 2024-04-04 ENCOUNTER — Other Ambulatory Visit (HOSPITAL_COMMUNITY): Payer: Self-pay

## 2024-04-08 ENCOUNTER — Other Ambulatory Visit (HOSPITAL_COMMUNITY): Payer: Self-pay

## 2024-04-09 ENCOUNTER — Other Ambulatory Visit (HOSPITAL_COMMUNITY): Payer: Self-pay

## 2024-04-10 ENCOUNTER — Other Ambulatory Visit (HOSPITAL_COMMUNITY): Payer: Self-pay

## 2024-04-12 ENCOUNTER — Encounter: Payer: Self-pay | Admitting: Pharmacist

## 2024-04-12 NOTE — Progress Notes (Signed)
 Pharmacy Quality Measure Review  This patient is appearing on a report for being at risk of failing the adherence measure for cholesterol (statin) medications this calendar year.   Medication: atorvastatin  10 mg Last fill date: 03/13/24 for 30 day supply  Reminder set to ensure refill by oct 31st. Medication has been refilled as of 04/08/24 x30 ds.  4 additional refills remaining.   Insurance report was not up to date. No action needed at this time.

## 2024-04-15 ENCOUNTER — Encounter: Payer: Self-pay | Admitting: Radiology

## 2024-04-16 ENCOUNTER — Other Ambulatory Visit (HOSPITAL_COMMUNITY): Payer: Self-pay

## 2024-04-17 ENCOUNTER — Other Ambulatory Visit (HOSPITAL_COMMUNITY): Payer: Self-pay

## 2024-04-18 ENCOUNTER — Other Ambulatory Visit (HOSPITAL_COMMUNITY): Payer: Self-pay

## 2024-04-19 ENCOUNTER — Other Ambulatory Visit: Payer: Self-pay

## 2024-04-19 ENCOUNTER — Other Ambulatory Visit (HOSPITAL_COMMUNITY): Payer: Self-pay

## 2024-04-25 ENCOUNTER — Other Ambulatory Visit (HOSPITAL_COMMUNITY): Payer: Self-pay

## 2024-04-25 ENCOUNTER — Encounter (HOSPITAL_COMMUNITY): Payer: Self-pay

## 2024-04-26 ENCOUNTER — Other Ambulatory Visit (HOSPITAL_COMMUNITY): Payer: Self-pay

## 2024-04-29 ENCOUNTER — Other Ambulatory Visit (HOSPITAL_COMMUNITY): Payer: Self-pay

## 2024-04-30 ENCOUNTER — Other Ambulatory Visit (HOSPITAL_COMMUNITY): Payer: Self-pay

## 2024-05-13 ENCOUNTER — Other Ambulatory Visit (HOSPITAL_COMMUNITY): Payer: Self-pay

## 2024-05-14 ENCOUNTER — Encounter: Payer: Self-pay | Admitting: Family Medicine

## 2024-05-14 ENCOUNTER — Other Ambulatory Visit (HOSPITAL_COMMUNITY): Payer: Self-pay

## 2024-05-14 ENCOUNTER — Encounter: Payer: Self-pay | Admitting: Pharmacist

## 2024-05-14 NOTE — Progress Notes (Signed)
 Pharmacy Quality Measure Review  This patient is appearing on a report for being at risk of failing the adherence measure for cholesterol (statin) medications this calendar year.   Medication: atorvastatin  10 mg Last fill date: 10/27 for 30 day supply  Medication was never picked up on 10/27. Later refilled and dispensed on 11/17 x30 ds.  Next refill due ~12/17. Reminder set.

## 2024-05-14 NOTE — Progress Notes (Signed)
 Pharmacy Quality Measure Review  This patient is appearing on a report for being at risk of failing the adherence measure for cholesterol (statin) medications this calendar year.   Medication: Atorvastatin  10mg  Last fill date: 11/17 for 30 day supply  Insurance report was not up to date. No action needed at this time.   Angela Baalmann, PharmD [facility] Pharmacist

## 2024-05-15 ENCOUNTER — Other Ambulatory Visit (HOSPITAL_COMMUNITY): Payer: Self-pay

## 2024-05-15 MED ORDER — BUSPIRONE HCL 5 MG PO TABS
5.0000 mg | ORAL_TABLET | Freq: Three times a day (TID) | ORAL | 1 refills | Status: AC | PRN
  Filled 2024-05-15 (×2): qty 90, 30d supply, fill #0
  Filled 2024-06-11: qty 90, 30d supply, fill #1

## 2024-05-15 MED ORDER — METHOCARBAMOL 500 MG PO TABS
500.0000 mg | ORAL_TABLET | Freq: Three times a day (TID) | ORAL | 2 refills | Status: AC | PRN
  Filled 2024-05-15: qty 90, 30d supply, fill #0
  Filled 2024-06-11: qty 90, 30d supply, fill #1

## 2024-05-15 MED ORDER — PAROXETINE HCL 40 MG PO TABS
40.0000 mg | ORAL_TABLET | Freq: Every day | ORAL | 3 refills | Status: AC
  Filled 2024-05-15: qty 30, 30d supply, fill #0
  Filled 2024-06-11: qty 30, 30d supply, fill #1

## 2024-05-15 MED ORDER — HYDROCODONE-ACETAMINOPHEN 7.5-325 MG PO TABS
1.0000 | ORAL_TABLET | Freq: Four times a day (QID) | ORAL | 0 refills | Status: DC | PRN
  Filled 2024-05-15: qty 120, 30d supply, fill #0

## 2024-05-21 ENCOUNTER — Other Ambulatory Visit (HOSPITAL_COMMUNITY): Payer: Self-pay

## 2024-05-22 ENCOUNTER — Other Ambulatory Visit: Payer: Self-pay

## 2024-05-22 ENCOUNTER — Other Ambulatory Visit (HOSPITAL_COMMUNITY): Payer: Self-pay

## 2024-05-24 ENCOUNTER — Other Ambulatory Visit (HOSPITAL_COMMUNITY): Payer: Self-pay

## 2024-05-27 ENCOUNTER — Ambulatory Visit: Admitting: Physician Assistant

## 2024-05-27 ENCOUNTER — Encounter: Payer: Self-pay | Admitting: Physician Assistant

## 2024-05-27 ENCOUNTER — Other Ambulatory Visit: Payer: Self-pay

## 2024-05-27 ENCOUNTER — Other Ambulatory Visit (HOSPITAL_COMMUNITY): Payer: Self-pay

## 2024-05-27 DIAGNOSIS — M7062 Trochanteric bursitis, left hip: Secondary | ICD-10-CM

## 2024-05-27 DIAGNOSIS — S72142G Displaced intertrochanteric fracture of left femur, subsequent encounter for closed fracture with delayed healing: Secondary | ICD-10-CM

## 2024-05-27 MED ORDER — PREDNISONE 10 MG PO TABS
10.0000 mg | ORAL_TABLET | Freq: Every day | ORAL | 0 refills | Status: AC
  Filled 2024-05-27: qty 30, 30d supply, fill #0
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
## 2024-05-30 ENCOUNTER — Encounter: Payer: Self-pay | Admitting: Pharmacist

## 2024-05-30 NOTE — Progress Notes (Signed)
 Pharmacy Quality Measure Review  This patient is appearing on a report for being at risk of failing the adherence measure for cholesterol (statin) medications this calendar year.   Medication: atorvastatin  10 mg Last fill date: 04/19/24 for 90 day supply  Insurance report was not up to date. No action needed at this time.  Medication refilled Dec, picked up 12/15 x90 ds. 80%

## 2024-06-01 ENCOUNTER — Other Ambulatory Visit (HOSPITAL_COMMUNITY): Payer: Self-pay

## 2024-06-03 ENCOUNTER — Other Ambulatory Visit (HOSPITAL_COMMUNITY): Payer: Self-pay

## 2024-06-04 ENCOUNTER — Other Ambulatory Visit (HOSPITAL_COMMUNITY): Payer: Self-pay

## 2024-06-07 ENCOUNTER — Other Ambulatory Visit (HOSPITAL_COMMUNITY): Payer: Self-pay

## 2024-06-10 ENCOUNTER — Other Ambulatory Visit (HOSPITAL_COMMUNITY): Payer: Self-pay

## 2024-06-11 ENCOUNTER — Other Ambulatory Visit (HOSPITAL_COMMUNITY): Payer: Self-pay

## 2024-06-11 ENCOUNTER — Other Ambulatory Visit: Payer: Self-pay

## 2024-06-12 ENCOUNTER — Other Ambulatory Visit (HOSPITAL_COMMUNITY): Payer: Self-pay

## 2024-06-13 ENCOUNTER — Other Ambulatory Visit (HOSPITAL_COMMUNITY): Payer: Self-pay

## 2024-06-13 ENCOUNTER — Other Ambulatory Visit: Payer: Self-pay

## 2024-06-14 ENCOUNTER — Other Ambulatory Visit (HOSPITAL_COMMUNITY): Payer: Self-pay

## 2024-06-17 ENCOUNTER — Other Ambulatory Visit: Payer: Self-pay

## 2024-06-17 ENCOUNTER — Other Ambulatory Visit (HOSPITAL_COMMUNITY): Payer: Self-pay

## 2024-06-17 MED ORDER — HYDROCODONE-ACETAMINOPHEN 7.5-325 MG PO TABS
1.0000 | ORAL_TABLET | Freq: Four times a day (QID) | ORAL | 0 refills | Status: AC | PRN
  Filled 2024-06-17: qty 10, 3d supply, fill #0
  Filled 2024-06-17: qty 110, 27d supply, fill #0
  Filled 2024-06-17: qty 120, 30d supply, fill #0

## 2024-06-19 ENCOUNTER — Other Ambulatory Visit (HOSPITAL_COMMUNITY): Payer: Self-pay

## 2024-06-24 ENCOUNTER — Other Ambulatory Visit (HOSPITAL_COMMUNITY): Payer: Self-pay

## 2024-06-26 ENCOUNTER — Other Ambulatory Visit (HOSPITAL_COMMUNITY): Payer: Self-pay

## 2024-06-26 ENCOUNTER — Telehealth: Payer: Self-pay | Admitting: *Deleted

## 2024-06-26 DIAGNOSIS — M7062 Trochanteric bursitis, left hip: Secondary | ICD-10-CM

## 2024-06-26 DIAGNOSIS — S72142G Displaced intertrochanteric fracture of left femur, subsequent encounter for closed fracture with delayed healing: Secondary | ICD-10-CM

## 2024-06-26 MED ORDER — METHOCARBAMOL 500 MG PO TABS
500.0000 mg | ORAL_TABLET | Freq: Three times a day (TID) | ORAL | 2 refills | Status: AC | PRN
  Filled 2024-07-11: qty 90, 30d supply, fill #0

## 2024-06-26 MED ORDER — PAROXETINE HCL 40 MG PO TABS: 40.0000 mg | ORAL_TABLET | Freq: Every day | ORAL | 3 refills | Status: AC

## 2024-06-26 MED ORDER — HYDROCODONE-ACETAMINOPHEN 7.5-325 MG PO TABS: 1.0000 | ORAL_TABLET | Freq: Four times a day (QID) | ORAL | 0 refills | Status: AC | PRN

## 2024-06-26 MED ORDER — HYDROCODONE-ACETAMINOPHEN 7.5-325 MG PO TABS
1.0000 | ORAL_TABLET | Freq: Four times a day (QID) | ORAL | 0 refills | Status: AC | PRN
  Filled 2024-07-16: qty 120, 30d supply, fill #0

## 2024-06-26 NOTE — Telephone Encounter (Signed)
 Copied from CRM [id number]. Topic: Referral - Prior Authorization Question >> Jun 26, 2024 11:46 AM Miquel SAILOR wrote: Reason for CRM: PT requesting to put in Prior Auth thru new insurance on file for Referral on Orthopedic Need call back when updated  [phone]  [facility] Maurilio HERO, PA-C  [address]  [place] [address]

## 2024-06-26 NOTE — Telephone Encounter (Signed)
 Diagnostic codes added to referral.

## 2024-06-26 NOTE — Telephone Encounter (Signed)
 I need a specific reason for the referral.  I tried to guess but patient has multiple orthopedic issues possible past and present on her list. I am not sure what they are currently treating her for.

## 2024-07-01 ENCOUNTER — Other Ambulatory Visit: Payer: Self-pay

## 2024-07-01 ENCOUNTER — Ambulatory Visit: Admitting: Physician Assistant

## 2024-07-01 ENCOUNTER — Other Ambulatory Visit (HOSPITAL_COMMUNITY): Payer: Self-pay

## 2024-07-01 DIAGNOSIS — M5410 Radiculopathy, site unspecified: Secondary | ICD-10-CM | POA: Diagnosis not present

## 2024-07-01 DIAGNOSIS — M25552 Pain in left hip: Secondary | ICD-10-CM | POA: Diagnosis not present

## 2024-07-01 DIAGNOSIS — M4106 Infantile idiopathic scoliosis, lumbar region: Secondary | ICD-10-CM | POA: Diagnosis not present

## 2024-07-01 DIAGNOSIS — M160 Bilateral primary osteoarthritis of hip: Secondary | ICD-10-CM

## 2024-07-01 MED ORDER — GABAPENTIN 100 MG PO CAPS
100.0000 mg | ORAL_CAPSULE | Freq: Three times a day (TID) | ORAL | 1 refills | Status: AC
  Filled 2024-07-01: qty 90, 30d supply, fill #0
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
## 2024-07-03 ENCOUNTER — Other Ambulatory Visit (HOSPITAL_COMMUNITY): Payer: Self-pay

## 2024-07-04 ENCOUNTER — Encounter: Payer: Self-pay | Admitting: Physician Assistant

## 2024-07-05 ENCOUNTER — Other Ambulatory Visit (HOSPITAL_COMMUNITY): Payer: Self-pay

## 2024-07-11 ENCOUNTER — Other Ambulatory Visit (HOSPITAL_COMMUNITY): Payer: Self-pay

## 2024-07-12 ENCOUNTER — Other Ambulatory Visit (HOSPITAL_COMMUNITY): Payer: Self-pay

## 2024-07-15 ENCOUNTER — Other Ambulatory Visit (HOSPITAL_COMMUNITY): Payer: Self-pay

## 2024-07-16 ENCOUNTER — Other Ambulatory Visit (HOSPITAL_COMMUNITY): Payer: Self-pay

## 2024-07-18 ENCOUNTER — Other Ambulatory Visit (HOSPITAL_COMMUNITY): Payer: Self-pay

## 2024-07-19 ENCOUNTER — Other Ambulatory Visit (HOSPITAL_COMMUNITY): Payer: Self-pay
